# Patient Record
Sex: Male | Born: 1950
Health system: Southern US, Community
[De-identification: ages and names within clinical notes are randomized; demographics above are authoritative.]

## PROBLEM LIST (undated history)

## (undated) DIAGNOSIS — M1990 Unspecified osteoarthritis, unspecified site: Secondary | ICD-10-CM

## (undated) DIAGNOSIS — N200 Calculus of kidney: Secondary | ICD-10-CM

## (undated) DIAGNOSIS — Z9889 Other specified postprocedural states: Secondary | ICD-10-CM

## (undated) DIAGNOSIS — C61 Malignant neoplasm of prostate: Secondary | ICD-10-CM

## (undated) DIAGNOSIS — N181 Chronic kidney disease, stage 1: Secondary | ICD-10-CM

## (undated) DIAGNOSIS — E139 Other specified diabetes mellitus without complications: Secondary | ICD-10-CM

## (undated) DIAGNOSIS — C801 Malignant (primary) neoplasm, unspecified: Secondary | ICD-10-CM

## (undated) DIAGNOSIS — R112 Nausea with vomiting, unspecified: Secondary | ICD-10-CM

## (undated) DIAGNOSIS — Z978 Presence of other specified devices: Secondary | ICD-10-CM

## (undated) DIAGNOSIS — Z9641 Presence of insulin pump (external) (internal): Secondary | ICD-10-CM

## (undated) DIAGNOSIS — Z8551 Personal history of malignant neoplasm of bladder: Secondary | ICD-10-CM

## (undated) DIAGNOSIS — E785 Hyperlipidemia, unspecified: Secondary | ICD-10-CM

## (undated) DIAGNOSIS — M4802 Spinal stenosis, cervical region: Secondary | ICD-10-CM

## (undated) DIAGNOSIS — I10 Essential (primary) hypertension: Secondary | ICD-10-CM

## (undated) DIAGNOSIS — Z87442 Personal history of urinary calculi: Secondary | ICD-10-CM

## (undated) DIAGNOSIS — N401 Enlarged prostate with lower urinary tract symptoms: Secondary | ICD-10-CM

## (undated) DIAGNOSIS — N182 Chronic kidney disease, stage 2 (mild): Secondary | ICD-10-CM

## (undated) HISTORY — DX: Malignant (primary) neoplasm, unspecified: C80.1

## (undated) HISTORY — DX: Hyperlipidemia, unspecified: E78.5

## (undated) HISTORY — PX: EXTRACORPOREAL SHOCK WAVE LITHOTRIPSY: SHX1557

## (undated) HISTORY — PX: DENTAL SURGERY: SHX609

---

## 1973-07-18 HISTORY — PX: TONSILLECTOMY: SUR1361

## 1996-07-18 HISTORY — PX: TRANSURETHRAL RESECTION OF BLADDER TUMOR: SHX2575

## 1997-11-26 ENCOUNTER — Ambulatory Visit (HOSPITAL_COMMUNITY): Admission: RE | Admit: 1997-11-26 | Discharge: 1997-11-26 | Payer: Self-pay | Admitting: Urology

## 2002-09-20 ENCOUNTER — Ambulatory Visit (HOSPITAL_COMMUNITY): Admission: RE | Admit: 2002-09-20 | Discharge: 2002-09-20 | Payer: Self-pay | Admitting: Gastroenterology

## 2003-11-26 ENCOUNTER — Encounter: Admission: RE | Admit: 2003-11-26 | Discharge: 2004-02-24 | Payer: Self-pay | Admitting: Family Medicine

## 2004-08-27 ENCOUNTER — Ambulatory Visit: Payer: Self-pay | Admitting: Internal Medicine

## 2004-09-16 ENCOUNTER — Ambulatory Visit (HOSPITAL_COMMUNITY): Admission: RE | Admit: 2004-09-16 | Discharge: 2004-09-16 | Payer: Self-pay | Admitting: Urology

## 2005-02-28 ENCOUNTER — Ambulatory Visit (HOSPITAL_COMMUNITY): Admission: RE | Admit: 2005-02-28 | Discharge: 2005-02-28 | Payer: Self-pay | Admitting: Urology

## 2005-07-18 LAB — HM COLONOSCOPY

## 2007-03-01 ENCOUNTER — Ambulatory Visit (HOSPITAL_COMMUNITY): Admission: RE | Admit: 2007-03-01 | Discharge: 2007-03-01 | Payer: Self-pay | Admitting: Urology

## 2007-05-24 ENCOUNTER — Ambulatory Visit (HOSPITAL_COMMUNITY): Admission: RE | Admit: 2007-05-24 | Discharge: 2007-05-24 | Payer: Self-pay | Admitting: Urology

## 2007-11-08 ENCOUNTER — Ambulatory Visit (HOSPITAL_COMMUNITY): Admission: RE | Admit: 2007-11-08 | Discharge: 2007-11-08 | Payer: Self-pay | Admitting: Urology

## 2008-11-08 ENCOUNTER — Emergency Department (HOSPITAL_COMMUNITY): Admission: EM | Admit: 2008-11-08 | Discharge: 2008-11-08 | Payer: Self-pay | Admitting: Family Medicine

## 2009-03-12 LAB — PSA: PSA: 0.92

## 2009-07-04 ENCOUNTER — Emergency Department (HOSPITAL_COMMUNITY): Admission: EM | Admit: 2009-07-04 | Discharge: 2009-07-04 | Payer: Self-pay | Admitting: Emergency Medicine

## 2010-04-07 LAB — PSA: PSA: 1.23

## 2010-08-13 ENCOUNTER — Emergency Department (HOSPITAL_COMMUNITY)
Admission: EM | Admit: 2010-08-13 | Discharge: 2010-08-13 | Payer: Self-pay | Source: Home / Self Care | Admitting: Emergency Medicine

## 2010-08-13 LAB — BASIC METABOLIC PANEL
BUN: 9 mg/dL (ref 6–23)
CO2: 25 mEq/L (ref 19–32)
Calcium: 9.1 mg/dL (ref 8.4–10.5)
Chloride: 105 mEq/L (ref 96–112)
Creatinine, Ser: 0.78 mg/dL (ref 0.4–1.5)
GFR calc Af Amer: >60 mL/min (ref >60)
GFR calc non Af Amer: >60 mL/min (ref >60)
Glucose, Bld: 107 mg/dL — ABNORMAL HIGH (ref 70–99)
Potassium: 4.2 mEq/L (ref 3.5–5.1)
Sodium: 139 mEq/L (ref 135–145)

## 2010-08-13 LAB — DIFFERENTIAL
Basophils Absolute: 0.1 10*3/uL (ref 0.0–0.1)
Basophils Relative: 1 % (ref 0–1)
Eosinophils Absolute: 0.5 10*3/uL (ref 0.0–0.7)
Eosinophils Relative: 4 % (ref 0–5)
Lymphocytes Relative: 36 % (ref 12–46)
Lymphs Abs: 4.4 10*3/uL — ABNORMAL HIGH (ref 0.7–4.0)
Monocytes Absolute: 0.9 10*3/uL (ref 0.1–1.0)
Monocytes Relative: 8 % (ref 3–12)
Neutro Abs: 6.2 10*3/uL (ref 1.7–7.7)
Neutrophils Relative %: 51 % (ref 43–77)

## 2010-08-13 LAB — CBC
HCT: 42.8 % (ref 39.0–52.0)
Hemoglobin: 14.7 g/dL (ref 13.0–17.0)
MCH: 29.3 pg (ref 26.0–34.0)
MCHC: 34.3 g/dL (ref 30.0–36.0)
MCV: 85.3 fL (ref 78.0–100.0)
Platelets: 162 10*3/uL (ref 150–400)
RBC: 5.02 MIL/uL (ref 4.22–5.81)
RDW: 14.1 % (ref 11.5–15.5)
WBC: 12.1 10*3/uL — ABNORMAL HIGH (ref 4.0–10.5)

## 2010-08-16 LAB — WOUND CULTURE

## 2010-10-18 LAB — URINALYSIS, ROUTINE W REFLEX MICROSCOPIC
Bilirubin Urine: NEGATIVE
Glucose, UA: >1000 mg/dL — AB
Hgb urine dipstick: NEGATIVE
Ketones, ur: NEGATIVE mg/dL
Leukocytes, UA: NEGATIVE
Nitrite: NEGATIVE
Protein, ur: NEGATIVE mg/dL
Specific Gravity, Urine: 1.028 (ref 1.005–1.030)
Urobilinogen, UA: 1 mg/dL (ref 0.0–1.0)
pH: 8 (ref 5.0–8.0)

## 2010-10-18 LAB — CBC
HCT: 42.3 % (ref 39.0–52.0)
Hemoglobin: 14.3 g/dL (ref 13.0–17.0)
MCHC: 33.7 g/dL (ref 30.0–36.0)
MCV: 90.2 fL (ref 78.0–100.0)
Platelets: 151 10*3/uL (ref 150–400)
RBC: 4.69 MIL/uL (ref 4.22–5.81)
RDW: 14.8 % (ref 11.5–15.5)
WBC: 14.6 10*3/uL — ABNORMAL HIGH (ref 4.0–10.5)

## 2010-10-18 LAB — POCT I-STAT, CHEM 8
BUN: 17 mg/dL (ref 6–23)
Calcium, Ion: 1.21 mmol/L (ref 1.12–1.32)
Chloride: 105 mEq/L (ref 96–112)
Creatinine, Ser: 1.2 mg/dL (ref 0.4–1.5)
Glucose, Bld: 185 mg/dL — ABNORMAL HIGH (ref 70–99)
HCT: 45 % (ref 39.0–52.0)
Hemoglobin: 15.3 g/dL (ref 13.0–17.0)
Potassium: 3.9 mEq/L (ref 3.5–5.1)
Sodium: 141 mEq/L (ref 135–145)
TCO2: 29 mmol/L (ref 0–100)

## 2010-10-18 LAB — DIFFERENTIAL
Basophils Absolute: 0 10*3/uL (ref 0.0–0.1)
Basophils Relative: 0 % (ref 0–1)
Eosinophils Absolute: 0.1 10*3/uL (ref 0.0–0.7)
Eosinophils Relative: 1 % (ref 0–5)
Lymphocytes Relative: 5 % — ABNORMAL LOW (ref 12–46)
Lymphs Abs: 0.7 10*3/uL (ref 0.7–4.0)
Monocytes Absolute: 0.7 10*3/uL (ref 0.1–1.0)
Monocytes Relative: 5 % (ref 3–12)
Neutro Abs: 13.1 10*3/uL — ABNORMAL HIGH (ref 1.7–7.7)
Neutrophils Relative %: 90 % — ABNORMAL HIGH (ref 43–77)

## 2010-10-18 LAB — URINE MICROSCOPIC-ADD ON

## 2010-11-18 ENCOUNTER — Ambulatory Visit (HOSPITAL_COMMUNITY)
Admission: RE | Admit: 2010-11-18 | Discharge: 2010-11-18 | Disposition: A | Discharge status: Home / Self Care | Payer: BC Managed Care – PPO | Source: Ambulatory Visit | Attending: Urology | Admitting: Urology

## 2010-11-18 ENCOUNTER — Encounter (HOSPITAL_COMMUNITY): Payer: Self-pay

## 2010-11-18 DIAGNOSIS — N200 Calculus of kidney: Secondary | ICD-10-CM | POA: Insufficient documentation

## 2010-11-18 DIAGNOSIS — I10 Essential (primary) hypertension: Secondary | ICD-10-CM | POA: Insufficient documentation

## 2010-11-18 DIAGNOSIS — F17200 Nicotine dependence, unspecified, uncomplicated: Secondary | ICD-10-CM | POA: Insufficient documentation

## 2010-11-18 DIAGNOSIS — Z79899 Other long term (current) drug therapy: Secondary | ICD-10-CM | POA: Insufficient documentation

## 2010-11-18 DIAGNOSIS — E119 Type 2 diabetes mellitus without complications: Secondary | ICD-10-CM | POA: Insufficient documentation

## 2010-11-18 LAB — GLUCOSE, CAPILLARY: Glucose-Capillary: 211 mg/dL — ABNORMAL HIGH (ref 70–99)

## 2010-11-18 LAB — BASIC METABOLIC PANEL
BUN: 10 mg/dL (ref 6–23)
CO2: 26 mEq/L (ref 19–32)
Calcium: 8.9 mg/dL (ref 8.4–10.5)
Chloride: 100 mEq/L (ref 96–112)
Creatinine, Ser: 0.83 mg/dL (ref 0.4–1.5)
GFR calc Af Amer: >60 mL/min (ref >60)
GFR calc non Af Amer: >60 mL/min (ref >60)
Glucose, Bld: 213 mg/dL — ABNORMAL HIGH (ref 70–99)
Potassium: 3.7 mEq/L (ref 3.5–5.1)
Sodium: 135 mEq/L (ref 135–145)

## 2010-11-18 LAB — CBC
HCT: 41.2 % (ref 39.0–52.0)
Hemoglobin: 14.1 g/dL (ref 13.0–17.0)
MCH: 29.2 pg (ref 26.0–34.0)
MCHC: 34.2 g/dL (ref 30.0–36.0)
MCV: 85.3 fL (ref 78.0–100.0)
Platelets: 155 10*3/uL (ref 150–400)
RBC: 4.83 MIL/uL (ref 4.22–5.81)
RDW: 14.1 % (ref 11.5–15.5)
WBC: 10.4 10*3/uL (ref 4.0–10.5)

## 2010-12-23 ENCOUNTER — Ambulatory Visit (HOSPITAL_COMMUNITY)
Admission: RE | Admit: 2010-12-23 | Discharge: 2010-12-23 | Disposition: A | Discharge status: Home / Self Care | Payer: BC Managed Care – PPO | Source: Ambulatory Visit | Attending: Urology | Admitting: Urology

## 2010-12-23 DIAGNOSIS — E119 Type 2 diabetes mellitus without complications: Secondary | ICD-10-CM | POA: Insufficient documentation

## 2010-12-23 DIAGNOSIS — Z79899 Other long term (current) drug therapy: Secondary | ICD-10-CM | POA: Insufficient documentation

## 2010-12-23 DIAGNOSIS — N200 Calculus of kidney: Secondary | ICD-10-CM | POA: Insufficient documentation

## 2010-12-23 LAB — BASIC METABOLIC PANEL
BUN: 10 mg/dL (ref 6–23)
CO2: 26 mEq/L (ref 19–32)
Calcium: 8.9 mg/dL (ref 8.4–10.5)
Chloride: 103 mEq/L (ref 96–112)
Creatinine, Ser: 0.82 mg/dL (ref 0.4–1.5)
GFR calc Af Amer: >60 mL/min (ref >60)
GFR calc non Af Amer: >60 mL/min (ref >60)
Glucose, Bld: 324 mg/dL — ABNORMAL HIGH (ref 70–99)
Potassium: 4.3 mEq/L (ref 3.5–5.1)
Sodium: 135 mEq/L (ref 135–145)

## 2010-12-23 LAB — CBC
HCT: 41.3 % (ref 39.0–52.0)
Hemoglobin: 13.4 g/dL (ref 13.0–17.0)
MCH: 28.2 pg (ref 26.0–34.0)
MCHC: 32.4 g/dL (ref 30.0–36.0)
MCV: 86.8 fL (ref 78.0–100.0)
Platelets: 148 10*3/uL — ABNORMAL LOW (ref 150–400)
RBC: 4.76 MIL/uL (ref 4.22–5.81)
RDW: 14.3 % (ref 11.5–15.5)
WBC: 9.7 10*3/uL (ref 4.0–10.5)

## 2010-12-23 LAB — GLUCOSE, CAPILLARY: Glucose-Capillary: 297 mg/dL — ABNORMAL HIGH (ref 70–99)

## 2011-04-12 LAB — URINALYSIS, ROUTINE W REFLEX MICROSCOPIC
Bilirubin Urine: NEGATIVE
Glucose, UA: >1000 — AB
Hgb urine dipstick: NEGATIVE
Ketones, ur: NEGATIVE
Leukocytes, UA: NEGATIVE
Nitrite: NEGATIVE
Protein, ur: NEGATIVE
Specific Gravity, Urine: 1.026
Urobilinogen, UA: 0.2
pH: 6

## 2011-04-12 LAB — URINE MICROSCOPIC-ADD ON

## 2011-04-12 LAB — CBC
HCT: 40.3
Hemoglobin: 13.5
MCHC: 33.5
MCV: 90
Platelets: 147 — ABNORMAL LOW
RBC: 4.48
RDW: 15.2
WBC: 8

## 2011-04-12 LAB — BASIC METABOLIC PANEL
BUN: 10
CO2: 28
Calcium: 8.4
Chloride: 103
Creatinine, Ser: 0.99
GFR calc Af Amer: >60
GFR calc non Af Amer: >60
Glucose, Bld: 340 — ABNORMAL HIGH
Potassium: 4.7
Sodium: 134 — ABNORMAL LOW

## 2011-04-26 LAB — URINALYSIS, ROUTINE W REFLEX MICROSCOPIC
Bilirubin Urine: NEGATIVE
Glucose, UA: NEGATIVE
Ketones, ur: NEGATIVE
Leukocytes, UA: NEGATIVE
Nitrite: NEGATIVE
Protein, ur: NEGATIVE
Specific Gravity, Urine: 1.021
Urobilinogen, UA: 0.2
pH: 5.5

## 2011-04-26 LAB — CBC
HCT: 40.5
Hemoglobin: 13.7
MCHC: 33.9
MCV: 89.3
Platelets: 156
RBC: 4.53
RDW: 15 — ABNORMAL HIGH
WBC: 9

## 2011-04-26 LAB — URINE MICROSCOPIC-ADD ON

## 2011-04-26 LAB — BASIC METABOLIC PANEL
BUN: 11
CO2: 25
Calcium: 8.6
Chloride: 103
Creatinine, Ser: 0.89
GFR calc Af Amer: >60
GFR calc non Af Amer: >60
Glucose, Bld: 150 — ABNORMAL HIGH
Potassium: 4
Sodium: 136

## 2011-05-02 LAB — BASIC METABOLIC PANEL
BUN: 9
CO2: 27
Calcium: 9
Chloride: 110
Creatinine, Ser: 0.89
GFR calc Af Amer: >60
GFR calc non Af Amer: >60
Glucose, Bld: 113 — ABNORMAL HIGH
Potassium: 3.7
Sodium: 142

## 2011-05-02 LAB — URINALYSIS, MICROSCOPIC ONLY
Glucose, UA: 100 — AB
Nitrite: NEGATIVE
Protein, ur: NEGATIVE
Specific Gravity, Urine: 1.025
Urobilinogen, UA: 1
pH: 5.5

## 2011-05-02 LAB — CBC
HCT: 41.3
Hemoglobin: 14.1
MCHC: 34.1
MCV: 88.5
Platelets: 169
RBC: 4.67
RDW: 14.9 — ABNORMAL HIGH
WBC: 9.6

## 2011-08-03 LAB — HM DIABETES EYE EXAM: HM Diabetic Eye Exam: NORMAL

## 2011-09-16 ENCOUNTER — Other Ambulatory Visit (HOSPITAL_COMMUNITY): Payer: Self-pay | Admitting: Gynecology

## 2011-10-07 LAB — PSA: PSA: 1.54

## 2012-05-02 LAB — HM DIABETES FOOT EXAM

## 2012-06-26 ENCOUNTER — Encounter: Payer: Self-pay | Admitting: Internal Medicine

## 2012-06-26 ENCOUNTER — Ambulatory Visit (INDEPENDENT_AMBULATORY_CARE_PROVIDER_SITE_OTHER): Payer: BC Managed Care – PPO | Admitting: Internal Medicine

## 2012-06-26 ENCOUNTER — Other Ambulatory Visit (INDEPENDENT_AMBULATORY_CARE_PROVIDER_SITE_OTHER): Payer: BC Managed Care – PPO

## 2012-06-26 VITALS — BP 130/72 | HR 75 | Temp 98.7°F | Resp 16 | Ht 73.0 in | Wt 247.5 lb

## 2012-06-26 DIAGNOSIS — E119 Type 2 diabetes mellitus without complications: Secondary | ICD-10-CM

## 2012-06-26 DIAGNOSIS — Z0000 Encounter for general adult medical examination without abnormal findings: Secondary | ICD-10-CM

## 2012-06-26 DIAGNOSIS — E139 Other specified diabetes mellitus without complications: Secondary | ICD-10-CM

## 2012-06-26 DIAGNOSIS — Z23 Encounter for immunization: Secondary | ICD-10-CM

## 2012-06-26 LAB — PSA: PSA: 1.24 ng/mL (ref 0.10–4.00)

## 2012-06-26 LAB — COMPREHENSIVE METABOLIC PANEL
ALT: 23 U/L (ref 0–53)
AST: 19 U/L (ref 0–37)
Albumin: 3.7 g/dL (ref 3.5–5.2)
Alkaline Phosphatase: 108 U/L (ref 39–117)
BUN: 12 mg/dL (ref 6–23)
CO2: 26 mEq/L (ref 19–32)
Calcium: 8.8 mg/dL (ref 8.4–10.5)
Chloride: 105 mEq/L (ref 96–112)
Creatinine, Ser: 0.9 mg/dL (ref 0.4–1.5)
GFR: 116.74 mL/min (ref >60.00)
Glucose, Bld: 130 mg/dL — ABNORMAL HIGH (ref 70–99)
Potassium: 4.3 mEq/L (ref 3.5–5.1)
Sodium: 136 mEq/L (ref 135–145)
Total Bilirubin: 0.6 mg/dL (ref 0.3–1.2)
Total Protein: 6.8 g/dL (ref 6.0–8.3)

## 2012-06-26 LAB — URINALYSIS, ROUTINE W REFLEX MICROSCOPIC
Bilirubin Urine: NEGATIVE
Ketones, ur: NEGATIVE
Leukocytes, UA: NEGATIVE
Nitrite: NEGATIVE
Specific Gravity, Urine: 1.02 (ref 1.000–1.030)
Total Protein, Urine: NEGATIVE
Urine Glucose: NEGATIVE
Urobilinogen, UA: 0.2 (ref 0.0–1.0)
pH: 6 (ref 5.0–8.0)

## 2012-06-26 LAB — HEPATITIS B SURFACE ANTIBODY,QUALITATIVE: Hep B S Ab: NONREACTIVE

## 2012-06-26 LAB — CBC WITH DIFFERENTIAL/PLATELET
Basophils Absolute: 0.1 10*3/uL (ref 0.0–0.1)
Basophils Relative: 0.7 % (ref 0.0–3.0)
Eosinophils Absolute: 0.6 10*3/uL (ref 0.0–0.7)
Eosinophils Relative: 5.9 % — ABNORMAL HIGH (ref 0.0–5.0)
HCT: 43 % (ref 39.0–52.0)
Hemoglobin: 14.1 g/dL (ref 13.0–17.0)
Lymphocytes Relative: 26 % (ref 12.0–46.0)
Lymphs Abs: 2.6 10*3/uL (ref 0.7–4.0)
MCHC: 32.7 g/dL (ref 30.0–36.0)
MCV: 89.7 fl (ref 78.0–100.0)
Monocytes Absolute: 0.6 10*3/uL (ref 0.1–1.0)
Monocytes Relative: 6.5 % (ref 3.0–12.0)
Neutro Abs: 6.1 10*3/uL (ref 1.4–7.7)
Neutrophils Relative %: 60.9 % (ref 43.0–77.0)
Platelets: 179 10*3/uL (ref 150.0–400.0)
RBC: 4.8 Mil/uL (ref 4.22–5.81)
RDW: 14.8 % — ABNORMAL HIGH (ref 11.5–14.6)
WBC: 10 10*3/uL (ref 4.5–10.5)

## 2012-06-26 LAB — LIPID PANEL
Cholesterol: 169 mg/dL (ref 0–200)
HDL: 40.2 mg/dL (ref >39.00)
LDL Cholesterol: 112 mg/dL — ABNORMAL HIGH (ref 0–99)
Total CHOL/HDL Ratio: 4
Triglycerides: 84 mg/dL (ref 0.0–149.0)
VLDL: 16.8 mg/dL (ref 0.0–40.0)

## 2012-06-26 LAB — FECAL OCCULT BLOOD, GUAIAC: Fecal Occult Blood: NEGATIVE

## 2012-06-26 LAB — HEMOGLOBIN A1C: Hgb A1c MFr Bld: 8.8 % — ABNORMAL HIGH (ref 4.6–6.5)

## 2012-06-26 LAB — HM DIABETES FOOT EXAM: HM Diabetic Foot Exam: NORMAL

## 2012-06-26 LAB — TSH: TSH: 0.64 u[IU]/mL (ref 0.35–5.50)

## 2012-06-26 LAB — C-PEPTIDE: C-Peptide: <0.1 ng/mL — ABNORMAL LOW (ref 0.80–3.90)

## 2012-06-26 NOTE — Assessment & Plan Note (Signed)
I have referred him for an eye exam I will check his a1c, c-peptide level, and renal function today He will not take a statin due to muscle aches from prior statin therapy

## 2012-06-26 NOTE — Patient Instructions (Signed)
Diabetes, Type 1 Diabetes is a long-term (chronic) disease. It occurs when the cells in the pancreas that make insulin (a hormone) are destroyed and can no longer make insulin. Type 1 diabetes was also previously called juvenile-onset diabetes. It most often occurs before the age of 33, but it can also occur in older people. CAUSES  Among other factors, the following may cause type 1 diabetes:  Genetics. This means it may be passed to you by your parents.  The beta cells that make insulin are destroyed. The cause of this is unknown. SYMPTOMS   Urinating more than usual (or bed-wetting in children).  Drinking more than usual.  Irritability.  Feeling very hungry.  Weight loss (may be rapid).  Nausea and vomiting.  Abdominal pain.  Feeling more tired than usual (fatigue).  Rapid breathing.  Difficulty staying awake.  Night sweats. DIAGNOSIS  Your blood is tested to determine whether you have type 1 diabetes. TREATMENT   You will need to check your blood glucose (sugar) levels several times a day.  You will need to balance insulin, a healthy meal plan, and exercise to maintain normal blood glucose.  Education and ongoing support is recommended.  You should have regular checkups and immunizations. HOME CARE INSTRUCTIONS   Never run out of insulin. It is needed every day.  Do not skip insulin doses.  Do not skip meals. Eat healthy.  Follow your treatment and monitoring plan.  Wear a pendant or bracelet stating you have diabetes and take insulin.  If you start a new exercise or sport, watch for low blood glucose (hypoglycemia) symptoms. Insulin dosing may need to be adjusted.  Follow up with your caregiver regularly.  Tell your workplace or school about your diabetes treatment plan. SEEK MEDICAL CARE IF:   You have problems keeping your blood glucose in target range.  You have blood glucose readings that are often too high or too low.  You have problems with  your medicines.  You have symptoms of an illness that do not improve after 24 hours.  You have a sore or wound that is not healing.  You notice a change in vision or a new problem with your vision.  You have a fever. MAKE SURE YOU:  Understand these instructions.  Will watch your condition.  Will get help right away if you are not doing well or get worse. Document Released: 07/01/2000 Document Revised: 09/26/2011 Document Reviewed: 12/20/2010 [facility] Patient Information 2013 [place], [place]. Health Maintenance, Males A healthy lifestyle and preventative care can promote health and wellness.  Maintain regular health, dental, and eye exams.  Eat a healthy diet. Foods like vegetables, fruits, whole grains, low-fat dairy products, and lean protein foods contain the nutrients you need without too many calories. Decrease your intake of foods high in solid fats, added sugars, and salt. Get information about a proper diet from your caregiver, if necessary.  Regular physical exercise is one of the most important things you can do for your health. Most adults should get at least 150 minutes of moderate-intensity exercise (any activity that increases your heart rate and causes you to sweat) each week. In addition, most adults need muscle-strengthening exercises on 2 or more days a week.   Maintain a healthy weight. The body mass index (BMI) is a screening tool to identify possible weight problems. It provides an estimate of body fat based on height and weight. Your caregiver can help determine your BMI, and can help you achieve or maintain  a healthy weight. For adults 20 years and older:  A BMI below 18.5 is considered underweight.  A BMI of 18.5 to 24.9 is normal.  A BMI of 25 to 29.9 is considered overweight.  A BMI of 30 and above is considered obese.  Maintain normal blood lipids and cholesterol by exercising and minimizing your intake of saturated fat. Eat a balanced diet with plenty  of fruits and vegetables. Blood tests for lipids and cholesterol should begin at age 21 and be repeated every 5 years. If your lipid or cholesterol levels are high, you are over 50, or you are a high risk for heart disease, you may need your cholesterol levels checked more frequently.Ongoing high lipid and cholesterol levels should be treated with medicines, if diet and exercise are not effective.  If you smoke, find out from your caregiver how to quit. If you do not use tobacco, do not start.  If you choose to drink alcohol, do not exceed 2 drinks per day. One drink is considered to be 12 ounces (355 mL) of beer, 5 ounces (148 mL) of wine, or 1.5 ounces (44 mL) of liquor.  Avoid use of street drugs. Do not share needles with anyone. Ask for help if you need support or instructions about stopping the use of drugs.  High blood pressure causes heart disease and increases the risk of stroke. Blood pressure should be checked at least every 1 to 2 years. Ongoing high blood pressure should be treated with medicines if weight loss and exercise are not effective.  If you are 38 to 59 years old, ask your caregiver if you should take aspirin to prevent heart disease.  Diabetes screening involves taking a blood sample to check your fasting blood sugar level. This should be done once every 3 years, after age 38, if you are within normal weight and without risk factors for diabetes. Testing should be considered at a younger age or be carried out more frequently if you are overweight and have at least 1 risk factor for diabetes.  Colorectal cancer can be detected and often prevented. Most routine colorectal cancer screening begins at the age of 16 and continues through age 56. However, your caregiver may recommend screening at an earlier age if you have risk factors for colon cancer. On a yearly basis, your caregiver may provide home test kits to check for hidden blood in the stool. Use of a small camera at the end  of a tube, to directly examine the colon (sigmoidoscopy or colonoscopy), can detect the earliest forms of colorectal cancer. Talk to your caregiver about this at age 62, when routine screening begins. Direct examination of the colon should be repeated every 5 to 10 years through age 40, unless early forms of pre-cancerous polyps or small growths are found.  Hepatitis C blood testing is recommended for all people born from [birth year] through 1963 and any individual with known risks for hepatitis C.  Healthy men should no longer receive prostate-specific antigen (PSA) blood tests as part of routine cancer screening. Consult with your caregiver about prostate cancer screening.  Testicular cancer screening is not recommended for adolescents or adult males who have no symptoms. Screening includes self-exam, caregiver exam, and other screening tests. Consult with your caregiver about any symptoms you have or any concerns you have about testicular cancer.  Practice safe sex. Use condoms and avoid high-risk sexual practices to reduce the spread of sexually transmitted infections (STIs).  Use sunscreen with a sun  protection factor (SPF) of 30 or greater. Apply sunscreen liberally and repeatedly throughout the day. You should seek shade when your shadow is shorter than you. Protect yourself by wearing long sleeves, pants, a wide-brimmed hat, and sunglasses year round, whenever you are outdoors.  Notify your caregiver of new moles or changes in moles, especially if there is a change in shape or color. Also notify your caregiver if a mole is larger than the size of a pencil eraser.  A one-time screening for abdominal aortic aneurysm (AAA) and surgical repair of large AAAs by sound wave imaging (ultrasonography) is recommended for ages 71 to 78 years who are current or former smokers.  Stay current with your immunizations. Document Released: 12/31/2007 Document Revised: 09/26/2011 Document Reviewed:  11/29/2010 [facility] Patient Information 2013 [place], [place].

## 2012-06-26 NOTE — Progress Notes (Signed)
Subjective:    [name], male    DOB: 12-15-1950, 61 y.o.   MRN: [id number]  Diabetes He presents for his follow-up diabetic visit. He has type 1 diabetes mellitus. The initial diagnosis of diabetes was made 15 years ago. His disease course has been stable. There are no hypoglycemic associated symptoms. Pertinent negatives for hypoglycemia include no pallor. Pertinent negatives for diabetes include no blurred vision, no chest pain, no fatigue, no foot paresthesias, no foot ulcerations, no polydipsia, no polyphagia, no polyuria, no visual change, no weakness and no weight loss. There are no hypoglycemic complications. Symptoms are stable. Diabetic complications include impotence. Current diabetic treatment includes intensive insulin program and insulin injections. His weight is stable. He is following a generally healthy diet. Meal planning includes avoidance of concentrated sweets. He has not had a previous visit with a dietician. He participates in exercise intermittently. There is no change in his home blood glucose trend. An ACE inhibitor/angiotensin II receptor blocker is being taken. He does not see a podiatrist.Eye exam is current.      Review of Systems  Constitutional: Negative for fever, chills, weight loss, diaphoresis, activity change, appetite change, fatigue and unexpected weight change.  HENT: Negative.   Eyes: Negative.  Negative for blurred vision.  Respiratory: Negative for cough, chest tightness, shortness of breath, wheezing and stridor.   Cardiovascular: Negative for chest pain, palpitations and leg swelling.  Gastrointestinal: Negative for nausea, vomiting, abdominal pain, diarrhea, constipation and blood in stool.  Genitourinary: Positive for impotence. Negative for polyuria.  Musculoskeletal: Negative for myalgias, back pain, joint swelling, arthralgias and gait problem.  Skin: Negative for color change, pallor, rash and wound.  Neurological: Negative.   Negative for weakness.  Hematological: Negative for polydipsia, polyphagia and adenopathy. Does not bruise/bleed easily.  Psychiatric/Behavioral: Negative.        Objective:   Physical Exam  Vitals reviewed. Constitutional: He is oriented to person, place, and time. He appears well-developed and well-nourished. No distress.  HENT:  Head: Normocephalic and atraumatic.  Mouth/Throat: Oropharynx is clear and moist. No oropharyngeal exudate.  Eyes: Conjunctivae normal are normal. Right eye exhibits no discharge. Left eye exhibits no discharge. No scleral icterus.  Neck: Normal range of motion. Neck supple. No JVD present. No tracheal deviation present. No thyromegaly present.  Cardiovascular: Normal rate, regular rhythm and normal heart sounds.  Exam reveals no gallop and no friction rub.   No murmur heard. Pulmonary/Chest: Effort normal and breath sounds normal. No stridor. No respiratory distress. He has no wheezes. He has no rales. He exhibits no tenderness.  Abdominal: Soft. Bowel sounds are normal. He exhibits no distension and no mass. There is no tenderness. There is no rebound and no guarding. Hernia confirmed negative in the right inguinal area and confirmed negative in the left inguinal area.  Genitourinary: Prostate normal, testes normal and penis normal. Rectal exam shows external hemorrhoid and internal hemorrhoid. Rectal exam shows no fissure, no mass, no tenderness and anal tone normal. Guaiac negative stool. Prostate is not enlarged and not tender. Right testis shows no mass, no swelling and no tenderness. Right testis is descended. Left testis shows no mass, no swelling and no tenderness. Left testis is descended. Circumcised. No penile erythema or penile tenderness. No discharge found.  Musculoskeletal: Normal range of motion. He exhibits no edema and no tenderness.  Lymphadenopathy:    He has no cervical adenopathy.       Right: No inguinal adenopathy present.  Left: No  inguinal adenopathy present.  Neurological: He is alert and oriented to person, place, and time. He has normal reflexes. He displays normal reflexes. No cranial nerve deficit. He exhibits normal muscle tone. Coordination normal.  Skin: Skin is warm and dry. No rash noted. He is not diaphoretic. No erythema. No pallor.  Psychiatric: He has a normal mood and affect. His behavior is normal. Judgment and thought content normal.     Lab Results  Component Value Date   WBC 9.7 12/23/2010   HGB 13.4 12/23/2010   HCT 41.3 12/23/2010   PLT 148* 12/23/2010   GLUCOSE 324* 12/23/2010   NA 135 12/23/2010   K 4.3 12/23/2010   CL 103 12/23/2010   CREATININE 0.82 12/23/2010   BUN 10 12/23/2010   CO2 26 12/23/2010       Assessment & Plan:

## 2012-06-26 NOTE — Assessment & Plan Note (Signed)
Exam done He wants to be screened for Hep A and Hep B and possibly vaccinated if necessary Vaccines were reviewed and given as indicated Labs ordered Pt ed material was given

## 2012-06-27 LAB — HEPATITIS A ANTIBODY, TOTAL: Hep A Total Ab: NEGATIVE

## 2012-06-28 ENCOUNTER — Encounter: Payer: Self-pay | Admitting: Internal Medicine

## 2012-08-25 ENCOUNTER — Emergency Department (HOSPITAL_COMMUNITY)
Admission: EM | Admit: 2012-08-25 | Discharge: 2012-08-25 | Disposition: A | Discharge status: Home / Self Care | Payer: BC Managed Care – PPO | Attending: Emergency Medicine | Admitting: Emergency Medicine

## 2012-08-25 ENCOUNTER — Encounter (HOSPITAL_COMMUNITY): Payer: Self-pay | Admitting: *Deleted

## 2012-08-25 ENCOUNTER — Emergency Department (HOSPITAL_COMMUNITY): Payer: BC Managed Care – PPO

## 2012-08-25 DIAGNOSIS — Z8639 Personal history of other endocrine, nutritional and metabolic disease: Secondary | ICD-10-CM | POA: Insufficient documentation

## 2012-08-25 DIAGNOSIS — Z794 Long term (current) use of insulin: Secondary | ICD-10-CM | POA: Insufficient documentation

## 2012-08-25 DIAGNOSIS — K388 Other specified diseases of appendix: Secondary | ICD-10-CM

## 2012-08-25 DIAGNOSIS — Z7982 Long term (current) use of aspirin: Secondary | ICD-10-CM | POA: Insufficient documentation

## 2012-08-25 DIAGNOSIS — Z862 Personal history of diseases of the blood and blood-forming organs and certain disorders involving the immune mechanism: Secondary | ICD-10-CM | POA: Insufficient documentation

## 2012-08-25 DIAGNOSIS — N200 Calculus of kidney: Secondary | ICD-10-CM

## 2012-08-25 DIAGNOSIS — K389 Disease of appendix, unspecified: Secondary | ICD-10-CM | POA: Insufficient documentation

## 2012-08-25 DIAGNOSIS — Z8551 Personal history of malignant neoplasm of bladder: Secondary | ICD-10-CM | POA: Insufficient documentation

## 2012-08-25 DIAGNOSIS — Z79899 Other long term (current) drug therapy: Secondary | ICD-10-CM | POA: Insufficient documentation

## 2012-08-25 DIAGNOSIS — F17200 Nicotine dependence, unspecified, uncomplicated: Secondary | ICD-10-CM | POA: Insufficient documentation

## 2012-08-25 DIAGNOSIS — E119 Type 2 diabetes mellitus without complications: Secondary | ICD-10-CM | POA: Insufficient documentation

## 2012-08-25 LAB — URINALYSIS, ROUTINE W REFLEX MICROSCOPIC
Bilirubin Urine: NEGATIVE
Glucose, UA: 100 mg/dL — AB
Hgb urine dipstick: NEGATIVE
Ketones, ur: >80 mg/dL — AB
Leukocytes, UA: NEGATIVE
Nitrite: NEGATIVE
Protein, ur: 100 mg/dL — AB
Specific Gravity, Urine: 1.024 (ref 1.005–1.030)
Urobilinogen, UA: 0.2 mg/dL (ref 0.0–1.0)
pH: 6.5 (ref 5.0–8.0)

## 2012-08-25 LAB — COMPREHENSIVE METABOLIC PANEL
ALT: 15 U/L (ref 0–53)
AST: 13 U/L (ref 0–37)
Albumin: 3.6 g/dL (ref 3.5–5.2)
Alkaline Phosphatase: 114 U/L (ref 39–117)
BUN: 11 mg/dL (ref 6–23)
CO2: 24 mEq/L (ref 19–32)
Calcium: 8.6 mg/dL (ref 8.4–10.5)
Chloride: 100 mEq/L (ref 96–112)
Creatinine, Ser: 0.8 mg/dL (ref 0.50–1.35)
GFR calc Af Amer: >90 mL/min (ref >90)
GFR calc non Af Amer: >90 mL/min (ref >90)
Glucose, Bld: 117 mg/dL — ABNORMAL HIGH (ref 70–99)
Potassium: 4.2 mEq/L (ref 3.5–5.1)
Sodium: 135 mEq/L (ref 135–145)
Total Bilirubin: 0.4 mg/dL (ref 0.3–1.2)
Total Protein: 7.2 g/dL (ref 6.0–8.3)

## 2012-08-25 LAB — CBC WITH DIFFERENTIAL/PLATELET
Basophils Absolute: 0 10*3/uL (ref 0.0–0.1)
Basophils Relative: 0 % (ref 0–1)
Eosinophils Absolute: 0 10*3/uL (ref 0.0–0.7)
Eosinophils Relative: 0 % (ref 0–5)
HCT: 43.9 % (ref 39.0–52.0)
Hemoglobin: 15.2 g/dL (ref 13.0–17.0)
Lymphocytes Relative: 14 % (ref 12–46)
Lymphs Abs: 1.8 10*3/uL (ref 0.7–4.0)
MCH: 29.8 pg (ref 26.0–34.0)
MCHC: 34.6 g/dL (ref 30.0–36.0)
MCV: 86.1 fL (ref 78.0–100.0)
Monocytes Absolute: 0.5 10*3/uL (ref 0.1–1.0)
Monocytes Relative: 4 % (ref 3–12)
Neutro Abs: 11.1 10*3/uL — ABNORMAL HIGH (ref 1.7–7.7)
Neutrophils Relative %: 82 % — ABNORMAL HIGH (ref 43–77)
Platelets: 172 10*3/uL (ref 150–400)
RBC: 5.1 MIL/uL (ref 4.22–5.81)
RDW: 14 % (ref 11.5–15.5)
WBC: 13.4 10*3/uL — ABNORMAL HIGH (ref 4.0–10.5)

## 2012-08-25 LAB — URINE MICROSCOPIC-ADD ON

## 2012-08-25 LAB — GLUCOSE, CAPILLARY: Glucose-Capillary: 92 mg/dL (ref 70–99)

## 2012-08-25 MED ORDER — SODIUM CHLORIDE 0.9 % IV BOLUS (SEPSIS)
1000.0000 mL | Freq: Once | INTRAVENOUS | Status: AC
  Administered 2012-08-25: 1000 mL via INTRAVENOUS

## 2012-08-25 MED ORDER — OXYCODONE-ACETAMINOPHEN 5-325 MG PO TABS
2.0000 | ORAL_TABLET | Freq: Once | ORAL | Status: AC
  Administered 2012-08-25: 2 via ORAL

## 2012-08-25 MED ORDER — MORPHINE SULFATE 4 MG/ML IJ SOLN
4.0000 mg | Freq: Once | INTRAMUSCULAR | Status: AC
  Administered 2012-08-25: 4 mg via INTRAVENOUS
  Filled 2012-08-25: qty 1

## 2012-08-25 MED ORDER — OXYCODONE-ACETAMINOPHEN 5-325 MG PO TABS: 2.0000 | ORAL_TABLET | ORAL | Status: DC | PRN

## 2012-08-25 MED ORDER — OXYCODONE-ACETAMINOPHEN 5-325 MG PO TABS
ORAL_TABLET | ORAL | Status: AC
  Filled 2012-08-25: qty 2

## 2012-08-25 NOTE — ED Provider Notes (Signed)
History     CSN: [id number]  Arrival date & time 08/25/12  1626   First MD Initiated Contact with Patient 08/25/12 1712      Chief Complaint  Patient presents with  . Flank Pain    (Consider location/radiation/quality/duration/timing/severity/associated sxs/prior treatment) The history is provided by the patient.  Anthony Daniel is a 62 y.o. male hx of DM, HL, kidney stones here with ab pain. LLQ pain that is sharp with acute onset at 5 am that woke him up from sleep. No flank pain. No dysuria or hematuria. Pain is intermittent since then. Took hydrocodone without improvement. + nausea. No constipation or diarrhea. He said that it is slightly different than his kidney stones, which are usually flank pain.    Past Medical History  Diagnosis Date  . Diabetes mellitus without complication   . Hyperlipidemia   . Cancer 1996    bladder cancer  . Kidney stones     Past Surgical History  Procedure Laterality Date  . Tonsillectomy  1975  . Bladder surgery      Family History  Problem Relation Age of Onset  . Cancer Other     Colon and Prostate Cancer  . Hypertension Other   . Diabetes Other   . Alcohol abuse Neg Hx   . Drug abuse Neg Hx   . Early death Neg Hx   . Heart disease Neg Hx   . Hyperlipidemia Neg Hx   . Kidney disease Neg Hx   . Stroke Neg Hx     History  Substance Use Topics  . Smoking status: Current Every Day Smoker -- 1.00 packs/day for 30 years    Types: Cigarettes  . Smokeless tobacco: Never Used  . Alcohol Use: 3.0 oz/week    5 Shots of liquor per week      Review of Systems  Gastrointestinal: Positive for abdominal pain.    Allergies  Crestor; Lipitor; and Contrast media  Home Medications   Current Outpatient Rx  Name  Route  Sig  Dispense  Refill  . aspirin 81 MG tablet   Oral   Take 81 mg by mouth daily.         . cholecalciferol (VITAMIN D) 1000 UNITS tablet   Oral   Take 1,000 Units by mouth daily.         . Insulin  Glargine (LANTUS SOLOSTAR [place])   Subcutaneous   Inject 34 Units into the skin 2 (two) times daily.          . Potassium Citrate 15 MEQ (1620 MG) TBCR   Oral   Take 1 tablet by mouth 2 (two) times daily.         . ramipril (ALTACE) 10 MG tablet   Oral   Take 10 mg by mouth daily before breakfast.          . tadalafil (CIALIS) 20 MG tablet   Oral   Take 20 mg by mouth daily as needed for erectile dysfunction.         . Tamsulosin HCl (FLOMAX) 0.4 MG CAPS   Oral   Take 0.4 mg by mouth daily.          . vitamin B-12 (CYANOCOBALAMIN) 500 MCG tablet   Oral   Take 500 mcg by mouth daily.           BP 164/87  Pulse 69  Temp(Src) 98.9 F (37.2 C) (Oral)  Resp 16  SpO2 98%  Physical Exam  Nursing note and vitals reviewed. Constitutional: He is oriented to person, place, and time. He appears well-nourished.  Slightly uncomfortable   HENT:  Head: Normocephalic.  Mouth/Throat: Oropharynx is clear and moist.  Eyes: Conjunctivae are normal. Pupils are equal, round, and reactive to light.  Neck: Normal range of motion. Neck supple.  Cardiovascular: Normal rate, regular rhythm and normal heart sounds.   Pulmonary/Chest: Effort normal and breath sounds normal. No respiratory distress. He has no wheezes. He has no rales. He exhibits no tenderness.  Abdominal: Soft. Bowel sounds are normal.  Mild LLQ tenderness, no rebound. No CVAT    Musculoskeletal: Normal range of motion.  Neurological: He is alert and oriented to person, place, and time.  Skin: Skin is warm and dry.  Psychiatric: He has a normal mood and affect. His behavior is normal. Judgment and thought content normal.    ED Course  Procedures (including critical care time)  Labs Reviewed  URINALYSIS, ROUTINE W REFLEX MICROSCOPIC - Abnormal; Notable for the following:    Glucose, UA 100 (*)    Ketones, ur >80 (*)    Protein, ur 100 (*)    All other components within normal limits  CBC WITH DIFFERENTIAL -  Abnormal; Notable for the following:    WBC 13.4 (*)    Neutrophils Relative 82 (*)    Neutro Abs 11.1 (*)    All other components within normal limits  COMPREHENSIVE METABOLIC PANEL - Abnormal; Notable for the following:    Glucose, Bld 117 (*)    All other components within normal limits  URINE MICROSCOPIC-ADD ON  GLUCOSE, CAPILLARY   Ct Abdomen Pelvis Wo Contrast  08/25/2012  *RADIOLOGY REPORT*  Clinical Data: Left lower quadrant pain  CT ABDOMEN AND PELVIS WITHOUT CONTRAST  Technique:  Multidetector CT imaging of the abdomen and pelvis was performed following the standard protocol without intravenous contrast.  Comparison: 07/04/2009  Findings: Lung bases are essentially clear.  Unenhanced liver, spleen, pancreas, and adrenal glands are within normal limits.  Gallbladder is unremarkable.  No intrahepatic or extrahepatic ductal dilatation.  Numerous bilateral nonobstructing renal calculi measuring up to 6 mm in the bilateral lower poles.  No hydronephrosis.  No evidence of bowel obstruction.  Appendix is dilated, measuring up to 1.9 cm (series 2/image 56), although previously 1.4 cm in 2010.  No definite periappendiceal stranding.  Left colon is decompressed.  No colonic wall thickening or inflammatory changes.  Atherosclerotic calcifications of the abdominal aorta and branch vessels.  No abdominopelvic ascites.  No suspicious abdominopelvic lymphadenopathy.  Prostate is unremarkable.  No ureteral or bladder calculi.  Visualized osseous structures are within normal limits.  IMPRESSION: No evidence of diverticulitis.  Numerous bilateral nonobstructing renal calculi.  No ureteral or bladder calculi.  No hydronephrosis.  Abnormal appendix, measuring 1.9 cm in diameter, without definite associated inflammatory changes.  This appearance suggests an appendiceal mucocele, less likely a mucinous neoplasm.  These results were called by telephone on 08/25/2012 at 2140 hours to Dr. Chaney Malling, who verbally  acknowledged these results.   Original Report Authenticated By: Charline Bills, M.D.      No diagnosis found.    MDM  Anthony Daniel is a 62 y.o. male here with LLQ pain. Possibly stone vs diverticulitis. Will check UA, if +hematuria, will do stone hunt. Otherwise, will do CT to r/o diverticulitis. Will hydrate, give pain meds and reassess.   10:34 PM No hematuria. CT ab/pel showed small kidney stones. Appendix dilated at 2 cm,  likely mucocele. Patient has nontender abdomen after pain meds. Tolerated PO in the ED. I called surgeon on call, Dr. Daphine Deutscher, who felt that he will need a malignancy workup with colonoscopy then will need to proceed with appendectomy nonemergently. I think he may have passed some stones. Will d/c home with prn percocet, urology f/u. He is already on flomax.         Richardean Canal, MD 08/25/12 2236

## 2012-08-25 NOTE — ED Notes (Signed)
Pt woke with pain in lower abd, has history of kidney stones, feels it may be a stone, can not tell which side is causing the pain.

## 2012-08-25 NOTE — ED Notes (Signed)
Pt in no acute distress and drinking oral contrast.

## 2012-08-25 NOTE — ED Notes (Signed)
Per Dr. Silverio Lay, pt given some crackers and peanut butter.

## 2012-08-25 NOTE — ED Notes (Signed)
md at bedside  Pt alert and oriented x4. Respirations even and unlabored, bilateral symmetrical rise and fall of chest. Skin warm and dry. In no acute distress. Denies needs.   

## 2012-08-31 ENCOUNTER — Encounter (INDEPENDENT_AMBULATORY_CARE_PROVIDER_SITE_OTHER): Payer: Self-pay | Admitting: Surgery

## 2012-08-31 ENCOUNTER — Ambulatory Visit (INDEPENDENT_AMBULATORY_CARE_PROVIDER_SITE_OTHER): Payer: BC Managed Care – PPO | Admitting: Surgery

## 2012-08-31 VITALS — BP 140/84 | Temp 97.2°F | Ht 73.0 in | Wt 244.0 lb

## 2012-08-31 DIAGNOSIS — K388 Other specified diseases of appendix: Secondary | ICD-10-CM

## 2012-08-31 DIAGNOSIS — K389 Disease of appendix, unspecified: Secondary | ICD-10-CM

## 2012-08-31 NOTE — Progress Notes (Signed)
[name], male   DOB: [birth year], 62 y.o.   MRN: [id number]  Chief Complaint  Patient presents with  . Other    dilated appendix, possible mucocel    HPI Anthony Daniel is a 62 y.o. male.   HPI This is a very pleasant gentleman with a history of kidney stones who was found on a recent CAT scan to have a possible mucocele of the appendix. The right-sided abdominal pain he was having during the CAT scan has resolved. He is currently asymptomatic. His last colonoscopy was partially 9 years ago and was negative. He currently has no complaints. Past Medical History  Diagnosis Date  . Diabetes mellitus without complication   . Hyperlipidemia   . Cancer 1996    bladder cancer  . Kidney stones     Past Surgical History  Procedure Laterality Date  . Tonsillectomy  1975  . Bladder surgery      Family History  Problem Relation Age of Onset  . Cancer Other     Colon and Prostate Cancer  . Hypertension Other   . Diabetes Other   . Alcohol abuse Neg Hx   . Drug abuse Neg Hx   . Early death Neg Hx   . Heart disease Neg Hx   . Hyperlipidemia Neg Hx   . Kidney disease Neg Hx   . Stroke Neg Hx     Social History History  Substance Use Topics  . Smoking status: Not on file  . Smokeless tobacco: Never Used  . Alcohol Use: 3.0 oz/week    5 Shots of liquor per week    Allergies  Allergen Reactions  . Crestor (Rosuvastatin)     Muscle aches  . Lipitor (Atorvastatin)     Muscle aches  . Contrast Media (Iodinated Diagnostic Agents)     PT TRIED THIS TWICE.EVEN WITH PRE-MEDS AND STILL HAD NAUSEA AND ITCHING--WAS TOLD HE CAN NOT TAKE iv ANYMORE    Current Outpatient Prescriptions  Medication Sig Dispense Refill  . aspirin 81 MG tablet Take 81 mg by mouth daily.      . cholecalciferol (VITAMIN D) 1000 UNITS tablet Take 1,000 Units by mouth daily.      . Insulin Glargine (LANTUS SOLOSTAR [place]) Inject 34 Units into the skin 2 (two) times daily.       Marland Kitchen  oxyCODONE-acetaminophen (PERCOCET) 5-325 MG per tablet Take 2 tablets by mouth every 4 (four) hours as needed for pain.  20 tablet  0  . Potassium Citrate 15 MEQ (1620 MG) TBCR Take 1 tablet by mouth 2 (two) times daily.      . ramipril (ALTACE) 10 MG tablet Take 10 mg by mouth daily before breakfast.       . tadalafil (CIALIS) 20 MG tablet Take 20 mg by mouth daily as needed for erectile dysfunction.      . Tamsulosin HCl (FLOMAX) 0.4 MG CAPS Take 0.4 mg by mouth daily.       . vitamin B-12 (CYANOCOBALAMIN) 500 MCG tablet Take 500 mcg by mouth daily.       No current facility-administered medications for this visit.    Review of Systems Review of Systems  Constitutional: Negative for fever, chills and unexpected weight change.  HENT: Negative for hearing loss, congestion, sore throat, trouble swallowing and voice change.   Eyes: Negative for visual disturbance.  Respiratory: Negative for cough and wheezing.   Cardiovascular: Negative for chest pain, palpitations and leg swelling.  Gastrointestinal:  Negative for nausea, vomiting, abdominal pain, diarrhea, constipation, blood in stool, abdominal distention, anal bleeding and rectal pain.  Genitourinary: Negative for hematuria and difficulty urinating.  Musculoskeletal: Negative for arthralgias.  Skin: Negative for rash and wound.  Neurological: Negative for seizures, syncope, weakness and headaches.  Hematological: Negative for adenopathy. Does not bruise/bleed easily.  Psychiatric/Behavioral: Negative for confusion.    Blood pressure 140/84, temperature 97.2 F (36.2 C), height 6\' 1"  (1.854 m), weight 244 lb (110.678 kg).  Physical Exam Physical Exam  Constitutional: He is oriented to person, place, and time. He appears well-developed and well-nourished. No distress.  HENT:  Head: Normocephalic and atraumatic.  Right Ear: External ear normal.  Left Ear: External ear normal.  Nose: Nose normal.  Mouth/Throat: Oropharynx is clear  and moist. No oropharyngeal exudate.  Eyes: Conjunctivae are normal. Pupils are equal, round, and reactive to light. Right eye exhibits no discharge. Left eye exhibits no discharge. No scleral icterus.  Neck: Normal range of motion. Neck supple. No tracheal deviation present. No thyromegaly present.  Cardiovascular: Normal rate, regular rhythm, normal heart sounds and intact distal pulses.   No murmur heard. Pulmonary/Chest: Effort normal and breath sounds normal. No respiratory distress. He has no wheezes.  Abdominal: Soft. Bowel sounds are normal. He exhibits no distension. There is no tenderness. There is no rebound.  Musculoskeletal: Normal range of motion. He exhibits no edema.  Lymphadenopathy:    He has no cervical adenopathy.  Neurological: He is alert and oriented to person, place, and time.  Skin: Skin is warm and dry. No rash noted. He is not diaphoretic. No erythema.  Psychiatric: His behavior is normal. Judgment normal.    Data Reviewed I reviewed his CAT scan of the abdomen and pelvis  Assessment    Mucocele of the appendix     Plan    Laparoscopic appendectomy is recommended. I explained him the reasonings behind proceeding with an appendectomy given the CT findings. I explained the need to rule out malignancy of the appendix. I discussed the surgical procedure in detail. I discussed the risk of the procedure which includes but is not limited to bleeding, infection, injury to surrounding structures, need to convert to an open procedure, findings of malignancy with me for further surgery, et Karie Soda. He understands and wished to proceed. Surgery will thus be scheduled. Likelihood of success is good        Sophiana Milanese A 08/31/2012, 3:35 PM

## 2012-09-11 ENCOUNTER — Encounter (HOSPITAL_COMMUNITY): Payer: Self-pay | Admitting: Pharmacy Technician

## 2012-09-18 NOTE — Pre-Procedure Instructions (Signed)
DIETRICK BARRIS  09/18/2012    Your procedure is scheduled on:  Wednesday, March 12th.  Report to [facility] at 7:45AM.  Call this number if you have problems the morning of surgery: [phone]   Remember:   Do not eat food or drink liquids after midnight.   Take these medicines the morning of surgery with A SIP OF WATER: Tamsulosin (Flomax) .  May take Oxycodone- Acetaminophen (Percocet) if needed.  Stop taking Aspirin, Coumadin, Plavix, Effient and Herbal medications.  Do not take any NSAIDs ie: Ibuprofen,  Advil,Naproxen or any medication containing Aspirin.   Do not wear jewelry, make-up or nail polish.  Do not wear lotions, powders, or perfumes. You may wear deodorant.  Do not shave 48 hours prior to surgery. Men may shave face and neck.  Do not bring valuables to the hospital.  Contacts, dentures or bridgework may not be worn into surgery.  Leave suitcase in the car. After surgery it may be brought to your room.  For patients admitted to the hospital, checkout time is 11:00 AM the day of  discharge.   Patients discharged the day of surgery will not be allowed to drive  home.  Name and phone number of your driver: -   Special Instructions: Shower using CHG 2 nights before surgery and the night before surgery.  If you shower the day of surgery use CHG.  Use special wash - you have one bottle of CHG for all showers.  You should use approximately 1/3 of the bottle for each shower.   Please read over the following fact sheets that you were given: Pain Booklet, Coughing and Deep Breathing and Surgical Site Infection Prevention

## 2012-09-19 ENCOUNTER — Encounter (HOSPITAL_COMMUNITY): Payer: Self-pay

## 2012-09-19 ENCOUNTER — Encounter (HOSPITAL_COMMUNITY)
Admission: RE | Admit: 2012-09-19 | Discharge: 2012-09-19 | Disposition: A | Discharge status: Home / Self Care | Payer: BC Managed Care – PPO | Source: Ambulatory Visit | Attending: Surgery | Admitting: Surgery

## 2012-09-19 ENCOUNTER — Encounter (HOSPITAL_COMMUNITY)
Admission: RE | Admit: 2012-09-19 | Discharge: 2012-09-19 | Disposition: A | Discharge status: Home / Self Care | Payer: BC Managed Care – PPO | Source: Ambulatory Visit | Attending: Anesthesiology | Admitting: Anesthesiology

## 2012-09-19 DIAGNOSIS — Z01810 Encounter for preprocedural cardiovascular examination: Secondary | ICD-10-CM | POA: Insufficient documentation

## 2012-09-19 DIAGNOSIS — Z01812 Encounter for preprocedural laboratory examination: Secondary | ICD-10-CM | POA: Insufficient documentation

## 2012-09-19 HISTORY — DX: Essential (primary) hypertension: I10

## 2012-09-19 LAB — BASIC METABOLIC PANEL
BUN: 13 mg/dL (ref 6–23)
CO2: 27 mEq/L (ref 19–32)
Calcium: 9.5 mg/dL (ref 8.4–10.5)
Chloride: 103 mEq/L (ref 96–112)
Creatinine, Ser: 0.81 mg/dL (ref 0.50–1.35)
GFR calc Af Amer: >90 mL/min (ref >90)
GFR calc non Af Amer: >90 mL/min (ref >90)
Glucose, Bld: 124 mg/dL — ABNORMAL HIGH (ref 70–99)
Potassium: 4.9 mEq/L (ref 3.5–5.1)
Sodium: 139 mEq/L (ref 135–145)

## 2012-09-19 LAB — CBC
HCT: 42.9 % (ref 39.0–52.0)
Hemoglobin: 15.3 g/dL (ref 13.0–17.0)
MCH: 30.7 pg (ref 26.0–34.0)
MCHC: 35.7 g/dL (ref 30.0–36.0)
MCV: 86 fL (ref 78.0–100.0)
Platelets: 176 10*3/uL (ref 150–400)
RBC: 4.99 MIL/uL (ref 4.22–5.81)
RDW: 14.3 % (ref 11.5–15.5)
WBC: 11 10*3/uL — ABNORMAL HIGH (ref 4.0–10.5)

## 2012-09-19 LAB — SURGICAL PCR SCREEN
MRSA, PCR: NEGATIVE
Staphylococcus aureus: POSITIVE — AB

## 2012-09-20 ENCOUNTER — Telehealth (INDEPENDENT_AMBULATORY_CARE_PROVIDER_SITE_OTHER): Payer: Self-pay | Admitting: General Surgery

## 2012-09-20 ENCOUNTER — Encounter (HOSPITAL_COMMUNITY): Payer: Self-pay | Admitting: Vascular Surgery

## 2012-09-20 NOTE — Progress Notes (Signed)
Anesthesia chart review: Patient is a 62 year old male scheduled for laparoscopic appendectomy by Dr. Magnus Ivan on 09/26/2012.  History includes smoking, obesity, bladder cancer '96, nephrolithiasis with lithotripsy '12, DM type 1 diagnosed 15 years ago, ED, tonsillectomy '75.  He is on ACEI therapy.  PCP is Dr. Duanne Guess indicates he has an appointment with Dr. Yetta Barre on 09/24/12.      Preoperative labs noted.  Glucose was 124.    CXR on 09/19/12 showed no active disease.  EKG on 09/19/12 showed NSR, cannot rule out anterior infarct (age undetermined).  New lateral T wave abnormality in V5-V6 since 11/18/10.    I reviewed above with Anesthesiologist Dr. Krista Blue.  Due to EKG changes and CAD risk factors, patient will need medical clearance from Dr. Yetta Barre.  Will defer additional pre-operative testing orders, if any, to Dr. Yetta Barre.  I have notifed Dr. Magnus Ivan and also routed EKG to Dr. Yetta Barre with request for clearance.  Triage nurse Britta Mccreedy at [facility] also plans to contact Mr. Mitton to stress the importance of keeping his 09/24/12 appointment with Dr. Yetta Barre for follow-up and preoperative clearance.  Velna [facility]/Anesthesiology Phone [phone] 09/20/2012 1210

## 2012-09-20 NOTE — Telephone Encounter (Signed)
After call from [facility], anesthesia PA, pt instructed to be sure to attend the appt with Dr. Yetta Barre on March 10, as he has been asked to provide medical clearance for surgery with Dr. Magnus Ivan.  Apparently anesthesia has detected "subtle changes" on his ECG and want him cleared for surgery.  Pt assured he would attend the appt.

## 2012-09-24 ENCOUNTER — Encounter (INDEPENDENT_AMBULATORY_CARE_PROVIDER_SITE_OTHER): Payer: Self-pay | Admitting: General Surgery

## 2012-09-24 ENCOUNTER — Ambulatory Visit (INDEPENDENT_AMBULATORY_CARE_PROVIDER_SITE_OTHER): Payer: BC Managed Care – PPO | Admitting: Internal Medicine

## 2012-09-24 ENCOUNTER — Encounter: Payer: Self-pay | Admitting: Internal Medicine

## 2012-09-24 ENCOUNTER — Telehealth (INDEPENDENT_AMBULATORY_CARE_PROVIDER_SITE_OTHER): Payer: Self-pay | Admitting: General Surgery

## 2012-09-24 VITALS — BP 134/80 | HR 83 | Temp 98.3°F | Resp 16 | Wt 248.0 lb

## 2012-09-24 DIAGNOSIS — R9431 Abnormal electrocardiogram [ECG] [EKG]: Secondary | ICD-10-CM | POA: Insufficient documentation

## 2012-09-24 DIAGNOSIS — Z23 Encounter for immunization: Secondary | ICD-10-CM

## 2012-09-24 DIAGNOSIS — E108 Type 1 diabetes mellitus with unspecified complications: Secondary | ICD-10-CM

## 2012-09-24 NOTE — Progress Notes (Signed)
  Subjective:    [name], male    DOB: 14-Oct-1950, 62 y.o.   MRN: [id number]  HPI  He comes in today after having a pre-op EKG done last week that showed inverted T- waves in V5 and V6. His only prior EKG was in 2012 and it was normal. He has not had any chest pain, SOB, DOE, edema, diaphoresis, or fatigue.   Review of Systems  Constitutional: Negative for fever, chills, diaphoresis, activity change, appetite change, fatigue and unexpected weight change.  HENT: Negative.   Eyes: Negative.   Respiratory: Negative for apnea, cough, choking, chest tightness, shortness of breath, wheezing and stridor.   Cardiovascular: Negative for chest pain, palpitations and leg swelling.  Gastrointestinal: Negative.   Endocrine: Negative.   Genitourinary: Negative.   Musculoskeletal: Negative.   Skin: Negative.   Allergic/Immunologic: Negative.   Neurological: Negative.  Negative for dizziness, weakness and light-headedness.  Hematological: Negative.   Psychiatric/Behavioral: Negative.        Objective:   Physical Exam  Vitals reviewed. Constitutional: He is oriented to person, place, and time. He appears well-developed and well-nourished. No distress.  HENT:  Head: Normocephalic and atraumatic.  Mouth/Throat: Oropharynx is clear and moist. No oropharyngeal exudate.  Eyes: Conjunctivae are normal. Right eye exhibits no discharge. Left eye exhibits no discharge. No scleral icterus.  Neck: Normal range of motion. Neck supple. No JVD present. No tracheal deviation present. No thyromegaly present.  Cardiovascular: Normal rate, regular rhythm, normal heart sounds and intact distal pulses.  Exam reveals no gallop and no friction rub.   No murmur heard. Pulmonary/Chest: Effort normal and breath sounds normal. No stridor. No respiratory distress. He has no wheezes. He has no rales. He exhibits no tenderness.  Abdominal: Soft. Bowel sounds are normal. He exhibits no distension and no  mass. There is no tenderness. There is no rebound and no guarding.  Musculoskeletal: Normal range of motion. He exhibits no edema and no tenderness.  Lymphadenopathy:    He has no cervical adenopathy.  Neurological: He is oriented to person, place, and time.  Skin: Skin is warm and dry. No rash noted. He is not diaphoretic. No erythema. No pallor.  Psychiatric: He has a normal mood and affect. His behavior is normal. Judgment and thought content normal.      Lab Results  Component Value Date   WBC 11.0* 09/19/2012   HGB 15.3 09/19/2012   HCT 42.9 09/19/2012   PLT 176 09/19/2012   GLUCOSE 124* 09/19/2012   CHOL 169 06/26/2012   TRIG 84.0 06/26/2012   HDL 40.20 06/26/2012   LDLCALC 112* 06/26/2012   ALT 15 08/25/2012   AST 13 08/25/2012   NA 139 09/19/2012   K 4.9 09/19/2012   CL 103 09/19/2012   CREATININE 0.81 09/19/2012   BUN 13 09/19/2012   CO2 27 09/19/2012   TSH 0.64 06/26/2012   PSA 1.24 06/26/2012   HGBA1C 8.8* 06/26/2012      Assessment & Plan:

## 2012-09-24 NOTE — Assessment & Plan Note (Signed)
His EKG today is back to normal today so the T wave inversion may have been lead placement. He has several risk factors for CAD so I have asked him to hold on the surgery for now. I have asked him to undergo an ETT to screen for CAD.

## 2012-09-24 NOTE — Telephone Encounter (Signed)
Called patient back to see who he see has a cardiology. He stated that Dr Yetta Barre was going to schedule the apt. I told patient that we will cancel his surgery on 09-26-12, that I needed to have clearance for surgery, I left a message at Dr Yetta Barre office to call me back to let me know if and when he is seeing a cardiology or do I need to schedule him a apt. I forward a message to Eunice Blase E in scheduling to cancel the surgery on the 3-12. I told patient that we will be talking about once I hear or I need to schedule his clearance

## 2012-09-24 NOTE — Patient Instructions (Signed)
Diabetes, Type 1 Diabetes is a long-term (chronic) disease. It occurs when the cells in the pancreas that make insulin (a hormone) are destroyed and can no longer make insulin. Type 1 diabetes was also previously called juvenile-onset diabetes. It most often occurs before the age of 30, but it can also occur in older people. CAUSES  Among other factors, the following may cause type 1 diabetes:  Genetics. This means it may be passed to you by your parents.  The beta cells that make insulin are destroyed. The cause of this is unknown. SYMPTOMS   Urinating more than usual (or bed-wetting in children).  Drinking more than usual.  Irritability.  Feeling very hungry.  Weight loss (may be rapid).  Nausea and vomiting.  Abdominal pain.  Feeling more tired than usual (fatigue).  Rapid breathing.  Difficulty staying awake.  Night sweats. DIAGNOSIS  Your blood is tested to determine whether you have type 1 diabetes. TREATMENT   You will need to check your blood glucose (sugar) levels several times a day.  You will need to balance insulin, a healthy meal plan, and exercise to maintain normal blood glucose.  Education and ongoing support is recommended.  You should have regular checkups and immunizations. HOME CARE INSTRUCTIONS   Never run out of insulin. It is needed every day.  Do not skip insulin doses.  Do not skip meals. Eat healthy.  Follow your treatment and monitoring plan.  Wear a pendant or bracelet stating you have diabetes and take insulin.  If you start a new exercise or sport, watch for low blood glucose (hypoglycemia) symptoms. Insulin dosing may need to be adjusted.  Follow up with your caregiver regularly.  Tell your workplace or school about your diabetes treatment plan. SEEK MEDICAL CARE IF:   You have problems keeping your blood glucose in target range.  You have blood glucose readings that are often too high or too low.  You have problems with  your medicines.  You have symptoms of an illness that do not improve after 24 hours.  You have a sore or wound that is not healing.  You notice a change in vision or a new problem with your vision.  You have a fever. MAKE SURE YOU:  Understand these instructions.  Will watch your condition.  Will get help right away if you are not doing well or get worse. Document Released: 07/01/2000 Document Revised: 09/26/2011 Document Reviewed: 12/20/2010 [employer] Patient Information 2013 [employer].  

## 2012-09-26 ENCOUNTER — Ambulatory Visit (HOSPITAL_COMMUNITY): Admission: RE | Admit: 2012-09-26 | Payer: BC Managed Care – PPO | Source: Ambulatory Visit | Admitting: Surgery

## 2012-09-26 ENCOUNTER — Encounter (HOSPITAL_COMMUNITY): Admission: RE | Payer: Self-pay | Source: Ambulatory Visit

## 2012-09-26 SURGERY — APPENDECTOMY, LAPAROSCOPIC
Anesthesia: General

## 2012-10-03 ENCOUNTER — Ambulatory Visit (INDEPENDENT_AMBULATORY_CARE_PROVIDER_SITE_OTHER): Payer: BC Managed Care – PPO | Admitting: Physician Assistant

## 2012-10-03 DIAGNOSIS — R9431 Abnormal electrocardiogram [ECG] [EKG]: Secondary | ICD-10-CM

## 2012-10-03 NOTE — Progress Notes (Signed)
Anthony Daniel is a 62 y.o. male referred by PCP for ETT due to an abnormal ECG.  He was noted to have an abnormal ECG during pre-op testing for an upcoming appendectomy.  He has a hx of DM2, HTN, HL.  He is an active smoker.  No FHx of CAD.  He denies recent CP, dyspnea, syncope.   Exam unremarkable.   Exercise Treadmill Test  Pre-Exercise Testing Evaluation Rhythm: normal sinus  Rate: 88                 Test  Exercise Tolerance Test Ordering MD: Sanda Linger, MD  Interpreting MD: Tereso Newcomer, PA-C  Unique Test No: 1  Treadmill:  1  Indication for ETT: Abnormal EKG, Surgical Clearance  Contraindication to ETT: No   Stress Modality: exercise - treadmill  Cardiac Imaging Performed: non   Protocol: standard Bruce - maximal  Max BP:  206/56  Max MPHR (bpm):  160 85% MPR (bpm):  136  MPHR obtained (bpm):  164 % MPHR obtained:  102%  Reached 85% MPHR (min:sec):  2:37 Total Exercise Time (min-sec):  5:00  Workload in METS:  6.8 Borg Scale: 15  Reason ETT Terminated:  desired heart rate attained    ST Segment Analysis At Rest: non-specific ST segment slurring with inf TWI With Exercise: no evidence of significant ST depression  Other Information Arrhythmia:  Occasional PVCs during exercise Angina during ETT:  absent (0) Quality of ETT:  diagnostic  ETT Interpretation:  normal - no evidence of ischemia by ST analysis  Comments: Fair exercise tolerance. No chest pain. Normal BP response to exercise. No ST-T changes to suggest ischemia.   Recommendations: Follow up with Sanda Linger, MD as directed. SignedTereso Newcomer, PA-C  10:05 AM 10/03/2012

## 2012-10-15 ENCOUNTER — Ambulatory Visit (INDEPENDENT_AMBULATORY_CARE_PROVIDER_SITE_OTHER): Payer: BC Managed Care – PPO | Admitting: Surgery

## 2012-10-15 VITALS — BP 136/80 | HR 72 | Temp 98.0°F | Resp 18 | Ht 73.0 in | Wt 251.0 lb

## 2012-10-15 DIAGNOSIS — K388 Other specified diseases of appendix: Secondary | ICD-10-CM

## 2012-10-15 DIAGNOSIS — K389 Disease of appendix, unspecified: Secondary | ICD-10-CM

## 2012-10-15 NOTE — Progress Notes (Signed)
Subjective:     [name], male   DOB: 1950/12/09, 62 y.o.   MRN: [id number]  HPI His original surgery for laparoscopic appendectomy was canceled secondary to EKG changes. He has since had a workup which was negative. He'll be seeing Dr. Yetta Barre in early April. He has no bowel complaints  Review of Systems     Objective:   Physical Exam On exam, his abdomen is soft and nontender    Assessment:     Mucocele of the appendix     Plan:     [place] appendectomy will again be scheduled

## 2012-10-23 LAB — HEMOGLOBIN A1C: Hgb A1c MFr Bld: 8.1 % — AB (ref 4.0–6.0)

## 2012-10-25 ENCOUNTER — Encounter (HOSPITAL_COMMUNITY): Payer: Self-pay | Admitting: Pharmacy Technician

## 2012-10-31 ENCOUNTER — Encounter (HOSPITAL_COMMUNITY)
Admission: RE | Admit: 2012-10-31 | Discharge: 2012-10-31 | Disposition: A | Discharge status: Home / Self Care | Payer: BC Managed Care – PPO | Source: Ambulatory Visit | Attending: Surgery | Admitting: Surgery

## 2012-10-31 ENCOUNTER — Encounter (HOSPITAL_COMMUNITY): Payer: Self-pay

## 2012-10-31 LAB — BASIC METABOLIC PANEL
BUN: 12 mg/dL (ref 6–23)
CO2: 27 mEq/L (ref 19–32)
Calcium: 8.9 mg/dL (ref 8.4–10.5)
Chloride: 105 mEq/L (ref 96–112)
Creatinine, Ser: 0.86 mg/dL (ref 0.50–1.35)
GFR calc Af Amer: >90 mL/min (ref >90)
GFR calc non Af Amer: >90 mL/min (ref >90)
Glucose, Bld: 169 mg/dL — ABNORMAL HIGH (ref 70–99)
Potassium: 5 mEq/L (ref 3.5–5.1)
Sodium: 139 mEq/L (ref 135–145)

## 2012-10-31 LAB — CBC
HCT: 41.7 % (ref 39.0–52.0)
Hemoglobin: 14.8 g/dL (ref 13.0–17.0)
MCH: 30.1 pg (ref 26.0–34.0)
MCHC: 35.5 g/dL (ref 30.0–36.0)
MCV: 84.9 fL (ref 78.0–100.0)
Platelets: 166 10*3/uL (ref 150–400)
RBC: 4.91 MIL/uL (ref 4.22–5.81)
RDW: 14.5 % (ref 11.5–15.5)
WBC: 10.2 10*3/uL (ref 4.0–10.5)

## 2012-10-31 LAB — SURGICAL PCR SCREEN
MRSA, PCR: NEGATIVE
Staphylococcus aureus: NEGATIVE

## 2012-10-31 NOTE — Progress Notes (Signed)
10/31/12 0929  OBSTRUCTIVE SLEEP APNEA  Score 4 or greater  Results sent to PCP

## 2012-10-31 NOTE — Pre-Procedure Instructions (Addendum)
Anthony Daniel  10/31/2012   Your procedure is scheduled on: 11/07/12  Report to [facility] at 630 AM.  Call this number if you have problems the morning of surgery: [phone]   Remember:   Do not eat food or drink liquids after midnight.   Take these medicines the morning of surgery with A SIP OF WATER:pain med, flomax  STOP aspirin, any nsaids, herbal meds, blood thinners now   Do not wear jewelry, make-up or nail polish.  Do not wear lotions, powders, or perfumes. You may wear deodorant.  Do not shave 48 hours prior to surgery. Men may shave face and neck.  Do not bring valuables to the hospital.  Contacts, dentures or bridgework may not be worn into surgery.  Leave suitcase in the car. After surgery it may be brought to your room.  For patients admitted to the hospital, checkout time is 11:00 AM the day of  discharge.   Patients discharged the day of surgery will not be allowed to drive  home.  Name and phone number of your driver:  Special Instructions: Shower using CHG 2 nights before surgery and the night before surgery.  If you shower the day of surgery use CHG.  Use special wash - you have one bottle of CHG for all showers.  You should use approximately 1/3 of the bottle for each shower.   Please read over the following fact sheets that you were given: Pain Booklet, Coughing and Deep Breathing, MRSA Information and Surgical Site Infection Prevention

## 2012-11-05 ENCOUNTER — Encounter: Payer: Self-pay | Admitting: Internal Medicine

## 2012-11-05 ENCOUNTER — Ambulatory Visit (INDEPENDENT_AMBULATORY_CARE_PROVIDER_SITE_OTHER): Payer: BC Managed Care – PPO | Admitting: Internal Medicine

## 2012-11-05 VITALS — BP 128/78 | HR 76 | Temp 98.0°F | Resp 16 | Wt 248.8 lb

## 2012-11-05 DIAGNOSIS — E785 Hyperlipidemia, unspecified: Secondary | ICD-10-CM

## 2012-11-05 DIAGNOSIS — E108 Type 1 diabetes mellitus with unspecified complications: Secondary | ICD-10-CM

## 2012-11-05 DIAGNOSIS — Z23 Encounter for immunization: Secondary | ICD-10-CM

## 2012-11-05 MED ORDER — PITAVASTATIN CALCIUM 2 MG PO TABS: 1.0000 | ORAL_TABLET | Freq: Every day | ORAL | Status: DC

## 2012-11-05 NOTE — Patient Instructions (Addendum)

## 2012-11-05 NOTE — Assessment & Plan Note (Signed)
He saw Dr. Sharl Ma 2 weeks ago

## 2012-11-05 NOTE — Assessment & Plan Note (Signed)
I have asked him to try livalo 

## 2012-11-05 NOTE — Progress Notes (Signed)
  Subjective:    [name], male    DOB: March 23, 1951, 62 y.o.   MRN: [id number]  HPI  He returns for f/up and he offers no complaints.  Review of Systems  Constitutional: Negative.   HENT: Negative.   Eyes: Negative.   Respiratory: Negative.  Negative for cough, chest tightness, shortness of breath, wheezing and stridor.   Cardiovascular: Negative.  Negative for chest pain, palpitations and leg swelling.  Gastrointestinal: Negative.  Negative for nausea, vomiting, abdominal pain, diarrhea, constipation and blood in stool.  Endocrine: Negative.   Genitourinary: Negative.   Musculoskeletal: Negative.  Negative for myalgias, back pain, joint swelling, arthralgias and gait problem.  Skin: Negative.  Negative for color change, pallor, rash and wound.  Allergic/Immunologic: Negative.   Neurological: Negative.  Negative for dizziness.  Hematological: Negative.  Negative for adenopathy. Does not bruise/bleed easily.  Psychiatric/Behavioral: Negative.        Objective:   Physical Exam  Vitals reviewed. Constitutional: He is oriented to person, place, and time. He appears well-developed and well-nourished. No distress.  HENT:  Head: Normocephalic and atraumatic.  Mouth/Throat: Oropharynx is clear and moist. No oropharyngeal exudate.  Eyes: Conjunctivae are normal. Right eye exhibits no discharge. Left eye exhibits no discharge. No scleral icterus.  Neck: Normal range of motion. Neck supple. No JVD present. No tracheal deviation present. No thyromegaly present.  Cardiovascular: Normal rate, regular rhythm, normal heart sounds and intact distal pulses.  Exam reveals no gallop and no friction rub.   No murmur heard. Pulmonary/Chest: Effort normal and breath sounds normal. No stridor. No respiratory distress. He has no wheezes. He has no rales. He exhibits no tenderness.  Abdominal: Soft. Bowel sounds are normal. He exhibits no distension and no mass. There is no tenderness.  There is no rebound and no guarding.  Musculoskeletal: Normal range of motion. He exhibits no edema and no tenderness.  Lymphadenopathy:    He has no cervical adenopathy.  Neurological: He is oriented to person, place, and time.  Skin: Skin is warm and dry. No rash noted. He is not diaphoretic. No erythema. No pallor.  Psychiatric: He has a normal mood and affect. His behavior is normal. Judgment and thought content normal.      Lab Results  Component Value Date   WBC 10.2 10/31/2012   HGB 14.8 10/31/2012   HCT 41.7 10/31/2012   PLT 166 10/31/2012   GLUCOSE 169* 10/31/2012   CHOL 169 06/26/2012   TRIG 84.0 06/26/2012   HDL 40.20 06/26/2012   LDLCALC 112* 06/26/2012   ALT 15 08/25/2012   AST 13 08/25/2012   NA 139 10/31/2012   K 5.0 10/31/2012   CL 105 10/31/2012   CREATININE 0.86 10/31/2012   BUN 12 10/31/2012   CO2 27 10/31/2012   TSH 0.64 06/26/2012   PSA 1.24 06/26/2012   HGBA1C 8.1* 10/23/2012      Assessment & Plan:

## 2012-11-06 MED ORDER — DEXTROSE 5 % IV SOLN
2.0000 g | INTRAVENOUS | Status: AC
  Administered 2012-11-07: 2 g via INTRAVENOUS
  Filled 2012-11-06: qty 2

## 2012-11-07 ENCOUNTER — Encounter (HOSPITAL_COMMUNITY): Payer: Self-pay | Admitting: Anesthesiology

## 2012-11-07 ENCOUNTER — Ambulatory Visit (HOSPITAL_COMMUNITY): Payer: BC Managed Care – PPO | Admitting: Anesthesiology

## 2012-11-07 ENCOUNTER — Encounter (HOSPITAL_COMMUNITY): Payer: Self-pay | Admitting: *Deleted

## 2012-11-07 ENCOUNTER — Encounter (HOSPITAL_COMMUNITY)
Admission: RE | Disposition: A | Discharge status: Home / Self Care | Payer: Self-pay | Source: Ambulatory Visit | Attending: Surgery

## 2012-11-07 ENCOUNTER — Observation Stay (HOSPITAL_COMMUNITY)
Admission: RE | Admit: 2012-11-07 | Discharge: 2012-11-07 | Disposition: A | Discharge status: Home / Self Care | Payer: BC Managed Care – PPO | Source: Ambulatory Visit | Attending: Surgery | Admitting: Surgery

## 2012-11-07 DIAGNOSIS — E119 Type 2 diabetes mellitus without complications: Secondary | ICD-10-CM | POA: Insufficient documentation

## 2012-11-07 DIAGNOSIS — K389 Disease of appendix, unspecified: Secondary | ICD-10-CM

## 2012-11-07 DIAGNOSIS — I10 Essential (primary) hypertension: Secondary | ICD-10-CM | POA: Insufficient documentation

## 2012-11-07 DIAGNOSIS — D126 Benign neoplasm of colon, unspecified: Secondary | ICD-10-CM | POA: Insufficient documentation

## 2012-11-07 DIAGNOSIS — Z01812 Encounter for preprocedural laboratory examination: Secondary | ICD-10-CM | POA: Insufficient documentation

## 2012-11-07 HISTORY — PX: LAPAROSCOPIC APPENDECTOMY: SHX408

## 2012-11-07 LAB — GLUCOSE, CAPILLARY
Glucose-Capillary: 122 mg/dL — ABNORMAL HIGH (ref 70–99)
Glucose-Capillary: 127 mg/dL — ABNORMAL HIGH (ref 70–99)
Glucose-Capillary: 136 mg/dL — ABNORMAL HIGH (ref 70–99)

## 2012-11-07 SURGERY — APPENDECTOMY, LAPAROSCOPIC
Anesthesia: General | Wound class: Contaminated

## 2012-11-07 MED ORDER — 0.9 % SODIUM CHLORIDE (POUR BTL) OPTIME
TOPICAL | Status: DC | PRN
  Administered 2012-11-07: 1000 mL

## 2012-11-07 MED ORDER — ONDANSETRON HCL 4 MG/2ML IJ SOLN: 4.0000 mg | Freq: Four times a day (QID) | INTRAMUSCULAR | Status: DC | PRN

## 2012-11-07 MED ORDER — MEPERIDINE HCL 25 MG/ML IJ SOLN: 6.2500 mg | INTRAMUSCULAR | Status: DC | PRN

## 2012-11-07 MED ORDER — LACTATED RINGERS IV SOLN
INTRAVENOUS | Status: DC | PRN
  Administered 2012-11-07: 08:00:00 via INTRAVENOUS

## 2012-11-07 MED ORDER — ASPIRIN EC 81 MG PO TBEC
81.0000 mg | DELAYED_RELEASE_TABLET | Freq: Every day | ORAL | Status: DC
  Administered 2012-11-07: 81 mg via ORAL
  Filled 2012-11-07: qty 1

## 2012-11-07 MED ORDER — ONDANSETRON HCL 4 MG PO TABS: 4.0000 mg | ORAL_TABLET | Freq: Four times a day (QID) | ORAL | Status: DC | PRN

## 2012-11-07 MED ORDER — HYDROMORPHONE HCL PF 1 MG/ML IJ SOLN
0.2500 mg | INTRAMUSCULAR | Status: DC | PRN
  Administered 2012-11-07 (×2): 0.5 mg via INTRAVENOUS

## 2012-11-07 MED ORDER — OXYCODONE HCL 5 MG/5ML PO SOLN: 5.0000 mg | Freq: Once | ORAL | Status: DC | PRN

## 2012-11-07 MED ORDER — OXYCODONE HCL 5 MG PO TABS: 5.0000 mg | ORAL_TABLET | Freq: Once | ORAL | Status: DC | PRN

## 2012-11-07 MED ORDER — MIDAZOLAM HCL 5 MG/5ML IJ SOLN
INTRAMUSCULAR | Status: DC | PRN
  Administered 2012-11-07: 2 mg via INTRAVENOUS

## 2012-11-07 MED ORDER — ENOXAPARIN SODIUM 40 MG/0.4ML ~~LOC~~ SOLN: 40.0000 mg | SUBCUTANEOUS | Status: DC

## 2012-11-07 MED ORDER — NEOSTIGMINE METHYLSULFATE 1 MG/ML IJ SOLN
INTRAMUSCULAR | Status: DC | PRN
  Administered 2012-11-07: 3 mg via INTRAVENOUS

## 2012-11-07 MED ORDER — OXYCODONE-ACETAMINOPHEN 5-325 MG PO TABS: 1.0000 | ORAL_TABLET | ORAL | Status: DC | PRN

## 2012-11-07 MED ORDER — INSULIN ASPART 100 UNIT/ML ~~LOC~~ SOLN
0.0000 [IU] | SUBCUTANEOUS | Status: DC
  Administered 2012-11-07: 2 [IU] via SUBCUTANEOUS

## 2012-11-07 MED ORDER — PROPOFOL 10 MG/ML IV BOLUS
INTRAVENOUS | Status: DC | PRN
  Administered 2012-11-07: 200 mg via INTRAVENOUS

## 2012-11-07 MED ORDER — LACTATED RINGERS IV SOLN: INTRAVENOUS | Status: DC

## 2012-11-07 MED ORDER — LIDOCAINE HCL (CARDIAC) 20 MG/ML IV SOLN
INTRAVENOUS | Status: DC | PRN
  Administered 2012-11-07: 80 mg via INTRAVENOUS

## 2012-11-07 MED ORDER — PROMETHAZINE HCL 25 MG/ML IJ SOLN: 6.2500 mg | INTRAMUSCULAR | Status: DC | PRN

## 2012-11-07 MED ORDER — IBUPROFEN 600 MG PO TABS: 600.0000 mg | ORAL_TABLET | Freq: Four times a day (QID) | ORAL | Status: DC | PRN

## 2012-11-07 MED ORDER — BUPIVACAINE-EPINEPHRINE 0.5% -1:200000 IJ SOLN
INTRAMUSCULAR | Status: DC | PRN
  Administered 2012-11-07: 20 mL

## 2012-11-07 MED ORDER — ROCURONIUM BROMIDE 100 MG/10ML IV SOLN
INTRAVENOUS | Status: DC | PRN
  Administered 2012-11-07: 40 mg via INTRAVENOUS

## 2012-11-07 MED ORDER — ONDANSETRON HCL 4 MG/2ML IJ SOLN
INTRAMUSCULAR | Status: DC | PRN
  Administered 2012-11-07: 4 mg via INTRAVENOUS

## 2012-11-07 MED ORDER — EPHEDRINE SULFATE 50 MG/ML IJ SOLN
INTRAMUSCULAR | Status: DC | PRN
  Administered 2012-11-07: 5 mg via INTRAVENOUS
  Administered 2012-11-07: 10 mg via INTRAVENOUS

## 2012-11-07 MED ORDER — HYDROMORPHONE HCL PF 1 MG/ML IJ SOLN
INTRAMUSCULAR | Status: AC
  Filled 2012-11-07: qty 1

## 2012-11-07 MED ORDER — SODIUM CHLORIDE 0.9 % IR SOLN
Status: DC | PRN
  Administered 2012-11-07: 1000 mL

## 2012-11-07 MED ORDER — GLYCOPYRROLATE 0.2 MG/ML IJ SOLN
INTRAMUSCULAR | Status: DC | PRN
  Administered 2012-11-07: 0.4 mg via INTRAVENOUS

## 2012-11-07 MED ORDER — MORPHINE SULFATE 4 MG/ML IJ SOLN: 4.0000 mg | INTRAMUSCULAR | Status: DC | PRN

## 2012-11-07 MED ORDER — TAMSULOSIN HCL 0.4 MG PO CAPS
0.4000 mg | ORAL_CAPSULE | Freq: Every day | ORAL | Status: DC
  Administered 2012-11-07: 0.4 mg via ORAL
  Filled 2012-11-07: qty 1

## 2012-11-07 MED ORDER — RAMIPRIL 5 MG PO CAPS
10.0000 mg | ORAL_CAPSULE | Freq: Every day | ORAL | Status: DC
  Filled 2012-11-07: qty 2

## 2012-11-07 MED ORDER — FENTANYL CITRATE 0.05 MG/ML IJ SOLN
INTRAMUSCULAR | Status: DC | PRN
  Administered 2012-11-07: 50 ug via INTRAVENOUS
  Administered 2012-11-07: 150 ug via INTRAVENOUS

## 2012-11-07 SURGICAL SUPPLY — 43 items
APPLIER CLIP LOGIC TI 5 (MISCELLANEOUS) IMPLANT
APPLIER CLIP ROT 10 11.4 M/L (STAPLE)
BANDAGE ADHESIVE 1X3 (GAUZE/BANDAGES/DRESSINGS) ×6 IMPLANT
BENZOIN TINCTURE PRP APPL 2/3 (GAUZE/BANDAGES/DRESSINGS) ×2 IMPLANT
CANISTER SUCTION 2500CC (MISCELLANEOUS) ×2 IMPLANT
CHLORAPREP W/TINT 26ML (MISCELLANEOUS) ×2 IMPLANT
CLIP APPLIE ROT 10 11.4 M/L (STAPLE) IMPLANT
CLOTH BEACON ORANGE TIMEOUT ST (SAFETY) ×2 IMPLANT
COVER SURGICAL LIGHT HANDLE (MISCELLANEOUS) ×2 IMPLANT
CUTTER LINEAR ENDO 35 ETS (STAPLE) ×2 IMPLANT
CUTTER LINEAR ENDO 35 ETS TH (STAPLE) IMPLANT
DECANTER SPIKE VIAL GLASS SM (MISCELLANEOUS) ×2 IMPLANT
ELECT REM PT RETURN 9FT ADLT (ELECTROSURGICAL) ×2
ELECTRODE REM PT RTRN 9FT ADLT (ELECTROSURGICAL) ×1 IMPLANT
ENDOLOOP SUT PDS II  0 18 (SUTURE)
ENDOLOOP SUT PDS II 0 18 (SUTURE) IMPLANT
GLOVE BIO SURGEON STRL SZ7.5 (GLOVE) ×2 IMPLANT
GLOVE BIOGEL PI IND STRL 7.0 (GLOVE) ×2 IMPLANT
GLOVE BIOGEL PI INDICATOR 7.0 (GLOVE) ×2
GLOVE SURG SIGNA 7.5 PF LTX (GLOVE) ×2 IMPLANT
GLOVE SURG SS PI 7.0 STRL IVOR (GLOVE) ×2 IMPLANT
GOWN BRE IMP SLV AUR LG STRL (GOWN DISPOSABLE) ×4 IMPLANT
GOWN PREVENTION PLUS XLARGE (GOWN DISPOSABLE) ×2 IMPLANT
GOWN STRL NON-REIN LRG LVL3 (GOWN DISPOSABLE) ×2 IMPLANT
KIT BASIN OR (CUSTOM PROCEDURE TRAY) ×2 IMPLANT
KIT ROOM TURNOVER OR (KITS) ×2 IMPLANT
NS IRRIG 1000ML POUR BTL (IV SOLUTION) ×2 IMPLANT
PAD ARMBOARD 7.5X6 YLW CONV (MISCELLANEOUS) ×4 IMPLANT
POUCH SPECIMEN RETRIEVAL 10MM (ENDOMECHANICALS) ×2 IMPLANT
RELOAD /EVU35 (ENDOMECHANICALS) IMPLANT
RELOAD CUTTER ETS 35MM STAND (ENDOMECHANICALS) IMPLANT
SCALPEL HARMONIC ACE (MISCELLANEOUS) ×2 IMPLANT
SET IRRIG TUBING LAPAROSCOPIC (IRRIGATION / IRRIGATOR) ×2 IMPLANT
SLEEVE ENDOPATH XCEL 5M (ENDOMECHANICALS) ×2 IMPLANT
SPECIMEN JAR SMALL (MISCELLANEOUS) ×2 IMPLANT
STRIP CLOSURE SKIN 1/2X4 (GAUZE/BANDAGES/DRESSINGS) ×2 IMPLANT
SUT MON AB 4-0 PC3 18 (SUTURE) ×2 IMPLANT
TOWEL OR 17X24 6PK STRL BLUE (TOWEL DISPOSABLE) ×2 IMPLANT
TOWEL OR 17X26 10 PK STRL BLUE (TOWEL DISPOSABLE) ×2 IMPLANT
TRAY LAPAROSCOPIC (CUSTOM PROCEDURE TRAY) ×2 IMPLANT
TROCAR XCEL BLUNT TIP 100MML (ENDOMECHANICALS) ×2 IMPLANT
TROCAR XCEL NON-BLD 5MMX100MML (ENDOMECHANICALS) ×2 IMPLANT
WATER STERILE IRR 1000ML POUR (IV SOLUTION) IMPLANT

## 2012-11-07 NOTE — Progress Notes (Signed)
[name], male   DOB: 1951-05-30, 62 y.o.   MRN: [id number]  Doing very well post op Abdomen soft  Plan:  Discharge home

## 2012-11-07 NOTE — Transfer of Care (Signed)
Immediate Anesthesia Transfer of Care Note  Patient: Anthony Daniel  Procedure(s) Performed: Procedure(s): APPENDECTOMY LAPAROSCOPIC (N/A)  Patient Location: PACU  Anesthesia Type:General  Level of Consciousness: awake, alert  and oriented  Airway & Oxygen Therapy: Patient Spontanous Breathing and Patient connected to nasal cannula oxygen  Post-op Assessment: Report given to PACU RN and Post -op Vital signs reviewed and stable  Post vital signs: Reviewed and stable  Complications: No apparent anesthesia complications

## 2012-11-07 NOTE — Op Note (Signed)
APPENDECTOMY LAPAROSCOPIC  Procedure Note  WALID HAIG 11/07/2012   Pre-op Diagnosis: appendix mucocele     Post-op Diagnosis: same  Procedure(s): APPENDECTOMY LAPAROSCOPIC  Surgeon(s): Shelly Rubenstein, MD  Anesthesia: General  Staff:  Circulator: Sudie Bailey, RN Scrub Person: Leighton Parody, CST; Janeece Agee Pingue, CST  Estimated Blood Loss: Minimal               Specimens: sent to path          [facility] A   Date: 11/07/2012  Time: 9:16 AM

## 2012-11-07 NOTE — Interval H&P Note (Signed)
History and Physical Interval Note: no change  11/07/2012 7:56 AM  Anthony Daniel  has presented today for surgery, with the diagnosis of appendix mucocele  The various methods of treatment have been discussed with the patient and family. After consideration of risks, benefits and other options for treatment, the patient has consented to  Procedure(s): APPENDECTOMY LAPAROSCOPIC (N/A) as a surgical intervention .  The patient's history has been reviewed, patient examined, no change in status, stable for surgery.  I have reviewed the patient's chart and labs.  Questions were answered to the patient's satisfaction.     Shadi Sessler A

## 2012-11-07 NOTE — Progress Notes (Signed)
Discharge patient, home discharge instruction given, no question verbalized.

## 2012-11-07 NOTE — Anesthesia Postprocedure Evaluation (Signed)
  Anesthesia Post-op Note  Patient: Anthony Daniel  Procedure(s) Performed: Procedure(s): APPENDECTOMY LAPAROSCOPIC (N/A)  Patient Location: PACU  Anesthesia Type:General  Level of Consciousness: awake  Airway and Oxygen Therapy: Patient Spontanous Breathing  Post-op Pain: mild  Post-op Assessment: Post-op Vital signs reviewed  Post-op Vital Signs: stable  Complications: No apparent anesthesia complications

## 2012-11-07 NOTE — Preoperative (Signed)
Beta Blockers   Reason not to administer Beta Blockers:Not Applicable 

## 2012-11-07 NOTE — H&P (Signed)
[name], male DOB: 08-19-1950, 62 y.o. MRN: [id number]  Chief Complaint   Patient presents with   .  Other     dilated appendix, possible mucocel   HPI  Anthony Daniel is a 62 y.o. male.  HPI  This is a very pleasant gentleman with a history of kidney stones who was found on a recent CAT scan to have a possible mucocele of the appendix. The right-sided abdominal pain he was having during the CAT scan has resolved. He is currently asymptomatic. His last colonoscopy was partially 9 years ago and was negative. He currently has no complaints.  Past Medical History   Diagnosis  Date   .  Diabetes mellitus without complication    .  Hyperlipidemia    .  Cancer  1996     bladder cancer   .  Kidney stones     Past Surgical History   Procedure  Laterality  Date   .  Tonsillectomy   1975   .  Bladder surgery      Family History   Problem  Relation  Age of Onset   .  Cancer  Other      Colon and Prostate Cancer   .  Hypertension  Other    .  Diabetes  Other    .  Alcohol abuse  Neg Hx    .  Drug abuse  Neg Hx    .  Early death  Neg Hx    .  Heart disease  Neg Hx    .  Hyperlipidemia  Neg Hx    .  Kidney disease  Neg Hx    .  Stroke  Neg Hx    Social History  History   Substance Use Topics   .  Smoking status:  Not on file   .  Smokeless tobacco:  Never Used   .  Alcohol Use:  3.0 oz/week     5 Shots of liquor per week    Allergies   Allergen  Reactions   .  Crestor (Rosuvastatin)      Muscle aches   .  Lipitor (Atorvastatin)      Muscle aches   .  Contrast Media (Iodinated Diagnostic Agents)      PT TRIED THIS TWICE.EVEN WITH PRE-MEDS AND STILL HAD NAUSEA AND ITCHING--WAS TOLD HE CAN NOT TAKE iv ANYMORE    Current Outpatient Prescriptions   Medication  Sig  Dispense  Refill   .  aspirin 81 MG tablet  Take 81 mg by mouth daily.     .  cholecalciferol (VITAMIN D) 1000 UNITS tablet  Take 1,000 Units by mouth daily.     .  Insulin Glargine (LANTUS SOLOSTAR  [place])  Inject 34 Units into the skin 2 (two) times daily.     Marland Kitchen  oxyCODONE-acetaminophen (PERCOCET) 5-325 MG per tablet  Take 2 tablets by mouth every 4 (four) hours as needed for pain.  20 tablet  0   .  Potassium Citrate 15 MEQ (1620 MG) TBCR  Take 1 tablet by mouth 2 (two) times daily.     .  ramipril (ALTACE) 10 MG tablet  Take 10 mg by mouth daily before breakfast.     .  tadalafil (CIALIS) 20 MG tablet  Take 20 mg by mouth daily as needed for erectile dysfunction.     .  Tamsulosin HCl (FLOMAX) 0.4 MG CAPS  Take 0.4 mg by mouth daily.     Marland Kitchen  vitamin B-12 (CYANOCOBALAMIN) 500 MCG tablet  Take 500 mcg by mouth daily.      No current facility-administered medications for this visit.   Review of Systems  Review of Systems  Constitutional: Negative for fever, chills and unexpected weight change.  HENT: Negative for hearing loss, congestion, sore throat, trouble swallowing and voice change.  Eyes: Negative for visual disturbance.  Respiratory: Negative for cough and wheezing.  Cardiovascular: Negative for chest pain, palpitations and leg swelling.  Gastrointestinal: Negative for nausea, vomiting, abdominal pain, diarrhea, constipation, blood in stool, abdominal distention, anal bleeding and rectal pain.  Genitourinary: Negative for hematuria and difficulty urinating.  Musculoskeletal: Negative for arthralgias.  Skin: Negative for rash and wound.  Neurological: Negative for seizures, syncope, weakness and headaches.  Hematological: Negative for adenopathy. Does not bruise/bleed easily.  Psychiatric/Behavioral: Negative for confusion.  Blood pressure 140/84, temperature 97.2 F (36.2 C), height 6\' 1"  (1.854 m), weight 244 lb (110.678 kg).  Physical Exam  Physical Exam  Constitutional: He is oriented to person, place, and time. He appears well-developed and well-nourished. No distress.  HENT:  Head: Normocephalic and atraumatic.  Right Ear: External ear normal.  Left Ear: External ear  normal.  Nose: Nose normal.  Mouth/Throat: Oropharynx is clear and moist. No oropharyngeal exudate.  Eyes: Conjunctivae are normal. Pupils are equal, round, and reactive to light. Right eye exhibits no discharge. Left eye exhibits no discharge. No scleral icterus.  Neck: Normal range of motion. Neck supple. No tracheal deviation present. No thyromegaly present.  Cardiovascular: Normal rate, regular rhythm, normal heart sounds and intact distal pulses.  No murmur heard.  Pulmonary/Chest: Effort normal and breath sounds normal. No respiratory distress. He has no wheezes.  Abdominal: Soft. Bowel sounds are normal. He exhibits no distension. There is no tenderness. There is no rebound.  Musculoskeletal: Normal range of motion. He exhibits no edema.  Lymphadenopathy:  He has no cervical adenopathy.  Neurological: He is alert and oriented to person, place, and time.  Skin: Skin is warm and dry. No rash noted. He is not diaphoretic. No erythema.  Psychiatric: His behavior is normal. Judgment normal.   Data Reviewed  I reviewed his CAT scan of the abdomen and pelvis   Assessment  Mucocele of the appendix   Plan  Laparoscopic appendectomy is recommended. I explained him the reasonings behind proceeding with an appendectomy given the CT findings. I explained the need to rule out malignancy of the appendix. I discussed the surgical procedure in detail. I discussed the risk of the procedure which includes but is not limited to bleeding, infection, injury to surrounding structures, need to convert to an open procedure, findings of malignancy with me for further surgery, et Karie Soda. He understands and wished to proceed. Surgery will thus be scheduled. Likelihood of success is good

## 2012-11-07 NOTE — Discharge Summary (Signed)
Physician Discharge Summary  [name] MRN: [id number] DOB/AGE: 1950-08-21 62 y.o.  Admit date: 11/07/2012 Discharge date: 11/07/2012  Admission Diagnoses:  Discharge Diagnoses:  Active Problems:   * No active hospital problems. * mucocele of the appendix  Discharged Condition: good  Hospital Course: did well.  Sent home day of surgery  Consults: None  Significant Diagnostic Studies:   Treatments: surgery: laparoscopic appendectomy  Discharge Exam: Blood pressure 129/64, pulse 70, temperature 97.7 F (36.5 C), temperature source Oral, resp. rate 18, height 6\' 1"  (1.854 m), weight 249 lb 12.5 oz (113.3 kg), SpO2 99.00%. General appearance: alert and no distress Resp: clear to auscultation bilaterally Cardio: regular rate and rhythm, S1, S2 normal, no murmur, click, rub or gallop Incision/Wound:abdomen soft, dressings dry  Disposition: 01-Home or Self Care   Future Appointments Provider Department Dept Phone   11/20/2012 10:10 AM Shelly Rubenstein, MD [facility], [place] [phone]   03/04/2013 8:15 AM Etta Grandchild, MD [facility] [phone]       Medication List    TAKE these medications       aspirin EC 81 MG tablet  Take 81 mg by mouth daily.     cholecalciferol 1000 UNITS tablet  Commonly known as:  VITAMIN D  Take 1,000 Units by mouth daily.     insulin lispro 100 UNIT/ML injection  Commonly known as:  HUMALOG  Inject 2-20 Units into the skin 3 (three) times daily before meals. Per sliding scale     LANTUS SOLOSTAR [place]  Inject 40 Units into the skin 2 (two) times daily.     oxyCODONE-acetaminophen 5-325 MG per tablet  Commonly known as:  PERCOCET/ROXICET  Take 1-2 tablets by mouth every 4 (four) hours as needed.     Pitavastatin Calcium 2 MG Tabs  Commonly known as:  LIVALO  Take 1 tablet (2 mg total) by mouth daily.     Potassium Citrate 15 MEQ (1620 MG) Tbcr  Take 1 tablet by mouth 2 (two)  times daily.     ramipril 10 MG tablet  Commonly known as:  ALTACE  Take 10 mg by mouth daily before breakfast.     tadalafil 20 MG tablet  Commonly known as:  CIALIS  Take 20 mg by mouth daily as needed for erectile dysfunction.     tamsulosin 0.4 MG Caps  Commonly known as:  FLOMAX  Take 0.4 mg by mouth daily.     vitamin B-12 500 MCG tablet  Commonly known as:  CYANOCOBALAMIN  Take 500 mcg by mouth daily.           Follow-up Information   Follow up with [facility] A, MD In 2 weeks.   Contact information:   [address] [place] [address] [phone]       Signed: Shelly Rubenstein 11/07/2012, 2:28 PM

## 2012-11-07 NOTE — Anesthesia Preprocedure Evaluation (Addendum)
Anesthesia Evaluation  Patient identified by MRN, date of birth, ID band Patient awake    Reviewed: Allergy & Precautions, H&P , NPO status , Patient's Chart, lab work & pertinent test results, reviewed documented beta blocker date and time   History of Anesthesia Complications Negative for: history of anesthetic complications  Airway Mallampati: II TM Distance: >3 FB Neck ROM: Full    Dental  (+) Teeth Intact and Dental Advisory Given   Pulmonary Current Smoker,  breath sounds clear to auscultation        Cardiovascular hypertension, Pt. on medications Rhythm:Regular Rate:Normal     Neuro/Psych negative neurological ROS  negative psych ROS   GI/Hepatic negative GI ROS, Neg liver ROS,   Endo/Other  diabetes, Type 2, Insulin Dependent  Renal/GU negative Renal ROS     Musculoskeletal negative musculoskeletal ROS (+)   Abdominal   Peds  Hematology negative hematology ROS (+)   Anesthesia Other Findings   Reproductive/Obstetrics negative OB ROS                         Anesthesia Physical Anesthesia Plan  ASA: III  Anesthesia Plan: General   Post-op Pain Management:    Induction: Intravenous  Airway Management Planned: Oral ETT  Additional Equipment:   Intra-op Plan:   Post-operative Plan: Extubation in OR  Informed Consent:   Plan Discussed with: CRNA and Surgeon  Anesthesia Plan Comments:         Anesthesia Quick Evaluation

## 2012-11-08 ENCOUNTER — Encounter (HOSPITAL_COMMUNITY): Payer: Self-pay | Admitting: Surgery

## 2012-11-08 NOTE — Op Note (Signed)
NAMEPRANEETH, BUSSEY                ACCOUNT NO.:  [id number]  MEDICAL RECORD NO.:  [id number]  LOCATION:  6N15C                        FACILITY:  [facility]  PHYSICIAN:  Abigail Miyamoto, M.D. DATE OF BIRTH:  1951-02-09  DATE OF PROCEDURE:  11/07/2012 DATE OF DISCHARGE:  11/07/2012                              OPERATIVE REPORT   PREOPERATIVE DIAGNOSIS:  Mucocele of the appendix.  POSTOPERATIVE DIAGNOSIS:  Mucocele of the appendix.  PROCEDURE:  Laparoscopic appendectomy.  SURGEON:  Abigail Miyamoto, M.D.  ANESTHESIA:  General and 0.5% Marcaine.  ESTIMATED BLOOD LOSS:  Minimal.  INDICATIONS:  This is a 62 year old gentleman who was found to have an enlarged appendix consistent with a mucocele on CAT scan of the abdomen and pelvis for left lower quadrant abdominal pain.  This had actually increased in size over a several year period of time.  The decision was made to remove the appendix to rule out malignancy.  FINDINGS:  The patient was indeed found to have a very dilated appendix. There was no evidence of metastatic disease or findings consistent with a malignancy.  PROCEDURE IN DETAIL:  The patient was brought to the operating room, identified as Anthony Daniel.  He was placed supine on operative table and general anesthesia was induced.  His abdomen was then prepped and draped in usual sterile fashion.  I made a small incision just below the umbilicus with a scalpel.  I took this down to the fascia, which was opened with a scalpel.  A hemostat was used to pass to the peritoneal cavity under direct vision.  A 0-Vicryl pursestring suture was then placed around the fascial opening.  The Hasson port was placed through the opening and insufflation of the abdomen was begun.  A 5 mm port was then placed in the patient's right upper quadrant and another in the left lower quadrant, all under direct vision.  The cecum was identified. The appendix was found to be retrocecal.  I did take  down attachments from the cecum to the abdominal sidewall with Harmonic Scalpel.  I was unable to visualize the appendix, which was retrocecal, it was quite enlarged.  I was able to elevate it up, it was not fixated to any of the surrounding tissue.  I took down the mesoappendix with the Harmonic Scalpel and then identified the base of the appendix.  I then transected the base of the appendix with the endoscopic GIA stapler.  Once the appendix was removed, it was placed in an endosac and removed through the incision at the umbilicus.  I thoroughly examined the abdominal cavity and saw no evidence of malignancy.  I examined the appendiceal stump and mesoappendix and hemostasis appeared to be achieved.  I irrigated the abdomen with saline.  0-Vicryl at the umbilicus was tied in place closing the fascial defect.  All ports were then removed under direct vision and the abdomen was deflated.  All incisions were anesthetized with Marcaine and closed with 4-0 Monocryl subcuticular sutures.  Steri-Strips and Band-Aids were then applied. The patient tolerated the procedure well.  All the counts were correct at the end of the procedure.  The patient was then extubated in the operating  room and taken in a stable condition to the recovery room.     Abigail Miyamoto, M.D.     DB/MEDQ  D:  11/07/2012  T:  11/08/2012  Job:  [id number]

## 2012-11-20 ENCOUNTER — Ambulatory Visit (INDEPENDENT_AMBULATORY_CARE_PROVIDER_SITE_OTHER): Payer: BC Managed Care – PPO | Admitting: Surgery

## 2012-11-20 ENCOUNTER — Encounter (INDEPENDENT_AMBULATORY_CARE_PROVIDER_SITE_OTHER): Payer: Self-pay | Admitting: Surgery

## 2012-11-20 VITALS — BP 152/84 | HR 76 | Temp 97.1°F | Resp 16 | Ht 73.0 in | Wt 244.0 lb

## 2012-11-20 DIAGNOSIS — Z09 Encounter for follow-up examination after completed treatment for conditions other than malignant neoplasm: Secondary | ICD-10-CM

## 2012-11-20 NOTE — Progress Notes (Signed)
Subjective:     [name]., male   DOB: 06/27/1951, 62 y.o.   MRN: [id number]  HPI He is here for his first postop visit status post appendectomy for mucocele of the appendix. He has no complaints and is doing well  Review of Systems     Objective:   Physical Exam On exam, his incisions are well healed. 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 I will see him back as needed. No further care as necessary at this point

## 2013-03-04 ENCOUNTER — Encounter: Payer: Self-pay | Admitting: Internal Medicine

## 2013-03-04 ENCOUNTER — Other Ambulatory Visit (INDEPENDENT_AMBULATORY_CARE_PROVIDER_SITE_OTHER): Payer: BC Managed Care – PPO

## 2013-03-04 ENCOUNTER — Ambulatory Visit (INDEPENDENT_AMBULATORY_CARE_PROVIDER_SITE_OTHER): Payer: BC Managed Care – PPO | Admitting: Internal Medicine

## 2013-03-04 VITALS — BP 124/64 | HR 70 | Temp 97.6°F | Resp 16 | Wt 242.0 lb

## 2013-03-04 DIAGNOSIS — E785 Hyperlipidemia, unspecified: Secondary | ICD-10-CM

## 2013-03-04 DIAGNOSIS — E108 Type 1 diabetes mellitus with unspecified complications: Secondary | ICD-10-CM

## 2013-03-04 DIAGNOSIS — Z23 Encounter for immunization: Secondary | ICD-10-CM

## 2013-03-04 LAB — LIPID PANEL
Cholesterol: 120 mg/dL (ref 0–200)
HDL: 33.5 mg/dL — ABNORMAL LOW (ref >39.00)
LDL Cholesterol: 75 mg/dL (ref 0–99)
Total CHOL/HDL Ratio: 4
Triglycerides: 59 mg/dL (ref 0.0–149.0)
VLDL: 11.8 mg/dL (ref 0.0–40.0)

## 2013-03-04 LAB — COMPREHENSIVE METABOLIC PANEL
ALT: 17 U/L (ref 0–53)
AST: 18 U/L (ref 0–37)
Albumin: 3.7 g/dL (ref 3.5–5.2)
Alkaline Phosphatase: 95 U/L (ref 39–117)
BUN: 13 mg/dL (ref 6–23)
CO2: 28 mEq/L (ref 19–32)
Calcium: 8.7 mg/dL (ref 8.4–10.5)
Chloride: 106 mEq/L (ref 96–112)
Creatinine, Ser: 1 mg/dL (ref 0.4–1.5)
GFR: 97.86 mL/min (ref >60.00)
Glucose, Bld: 129 mg/dL — ABNORMAL HIGH (ref 70–99)
Potassium: 4.4 mEq/L (ref 3.5–5.1)
Sodium: 138 mEq/L (ref 135–145)
Total Bilirubin: 0.6 mg/dL (ref 0.3–1.2)
Total Protein: 7 g/dL (ref 6.0–8.3)

## 2013-03-04 LAB — HEMOGLOBIN A1C: Hgb A1c MFr Bld: 8.5 % — ABNORMAL HIGH (ref 4.6–6.5)

## 2013-03-04 MED ORDER — DAPAGLIFLOZIN PROPANEDIOL 10 MG PO TABS: 1.0000 | ORAL_TABLET | Freq: Every day | ORAL | Status: DC

## 2013-03-04 NOTE — Progress Notes (Signed)
Subjective:    [name]., male    DOB: 02-12-1951, 62 y.o.   MRN: [id number]  Diabetes He presents for his follow-up diabetic visit. He has type 1 diabetes mellitus. His disease course has been fluctuating. There are no hypoglycemic associated symptoms. Pertinent negatives for hypoglycemia include no pallor. Associated symptoms include polydipsia, polyphagia and polyuria. Pertinent negatives for diabetes include no blurred vision, no chest pain, no fatigue, no foot paresthesias, no foot ulcerations, no visual change, no weakness and no weight loss. There are no hypoglycemic complications. There are no diabetic complications. Current diabetic treatment includes insulin injections and intensive insulin program. He is compliant with treatment some of the time. His weight is stable. He is following a generally healthy diet. Meal planning includes avoidance of concentrated sweets. He has not had a previous visit with a dietician. He never participates in exercise. His home blood glucose trend is increasing steadily. An ACE inhibitor/angiotensin II receptor blocker is being taken. He does not see a podiatrist.Eye exam is current.      Review of Systems  Constitutional: Negative.  Negative for fever, chills, weight loss, diaphoresis, activity change, appetite change, fatigue and unexpected weight change.  HENT: Negative.   Eyes: Negative.  Negative for blurred vision.  Respiratory: Negative.  Negative for cough, choking, chest tightness, shortness of breath, wheezing and stridor.   Cardiovascular: Negative.  Negative for chest pain, palpitations and leg swelling.  Gastrointestinal: Negative.  Negative for nausea, vomiting, abdominal pain, diarrhea and constipation.  Endocrine: Positive for polydipsia, polyphagia and polyuria.  Musculoskeletal: Negative.  Negative for myalgias and arthralgias.  Skin: Negative.  Negative for color change, pallor, rash and wound.  Allergic/Immunologic:  Negative.   Neurological: Negative.  Negative for weakness.  Hematological: Negative.  Negative for adenopathy. Does not bruise/bleed easily.  Psychiatric/Behavioral: Negative.        Objective:   Physical Exam  Vitals reviewed. Constitutional: He is oriented to person, place, and time. He appears well-developed and well-nourished. No distress.  HENT:  Head: Normocephalic and atraumatic.  Mouth/Throat: Oropharynx is clear and moist. No oropharyngeal exudate.  Eyes: Conjunctivae are normal. Right eye exhibits no discharge. Left eye exhibits no discharge. No scleral icterus.  Neck: Normal range of motion. Neck supple. No JVD present. No tracheal deviation present. No thyromegaly present.  Cardiovascular: Normal rate, regular rhythm, normal heart sounds and intact distal pulses.  Exam reveals no gallop and no friction rub.   No murmur heard. Pulmonary/Chest: Effort normal and breath sounds normal. No stridor. No respiratory distress. He has no wheezes. He has no rales. He exhibits no tenderness.  Abdominal: Soft. Bowel sounds are normal. He exhibits no distension and no mass. There is no tenderness. There is no rebound and no guarding.  Musculoskeletal: Normal range of motion. He exhibits no edema and no tenderness.  Lymphadenopathy:    He has no cervical adenopathy.  Neurological: He is oriented to person, place, and time.  Skin: Skin is warm and dry. No rash noted. He is not diaphoretic. No erythema. No pallor.  Psychiatric: He has a normal mood and affect. His behavior is normal. Thought content normal.     Lab Results  Component Value Date   WBC 10.2 10/31/2012   HGB 14.8 10/31/2012   HCT 41.7 10/31/2012   PLT 166 10/31/2012   GLUCOSE 169* 10/31/2012   CHOL 169 06/26/2012   TRIG 84.0 06/26/2012   HDL 40.20 06/26/2012   LDLCALC 112* 06/26/2012  ALT 15 08/25/2012   AST 13 08/25/2012   NA 139 10/31/2012   K 5.0 10/31/2012   CL 105 10/31/2012   CREATININE 0.86 10/31/2012   BUN 12  10/31/2012   CO2 27 10/31/2012   TSH 0.64 06/26/2012   PSA 1.24 06/26/2012   HGBA1C 8.1* 10/23/2012       Assessment & Plan:

## 2013-03-04 NOTE — Assessment & Plan Note (Signed)
He is doing well on livalo 

## 2013-03-04 NOTE — Assessment & Plan Note (Signed)
His A1C is too high so I have asked him to add farxiga to his insulin regimen

## 2013-03-04 NOTE — Patient Instructions (Signed)
Type 1 Diabetes Mellitus, Adult Type 1 diabetes mellitus, often simply referred to as diabetes, is a long-term (chronic) disease. It occurs when the islet cells in the pancreas that make insulin (a hormone) are destroyed and can no longer make insulin. Insulin is needed to move sugars from food into the tissue cells. The tissue cells use the sugars for energy. In people with type 1 diabetes, the sugars build up in the blood instead of going into the tissue cells. As a result, high blood sugar (hyperglycemia) develops. Without insulin, the body breaks down fat cells for the needed energy. This breakdown of fat cells produces acid chemicals (ketones), which increases the acid levels in the body. The effect of either high ketone or sugar (glucose) levels can be life-threatening.  Type 1 diabetes was also previously called juvenile diabetes. It most often occurs before the age of 30, but it can occur at any age. RISK FACTORS A person is predisposed to developing type 1 diabetes if someone in his or her family has the disease and is exposed to certain additional environmental triggers.  SYMPTOMS  Symptoms of type 1 diabetes may develop gradually over days to weeks or suddenly. The symptoms occur due to hyperglycemia. The symptoms can include:   Increased thirst (polydipsia).  Increased urination (polyuria).  Increased urination during the night (nocturia).  Weight loss. This weight loss may be rapid.  Frequent, recurring infections.  Tiredness (fatigue).  Weakness.  Vision changes, such as blurred vision.  Fruity smell to your breath.  Abdominal pain.  Nausea or vomiting. DIAGNOSIS  Type 1 diabetes is diagnosed when symptoms of diabetes are present and when blood glucose levels are increased. Your blood glucose level may be checked by one or more of the following blood tests:  A fasting blood glucose test. You will not be allowed to eat for at least 8 hours before a blood sample is  taken.  A random blood glucose test. Your blood glucose is checked at any time of the day regardless of when you ate.  A hemoglobin A1c blood glucose test. A hemoglobin A1c test provides information about blood glucose control over the previous 3 months. TREATMENT  Although type 1 diabetes cannot be prevented, it can be managed with insulin, diet, and exercise.  You will need to take insulin daily to keep blood glucose in the desired range.  You will need to match insulin dosing with exercise and healthy food choices. The treatment goal is to maintain the before-meal blood sugar (preprandial glucose) level at 70 130 mg/dL.  HOME CARE INSTRUCTIONS   Have your hemoglobin A1c level checked twice a year.  Perform daily blood glucose monitoring as directed by your caregiver.  Monitor urine ketones when you are ill and as directed by your caregiver.  Take your insulin as directed by your caregiver to maintain your blood glucose level in the desired range.  Never run out of insulin. It is needed every day.  Adjust insulin based on your intake of carbohydrates. Carbohydrates can raise blood glucose levels but need to be included in your diet. Carbohydrates provide vitamins, minerals, and fiber, which are an essential part of a healthy diet. Carbohydrates are found in fruits, vegetables, whole grains, dairy products, legumes, and foods containing added sugars.    Eat healthy foods. Alternate 3 meals with 3 snacks.  Maintain a healthy weight.  Carry a medical alert card or wear your medical alert jewelry.  Carry a 15 gram carbohydrate snack with you   at all times to treat low blood glucose (hypoglycemia). Some examples of 15 gram carbohydrate snacks include:  Glucose tablets, 3 or 4.   Glucose gel, 15 gram tube.  Raisins, 2 tablespoons (24 grams).  Jelly beans, 6.  Animal crackers, 8.  Fruit juice, regular soda, or low-fat milk, 4 ounces (120 mL).  Gummy treats,  9.    Recognize hypoglycemia. Hypoglycemia occurs with blood glucose levels of 70 mg/dL and below. The risk for hypoglycemia increases when fasting or skipping meals, during or after intense exercise, and during sleep. Hypoglycemia symptoms can include:  Tremors or shakes.  Decreased ability to concentrate.  Sweating.  Increased heart rate.  Headache.  Dry mouth.  Hunger.  Irritability.  Anxiety.  Restless sleep.  Altered speech or coordination.  Confusion.  Treat hypoglycemia promptly. If you are alert and able to safely swallow, follow the 15:15 rule:  Take 15 20 grams of rapid-acting glucose or carbohydrate. Rapid-acting options include glucose gel, glucose tablets, or 4 ounces (120 mL) of fruit juice, regular soda, or low-fat milk.  Check your blood glucose level 15 minutes after taking the glucose.   Take 15 20 grams more of glucose if the repeat blood glucose level is still 70 mg/dL or below.  Eat a meal or snack within 1 hour once blood glucose levels return to normal.  Be alert to polyuria and polydipsia, which are early signs of hyperglycemia. An early awareness of hyperglycemia allows for prompt treatment. Treat hyperglycemia as directed by your caregiver.  Engage in at least 150 minutes of moderate-intensity physical activity a week, spread over at least 3 days of the week or as directed by your caregiver.  Adjust your insulin dosing and food intake as needed if you start a new exercise or sport.  Follow your sick day plan at any time you are unable to eat or drink as usual.   Avoid tobacco use.  Limit alcohol intake to no more than 1 drink per day for nonpregnant women and 2 drinks per day for men. You should drink alcohol only when you are also eating food. Talk with your caregiver about whether alcohol is safe for you. Tell your caregiver if you drink alcohol several times a week.  Follow up with your caregiver regularly.  Schedule an eye exam  within 5 years of diagnosis and then annually.  Perform daily skin and foot care. Examine your skin and feet daily for cuts, bruises, redness, nail problems, bleeding, blisters, or sores. A foot exam by a caregiver should be done annually.  Brush your teeth and gums at least twice a day and floss at least once a day. Follow up with your dentist regularly.  Share your diabetes management plan with your workplace or school.  Stay up-to-date with immunizations.  Learn to manage stress.  Obtain ongoing diabetes education and support as needed.  Participate or seek rehabilitation as needed to maintain or improve independence and quality of life. Request a physical or occupational therapy referral if you are having foot or hand numbness or difficulties with grooming, dressing, eating, or physical activity. SEEK MEDICAL CARE IF:   You are unable to eat food or drink fluids for more than 6 hours.  You have nausea and vomiting for more than 6 hours.  Your blood glucose level is over 240 mg/dL.  There is a change in mental status.  You develop an additional serious illness.  You have diarrhea for more than 6 hours.  You have been   sick or have had a fever for a couple of days and are not getting better.  You have pain during any physical activity. SEEK IMMEDIATE MEDICAL CARE IF:  You have difficulty breathing.  You have moderate to large ketone levels. MAKE SURE YOU:  Understand these instructions.  Will watch your condition.  Will get help right away if you are not doing well or get worse. Document Released: 07/01/2000 Document Revised: 03/28/2012 Document Reviewed: 01/31/2012 [employer] Patient Information 2014 [employer].  

## 2013-05-23 ENCOUNTER — Other Ambulatory Visit: Payer: Self-pay

## 2013-07-01 ENCOUNTER — Other Ambulatory Visit (INDEPENDENT_AMBULATORY_CARE_PROVIDER_SITE_OTHER): Payer: BC Managed Care – PPO

## 2013-07-01 ENCOUNTER — Ambulatory Visit (INDEPENDENT_AMBULATORY_CARE_PROVIDER_SITE_OTHER): Payer: BC Managed Care – PPO | Admitting: Internal Medicine

## 2013-07-01 ENCOUNTER — Encounter: Payer: Self-pay | Admitting: Internal Medicine

## 2013-07-01 VITALS — BP 130/72 | HR 76 | Temp 97.7°F | Resp 16 | Ht 73.0 in | Wt 251.0 lb

## 2013-07-01 DIAGNOSIS — E108 Type 1 diabetes mellitus with unspecified complications: Secondary | ICD-10-CM

## 2013-07-01 DIAGNOSIS — E785 Hyperlipidemia, unspecified: Secondary | ICD-10-CM

## 2013-07-01 DIAGNOSIS — E11319 Type 2 diabetes mellitus with unspecified diabetic retinopathy without macular edema: Secondary | ICD-10-CM

## 2013-07-01 LAB — BASIC METABOLIC PANEL
BUN: 12 mg/dL (ref 6–23)
CO2: 27 mEq/L (ref 19–32)
Calcium: 8.8 mg/dL (ref 8.4–10.5)
Chloride: 106 mEq/L (ref 96–112)
Creatinine, Ser: 1 mg/dL (ref 0.4–1.5)
GFR: 97.75 mL/min (ref >60.00)
Glucose, Bld: 90 mg/dL (ref 70–99)
Potassium: 4.2 mEq/L (ref 3.5–5.1)
Sodium: 139 mEq/L (ref 135–145)

## 2013-07-01 LAB — HEMOGLOBIN A1C: Hgb A1c MFr Bld: 8.3 % — ABNORMAL HIGH (ref 4.6–6.5)

## 2013-07-01 NOTE — Assessment & Plan Note (Signed)
He has achieved his A1C goal

## 2013-07-01 NOTE — Progress Notes (Signed)
Subjective:    [name]., male    DOB: [birth year], 62 y.o.   MRN: [id number]  Diabetes He presents for his follow-up diabetic visit. He has type 2 diabetes mellitus. His disease course has been stable. There are no hypoglycemic associated symptoms. Pertinent negatives for hypoglycemia include no dizziness, headaches or tremors. Pertinent negatives for diabetes include no blurred vision, no chest pain, no fatigue, no foot paresthesias, no foot ulcerations, no polydipsia, no polyphagia, no polyuria, no visual change, no weakness and no weight loss. There are no hypoglycemic complications. Symptoms are improving. There are no diabetic complications. Current diabetic treatment includes insulin injections and oral agent (monotherapy). He is compliant with treatment some of the time. He is following a generally healthy diet. Meal planning includes avoidance of concentrated sweets. He has not had a previous visit with a dietician. He participates in exercise intermittently. There is no change in his home blood glucose trend. An ACE inhibitor/angiotensin II receptor blocker is being taken. He does not see a podiatrist.Eye exam is not current.      Review of Systems  Constitutional: Negative.  Negative for fever, chills, weight loss, diaphoresis, appetite change and fatigue.  HENT: Negative.   Eyes: Negative.  Negative for blurred vision.  Respiratory: Negative.  Negative for apnea, cough, choking, chest tightness, shortness of breath, wheezing and stridor.   Cardiovascular: Negative.  Negative for chest pain, palpitations and leg swelling.  Gastrointestinal: Negative.  Negative for nausea, vomiting, abdominal pain, diarrhea, constipation and blood in stool.  Endocrine: Negative.  Negative for polydipsia, polyphagia and polyuria.  Genitourinary: Negative.   Musculoskeletal: Negative.   Skin: Negative.   Allergic/Immunologic: Negative.   Neurological: Negative for dizziness,  tremors, weakness, light-headedness, numbness and headaches.  Hematological: Negative.  Negative for adenopathy. Does not bruise/bleed easily.  Psychiatric/Behavioral: Negative.        Objective:   Physical Exam  Vitals reviewed. Constitutional: He is oriented to person, place, and time. He appears well-developed and well-nourished. No distress.  HENT:  Head: Normocephalic and atraumatic.  Mouth/Throat: Oropharynx is clear and moist. No oropharyngeal exudate.  Eyes: Conjunctivae are normal. Right eye exhibits no discharge. Left eye exhibits no discharge. No scleral icterus.  Neck: Normal range of motion. Neck supple. No JVD present. No tracheal deviation present. No thyromegaly present.  Cardiovascular: Normal rate, regular rhythm, normal heart sounds and intact distal pulses.  Exam reveals no gallop and no friction rub.   No murmur heard. Pulmonary/Chest: Effort normal and breath sounds normal. No stridor. No respiratory distress. He has no wheezes. He has no rales. He exhibits no tenderness.  Abdominal: Soft. Bowel sounds are normal. He exhibits no distension and no mass. There is no tenderness. There is no rebound and no guarding.  Musculoskeletal: Normal range of motion. He exhibits no edema and no tenderness.  Lymphadenopathy:    He has no cervical adenopathy.  Neurological: He is oriented to person, place, and time.  Skin: Skin is warm and dry. No rash noted. He is not diaphoretic. No erythema. No pallor.  Psychiatric: He has a normal mood and affect. His behavior is normal. Judgment and thought content normal.     Lab Results  Component Value Date   WBC 10.2 10/31/2012   HGB 14.8 10/31/2012   HCT 41.7 10/31/2012   PLT 166 10/31/2012   GLUCOSE 129* 03/04/2013   CHOL 120 03/04/2013   TRIG 59.0 03/04/2013   HDL 33.50* 03/04/2013   LDLCALC 75  03/04/2013   ALT 17 03/04/2013   AST 18 03/04/2013   NA 138 03/04/2013   K 4.4 03/04/2013   CL 106 03/04/2013   CREATININE 1.0 03/04/2013   BUN  13 03/04/2013   CO2 28 03/04/2013   TSH 0.64 06/26/2012   PSA 1.24 06/26/2012   HGBA1C 8.5* 03/04/2013       Assessment & Plan:

## 2013-07-01 NOTE — Patient Instructions (Signed)
Type 2 Diabetes Mellitus, Adult Type 2 diabetes mellitus, often simply referred to as type 2 diabetes, is a long-lasting (chronic) disease. In type 2 diabetes, the pancreas does not make enough insulin (a hormone), the cells are less responsive to the insulin that is made (insulin resistance), or both. Normally, insulin moves sugars from food into the tissue cells. The tissue cells use the sugars for energy. The lack of insulin or the lack of normal response to insulin causes excess sugars to build up in the blood instead of going into the tissue cells. As a result, high blood sugar (hyperglycemia) develops. The effect of high sugar (glucose) levels can cause many complications. Type 2 diabetes was also previously called adult-onset diabetes but it can occur at any age.  RISK FACTORS  A person is predisposed to developing type 2 diabetes if someone in the family has the disease and also has one or more of the following primary risk factors:  Overweight.  An inactive lifestyle.  A history of consistently eating high-calorie foods. Maintaining a normal weight and regular physical activity can reduce the chance of developing type 2 diabetes. SYMPTOMS  A person with type 2 diabetes may not show symptoms initially. The symptoms of type 2 diabetes appear slowly. The symptoms include:  Increased thirst (polydipsia).  Increased urination (polyuria).  Increased urination during the night (nocturia).  Weight loss. This weight loss may be rapid.  Frequent, recurring infections.  Tiredness (fatigue).  Weakness.  Vision changes, such as blurred vision.  Fruity smell to your breath.  Abdominal pain.  Nausea or vomiting.  Cuts or bruises which are slow to heal.  Tingling or numbness in the hands or feet. DIAGNOSIS Type 2 diabetes is frequently not diagnosed until complications of diabetes are present. Type 2 diabetes is diagnosed when symptoms or complications are present and when blood  glucose levels are increased. Your blood glucose level may be checked by one or more of the following blood tests:  A fasting blood glucose test. You will not be allowed to eat for at least 8 hours before a blood sample is taken.  A random blood glucose test. Your blood glucose is checked at any time of the day regardless of when you ate.  A hemoglobin A1c blood glucose test. A hemoglobin A1c test provides information about blood glucose control over the previous 3 months.  An oral glucose tolerance test (OGTT). Your blood glucose is measured after you have not eaten (fasted) for 2 hours and then after you drink a glucose-containing beverage. TREATMENT   You may need to take insulin or diabetes medicine daily to keep blood glucose levels in the desired range.  You will need to match insulin dosing with exercise and healthy food choices. The treatment goal is to maintain the before meal blood sugar (preprandial glucose) level at 70 130 mg/dL. HOME CARE INSTRUCTIONS   Have your hemoglobin A1c level checked twice a year.  Perform daily blood glucose monitoring as directed by your caregiver.  Monitor urine ketones when you are ill and as directed by your caregiver.  Take your diabetes medicine or insulin as directed by your caregiver to maintain your blood glucose levels in the desired range.  Never run out of diabetes medicine or insulin. It is needed every day.  Adjust insulin based on your intake of carbohydrates. Carbohydrates can raise blood glucose levels but need to be included in your diet. Carbohydrates provide vitamins, minerals, and fiber which are an essential part of   a healthy diet. Carbohydrates are found in fruits, vegetables, whole grains, dairy products, legumes, and foods containing added sugars.    Eat healthy foods. Alternate 3 meals with 3 snacks.  Lose weight if overweight.  Carry a medical alert card or wear your medical alert jewelry.  Carry a 15 gram  carbohydrate snack with you at all times to treat low blood glucose (hypoglycemia). Some examples of 15 gram carbohydrate snacks include:  Glucose tablets, 3 or 4   Glucose gel, 15 gram tube  Raisins, 2 tablespoons (24 grams)  Jelly beans, 6  Animal crackers, 8  Regular pop, 4 ounces (120 mL)  Gummy treats, 9  Recognize hypoglycemia. Hypoglycemia occurs with blood glucose levels of 70 mg/dL and below. The risk for hypoglycemia increases when fasting or skipping meals, during or after intense exercise, and during sleep. Hypoglycemia symptoms can include:  Tremors or shakes.  Decreased ability to concentrate.  Sweating.  Increased heart rate.  Headache.  Dry mouth.  Hunger.  Irritability.  Anxiety.  Restless sleep.  Altered speech or coordination.  Confusion.  Treat hypoglycemia promptly. If you are alert and able to safely swallow, follow the 15:15 rule:  Take 15 20 grams of rapid-acting glucose or carbohydrate. Rapid-acting options include glucose gel, glucose tablets, or 4 ounces (120 mL) of fruit juice, regular soda, or low fat milk.  Check your blood glucose level 15 minutes after taking the glucose.  Take 15 20 grams more of glucose if the repeat blood glucose level is still 70 mg/dL or below.  Eat a meal or snack within 1 hour once blood glucose levels return to normal.    Be alert to polyuria and polydipsia which are early signs of hyperglycemia. An early awareness of hyperglycemia allows for prompt treatment. Treat hyperglycemia as directed by your caregiver.  Engage in at least 150 minutes of moderate-intensity physical activity a week, spread over at least 3 days of the week or as directed by your caregiver. In addition, you should engage in resistance exercise at least 2 times a week or as directed by your caregiver.  Adjust your medicine and food intake as needed if you start a new exercise or sport.  Follow your sick day plan at any time you  are unable to eat or drink as usual.  Avoid tobacco use.  Limit alcohol intake to no more than 1 drink per day for nonpregnant women and 2 drinks per day for men. You should drink alcohol only when you are also eating food. Talk with your caregiver whether alcohol is safe for you. Tell your caregiver if you drink alcohol several times a week.  Follow up with your caregiver regularly.  Schedule an eye exam soon after the diagnosis of type 2 diabetes and then annually.  Perform daily skin and foot care. Examine your skin and feet daily for cuts, bruises, redness, nail problems, bleeding, blisters, or sores. A foot exam by a caregiver should be done annually.  Brush your teeth and gums at least twice a day and floss at least once a day. Follow up with your dentist regularly.  Share your diabetes management plan with your workplace or school.  Stay up-to-date with immunizations.  Learn to manage stress.  Obtain ongoing diabetes education and support as needed.  Participate in, or seek rehabilitation as needed to maintain or improve independence and quality of life. Request a physical or occupational therapy referral if you are having foot or hand numbness or difficulties with grooming,   dressing, eating, or physical activity. SEEK MEDICAL CARE IF:   You are unable to eat food or drink fluids for more than 6 hours.  You have nausea and vomiting for more than 6 hours.  Your blood glucose level is over 240 mg/dL.  There is a change in mental status.  You develop an additional serious illness.  You have diarrhea for more than 6 hours.  You have been sick or have had a fever for a couple of days and are not getting better.  You have pain during any physical activity.  SEEK IMMEDIATE MEDICAL CARE IF:  You have difficulty breathing.  You have moderate to large ketone levels. MAKE SURE YOU:  Understand these instructions.  Will watch your condition.  Will get help right away if  you are not doing well or get worse. Document Released: 07/04/2005 Document Revised: 03/28/2012 Document Reviewed: 01/31/2012 [employer] Patient Information 2014 [employer].  

## 2013-07-01 NOTE — Progress Notes (Signed)
Pre visit review using our clinic review tool, if applicable. No additional management support is needed unless otherwise documented below in the visit note. 

## 2013-07-01 NOTE — Assessment & Plan Note (Signed)
His A1C has improved but it is still a little high He agrees to be more compliant with farxiga and to improve his lifestyle modifications

## 2013-07-26 ENCOUNTER — Encounter (INDEPENDENT_AMBULATORY_CARE_PROVIDER_SITE_OTHER): Payer: BC Managed Care – PPO | Admitting: Ophthalmology

## 2013-07-26 DIAGNOSIS — E1165 Type 2 diabetes mellitus with hyperglycemia: Secondary | ICD-10-CM

## 2013-07-26 DIAGNOSIS — H3581 Retinal edema: Secondary | ICD-10-CM

## 2013-07-26 DIAGNOSIS — E1139 Type 2 diabetes mellitus with other diabetic ophthalmic complication: Secondary | ICD-10-CM

## 2013-07-26 DIAGNOSIS — H43819 Vitreous degeneration, unspecified eye: Secondary | ICD-10-CM

## 2013-07-26 DIAGNOSIS — E11319 Type 2 diabetes mellitus with unspecified diabetic retinopathy without macular edema: Secondary | ICD-10-CM

## 2013-07-26 DIAGNOSIS — H35039 Hypertensive retinopathy, unspecified eye: Secondary | ICD-10-CM

## 2013-07-26 DIAGNOSIS — I10 Essential (primary) hypertension: Secondary | ICD-10-CM

## 2013-07-26 LAB — HM DIABETES EYE EXAM

## 2013-07-30 ENCOUNTER — Encounter (INDEPENDENT_AMBULATORY_CARE_PROVIDER_SITE_OTHER): Payer: BC Managed Care – PPO | Admitting: Ophthalmology

## 2013-08-05 DIAGNOSIS — E113299 Type 2 diabetes mellitus with mild nonproliferative diabetic retinopathy without macular edema, unspecified eye: Secondary | ICD-10-CM | POA: Insufficient documentation

## 2013-09-23 ENCOUNTER — Encounter: Payer: Self-pay | Admitting: *Deleted

## 2013-09-23 ENCOUNTER — Encounter: Payer: BC Managed Care – PPO | Attending: Internal Medicine | Admitting: *Deleted

## 2013-09-23 DIAGNOSIS — E108 Type 1 diabetes mellitus with unspecified complications: Secondary | ICD-10-CM

## 2013-09-23 DIAGNOSIS — Z713 Dietary counseling and surveillance: Secondary | ICD-10-CM | POA: Insufficient documentation

## 2013-09-23 DIAGNOSIS — E109 Type 1 diabetes mellitus without complications: Secondary | ICD-10-CM | POA: Insufficient documentation

## 2013-09-23 NOTE — Patient Instructions (Signed)
Plan: Consider evaluating your carb choices on your plate based on their food group as we discussed Per your diet history and your typical insulin doses, I feel a safe carb ratio would be 3 units per carb choice (= 1 unit per every 5 grams) Consider going to [employer] and [employer] sites to see what they have to offer. ([email or web address]) ([email or web address])

## 2013-09-23 NOTE — Progress Notes (Signed)
Introduction to Insulin Pump Therapy:  Appt start time: 1630 end time:  1800.  Assessment:  This patient has DM 1 and his primary concerns today: He needs to understand Carb Counting better and is interested in learning more about insulin pumps.  This patient is interested in learning more about insulin pump therapy because he wants to improve his BG control and to me more educated about the factors that affect his BG.  MEDICATIONS: Basal Insulin: 80 units of Lantus at 40 in AM and 40 in PM Bolus Insulin: 6-8 units of Humalog after his meals based on his BG reading after he eats. Total of insulin doses per day 100 units plus or minus depending on BG numbers Other diabetes medications: Farxiga - off label  This patient is currently adjusting bolus insulin based BG at a correction ratio of 12 (which includes his BG reading after eating meal) This patient is not currently adjusting bolus insulin based on carb intake   Patient states knowledge of Carb Counting is very poor prior to this appointment  DIETARY INTAKE: 24-hr recall:  B ( AM): maybe a slice of toast or two, on weekends; eggs, bacon, toast with butter, coffee with 1 tsp sugar and creamer  Snk ( AM): occasionally PNB cracker package  L ( PM): bowl of soup from [employer] and 1 bag chips, occasionally more PNB crackers OR 6" Sub, no chips, water to drink  Snk ( PM): no D ( PM): eats at home, wife cooks variety of meals; spaghetti with 2 slices toast and water, meat, starch, vegetables, occasionally a salad too.  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      Intervention:    Taught difference between delivery of insulin via syringe/pen compared to insulin pump.  Demonstrated improved insulin delivery via pump due to improved accuracy of dose and flexibility of adjusting bolus insulin based on carb intake and BG correction.  Demonstrated pump, button pushing for bolus delivery of insulin through the pump  Explained importance of testing BG at least 4 times per day for appropriate correction of high BG and prevention of DKA as applicable.  Emphasized importance of follow up after Pump Start for appropriate pump setting adjustments and on-going training on more advanced features.  Explained to him that by testing after he eats his meal, his BG is rising based on his food intake and he is basing his insulin dose on a "moving target" that is not related to the amount of carb he may or may not have consumed. Encouraged him to give insulin before he eats his meal, based on carb total now that he understands carb counting better. Discussed Carb Counting in terms of food groups as method of portion control and insulin dosing. Per his diet history and his typical insulin doses, it appears he would benefit from taking 3  units of Humalog per Carb Choice ( = 1 unit / 5 grams).   Plan: Consider evaluating your carb choices on your plate based on their food group as we discussed Per your diet history and your typical insulin doses, I feel a safe carb ratio would be 3 units per carb choice (= 1 unit per every 5 grams) Consider going to [employer] and [employer] sites to see what they have to offer. ([email or web address]) ([email or web address])   Handouts given during visit  include:  Intro to Pumping Handout  Carb Counting Handout  Follow up: Patient states he will call the office tomorrow to set up follow up appointment with me.  Provided him the DM 1 Support Group Flyer and encouraged him to come and meet others who have a pump too.

## 2013-10-22 LAB — HEMOGLOBIN A1C: Hgb A1c MFr Bld: 7.3 % — AB (ref 4.0–6.0)

## 2013-10-31 LAB — PSA: PSA: 2.18

## 2013-11-05 ENCOUNTER — Telehealth: Payer: Self-pay | Admitting: Internal Medicine

## 2013-11-05 ENCOUNTER — Ambulatory Visit (INDEPENDENT_AMBULATORY_CARE_PROVIDER_SITE_OTHER): Payer: BC Managed Care – PPO | Admitting: Internal Medicine

## 2013-11-05 ENCOUNTER — Encounter: Payer: Self-pay | Admitting: Internal Medicine

## 2013-11-05 VITALS — BP 130/76 | HR 76 | Temp 97.1°F | Resp 16 | Ht 73.0 in | Wt 246.0 lb

## 2013-11-05 DIAGNOSIS — E108 Type 1 diabetes mellitus with unspecified complications: Secondary | ICD-10-CM

## 2013-11-05 DIAGNOSIS — Z789 Other specified health status: Secondary | ICD-10-CM

## 2013-11-05 DIAGNOSIS — Z888 Allergy status to other drugs, medicaments and biological substances status: Secondary | ICD-10-CM

## 2013-11-05 DIAGNOSIS — E785 Hyperlipidemia, unspecified: Secondary | ICD-10-CM

## 2013-11-05 MED ORDER — EZETIMIBE 10 MG PO TABS: 10.0000 mg | ORAL_TABLET | Freq: Every day | ORAL | Status: DC

## 2013-11-05 NOTE — Progress Notes (Signed)
Subjective:    Patient ID: Anthony Skains., male    DOB: June 09, 1951, 63 y.o.   MRN: [id number]  Hyperlipidemia This is a chronic problem. The current episode started more than 1 year ago. The problem is controlled. Recent lipid tests were reviewed and are variable. Exacerbating diseases include diabetes and obesity. He has no history of chronic renal disease, hypothyroidism, liver disease or nephrotic syndrome. Factors aggravating his hyperlipidemia include smoking and fatty foods. Associated symptoms include myalgias. Pertinent negatives include no chest pain, focal sensory loss, focal weakness, leg pain or shortness of breath. Current antihyperlipidemic treatment includes statins. The current treatment provides moderate improvement of lipids. Compliance problems include adherence to diet, adherence to exercise and medication side effects.       Review of Systems  Constitutional: Negative.  Negative for fever, chills, diaphoresis, appetite change and fatigue.  HENT: Negative.   Eyes: Negative.   Respiratory: Negative.  Negative for cough, choking, chest tightness, shortness of breath, wheezing and stridor.   Cardiovascular: Negative.  Negative for chest pain, palpitations and leg swelling.  Gastrointestinal: Negative.  Negative for nausea, vomiting, abdominal pain, diarrhea, constipation and blood in stool.  Endocrine: Negative.  Negative for polydipsia, polyphagia and polyuria.  Genitourinary: Negative.   Musculoskeletal: Positive for myalgias. Negative for arthralgias, back pain, gait problem, joint swelling, neck pain and neck stiffness.  Skin: Negative.   Allergic/Immunologic: Negative.   Neurological: Negative.  Negative for focal weakness.  Hematological: Negative.  Negative for adenopathy. Does not bruise/bleed easily.  Psychiatric/Behavioral: Negative.        Objective:   Physical Exam  Vitals reviewed. Constitutional: He is oriented to person, place, and time. He appears  well-developed and well-nourished. No distress.  HENT:  Head: Normocephalic and atraumatic.  Mouth/Throat: Oropharynx is clear and moist. No oropharyngeal exudate.  Eyes: Conjunctivae are normal. Right eye exhibits no discharge. Left eye exhibits no discharge. No scleral icterus.  Neck: Normal range of motion. Neck supple. No JVD present. No tracheal deviation present. No thyromegaly present.  Cardiovascular: Normal rate, regular rhythm, normal heart sounds and intact distal pulses.  Exam reveals no gallop and no friction rub.   No murmur heard. Pulmonary/Chest: Effort normal and breath sounds normal. No stridor. No respiratory distress. He has no wheezes. He has no rales. He exhibits no tenderness.  Abdominal: Soft. Bowel sounds are normal. He exhibits no distension and no mass. There is no tenderness. There is no rebound and no guarding.  Musculoskeletal: Normal range of motion. He exhibits no edema and no tenderness.  Lymphadenopathy:    He has no cervical adenopathy.  Neurological: He is oriented to person, place, and time.  Skin: Skin is warm and dry. No rash noted. He is not diaphoretic. No erythema. No pallor.  Psychiatric: He has a normal mood and affect. His behavior is normal. Judgment and thought content normal.    Lab Results  Component Value Date   WBC 10.2 10/31/2012   HGB 14.8 10/31/2012   HCT 41.7 10/31/2012   PLT 166 10/31/2012   GLUCOSE 90 07/01/2013   CHOL 120 03/04/2013   TRIG 59.0 03/04/2013   HDL 33.50* 03/04/2013   LDLCALC 75 03/04/2013   ALT 17 03/04/2013   AST 18 03/04/2013   NA 139 07/01/2013   K 4.2 07/01/2013   CL 106 07/01/2013   CREATININE 1.0 07/01/2013   BUN 12 07/01/2013   CO2 27 07/01/2013   TSH 0.64 06/26/2012   PSA 1.24 06/26/2012  HGBA1C 8.3* 07/01/2013        Assessment & Plan:

## 2013-11-05 NOTE — Patient Instructions (Signed)

## 2013-11-05 NOTE — Assessment & Plan Note (Signed)
His recent A1C with ENDO shows improved blood sugar control

## 2013-11-05 NOTE — Telephone Encounter (Signed)
Relevant patient education assigned to patient using Emmi. ° °

## 2013-11-05 NOTE — Assessment & Plan Note (Signed)
He does not tolerate statins Will start zetia

## 2013-11-05 NOTE — Progress Notes (Signed)
Pre visit review using our clinic review tool, if applicable. No additional management support is needed unless otherwise documented below in the visit note. 

## 2013-11-15 ENCOUNTER — Telehealth: Payer: Self-pay

## 2013-11-15 NOTE — Telephone Encounter (Signed)
Relevant patient education assigned to patient using Emmi. ° °

## 2014-01-07 LAB — LIPID PANEL
Cholesterol: 151 mg/dL (ref 0–200)
HDL: 34 mg/dL — AB (ref 35–70)
LDL Cholesterol: 103 mg/dL
LDl/HDL Ratio: 4.5
Triglycerides: 71 mg/dL (ref 40–160)

## 2014-01-07 LAB — BASIC METABOLIC PANEL
BUN: 13 mg/dL (ref 4–21)
Creatinine: 1 mg/dL (ref 0.6–1.3)
Creatinine: 1 mg/dL (ref <=1.3)
Glucose: 118 mg/dL
Glucose: 99 mg/dL
Potassium: 4.6 mmol/L (ref 3.4–5.3)
Sodium: 140 mmol/L (ref 137–147)

## 2014-01-07 LAB — HEPATIC FUNCTION PANEL
ALT: 15 U/L (ref 10–40)
AST: 13 U/L — AB (ref 14–40)
Alkaline Phosphatase: 84 U/L (ref 25–125)
Bilirubin, Total: 0.4 mg/dL

## 2014-01-07 LAB — HEMOGLOBIN A1C
Hgb A1c MFr Bld: 7.3 % — AB (ref 4.0–6.0)
Hgb A1c MFr Bld: 7.4 % — AB (ref 4.0–6.0)

## 2014-01-07 LAB — TSH: TSH: 0.51 u[IU]/mL (ref <=5.90)

## 2014-01-27 ENCOUNTER — Encounter: Payer: Self-pay | Admitting: Internal Medicine

## 2014-02-06 LAB — HM DIABETES EYE EXAM

## 2014-03-04 LAB — PSA: PSA: 2.21

## 2014-03-07 ENCOUNTER — Encounter: Payer: Self-pay | Admitting: Internal Medicine

## 2014-03-07 ENCOUNTER — Ambulatory Visit (INDEPENDENT_AMBULATORY_CARE_PROVIDER_SITE_OTHER): Payer: BC Managed Care – PPO | Admitting: Internal Medicine

## 2014-03-07 VITALS — BP 120/68 | HR 76 | Temp 98.0°F | Resp 16 | Ht 73.0 in | Wt 235.0 lb

## 2014-03-07 DIAGNOSIS — Z23 Encounter for immunization: Secondary | ICD-10-CM

## 2014-03-07 DIAGNOSIS — R972 Elevated prostate specific antigen [PSA]: Secondary | ICD-10-CM

## 2014-03-07 DIAGNOSIS — E108 Type 1 diabetes mellitus with unspecified complications: Secondary | ICD-10-CM

## 2014-03-07 DIAGNOSIS — E785 Hyperlipidemia, unspecified: Secondary | ICD-10-CM

## 2014-03-07 NOTE — Assessment & Plan Note (Signed)
He will not take a statin 

## 2014-03-07 NOTE — Patient Instructions (Signed)
Type 1 Diabetes Mellitus Type 1 diabetes mellitus, often simply referred to as diabetes, is a long-term (chronic) disease. It occurs when the islet cells in the pancreas that make insulin (a hormone) are destroyed and can no longer make insulin. Insulin is needed to move sugars from food into the tissue cells. The tissue cells use the sugars for energy. In people with type 1 diabetes, the sugars build up in the blood instead of going into the tissue cells. As a result, high blood sugar (hyperglycemia) develops. Without insulin, the body breaks down fat cells for the needed energy. This breakdown of fat cells produces acid chemicals (ketones), which increases the acid levels in the body. The effect of either high ketone or high sugar (glucose) levels can be life-threatening.  Type 1 diabetes was also previously called juvenile diabetes. It most often occurs before the age of 30, but it can occur at any age. RISK FACTORS A person is predisposed to developing type 1 diabetes if someone in his or her family has the disease and is exposed to certain additional environmental triggers.  SYMPTOMS  Symptoms of type 1 diabetes may develop gradually over days to weeks or suddenly. The symptoms occur due to hyperglycemia. The symptoms can include:   Increased thirst (polydipsia).  Increased urination (polyuria).  Increased urination during the night (nocturia).  Weight loss. This weight loss may be rapid.  Frequent, recurring infections.  Tiredness (fatigue).  Weakness.  Vision changes, such as blurred vision.  Fruity smell to your breath.  Abdominal pain.  Nausea or vomiting. DIAGNOSIS  Type 1 diabetes is diagnosed when symptoms of diabetes are present and when blood glucose levels are increased. Your blood glucose level may be checked by one or more of the following blood tests:  A fasting blood glucose test. You will not be allowed to eat for at least 8 hours before a blood sample is  taken.  A random blood glucose test. Your blood glucose is checked at any time of the day regardless of when you ate.  A hemoglobin A1c blood glucose test. A hemoglobin A1c test provides information about blood glucose control over the previous 3 months. TREATMENT  Although type 1 diabetes cannot be prevented, it can be managed with insulin, diet, and exercise.  You will need to take insulin daily to keep blood glucose in the desired range.  You will need to match insulin dosing with exercise and healthy food choices. The treatment goal is to maintain the before-meal blood sugar (preprandial glucose) level at 70-130 mg/dL.  HOME CARE INSTRUCTIONS   Have your hemoglobin A1c level checked twice a year.  Perform daily blood glucose monitoring as directed by your health care provider.  Monitor urine ketones when you are ill and as directed by your health care provider.  Take your insulin as directed by your health care provider to maintain your blood glucose level in the desired range.  Never run out of insulin. It is needed every day.  Adjust insulin based on your intake of carbohydrates. Carbohydrates can raise blood glucose levels but need to be included in your diet. Carbohydrates provide vitamins, minerals, and fiber, which are an essential part of a healthy diet. Carbohydrates are found in fruits, vegetables, whole grains, dairy products, legumes, and foods containing added sugars.  Eat healthy foods. Alternate 3 meals with 3 snacks.  Maintain a healthy weight.  Carry a medical alert card or wear your medical alert jewelry.  Carry a 15-gram carbohydrate snack   with you at all times to treat low blood glucose (hypoglycemia). Some examples of 15-gram carbohydrate snacks include:  Glucose tablets, 3 or 4.  Glucose gel, 15-gram tube.  Raisins, 2 tablespoons (24 grams).  Jelly beans, 6.  Animal crackers, 8.  Fruit juice, regular soda, or low-fat milk, 4 ounces (120  mL).  Gummy treats, 9.  Recognize hypoglycemia. Hypoglycemia occurs with blood glucose levels of 70 mg/dL and below. The risk for hypoglycemia increases when fasting or skipping meals, during or after intense exercise, and during sleep. Hypoglycemia symptoms can include:  Tremors or shakes.  Decreased ability to concentrate.  Sweating.  Increased heart rate.  Headache.  Dry mouth.  Hunger.  Irritability.  Anxiety.  Restless sleep.  Altered speech or coordination.  Confusion.  Treat hypoglycemia promptly. If you are alert and able to safely swallow, follow the 15:15 rule:  Take 15-20 grams of rapid-acting glucose or carbohydrate. Rapid-acting options include glucose gel, glucose tablets, or 4 ounces (120 mL) of fruit juice, regular soda, or low-fat milk.  Check your blood glucose level 15 minutes after taking the glucose.  Take 15-20 grams more of glucose if the repeat blood glucose level is still 70 mg/dL or below.  Eat a meal or snack within 1 hour once blood glucose levels return to normal.  Be alert to polyuria and polydipsia, which are early signs of hyperglycemia. An early awareness of hyperglycemia allows for prompt treatment. Treat hyperglycemia as directed by your health care provider.  Exercise regularly as directed by your health care provider. This includes:  Performing resistance training twice a week such as push-ups, sit-ups, lifting weights, or using resistance bands.  Performing 150 minutes of cardio exercises each week such as walking, running, or playing sports.  Staying active and spending no more than 90 minutes at one time being inactive.  Adjust your insulin dosing and food intake as needed if you start a new exercise or sport.  Follow your sick-day plan at any time you are unable to eat or drink as usual.   Do not use any tobacco products including cigarettes, chewing tobacco, or electronic cigarettes. If you need help quitting, ask your  health care provider.  Limit alcohol intake to no more than 1 drink per day for nonpregnant women and 2 drinks per day for men. You should drink alcohol only when you are also eating food. Talk with your health care provider about whether alcohol is safe for you. Tell your health care provider if you drink alcohol several times a week.  Keep all follow-up visits as directed by your health care provider.  Schedule an eye exam within 5 years of diagnosis and then annually.  Perform daily skin and foot care. Examine your skin and feet daily for cuts, bruises, redness, nail problems, bleeding, blisters, or sores. A foot exam by a health care provider should be done annually.  Brush your teeth and gums at least twice a day and floss at least once a day. Follow up with your dentist regularly.  Share your diabetes management plan with your workplace or school.  Stay up-to-date with immunizations. It is recommended that people with diabetes who are over 42 years old get the pneumonia vaccine. In some cases, two separate shots may be given. Ask your health care provider if your pneumonia vaccination is up-to-date.  Learn to manage stress.  Obtain ongoing diabetes education and support as needed.  Participate in or seek rehabilitation as needed to maintain or improve independence  and quality of life. Request a physical or occupational therapy referral if you are having foot or hand numbness, or difficulties with grooming, dressing, eating, or physical activity. SEEK MEDICAL CARE IF:   You are unable to eat food or drink fluids for more than 6 hours.  You have nausea and vomiting for more than 6 hours.  Your blood glucose level is over 240 mg/dL.  There is a change in mental status.  You develop an additional serious illness.  You have diarrhea for more than 6 hours.  You have been sick or have had a fever for a couple of days and are not getting better.  You have pain during any physical  activity. SEEK IMMEDIATE MEDICAL CARE IF:  You have difficulty breathing.  You have moderate to large ketone levels. MAKE SURE YOU:  Understand these instructions.  Will watch your condition.  Will get help right away if you are not doing well or get worse. Document Released: 07/01/2000 Document Revised: 11/18/2013 Document Reviewed: 01/31/2012 Exit[employer] Patient Information 2015 [employer]. This information is not intended to replace advice given to you by your health care provider. Make sure you discuss any questions you have with your health care provider.  

## 2014-03-07 NOTE — Assessment & Plan Note (Signed)
His blood sugars have been well controlled

## 2014-03-07 NOTE — Progress Notes (Signed)
Pre visit review using our clinic review tool, if applicable. No additional management support is needed unless otherwise documented below in the visit note. 

## 2014-03-07 NOTE — Progress Notes (Signed)
Subjective:    Patient ID: Anthony Skains., male    DOB: [birth year], 63 y.o.   MRN: [id number]  Diabetes He presents for his follow-up diabetic visit. He has type 1 diabetes mellitus. His disease course has been fluctuating. There are no hypoglycemic associated symptoms. Associated symptoms include foot paresthesias. Pertinent negatives for diabetes include no blurred vision, no chest pain, no fatigue, no foot ulcerations, no polydipsia, no polyphagia, no polyuria, no visual change, no weakness and no weight loss. There are no hypoglycemic complications. Diabetic complications include peripheral neuropathy. Current diabetic treatment includes intensive insulin program, insulin injections and oral agent (monotherapy). He is compliant with treatment all of the time. His weight is stable. He is following a generally healthy diet. Meal planning includes avoidance of concentrated sweets. He participates in exercise intermittently. There is no change in his home blood glucose trend. An ACE inhibitor/angiotensin II receptor blocker is being taken. He does not see a podiatrist.Eye exam is current.      Review of Systems  Constitutional: Negative.  Negative for fever, chills, weight loss, diaphoresis, appetite change and fatigue.  HENT: Negative.   Eyes: Negative.  Negative for blurred vision.  Respiratory: Negative for cough, choking, chest tightness, shortness of breath, wheezing and stridor.   Cardiovascular: Negative.  Negative for chest pain, palpitations and leg swelling.  Gastrointestinal: Negative.  Negative for nausea, vomiting, abdominal pain, diarrhea, constipation and blood in stool.  Endocrine: Negative.  Negative for polydipsia, polyphagia and polyuria.  Genitourinary: Negative.   Musculoskeletal: Negative.  Negative for arthralgias, back pain, gait problem, joint swelling, myalgias, neck pain and neck stiffness.  Skin: Negative.  Negative for rash.  Allergic/Immunologic: Negative.     Neurological: Negative.  Negative for weakness.  Hematological: Negative.  Negative for adenopathy. Does not bruise/bleed easily.  Psychiatric/Behavioral: Negative.        Objective:   Physical Exam  Vitals reviewed. Constitutional: He is oriented to person, place, and time. He appears well-developed and well-nourished. No distress.  HENT:  Head: Normocephalic and atraumatic.  Mouth/Throat: Oropharynx is clear and moist. No oropharyngeal exudate.  Eyes: Conjunctivae are normal. Right eye exhibits no discharge. Left eye exhibits no discharge. No scleral icterus.  Neck: Normal range of motion. Neck supple. No JVD present. No tracheal deviation present. No thyromegaly present.  Cardiovascular: Normal rate, regular rhythm, normal heart sounds and intact distal pulses.  Exam reveals no gallop and no friction rub.   No murmur heard. Pulmonary/Chest: Effort normal and breath sounds normal. No stridor. No respiratory distress. He has no wheezes. He has no rales. He exhibits no tenderness.  Abdominal: Soft. Bowel sounds are normal. He exhibits no distension and no mass. There is no tenderness. There is no rebound and no guarding.  Musculoskeletal: Normal range of motion. He exhibits no edema and no tenderness.  Lymphadenopathy:    He has no cervical adenopathy.  Neurological: He is oriented to person, place, and time.  Skin: Skin is warm and dry. No rash noted. He is not diaphoretic. No erythema. No pallor.     Lab Results  Component Value Date   WBC 10.2 10/31/2012   HGB 14.8 10/31/2012   HCT 41.7 10/31/2012   PLT 166 10/31/2012   GLUCOSE 90 07/01/2013   CHOL 151 01/07/2014   TRIG 71 01/07/2014   HDL 34* 01/07/2014   LDLCALC 103 01/07/2014   ALT 15 01/07/2014   AST 13* 01/07/2014   NA 140 01/07/2014   K 4.6 01/07/2014  CL 106 07/01/2013   CREATININE 1.0 01/07/2014   CREATININE 1.0 01/07/2014   BUN 13 01/07/2014   CO2 27 07/01/2013   TSH 0.51 01/07/2014   PSA 1.24 06/26/2012   HGBA1C 7.3*  01/07/2014   HGBA1C 7.4* 01/07/2014       Assessment & Plan:

## 2014-03-11 ENCOUNTER — Ambulatory Visit (INDEPENDENT_AMBULATORY_CARE_PROVIDER_SITE_OTHER): Payer: BC Managed Care – PPO | Admitting: Internal Medicine

## 2014-03-11 DIAGNOSIS — R972 Elevated prostate specific antigen [PSA]: Secondary | ICD-10-CM

## 2014-03-13 NOTE — Progress Notes (Signed)
   Subjective:    Patient ID: Anthony Skains., male    DOB: 01-16-1951, 63 y.o.   MRN: [id number]  HPI  Not seen  Review of Systems     Objective:   Physical Exam        Assessment & Plan:

## 2014-04-01 ENCOUNTER — Other Ambulatory Visit: Payer: Self-pay | Admitting: Internal Medicine

## 2014-04-10 ENCOUNTER — Other Ambulatory Visit: Payer: Self-pay

## 2014-04-10 MED ORDER — DAPAGLIFLOZIN PROPANEDIOL 10 MG PO TABS: ORAL_TABLET | ORAL | Status: DC

## 2014-04-21 ENCOUNTER — Other Ambulatory Visit: Payer: Self-pay

## 2014-04-21 MED ORDER — DAPAGLIFLOZIN PROPANEDIOL 10 MG PO TABS: ORAL_TABLET | ORAL | Status: DC

## 2014-05-12 ENCOUNTER — Encounter: Payer: BC Managed Care – PPO | Attending: Internal Medicine | Admitting: *Deleted

## 2014-05-12 DIAGNOSIS — Z713 Dietary counseling and surveillance: Secondary | ICD-10-CM | POA: Diagnosis not present

## 2014-05-12 DIAGNOSIS — E108 Type 1 diabetes mellitus with unspecified complications: Secondary | ICD-10-CM

## 2014-05-12 DIAGNOSIS — Z794 Long term (current) use of insulin: Secondary | ICD-10-CM | POA: Insufficient documentation

## 2014-05-12 NOTE — Progress Notes (Signed)
Introduction to Insulin Pump Therapy:  Appt start time: 0930 end time:  1030.  Assessment:  This patient has DM 1 and his primary concerns today: Follow up visit to help him understand Carb Counting better and to help decide which insulin pump to order.  This patient is interested in learning more about insulin pump therapy because he wants to improve his BG control and to me more educated about the factors that affect his BG.  MEDICATIONS: Basal Insulin: 70 units of Lantus at 35 in AM and 35 in PM Bolus Insulin: 5 or more units of Humalog now before his meals based on his BG reading  Total of insulin doses per day 85 units plus or minus depending on BG numbers Other diabetes medications: Farxiga - off label  This patient is currently adjusting bolus insulin based BG at a correction ratio of 12  This patient is not currently adjusting bolus insulin based on carb intake   Patient states knowledge of Carb Counting is very poor prior to this appointment  DIETARY INTAKE: per last visit 24-hr recall:  B ( AM): maybe a slice of toast or two, on weekends; eggs, bacon, toast with butter, coffee with 1 tsp sugar and creamer  Snk ( AM): occasionally PNB cracker package  L ( PM): bowl of soup from [employer] and 1 bag chips, occasionally more PNB crackers OR 6" Sub, no chips, water to drink  Snk ( PM): no D ( PM): eats at home, wife cooks variety of meals; spaghetti with 2 slices toast and water, meat, starch, vegetables, occasionally a salad too.  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      Intervention:    Reviewed difference between delivery of insulin via syringe/pen compared to insulin pump.  Demonstrated improved insulin delivery via pump due to improved accuracy of dose and flexibility of adjusting bolus insulin based on carb intake and BG correction.  Demonstrated pump, button pushing for bolus delivery of insulin through the pump  Reviewed importance of testing BG at least 4 times per day for appropriate correction of high BG and prevention of DKA as applicable.  Emphasized importance of follow up after Pump Start for appropriate pump setting adjustments and on-going training on more advanced features.  Discussed value and rationale for CGM, value of trends and alerts to help him be more pro-active. Reviewed Carb Counting in terms of food groups as method of portion control and insulin dosing. Per his diet history and his typical insulin doses, it appears he would benefit from taking 3 units of Humalog per Carb Choice ( = 1 unit / 5 grams).   Plan: Continue practicing with your carb choices on your plate based on their food group as we discussed You have chosen the T-slim G4 insulin pump and CGM. With your permission I have contacted the  [employer] Clinical Manager via email to provide her with your contact information to start this process.   Handouts given during visit include:  Intro to Pumping Handout  Carb Counting Handout  Follow up: Patient instructed to make appointment in this office as soon as he knows his pump is shipping so training will be timely for him.

## 2014-05-14 LAB — HEMOGLOBIN A1C: Hgb A1c MFr Bld: 7.1 % — AB (ref 4.0–6.0)

## 2014-06-23 ENCOUNTER — Ambulatory Visit: Payer: BC Managed Care – PPO | Admitting: Internal Medicine

## 2014-06-26 ENCOUNTER — Ambulatory Visit: Payer: BC Managed Care – PPO | Admitting: *Deleted

## 2014-07-03 ENCOUNTER — Encounter: Payer: BC Managed Care – PPO | Attending: Internal Medicine | Admitting: *Deleted

## 2014-07-03 DIAGNOSIS — E108 Type 1 diabetes mellitus with unspecified complications: Secondary | ICD-10-CM

## 2014-07-03 DIAGNOSIS — Z713 Dietary counseling and surveillance: Secondary | ICD-10-CM | POA: Insufficient documentation

## 2014-07-03 DIAGNOSIS — Z794 Long term (current) use of insulin: Secondary | ICD-10-CM | POA: Insufficient documentation

## 2014-07-03 NOTE — Progress Notes (Signed)
Insulin Pump Start Progress Note:07/03/14  Patient appointment start time: 1700  End time 1900  Patient here for insulin pump start on T-Flex pump and Inset infusion set Orders with pump settings received from MD  Reviewed Pump Set Up including  Menu Settings  Bolus with Carb Ratio of 1 unit / 6 grams Carb, Correction Factor of 1 unit / 22 mg/dl  Suspend  Basal with initial Basal Rate of 1.65 units/hour  Reservoir Set Up   Pump Training Checklist completed  Patient is to call or text me within 24 hours with update.  Patient successfully completed pump start and instructed to call me as needed for questions or concerns  Follow up plan: follow up visit within 2 weeks.

## 2014-07-09 ENCOUNTER — Telehealth: Payer: Self-pay | Admitting: *Deleted

## 2014-07-09 NOTE — Telephone Encounter (Signed)
07/09/14 Pump Follow Up Progress Note  Orders received from MD giving me permission to make insulin pump adjustments for the following patient.  Reviewed blood glucose logs on 07/09/14 via: [employer]Connect and found the following:            Hypoglycemia Hyperglycemia Comments  Overnight Period:   YES Basal  Pre-Meal:    Breakfast  YES Basal   Lunch  YES    Supper  YES   Post-Meal: Breakfast  YES Basal   Lunch  YES    Supper  YES   Bedtime:   YES Basal   Comments: Patient states he is happy with pump but is not happy with high BG's so far. I noticed that he is not bolusing for meals as directed, he states he is going to work on that. Since all BG's are above target and Correction dose is not bringing him back to target range, I assisted him in increasing his Basal Rate and ISF over the phone which he did successfully. See below  Pump Settings: Date: Current Date: 07/09/14  Changes in bold print   Basal Rate: Carb Ratio Sensitivity  Basal Rate: Carb Ratio Sensitivity   MN: 1.65 6 22 MN: 1.80  (+) 6 20  (+)                                                         Plan: patient to continue to bolus for corrections and consider bolusing for meals more often. Back up plan of 45 grams suggested if he is unsure of carb content of meal.   Follow up:  Patient to upload to [employer]-Connect within 7 days for further review Appointment made for follow up visit next Wednesday, 07/16/14 to review reports and provide further support

## 2014-07-16 ENCOUNTER — Encounter: Payer: BC Managed Care – PPO | Admitting: *Deleted

## 2014-07-16 DIAGNOSIS — E108 Type 1 diabetes mellitus with unspecified complications: Secondary | ICD-10-CM

## 2014-07-17 NOTE — Progress Notes (Signed)
  Pump Follow Up Progress Note  Orders received from MD giving me permission to make insulin pump adjustments for the following patient.  Reviewed blood glucose logs on 07/16/14 via:  [employer] Connect Reports and found the following:          Hypoglycemia Hyperglycemia Comments  Overnight Period:   YES BASAL  Pre-Meal:    Breakfast      Lunch      Supper     Post-Meal: Breakfast  YES ICR / ISF   Lunch  YES ICR / ISF   Supper  YES ICR / ISF   Bedtime:       Comments: Average BG 100 mg/dl above target and post meal BG's more than 40 mg/dl above pre-meal BG. Plan to increase Basal rat by 10% and both ICR and ISF at this visit per below. Patient is in agreement.  He is doing well with operation of pump and is interested in use of Temp Basal to decrease basal delivery for activity level or if going low before ready to eat a meal.   Pump Settings: Date: Current Date: 07/16/14 changes in bold print   Basal Rate: Carb Ratio Sensitivity  Basal Rate: Carb Ratio Sensitivity   MN: 1.8 6 20  MN: 2.0  (+) 5  (+) 18  (+)                                                         Plan: we made the above adjustments while in my office. Patient to continue to use bolus feature for all meals and any correction doses needed. Patient to stop giving additional insulin with insulin pen if he feels he needs more than the pump offers and to let me know if BG are too high so we can adjust the settings accordingly.  He is planning to start on CGM now, so appointment made for 2 weeks.  Follow up:  Patient to upload to [employer]-Connect within 1-2 weeks for further review Appointment scheduled for start on Dexcom CGM in 2 weeks

## 2014-07-22 ENCOUNTER — Ambulatory Visit (INDEPENDENT_AMBULATORY_CARE_PROVIDER_SITE_OTHER): Payer: BC Managed Care – PPO | Admitting: Internal Medicine

## 2014-07-22 ENCOUNTER — Encounter: Payer: Self-pay | Admitting: Internal Medicine

## 2014-07-22 VITALS — BP 128/80 | HR 69 | Temp 98.3°F | Resp 16 | Ht 73.0 in | Wt 241.0 lb

## 2014-07-22 DIAGNOSIS — E139 Other specified diabetes mellitus without complications: Secondary | ICD-10-CM

## 2014-07-22 DIAGNOSIS — E785 Hyperlipidemia, unspecified: Secondary | ICD-10-CM

## 2014-07-22 NOTE — Assessment & Plan Note (Signed)
His blood sugars are well controlled He sees Dr. Buddy Duty tomorrow

## 2014-07-22 NOTE — Assessment & Plan Note (Signed)
He is doing well on livalo 

## 2014-07-22 NOTE — Patient Instructions (Signed)

## 2014-07-22 NOTE — Progress Notes (Signed)
Subjective:    Patient ID: Anthony Skains., male    DOB: 14-Dec-1950, 64 y.o.   MRN: [id number]  Diabetes He presents for his follow-up diabetic visit. He has type 2 diabetes mellitus. His disease course has been improving. There are no hypoglycemic associated symptoms. Pertinent negatives for diabetes include no blurred vision, no chest pain, no fatigue, no foot paresthesias, no foot ulcerations, no polydipsia, no polyphagia, no polyuria, no visual change, no weakness and no weight loss. There are no hypoglycemic complications. Diabetic complications include retinopathy. Current diabetic treatment includes oral agent (dual therapy) and insulin pump. He is compliant with treatment all of the time. His weight is stable. He is following a generally healthy diet. Meal planning includes avoidance of concentrated sweets. He participates in exercise intermittently. There is no change in his home blood glucose trend. An ACE inhibitor/angiotensin II receptor blocker is being taken. He does not see a podiatrist.Eye exam is current.      Review of Systems  Constitutional: Negative.  Negative for chills, weight loss, diaphoresis and fatigue.  HENT: Negative.   Eyes: Negative.  Negative for blurred vision.  Respiratory: Negative.  Negative for cough, choking, chest tightness, shortness of breath and stridor.   Cardiovascular: Negative.  Negative for chest pain, palpitations and leg swelling.  Gastrointestinal: Negative.  Negative for nausea, vomiting, abdominal pain, diarrhea, constipation and blood in stool.  Endocrine: Negative.  Negative for polydipsia, polyphagia and polyuria.  Genitourinary: Negative.   Musculoskeletal: Negative.  Negative for myalgias, back pain, joint swelling and arthralgias.  Skin: Negative.  Negative for rash.  Allergic/Immunologic: Negative.   Neurological: Negative.  Negative for weakness.  Hematological: Negative.  Negative for adenopathy. Does not bruise/bleed easily.    Psychiatric/Behavioral: Negative.        Objective:   Physical Exam  Constitutional: He is oriented to person, place, and time. He appears well-developed and well-nourished. No distress.  HENT:  Head: Normocephalic and atraumatic.  Mouth/Throat: Oropharynx is clear and moist. No oropharyngeal exudate.  Eyes: Conjunctivae are normal. Right eye exhibits no discharge. Left eye exhibits no discharge. No scleral icterus.  Neck: Normal range of motion. Neck supple. No JVD present. No tracheal deviation present. No thyromegaly present.  Cardiovascular: Normal rate, regular rhythm, normal heart sounds and intact distal pulses.  Exam reveals no gallop and no friction rub.   No murmur heard. Pulmonary/Chest: Effort normal and breath sounds normal. No stridor. No respiratory distress. He has no wheezes. He has no rales. He exhibits no tenderness.  Abdominal: Soft. Bowel sounds are normal. He exhibits no distension and no mass. There is no tenderness. There is no rebound and no guarding.  Musculoskeletal: Normal range of motion. He exhibits no edema or tenderness.  Lymphadenopathy:    He has no cervical adenopathy.  Neurological: He is oriented to person, place, and time.  Skin: Skin is warm and dry. No rash noted. He is not diaphoretic. No erythema. No pallor.  Vitals reviewed.    Lab Results  Component Value Date   WBC 10.2 10/31/2012   HGB 14.8 10/31/2012   HCT 41.7 10/31/2012   PLT 166 10/31/2012   GLUCOSE 90 07/01/2013   CHOL 151 01/07/2014   TRIG 71 01/07/2014   HDL 34* 01/07/2014   LDLCALC 103 01/07/2014   ALT 15 01/07/2014   AST 13* 01/07/2014   NA 140 01/07/2014   K 4.6 01/07/2014   CL 106 07/01/2013   CREATININE 1.0 01/07/2014   CREATININE 1.0  01/07/2014   BUN 13 01/07/2014   CO2 27 07/01/2013   TSH 0.51 01/07/2014   PSA 1.24 06/26/2012   HGBA1C 7.3* 01/07/2014   HGBA1C 7.4* 01/07/2014       Assessment & Plan:

## 2014-07-23 LAB — HEMOGLOBIN A1C: Hgb A1c MFr Bld: 8.1 % — AB (ref 4.0–6.0)

## 2014-07-25 ENCOUNTER — Ambulatory Visit: Payer: BC Managed Care – PPO | Admitting: *Deleted

## 2014-07-30 ENCOUNTER — Encounter: Payer: BC Managed Care – PPO | Attending: Internal Medicine | Admitting: *Deleted

## 2014-07-30 DIAGNOSIS — Z713 Dietary counseling and surveillance: Secondary | ICD-10-CM | POA: Insufficient documentation

## 2014-07-30 DIAGNOSIS — E108 Type 1 diabetes mellitus with unspecified complications: Secondary | ICD-10-CM | POA: Insufficient documentation

## 2014-07-30 DIAGNOSIS — E139 Other specified diabetes mellitus without complications: Secondary | ICD-10-CM

## 2014-07-30 DIAGNOSIS — Z794 Long term (current) use of insulin: Secondary | ICD-10-CM | POA: Diagnosis not present

## 2014-08-05 NOTE — Progress Notes (Signed)
CGM Training: 07/30/14  Appt start time: 1400 end time:1530.  Assessment:  Primary concerns today: Patient here for initiation of Dexcom Continuous Glucose Monitoring. He is doing well on his T-flex insulin pump that he has been on for about a month. He is looking forward to getting the data and alerts from the Dexcom CGM.  Medications: see list     Intervention:   Understanding Glucose Sensing Programming Sensor Information  High Glucose: 300 mg/dl  Low Glucose 80 mg/dl  Other settings to be added at follow up visit Starting [employer]  Entering BG, Calibration Technique and Graphs with Education officer, community and Alarms  We had an Alert that the Transmitter was not communicating with the pump. We called the Help Line and they apologized and said it had failed. They stated they would send another one Priority Mail to arrive by 10 AM the next day. They also stated that he already received a 2nd transmitter and he was welcome to start that one in the meantime if he wanted to. Patient chose to start the second transmitter when he got home. He notified me later that night that he was successful and was getting the data.   Follow Up Patient to see MD for insulin dose adjustments and to schedule visit with me for CGM follow up within 2 weeks.

## 2014-09-06 ENCOUNTER — Other Ambulatory Visit: Payer: Self-pay | Admitting: Internal Medicine

## 2014-11-05 LAB — PSA: PSA: 2.58

## 2014-11-10 ENCOUNTER — Ambulatory Visit (INDEPENDENT_AMBULATORY_CARE_PROVIDER_SITE_OTHER): Payer: BC Managed Care – PPO | Admitting: Internal Medicine

## 2014-11-10 ENCOUNTER — Encounter: Payer: Self-pay | Admitting: Internal Medicine

## 2014-11-10 ENCOUNTER — Ambulatory Visit (INDEPENDENT_AMBULATORY_CARE_PROVIDER_SITE_OTHER)
Admission: RE | Admit: 2014-11-10 | Discharge: 2014-11-10 | Disposition: A | Discharge status: Home / Self Care | Payer: BC Managed Care – PPO | Source: Ambulatory Visit | Attending: Internal Medicine | Admitting: Internal Medicine

## 2014-11-10 VITALS — BP 134/78 | HR 70 | Temp 98.1°F | Resp 14 | Wt 252.0 lb

## 2014-11-10 DIAGNOSIS — M542 Cervicalgia: Secondary | ICD-10-CM

## 2014-11-10 DIAGNOSIS — M5030 Other cervical disc degeneration, unspecified cervical region: Secondary | ICD-10-CM | POA: Insufficient documentation

## 2014-11-10 MED ORDER — MELOXICAM 10 MG PO CAPS: 1.0000 | ORAL_CAPSULE | Freq: Every day | ORAL | Status: DC

## 2014-11-10 NOTE — Patient Instructions (Signed)

## 2014-11-10 NOTE — Progress Notes (Signed)
Pre visit review using our clinic review tool, if applicable. No additional management support is needed unless otherwise documented below in the visit note. 

## 2014-11-10 NOTE — Assessment & Plan Note (Signed)
Exam is WNL Plain films show DDD Will try nsaids for symptom relief, if needed will refer to PT as well for symptom relief

## 2014-11-10 NOTE — Progress Notes (Signed)
Subjective:    [name]., male    DOB: 1951/05/06, 64 y.o.   MRN: [id number]  Neck Pain  This is a recurrent problem. The current episode started more than 1 month ago (intermittently for 3-4 months). The problem occurs intermittently. The problem has been unchanged. The pain is associated with nothing. The pain is present in the midline. The quality of the pain is described as aching. The pain is at a severity of 2/10. The pain is mild. Nothing aggravates the symptoms. The pain is same all the time. Pertinent negatives include no chest pain, fever, headaches, leg pain, numbness, pain with swallowing, paresis, photophobia, syncope, tingling, trouble swallowing, visual change, weakness or weight loss. He has tried acetaminophen for the symptoms. The treatment provided mild relief.      Review of Systems  Constitutional: Negative.  Negative for fever, chills, weight loss, diaphoresis, appetite change and fatigue.  HENT: Negative.  Negative for trouble swallowing.   Eyes: Negative.  Negative for photophobia.  Respiratory: Negative.   Cardiovascular: Negative.  Negative for chest pain, palpitations, leg swelling and syncope.  Gastrointestinal: Negative.  Negative for abdominal pain.  Endocrine: Negative.   Genitourinary: Negative.   Musculoskeletal: Positive for neck pain. Negative for back pain and arthralgias.  Skin: Negative.   Allergic/Immunologic: Negative.   Neurological: Negative.  Negative for dizziness, tingling, weakness, numbness and headaches.  Hematological: Negative.  Negative for adenopathy. Does not bruise/bleed easily.  Psychiatric/Behavioral: Negative.        Objective:   Physical Exam  Constitutional: He is oriented to person, place, and time. He appears well-developed and well-nourished. No distress.  HENT:  Head: Normocephalic and atraumatic.  Mouth/Throat: Oropharynx is clear and moist. No oropharyngeal exudate.  Eyes: Conjunctivae are  normal. Right eye exhibits no discharge. Left eye exhibits no discharge. No scleral icterus.  Neck: Normal range of motion. Neck supple. No JVD present. No tracheal deviation present. No thyromegaly present.  Cardiovascular: Normal rate, regular rhythm, normal heart sounds and intact distal pulses.  Exam reveals no gallop and no friction rub.   No murmur heard. Pulmonary/Chest: Effort normal and breath sounds normal. No stridor. No respiratory distress. He has no wheezes. He has no rales. He exhibits no tenderness.  Abdominal: Soft. Bowel sounds are normal. He exhibits no distension and no mass. There is no tenderness. There is no rebound and no guarding.  Musculoskeletal: Normal range of motion. He exhibits no edema.       Cervical back: He exhibits normal range of motion, no tenderness, no bony tenderness, no swelling, no edema, no deformity, no pain and no spasm.  Lymphadenopathy:    He has no cervical adenopathy.  Neurological: He is alert and oriented to person, place, and time. He has normal strength. He displays no atrophy and no tremor. No cranial nerve deficit or sensory deficit. He exhibits normal muscle tone. He displays a negative Romberg sign. He displays no seizure activity. Coordination and gait normal.  Reflex Scores:      Tricep reflexes are 1+ on the right side and 1+ on the left side.      Bicep reflexes are 1+ on the right side and 1+ on the left side.      Brachioradialis reflexes are 1+ on the right side and 1+ on the left side.      Patellar reflexes are 1+ on the right side and 1+ on the left side. Skin: Skin is warm and dry. No  rash noted. He is not diaphoretic. No erythema. No pallor.  Vitals reviewed.    Dg Cervical Spine Complete  11/10/2014   CLINICAL DATA:  Bilateral neck pain for 7 days.  No known injury.  EXAM: CERVICAL SPINE  4+ VIEWS  COMPARISON:  None.  FINDINGS: Diffuse multilevel degenerative change noted. Mild cervical spine straightening, most likely  degenerative. Neural foramina patent. No acute bony abnormality. Pulmonary apices are clear.  IMPRESSION: Diffuse degenerative changes noted of the cervical spine.   Electronically Signed   By: Marcello Moores  Register   On: 11/10/2014 09:00       Assessment & Plan:

## 2014-11-20 ENCOUNTER — Ambulatory Visit: Payer: BC Managed Care – PPO | Admitting: Internal Medicine

## 2014-12-10 ENCOUNTER — Other Ambulatory Visit (INDEPENDENT_AMBULATORY_CARE_PROVIDER_SITE_OTHER): Payer: BC Managed Care – PPO

## 2014-12-10 ENCOUNTER — Encounter: Payer: Self-pay | Admitting: Internal Medicine

## 2014-12-10 ENCOUNTER — Ambulatory Visit (INDEPENDENT_AMBULATORY_CARE_PROVIDER_SITE_OTHER): Payer: BC Managed Care – PPO | Admitting: Internal Medicine

## 2014-12-10 VITALS — BP 114/70 | HR 67 | Temp 97.8°F | Resp 16 | Ht 73.0 in | Wt 253.0 lb

## 2014-12-10 DIAGNOSIS — B353 Tinea pedis: Secondary | ICD-10-CM | POA: Insufficient documentation

## 2014-12-10 DIAGNOSIS — L301 Dyshidrosis [pompholyx]: Secondary | ICD-10-CM | POA: Diagnosis not present

## 2014-12-10 DIAGNOSIS — E139 Other specified diabetes mellitus without complications: Secondary | ICD-10-CM

## 2014-12-10 DIAGNOSIS — E785 Hyperlipidemia, unspecified: Secondary | ICD-10-CM | POA: Diagnosis not present

## 2014-12-10 LAB — LIPID PANEL
Cholesterol: 98 mg/dL (ref 0–200)
HDL: 35.4 mg/dL — ABNORMAL LOW (ref >39.00)
LDL Cholesterol: 48 mg/dL (ref 0–99)
NonHDL: 62.6
Total CHOL/HDL Ratio: 3
Triglycerides: 71 mg/dL (ref 0.0–149.0)
VLDL: 14.2 mg/dL (ref 0.0–40.0)

## 2014-12-10 LAB — COMPREHENSIVE METABOLIC PANEL
ALT: 20 U/L (ref 0–53)
AST: 17 U/L (ref 0–37)
Albumin: 4.1 g/dL (ref 3.5–5.2)
Alkaline Phosphatase: 89 U/L (ref 39–117)
BUN: 12 mg/dL (ref 6–23)
CO2: 27 mEq/L (ref 19–32)
Calcium: 8.9 mg/dL (ref 8.4–10.5)
Chloride: 105 mEq/L (ref 96–112)
Creatinine, Ser: 1.01 mg/dL (ref 0.40–1.50)
GFR: 96.18 mL/min (ref >60.00)
Glucose, Bld: 115 mg/dL — ABNORMAL HIGH (ref 70–99)
Potassium: 4.5 mEq/L (ref 3.5–5.1)
Sodium: 137 mEq/L (ref 135–145)
Total Bilirubin: 0.5 mg/dL (ref 0.2–1.2)
Total Protein: 7 g/dL (ref 6.0–8.3)

## 2014-12-10 LAB — TSH: TSH: 0.87 u[IU]/mL (ref 0.35–4.50)

## 2014-12-10 MED ORDER — CICLOPIROX 0.77 % EX GEL: 1.0000 | Freq: Two times a day (BID) | CUTANEOUS | Status: DC

## 2014-12-10 MED ORDER — TRIAMCINOLONE ACETONIDE 0.5 % EX CREA: 1.0000 "application " | TOPICAL_CREAM | Freq: Three times a day (TID) | CUTANEOUS | Status: DC

## 2014-12-10 NOTE — Patient Instructions (Signed)

## 2014-12-10 NOTE — Progress Notes (Signed)
Subjective:  [name]., male    DOB: 1951-02-17  Age: 64 y.o. MRN: [id number]  CC: Rash; Hyperlipidemia; and Hypertension   HPI Anthony Sem. presents for rash evaluation - he has has two different rashes on his feet and around his ankles for the last month -one is between the toes in the web spaces and it itches, the other is behind and around the ankles onto the heels, this one is red and scaly. He has not treated these rashes in any way.  Outpatient Prescriptions Prior to Visit  Medication Sig Dispense Refill  . aspirin EC 81 MG tablet Take 81 mg by mouth daily.    . cholecalciferol (VITAMIN D) 1000 UNITS tablet Take 1,000 Units by mouth daily.    . Dapagliflozin Propanediol (FARXIGA) 10 MG TABS TAKE 1 TABLET BY MOUTH DAILY. 90 tablet 3  . Insulin Glargine (LANTUS SOLOSTAR [place]) Inject 40 Units into the skin 2 (two) times daily.     . insulin lispro (HUMALOG) 100 UNIT/ML injection Inject 2-20 Units into the skin 3 (three) times daily before meals. Per sliding scale    . Meloxicam (VIVLODEX) 10 MG CAPS Take 1 capsule by mouth daily. 30 capsule 11  . Pitavastatin Calcium (LIVALO) 2 MG TABS Take by mouth daily.    . Potassium Citrate 15 MEQ (1620 MG) TBCR Take 1 tablet by mouth 2 (two) times daily.    . ramipril (ALTACE) 10 MG tablet Take 10 mg by mouth daily before breakfast.     . tadalafil (CIALIS) 20 MG tablet Take 20 mg by mouth daily as needed for erectile dysfunction.    . Tamsulosin HCl (FLOMAX) 0.4 MG CAPS Take 0.4 mg by mouth daily.     . vitamin B-12 (CYANOCOBALAMIN) 500 MCG tablet Take 500 mcg by mouth daily.    Marland Kitchen ZETIA 10 MG tablet TAKE 1 TABLET (10 MG TOTAL) BY MOUTH DAILY. 90 tablet 3   No facility-administered medications prior to visit.    ROS Review of Systems  Constitutional: Negative.  Negative for fever, chills, diaphoresis, appetite change and fatigue.  HENT: Negative.   Eyes: Negative.   Respiratory: Negative.  Negative for cough,  choking, chest tightness, shortness of breath and stridor.   Cardiovascular: Negative.  Negative for chest pain, palpitations and leg swelling.  Gastrointestinal: Negative.  Negative for nausea, abdominal pain, diarrhea and blood in stool.  Endocrine: Negative.   Genitourinary: Negative.  Negative for difficulty urinating.  Musculoskeletal: Negative.  Negative for myalgias, back pain, joint swelling and arthralgias.  Skin: Positive for rash.  Allergic/Immunologic: Negative.   Neurological: Negative.  Negative for dizziness, syncope, speech difficulty, light-headedness, numbness and headaches.  Hematological: Negative.  Negative for adenopathy. Does not bruise/bleed easily.  Psychiatric/Behavioral: Negative.     Objective:  BP 114/70 mmHg  Pulse 67  Temp(Src) 97.8 F (36.6 C) (Oral)  Resp 16  Ht 6\' 1"  (1.854 m)  Wt 253 lb (114.76 kg)  BMI 33.39 kg/m2  SpO2 95%  BP Readings from Last 3 Encounters:  12/10/14 114/70  11/10/14 134/78  07/22/14 128/80    Wt Readings from Last 3 Encounters:  12/10/14 253 lb (114.76 kg)  11/10/14 252 lb (114.306 kg)  07/22/14 241 lb (109.317 kg)    Physical Exam  Constitutional: He is oriented to person, place, and time. He appears well-developed and well-nourished. No distress.  HENT:  Head: Normocephalic and atraumatic.  Mouth/Throat: Oropharynx is clear and moist. No oropharyngeal  exudate.  Eyes: Conjunctivae are normal. Right eye exhibits no discharge. Left eye exhibits no discharge. No scleral icterus.  Neck: Normal range of motion. Neck supple. No JVD present. No tracheal deviation present. No thyromegaly present.  Cardiovascular: Normal rate, regular rhythm, normal heart sounds and intact distal pulses.  Exam reveals no gallop and no friction rub.   No murmur heard. Pulmonary/Chest: Effort normal and breath sounds normal. No stridor. No respiratory distress. He has no wheezes. He has no rales. He exhibits no tenderness.  Abdominal: Soft.  Bowel sounds are normal. He exhibits no distension and no mass. There is no tenderness. There is no rebound and no guarding.  Musculoskeletal: Normal range of motion. He exhibits no edema or tenderness.  Lymphadenopathy:    He has no cervical adenopathy.  Neurological: He is oriented to person, place, and time.  Skin: Skin is warm and dry. Rash noted. No purpura noted. Rash is macular. Rash is not papular, not maculopapular, not nodular, not pustular, not vesicular and not urticarial. He is not diaphoretic. No erythema. No pallor.  In the web spaces of the toes there is moist maceration with scaly/pale skin. Around the ankles and heels bilaterally there are macules that are consistently erythematous with faint scale around the edges, these are non-blanching and there is no tenderness, streaking, pustules, or vesicles.  Psychiatric: He has a normal mood and affect. His behavior is normal. Judgment and thought content normal.  Vitals reviewed.   Lab Results  Component Value Date   WBC 10.2 10/31/2012   HGB 14.8 10/31/2012   HCT 41.7 10/31/2012   PLT 166 10/31/2012   GLUCOSE 115* 12/10/2014   CHOL 98 12/10/2014   TRIG 71.0 12/10/2014   HDL 35.40* 12/10/2014   LDLCALC 48 12/10/2014   ALT 20 12/10/2014   AST 17 12/10/2014   NA 137 12/10/2014   K 4.5 12/10/2014   CL 105 12/10/2014   CREATININE 1.01 12/10/2014   BUN 12 12/10/2014   CO2 27 12/10/2014   TSH 0.87 12/10/2014   PSA 1.24 06/26/2012   HGBA1C 8.1* 07/23/2014    Dg Cervical Spine Complete  11/10/2014   CLINICAL DATA:  Bilateral neck pain for 7 days.  No known injury.  EXAM: CERVICAL SPINE  4+ VIEWS  COMPARISON:  None.  FINDINGS: Diffuse multilevel degenerative change noted. Mild cervical spine straightening, most likely degenerative. Neural foramina patent. No acute bony abnormality. Pulmonary apices are clear.  IMPRESSION: Diffuse degenerative changes noted of the cervical spine.   Electronically Signed   By: Marcello Moores  Register    On: 11/10/2014 09:00    Assessment & Plan:   Anthony Daniel was seen today for rash, hyperlipidemia and hypertension.  Diagnoses and all orders for this visit:  LADA (latent autoimmune diabetes in adults), managed as type 1 Orders: -     Comprehensive metabolic panel; Future  Hyperlipidemia with target LDL less than 100 - he has achieved his LDL goal and is doing well on the statin Orders: -     Lipid panel; Future -     Comprehensive metabolic panel; Future -     TSH; Future  Tinea pedis, recurrent Orders: -     Ciclopirox 0.77 % gel; Apply 1 Act topically 2 (two) times daily.  Dyshidrotic foot dermatitis Orders: -     triamcinolone cream (KENALOG) 0.5 %; Apply 1 application topically 3 (three) times daily.   I am having Anthony Daniel start on triamcinolone cream and Ciclopirox. I am also having  him maintain his ramipril, tamsulosin, Insulin Glargine (LANTUS SOLOSTAR [place]), Potassium Citrate, tadalafil, cholecalciferol, vitamin B-12, aspirin EC, insulin lispro, dapagliflozin propanediol, Pitavastatin Calcium, ZETIA, Meloxicam, and B-D INS SYR ULTRAFINE 1CC/30G.  Meds ordered this encounter  Medications  . B-D INS SYR ULTRAFINE 1CC/30G 30G X 1/2" 1 ML MISC    Sig: See admin instructions.    Refill:  2  . triamcinolone cream (KENALOG) 0.5 %    Sig: Apply 1 application topically 3 (three) times daily.    Dispense:  30 g    Refill:  3  . Ciclopirox 0.77 % gel    Sig: Apply 1 Act topically 2 (two) times daily.    Dispense:  45 g    Refill:  3     Follow-up: Return in about 4 months (around 04/12/2015).  Scarlette Calico, MD

## 2015-01-09 ENCOUNTER — Telehealth: Payer: Self-pay

## 2015-01-09 NOTE — Telephone Encounter (Signed)
Pt will come in for appt for HgA1C

## 2015-03-04 LAB — HM DIABETES EYE EXAM

## 2015-04-09 ENCOUNTER — Other Ambulatory Visit: Payer: Self-pay

## 2015-04-09 DIAGNOSIS — L301 Dyshidrosis [pompholyx]: Secondary | ICD-10-CM

## 2015-04-09 DIAGNOSIS — B353 Tinea pedis: Secondary | ICD-10-CM

## 2015-04-09 MED ORDER — CICLOPIROX 0.77 % EX GEL
1.0000 | Freq: Two times a day (BID) | CUTANEOUS | Status: DC
Start: 2015-04-09 — End: 2015-10-01

## 2015-04-09 MED ORDER — TRIAMCINOLONE ACETONIDE 0.5 % EX CREA: 1.0000 "application " | TOPICAL_CREAM | Freq: Three times a day (TID) | CUTANEOUS | Status: DC

## 2015-04-22 ENCOUNTER — Emergency Department (HOSPITAL_COMMUNITY)
Admission: EM | Admit: 2015-04-22 | Discharge: 2015-04-22 | Disposition: A | Discharge status: Home / Self Care | Payer: BC Managed Care – PPO | Attending: Emergency Medicine | Admitting: Emergency Medicine

## 2015-04-22 ENCOUNTER — Encounter (HOSPITAL_COMMUNITY): Payer: Self-pay

## 2015-04-22 ENCOUNTER — Emergency Department (HOSPITAL_COMMUNITY): Payer: BC Managed Care – PPO

## 2015-04-22 DIAGNOSIS — Z79899 Other long term (current) drug therapy: Secondary | ICD-10-CM | POA: Diagnosis not present

## 2015-04-22 DIAGNOSIS — Z720 Tobacco use: Secondary | ICD-10-CM | POA: Insufficient documentation

## 2015-04-22 DIAGNOSIS — Z9049 Acquired absence of other specified parts of digestive tract: Secondary | ICD-10-CM | POA: Insufficient documentation

## 2015-04-22 DIAGNOSIS — Z791 Long term (current) use of non-steroidal anti-inflammatories (NSAID): Secondary | ICD-10-CM | POA: Insufficient documentation

## 2015-04-22 DIAGNOSIS — Z7952 Long term (current) use of systemic steroids: Secondary | ICD-10-CM | POA: Insufficient documentation

## 2015-04-22 DIAGNOSIS — I10 Essential (primary) hypertension: Secondary | ICD-10-CM | POA: Insufficient documentation

## 2015-04-22 DIAGNOSIS — Z79891 Long term (current) use of opiate analgesic: Secondary | ICD-10-CM | POA: Insufficient documentation

## 2015-04-22 DIAGNOSIS — R109 Unspecified abdominal pain: Secondary | ICD-10-CM | POA: Diagnosis present

## 2015-04-22 DIAGNOSIS — E119 Type 2 diabetes mellitus without complications: Secondary | ICD-10-CM | POA: Diagnosis not present

## 2015-04-22 DIAGNOSIS — Z794 Long term (current) use of insulin: Secondary | ICD-10-CM | POA: Insufficient documentation

## 2015-04-22 DIAGNOSIS — Z7982 Long term (current) use of aspirin: Secondary | ICD-10-CM | POA: Insufficient documentation

## 2015-04-22 DIAGNOSIS — Z8551 Personal history of malignant neoplasm of bladder: Secondary | ICD-10-CM | POA: Diagnosis not present

## 2015-04-22 DIAGNOSIS — E785 Hyperlipidemia, unspecified: Secondary | ICD-10-CM | POA: Insufficient documentation

## 2015-04-22 DIAGNOSIS — N200 Calculus of kidney: Secondary | ICD-10-CM | POA: Diagnosis not present

## 2015-04-22 DIAGNOSIS — E860 Dehydration: Secondary | ICD-10-CM | POA: Insufficient documentation

## 2015-04-22 LAB — URINE MICROSCOPIC-ADD ON

## 2015-04-22 LAB — URINALYSIS, ROUTINE W REFLEX MICROSCOPIC
Bilirubin Urine: NEGATIVE
Glucose, UA: >1000 mg/dL — AB
Hgb urine dipstick: NEGATIVE
Ketones, ur: 15 mg/dL — AB
Leukocytes, UA: NEGATIVE
Nitrite: NEGATIVE
Protein, ur: NEGATIVE mg/dL
Specific Gravity, Urine: 1.028 (ref 1.005–1.030)
Urobilinogen, UA: 1 mg/dL (ref 0.0–1.0)
pH: 6.5 (ref 5.0–8.0)

## 2015-04-22 MED ORDER — SODIUM CHLORIDE 0.9 % IV BOLUS (SEPSIS)
1000.0000 mL | Freq: Once | INTRAVENOUS | Status: AC
  Administered 2015-04-22: 1000 mL via INTRAVENOUS

## 2015-04-22 MED ORDER — FENTANYL CITRATE (PF) 100 MCG/2ML IJ SOLN
50.0000 ug | Freq: Once | INTRAMUSCULAR | Status: AC
  Administered 2015-04-22: 50 ug via INTRAVENOUS
  Filled 2015-04-22: qty 2

## 2015-04-22 MED ORDER — HYDROCODONE-ACETAMINOPHEN 5-325 MG PO TABS
1.0000 | ORAL_TABLET | Freq: Once | ORAL | Status: AC
  Administered 2015-04-22: 1 via ORAL
  Filled 2015-04-22: qty 1

## 2015-04-22 MED ORDER — ONDANSETRON 4 MG PO TBDP: 4.0000 mg | ORAL_TABLET | Freq: Three times a day (TID) | ORAL | Status: DC | PRN

## 2015-04-22 MED ORDER — HYDROCODONE-ACETAMINOPHEN 5-325 MG PO TABS: 1.0000 | ORAL_TABLET | Freq: Four times a day (QID) | ORAL | Status: DC | PRN

## 2015-04-22 MED ORDER — TAMSULOSIN HCL 0.4 MG PO CAPS: 0.4000 mg | ORAL_CAPSULE | Freq: Every day | ORAL | Status: DC

## 2015-04-22 MED ORDER — ONDANSETRON HCL 4 MG/2ML IJ SOLN
4.0000 mg | Freq: Once | INTRAMUSCULAR | Status: AC
  Administered 2015-04-22: 4 mg via INTRAVENOUS
  Filled 2015-04-22: qty 2

## 2015-04-22 MED ORDER — KETOROLAC TROMETHAMINE 30 MG/ML IJ SOLN
30.0000 mg | Freq: Once | INTRAMUSCULAR | Status: AC
  Administered 2015-04-22: 30 mg via INTRAVENOUS
  Filled 2015-04-22: qty 1

## 2015-04-22 NOTE — ED Notes (Signed)
[place] at bedside. Pt states his pain is significantly improving.

## 2015-04-22 NOTE — ED Provider Notes (Signed)
CSN: [id number]     Arrival date & time 04/22/15  0712 History   First MD Initiated Contact with Patient 04/22/15 0720     Chief Complaint  Patient presents with  . Flank Pain  . Emesis     (Consider location/radiation/quality/duration/timing/severity/associated sxs/prior Treatment) HPI Patient presents with ongoing right lateral abdominal pain. Pain began relatively suddenly about 6 hours ago. Since onset the pain is radiated slightly from his right flank towards his right inguinal crease. There is a deep throbbing soreness, that is severe, minimally improved with home narcotic use. There is associated fever, chills, nausea, vomiting. No urinary changes. Patient was well prior to the onset of symptoms.  Past Medical History  Diagnosis Date  . Diabetes mellitus without complication ([place])   . Hyperlipidemia   . Hypertension   . Cancer [facility]) 1996    bladder cancer  . Stones in the urinary tract    Past Surgical History  Procedure Laterality Date  . Tonsillectomy  1975  . Bladder surgery    . Appendectomy  11/07/2012    Dr Ninfa Linden  . Laparoscopic appendectomy N/A 11/07/2012    Procedure: APPENDECTOMY LAPAROSCOPIC;  Surgeon: Harl Bowie, MD;  Location: [facility] OR;  Service: General;  Laterality: N/A;   Family History  Problem Relation Age of Onset  . Cancer Other     Colon and Prostate Cancer  . Hypertension Other   . Diabetes Other   . Alcohol abuse Neg Hx   . Drug abuse Neg Hx   . Early death Neg Hx   . Heart disease Neg Hx   . Hyperlipidemia Neg Hx   . Kidney disease Neg Hx   . Stroke Neg Hx    Social History  Substance Use Topics  . Smoking status: Current Every Day Smoker -- 1.00 packs/day for 30 years    Types: Cigarettes  . Smokeless tobacco: Never Used  . Alcohol Use: 3.0 oz/week    5 Shots of liquor per week     Comment: occasional    Review of Systems  Constitutional:       Per HPI, otherwise negative  HENT:       Per HPI, otherwise negative   Respiratory:       Per HPI, otherwise negative  Cardiovascular:       Per HPI, otherwise negative  Gastrointestinal: Positive for vomiting.  Endocrine:       Negative aside from HPI  Genitourinary:       Neg aside from HPI   Musculoskeletal:       Per HPI, otherwise negative  Skin: Negative.   Neurological: Negative for syncope.      Allergies  Crestor; Lipitor; and Contrast media  Home Medications   Prior to Admission medications   Medication Sig Start Date End Date Taking? Authorizing Provider  aspirin EC 81 MG tablet Take 81 mg by mouth daily.   Yes Historical Provider, MD  B-D INS SYR ULTRAFINE 1CC/30G 30G X 1/2" 1 ML MISC See admin instructions. 11/13/14  Yes Historical Provider, MD  cholecalciferol (VITAMIN D) 1000 UNITS tablet Take 1,000 Units by mouth daily.   Yes Historical Provider, MD  Ciclopirox 0.77 % gel Apply 1 Act topically 2 (two) times daily. Patient taking differently: Apply 1 Act topically 2 (two) times daily. Uses on ankle and feet 04/09/15  Yes Janith Lima, MD  Dapagliflozin Propanediol (FARXIGA) 10 MG TABS TAKE 1 TABLET BY MOUTH DAILY. 04/21/14  Yes Janith Lima, MD  Insulin Glargine (LANTUS SOLOSTAR [place]) Inject 40 Units into the skin 2 (two) times daily. Only uses when pump goes out   Yes Historical Provider, MD  Insulin Human (INSULIN PUMP) SOLN Inject into the skin continuous. *Novolog*   Yes Historical Provider, MD  Meloxicam (VIVLODEX) 10 MG CAPS Take 1 capsule by mouth daily. 11/10/14  Yes Janith Lima, MD  oxymetazoline (AFRIN) 0.05 % nasal spray Place 1 spray into both nostrils 2 (two) times daily as needed for congestion.   Yes Historical Provider, MD  Pitavastatin Calcium (LIVALO) 2 MG TABS Take 2 mg by mouth daily.    Yes Historical Provider, MD  Potassium Citrate 15 MEQ (1620 MG) TBCR Take 1 tablet by mouth 2 (two) times daily.   Yes Historical Provider, MD  ramipril (ALTACE) 10 MG tablet Take 10 mg by mouth daily before breakfast.    Yes  Historical Provider, MD  tadalafil (CIALIS) 20 MG tablet Take 20 mg by mouth daily as needed for erectile dysfunction.   Yes Historical Provider, MD  triamcinolone cream (KENALOG) 0.5 % Apply 1 application topically 3 (three) times daily. 04/09/15  Yes Janith Lima, MD  vitamin B-12 (CYANOCOBALAMIN) 500 MCG tablet Take 500 mcg by mouth daily.   Yes Historical Provider, MD  ZETIA 10 MG tablet TAKE 1 TABLET (10 MG TOTAL) BY MOUTH DAILY. 09/07/14  Yes Janith Lima, MD  HYDROcodone-acetaminophen (NORCO/VICODIN) 5-325 MG tablet Take 1 tablet by mouth every 6 (six) hours as needed for severe pain. 04/22/15   Carmin Muskrat, MD  ondansetron (ZOFRAN ODT) 4 MG disintegrating tablet Take 1 tablet (4 mg total) by mouth every 8 (eight) hours as needed for nausea or vomiting. 04/22/15   Carmin Muskrat, MD  tamsulosin (FLOMAX) 0.4 MG CAPS capsule Take 1 capsule (0.4 mg total) by mouth daily. 04/22/15   Carmin Muskrat, MD   BP 131/56 mmHg  Pulse 70  Temp(Src) 98.1 F (36.7 C) (Oral)  Resp 20  SpO2 99% Physical Exam  Constitutional: He is oriented to person, place, and time. He appears well-developed. No distress.  HENT:  Head: Normocephalic and atraumatic.  Eyes: Conjunctivae and EOM are normal.  Cardiovascular: Normal rate and regular rhythm.   Pulmonary/Chest: Effort normal. No stridor. No respiratory distress.  Abdominal: He exhibits no distension. There is no tenderness.  Musculoskeletal: He exhibits no edema.  Neurological: He is alert and oriented to person, place, and time.  Skin: Skin is warm and dry.  Psychiatric: He has a normal mood and affect.  Nursing note and vitals reviewed.   ED Course  Procedures (including critical care time) Labs Review Labs Reviewed  URINALYSIS, ROUTINE W REFLEX MICROSCOPIC (NOT AT [facility]) - Abnormal; Notable for the following:    Glucose, UA >1000 (*)    Ketones, ur 15 (*)    All other components within normal limits  URINE MICROSCOPIC-ADD ON    Imaging  Review US Renal  04/22/2015   CLINICAL DATA:  64 year old with acute onset of right flank pain and right lower quadrant abdominal pain earlier today, associated with nausea. Current history of urinary tract calculi.  EXAM: RENAL / URINARY TRACT ULTRASOUND COMPLETE  COMPARISON:  Abdominal x-rays 11/12/2014 and earlier. CT abdomen and pelvis 2/8/1,014.  FINDINGS: Right Kidney:  Length: Approximately 12.5 cm. Numerous shadowing calculi, the largest in an upper pole calix measuring approximately 8 mm. Likely calyceal diverticulum involving the upper pole. Well-preserved cortex. Normal parenchymal echotexture. No focal parenchymal abnormality. No hydronephrosis.  Left Kidney:  Length: Approximately 12.5 cm. Numerous shadowing calculi, the largest in a lower pole calix measuring approximately 9 mm. Approximate 1.3 cm cyst arising from the upper pole. No significant focal parenchymal abnormality. Well-preserved cortex. Normal parenchymal echotexture. No hydronephrosis.  Bladder:  Normal in appearance for degree of distention.  Other:  Borderline to mild prostate gland enlargement, the gland measuring approximately 4.5 x 4.8 x 3.6 cm, containing calcifications.  IMPRESSION: 1. Numerous bilateral renal calculi. 2. No evidence of hydronephrosis involving either kidney to suggest urinary tract obstruction. 3. Likely calyceal diverticulum involving the upper pole the right kidney. Benign cyst involving the upper pole of the left kidney. 4. Borderline to mild prostate gland enlargement, measured above.   Electronically Signed   By: Evangeline Dakin M.D.   On: 04/22/2015 09:32   I have personally reviewed and evaluated these images and lab results as part of my medical decision-making.  Pain substantially better. I discussed the findings with patient and his wife. He will follow-up with urology.  MDM   Final diagnoses:  Flank pain  Nephrolithiasis  Dehydration   Patient history nephrolithiasis presents with new  right inguinal pain, right flank pain. Here the patient has no evidence for obstruction, infection, but does have evidence for kidney stones. Pain resolved here, and the patient was discharged in stable condition to follow-up with urology.  Carmin Muskrat, MD 04/22/15 [phone]

## 2015-04-22 NOTE — ED Notes (Signed)
Pt c/o R flank pain radiating into RLQ and n/v starting around 0100.  Pain score 9/10.  Pt reports taking oxycodone around 0200.  Hx of kidney stones and is followed by [facility].

## 2015-04-22 NOTE — Discharge Instructions (Signed)
As discussed, it is important that you follow up as soon as possible with your physician for continued management of your condition. ° °If you develop any new, or concerning changes in your condition, please return to the emergency department immediately. ° °

## 2015-04-23 ENCOUNTER — Observation Stay (HOSPITAL_COMMUNITY)
Admission: EM | Admit: 2015-04-23 | Discharge: 2015-04-24 | Disposition: A | Discharge status: Home / Self Care | Payer: BC Managed Care – PPO | Attending: Urology | Admitting: Urology

## 2015-04-23 ENCOUNTER — Observation Stay (HOSPITAL_COMMUNITY): Payer: BC Managed Care – PPO | Admitting: Anesthesiology

## 2015-04-23 ENCOUNTER — Emergency Department (HOSPITAL_COMMUNITY): Payer: BC Managed Care – PPO

## 2015-04-23 ENCOUNTER — Ambulatory Visit (HOSPITAL_COMMUNITY): Admission: RE | Admit: 2015-04-23 | Payer: BC Managed Care – PPO | Source: Ambulatory Visit | Admitting: Urology

## 2015-04-23 ENCOUNTER — Encounter (HOSPITAL_COMMUNITY)
Admission: EM | Disposition: A | Discharge status: Home / Self Care | Payer: Self-pay | Source: Home / Self Care | Attending: Emergency Medicine

## 2015-04-23 ENCOUNTER — Encounter (HOSPITAL_COMMUNITY): Payer: Self-pay | Admitting: Emergency Medicine

## 2015-04-23 ENCOUNTER — Other Ambulatory Visit: Payer: Self-pay | Admitting: Urology

## 2015-04-23 DIAGNOSIS — Z79899 Other long term (current) drug therapy: Secondary | ICD-10-CM | POA: Diagnosis not present

## 2015-04-23 DIAGNOSIS — Z79891 Long term (current) use of opiate analgesic: Secondary | ICD-10-CM | POA: Insufficient documentation

## 2015-04-23 DIAGNOSIS — Z7984 Long term (current) use of oral hypoglycemic drugs: Secondary | ICD-10-CM | POA: Insufficient documentation

## 2015-04-23 DIAGNOSIS — Z87442 Personal history of urinary calculi: Secondary | ICD-10-CM | POA: Diagnosis not present

## 2015-04-23 DIAGNOSIS — E785 Hyperlipidemia, unspecified: Secondary | ICD-10-CM | POA: Diagnosis not present

## 2015-04-23 DIAGNOSIS — Z794 Long term (current) use of insulin: Secondary | ICD-10-CM | POA: Insufficient documentation

## 2015-04-23 DIAGNOSIS — Z8551 Personal history of malignant neoplasm of bladder: Secondary | ICD-10-CM | POA: Diagnosis not present

## 2015-04-23 DIAGNOSIS — Z7982 Long term (current) use of aspirin: Secondary | ICD-10-CM | POA: Diagnosis not present

## 2015-04-23 DIAGNOSIS — F17200 Nicotine dependence, unspecified, uncomplicated: Secondary | ICD-10-CM | POA: Diagnosis not present

## 2015-04-23 DIAGNOSIS — N201 Calculus of ureter: Principal | ICD-10-CM | POA: Diagnosis present

## 2015-04-23 DIAGNOSIS — F17210 Nicotine dependence, cigarettes, uncomplicated: Secondary | ICD-10-CM | POA: Diagnosis not present

## 2015-04-23 DIAGNOSIS — E119 Type 2 diabetes mellitus without complications: Secondary | ICD-10-CM | POA: Insufficient documentation

## 2015-04-23 DIAGNOSIS — N200 Calculus of kidney: Secondary | ICD-10-CM | POA: Diagnosis present

## 2015-04-23 DIAGNOSIS — I10 Essential (primary) hypertension: Secondary | ICD-10-CM | POA: Diagnosis not present

## 2015-04-23 HISTORY — PX: CYSTOSCOPY WITH URETEROSCOPY AND STENT PLACEMENT: SHX6377

## 2015-04-23 LAB — GLUCOSE, CAPILLARY
Glucose-Capillary: 85 mg/dL (ref 65–99)
Glucose-Capillary: 109 mg/dL — ABNORMAL HIGH (ref 65–99)

## 2015-04-23 LAB — CBC WITH DIFFERENTIAL/PLATELET
Basophils Absolute: 0 10*3/uL (ref 0.0–0.1)
Basophils Relative: 0 %
Eosinophils Absolute: 0.1 10*3/uL (ref 0.0–0.7)
Eosinophils Relative: 1 %
HCT: 47.5 % (ref 39.0–52.0)
Hemoglobin: 15.8 g/dL (ref 13.0–17.0)
Lymphocytes Relative: 17 %
Lymphs Abs: 3.1 10*3/uL (ref 0.7–4.0)
MCH: 29.9 pg (ref 26.0–34.0)
MCHC: 33.3 g/dL (ref 30.0–36.0)
MCV: 89.8 fL (ref 78.0–100.0)
Monocytes Absolute: 1.2 10*3/uL — ABNORMAL HIGH (ref 0.1–1.0)
Monocytes Relative: 6 %
Neutro Abs: 14.3 10*3/uL — ABNORMAL HIGH (ref 1.7–7.7)
Neutrophils Relative %: 76 %
Platelets: 163 10*3/uL (ref 150–400)
RBC: 5.29 MIL/uL (ref 4.22–5.81)
RDW: 15.5 % (ref 11.5–15.5)
WBC: 18.7 10*3/uL — ABNORMAL HIGH (ref 4.0–10.5)

## 2015-04-23 LAB — COMPREHENSIVE METABOLIC PANEL
ALT: 22 U/L (ref 17–63)
AST: 23 U/L (ref 15–41)
Albumin: 4 g/dL (ref 3.5–5.0)
Alkaline Phosphatase: 90 U/L (ref 38–126)
Anion gap: 7 (ref 5–15)
BUN: 24 mg/dL — ABNORMAL HIGH (ref 6–20)
CO2: 25 mmol/L (ref 22–32)
Calcium: 8.5 mg/dL — ABNORMAL LOW (ref 8.9–10.3)
Chloride: 103 mmol/L (ref 101–111)
Creatinine, Ser: 1.98 mg/dL — ABNORMAL HIGH (ref 0.61–1.24)
GFR calc Af Amer: 40 mL/min — ABNORMAL LOW (ref >60)
GFR calc non Af Amer: 34 mL/min — ABNORMAL LOW (ref >60)
Glucose, Bld: 167 mg/dL — ABNORMAL HIGH (ref 65–99)
Potassium: 4.5 mmol/L (ref 3.5–5.1)
Sodium: 135 mmol/L (ref 135–145)
Total Bilirubin: 0.7 mg/dL (ref 0.3–1.2)
Total Protein: 7.1 g/dL (ref 6.5–8.1)

## 2015-04-23 LAB — URINALYSIS, ROUTINE W REFLEX MICROSCOPIC
Bilirubin Urine: NEGATIVE
Glucose, UA: >1000 mg/dL — AB
Hgb urine dipstick: NEGATIVE
Ketones, ur: 15 mg/dL — AB
Leukocytes, UA: NEGATIVE
Nitrite: NEGATIVE
Protein, ur: NEGATIVE mg/dL
Specific Gravity, Urine: 1.035 — ABNORMAL HIGH (ref 1.005–1.030)
Urobilinogen, UA: 0.2 mg/dL (ref 0.0–1.0)
pH: 5 (ref 5.0–8.0)

## 2015-04-23 LAB — SURGICAL PCR SCREEN
MRSA, PCR: NEGATIVE
Staphylococcus aureus: NEGATIVE

## 2015-04-23 LAB — URINE MICROSCOPIC-ADD ON

## 2015-04-23 SURGERY — CYSTOURETEROSCOPY, WITH STENT INSERTION
Anesthesia: General | Laterality: Right

## 2015-04-23 MED ORDER — HYDROMORPHONE HCL 1 MG/ML IJ SOLN
0.5000 mg | INTRAMUSCULAR | Status: DC | PRN
  Administered 2015-04-23 – 2015-04-24 (×3): 1 mg via INTRAVENOUS
  Filled 2015-04-23 (×4): qty 1

## 2015-04-23 MED ORDER — MIDAZOLAM HCL 5 MG/5ML IJ SOLN
INTRAMUSCULAR | Status: DC | PRN
  Administered 2015-04-23: 2 mg via INTRAVENOUS

## 2015-04-23 MED ORDER — SODIUM CHLORIDE 0.9 % IR SOLN
Status: DC | PRN
  Administered 2015-04-23: 1

## 2015-04-23 MED ORDER — FENTANYL CITRATE (PF) 100 MCG/2ML IJ SOLN: 25.0000 ug | INTRAMUSCULAR | Status: DC | PRN

## 2015-04-23 MED ORDER — LIDOCAINE HCL (CARDIAC) 20 MG/ML IV SOLN
INTRAVENOUS | Status: DC | PRN
  Administered 2015-04-23: 50 mg via INTRAVENOUS

## 2015-04-23 MED ORDER — PROPOFOL 10 MG/ML IV BOLUS
INTRAVENOUS | Status: DC | PRN
  Administered 2015-04-23: 170 mg via INTRAVENOUS

## 2015-04-23 MED ORDER — PRAVASTATIN SODIUM 40 MG PO TABS
40.0000 mg | ORAL_TABLET | Freq: Every day | ORAL | Status: DC
  Filled 2015-04-23: qty 1

## 2015-04-23 MED ORDER — MEPERIDINE HCL 50 MG/ML IJ SOLN: 6.2500 mg | INTRAMUSCULAR | Status: DC | PRN

## 2015-04-23 MED ORDER — DEXTROSE 5 % IV SOLN
2.0000 g | INTRAVENOUS | Status: AC
Start: 2015-04-23 — End: 2015-04-23
  Administered 2015-04-23: 2 g via INTRAVENOUS

## 2015-04-23 MED ORDER — TAMSULOSIN HCL 0.4 MG PO CAPS
0.4000 mg | ORAL_CAPSULE | Freq: Every day | ORAL | Status: DC
  Administered 2015-04-23 – 2015-04-24 (×2): 0.4 mg via ORAL
  Filled 2015-04-23 (×2): qty 1

## 2015-04-23 MED ORDER — NICOTINE 21 MG/24HR TD PT24
21.0000 mg | MEDICATED_PATCH | Freq: Once | TRANSDERMAL | Status: AC
  Administered 2015-04-23: 21 mg via TRANSDERMAL
  Filled 2015-04-23: qty 1

## 2015-04-23 MED ORDER — SODIUM CHLORIDE 0.9 % IV SOLN
INTRAVENOUS | Status: DC
  Administered 2015-04-23 – 2015-04-24 (×3): via INTRAVENOUS

## 2015-04-23 MED ORDER — PROMETHAZINE HCL 25 MG/ML IJ SOLN: 6.2500 mg | INTRAMUSCULAR | Status: DC | PRN

## 2015-04-23 MED ORDER — ONDANSETRON HCL 4 MG/2ML IJ SOLN
4.0000 mg | INTRAMUSCULAR | Status: DC | PRN
  Administered 2015-04-23 – 2015-04-24 (×4): 4 mg via INTRAVENOUS
  Filled 2015-04-23 (×3): qty 2

## 2015-04-23 MED ORDER — KETOROLAC TROMETHAMINE 30 MG/ML IJ SOLN
30.0000 mg | Freq: Once | INTRAMUSCULAR | Status: AC
  Administered 2015-04-23: 30 mg via INTRAVENOUS
  Filled 2015-04-23: qty 1

## 2015-04-23 MED ORDER — EZETIMIBE 10 MG PO TABS
10.0000 mg | ORAL_TABLET | Freq: Every day | ORAL | Status: DC
  Administered 2015-04-23 – 2015-04-24 (×2): 10 mg via ORAL
  Filled 2015-04-23 (×2): qty 1

## 2015-04-23 MED ORDER — HYDROMORPHONE HCL 1 MG/ML IJ SOLN
0.5000 mg | Freq: Once | INTRAMUSCULAR | Status: AC
  Administered 2015-04-23: 0.5 mg via INTRAVENOUS
  Filled 2015-04-23: qty 1

## 2015-04-23 MED ORDER — LACTATED RINGERS IV SOLN
INTRAVENOUS | Status: DC | PRN
  Administered 2015-04-23: 16:00:00 via INTRAVENOUS

## 2015-04-23 MED ORDER — ONDANSETRON HCL 4 MG/2ML IJ SOLN
4.0000 mg | Freq: Once | INTRAMUSCULAR | Status: AC
  Administered 2015-04-23: 4 mg via INTRAVENOUS
  Filled 2015-04-23: qty 2

## 2015-04-23 MED ORDER — IOHEXOL 300 MG/ML  SOLN
INTRAMUSCULAR | Status: DC | PRN
  Administered 2015-04-23: 6 mL via URETHRAL

## 2015-04-23 MED ORDER — OXYCODONE-ACETAMINOPHEN 5-325 MG PO TABS
1.0000 | ORAL_TABLET | ORAL | Status: DC | PRN
  Filled 2015-04-23 (×2): qty 2

## 2015-04-23 MED ORDER — FENTANYL CITRATE (PF) 100 MCG/2ML IJ SOLN
INTRAMUSCULAR | Status: DC | PRN
  Administered 2015-04-23: 50 ug via INTRAVENOUS

## 2015-04-23 MED ORDER — HYDROMORPHONE HCL 1 MG/ML IJ SOLN
1.0000 mg | Freq: Once | INTRAMUSCULAR | Status: AC
  Administered 2015-04-23: 1 mg via INTRAMUSCULAR

## 2015-04-23 MED ORDER — INSULIN ASPART 100 UNIT/ML ~~LOC~~ SOLN
0.0000 [IU] | Freq: Three times a day (TID) | SUBCUTANEOUS | Status: DC
Start: 2015-04-24 — End: 2015-04-24

## 2015-04-23 MED ORDER — RAMIPRIL 10 MG PO CAPS
10.0000 mg | ORAL_CAPSULE | Freq: Every day | ORAL | Status: DC
  Administered 2015-04-24: 10 mg via ORAL
  Filled 2015-04-23: qty 1

## 2015-04-23 MED ORDER — ASPIRIN EC 81 MG PO TBEC
81.0000 mg | DELAYED_RELEASE_TABLET | Freq: Every day | ORAL | Status: DC
  Administered 2015-04-23 – 2015-04-24 (×2): 81 mg via ORAL
  Filled 2015-04-23 (×2): qty 1

## 2015-04-23 MED ORDER — LACTATED RINGERS IV SOLN: INTRAVENOUS | Status: DC

## 2015-04-23 MED ORDER — DIPHENHYDRAMINE HCL 12.5 MG/5ML PO ELIX: 12.5000 mg | ORAL_SOLUTION | Freq: Four times a day (QID) | ORAL | Status: DC | PRN

## 2015-04-23 MED ORDER — SODIUM CHLORIDE 0.9 % IV BOLUS (SEPSIS)
1000.0000 mL | Freq: Once | INTRAVENOUS | Status: AC
  Administered 2015-04-23: 1000 mL via INTRAVENOUS

## 2015-04-23 MED ORDER — DEXTROSE 5 % IV SOLN: 1.0000 g | INTRAVENOUS | Status: DC

## 2015-04-23 MED ORDER — INSULIN PUMP
Freq: Three times a day (TID) | SUBCUTANEOUS | Status: DC
  Administered 2015-04-23: 22:00:00 via SUBCUTANEOUS
  Administered 2015-04-23: 1 via SUBCUTANEOUS
  Administered 2015-04-24: 02:00:00 via SUBCUTANEOUS
  Filled 2015-04-23: qty 1

## 2015-04-23 MED ORDER — ZOLPIDEM TARTRATE 5 MG PO TABS: 5.0000 mg | ORAL_TABLET | Freq: Every evening | ORAL | Status: DC | PRN

## 2015-04-23 MED ORDER — ACETAMINOPHEN 325 MG PO TABS
650.0000 mg | ORAL_TABLET | ORAL | Status: DC | PRN
  Administered 2015-04-23: 650 mg via ORAL
  Filled 2015-04-23: qty 2

## 2015-04-23 MED ORDER — DIPHENHYDRAMINE HCL 50 MG/ML IJ SOLN: 12.5000 mg | Freq: Four times a day (QID) | INTRAMUSCULAR | Status: DC | PRN

## 2015-04-23 SURGICAL SUPPLY — 14 items
BAG URO CATCHER STRL LF (DRAPE) ×2 IMPLANT
BASKET LASER NITINOL 1.9FR (BASKET) IMPLANT
BASKET STNLS GEMINI 4WIRE 3FR (BASKET) IMPLANT
CATH INTERMIT  6FR 70CM (CATHETERS) IMPLANT
EXTRACTOR STONE NITINOL NGAGE (UROLOGICAL SUPPLIES) IMPLANT
GLOVE BIO SURGEON STRL SZ8 (GLOVE) ×2 IMPLANT
GOWN STRL REUS W/TWL LRG LVL3 (GOWN DISPOSABLE) ×2 IMPLANT
GUIDEWIRE ANG ZIPWIRE 038X150 (WIRE) ×2 IMPLANT
GUIDEWIRE STR DUAL SENSOR (WIRE) ×2 IMPLANT
MANIFOLD NEPTUNE II (INSTRUMENTS) ×2 IMPLANT
PACK CYSTO (CUSTOM PROCEDURE TRAY) ×2 IMPLANT
STENT CONTOUR 6FRX26X.038 (STENTS) ×2 IMPLANT
TUBE FEEDING 8FR 16IN STR KANG (MISCELLANEOUS) IMPLANT
TUBING CONNECTING 10 (TUBING) ×2 IMPLANT

## 2015-04-23 NOTE — Brief Op Note (Signed)
04/23/2015  5:46 PM  PATIENT:  Anthony Daniel.  64 y.o. male  PRE-OPERATIVE DIAGNOSIS:  URETERAL STONE  POST-OPERATIVE DIAGNOSIS:  URETERAL STONE  PROCEDURE:  Procedure(s): CYSTOSCOPY WITH URETEROSCOPY AND STENT PLACEMENT (Right)  SURGEON:  Surgeon(s) and Role:    * Cleon Gustin, MD - Primary  PHYSICIAN ASSISTANT:   ASSISTANTS: none   ANESTHESIA:   general  EBL:  Total I/O In: 600 [I.V.:600] Out: -   BLOOD ADMINISTERED:none  DRAINS: Urinary Catheter (Foley)   LOCAL MEDICATIONS USED:  NONE  SPECIMEN:  Source of Specimen:  right renal pelvis  DISPOSITION OF SPECIMEN:  N/A  COUNTS:  YES  TOURNIQUET:  * No tourniquets in log *  DICTATION: .Note written in [place]: Admit for overnight observation  PATIENT DISPOSITION:  PACU - hemodynamically stable.   Delay start of Pharmacological VTE agent (>24hrs) due to surgical blood loss or risk of bleeding: not applicable

## 2015-04-23 NOTE — ED Provider Notes (Signed)
CSN: [id number]     Arrival date & time 04/23/15  6948 History   First MD Initiated Contact with Patient 04/23/15 [phone]     Chief Complaint  Patient presents with  . Flank Pain  . Nephrolithiasis    HPI  Patient presents for 4 hours after recent evaluation for similar pain. Yesterday I evaluated the patient for right abdominal pain. He notes that since discharge he has recently begun to have only transient relief with home narcotic use, both oxycodone and Vicodin. The pain does resolve, but only for about 30 minutes, then returns, in the same distribution, right lateral, right inguinal, sore, severe, crampy. No urinary changes, no hematuria. No vomiting, though he is nauseous. No fever, chills.   Past Medical History  Diagnosis Date  . Diabetes mellitus without complication ([place])   . Hyperlipidemia   . Hypertension   . Cancer [facility] 1996    bladder cancer  . Stones in the urinary tract    Past Surgical History  Procedure Laterality Date  . Tonsillectomy  1975  . Bladder surgery    . Appendectomy  11/07/2012    Dr Ninfa Linden  . Laparoscopic appendectomy N/A 11/07/2012    Procedure: APPENDECTOMY LAPAROSCOPIC;  Surgeon: Harl Bowie, MD;  Location: [facility] OR;  Service: General;  Laterality: N/A;   Family History  Problem Relation Age of Onset  . Cancer Other     Colon and Prostate Cancer  . Hypertension Other   . Diabetes Other   . Alcohol abuse Neg Hx   . Drug abuse Neg Hx   . Early death Neg Hx   . Heart disease Neg Hx   . Hyperlipidemia Neg Hx   . Kidney disease Neg Hx   . Stroke Neg Hx    Social History  Substance Use Topics  . Smoking status: Current Every Day Smoker -- 1.00 packs/day for 30 years    Types: Cigarettes  . Smokeless tobacco: Never Used  . Alcohol Use: 3.0 oz/week    5 Shots of liquor per week     Comment: occasional    Review of Systems  Constitutional:       Per HPI, otherwise negative  HENT:       Per HPI, otherwise negative   Respiratory:       Per HPI, otherwise negative  Cardiovascular:       Per HPI, otherwise negative  Gastrointestinal: Positive for vomiting.  Endocrine:       Negative aside from HPI  Genitourinary:       Neg aside from HPI   Musculoskeletal:       Per HPI, otherwise negative  Skin: Negative.   Neurological: Negative for syncope.      Allergies  Crestor; Lipitor; and Contrast media  Home Medications   Prior to Admission medications   Medication Sig Start Date End Date Taking? Authorizing Provider  aspirin EC 81 MG tablet Take 81 mg by mouth daily.    Historical Provider, MD  B-D INS SYR ULTRAFINE 1CC/30G 30G X 1/2" 1 ML MISC See admin instructions. 11/13/14   Historical Provider, MD  cholecalciferol (VITAMIN D) 1000 UNITS tablet Take 1,000 Units by mouth daily.    Historical Provider, MD  Ciclopirox 0.77 % gel Apply 1 Act topically 2 (two) times daily. Patient taking differently: Apply 1 Act topically 2 (two) times daily. Uses on ankle and feet 04/09/15   Janith Lima, MD  Dapagliflozin Propanediol (FARXIGA) 10 MG TABS TAKE 1 TABLET  BY MOUTH DAILY. 04/21/14   Janith Lima, MD  HYDROcodone-acetaminophen (NORCO/VICODIN) 5-325 MG tablet Take 1 tablet by mouth every 6 (six) hours as needed for severe pain. 04/22/15   Carmin Muskrat, MD  Insulin Glargine (LANTUS SOLOSTAR [place]) Inject 40 Units into the skin 2 (two) times daily. Only uses when pump goes out    Historical Provider, MD  Insulin Human (INSULIN PUMP) SOLN Inject into the skin continuous. *Novolog*    Historical Provider, MD  Meloxicam (VIVLODEX) 10 MG CAPS Take 1 capsule by mouth daily. 11/10/14   Janith Lima, MD  ondansetron (ZOFRAN ODT) 4 MG disintegrating tablet Take 1 tablet (4 mg total) by mouth every 8 (eight) hours as needed for nausea or vomiting. 04/22/15   Carmin Muskrat, MD  oxymetazoline (AFRIN) 0.05 % nasal spray Place 1 spray into both nostrils 2 (two) times daily as needed for congestion.    Historical  Provider, MD  Pitavastatin Calcium (LIVALO) 2 MG TABS Take 2 mg by mouth daily.     Historical Provider, MD  Potassium Citrate 15 MEQ (1620 MG) TBCR Take 1 tablet by mouth 2 (two) times daily.    Historical Provider, MD  ramipril (ALTACE) 10 MG tablet Take 10 mg by mouth daily before breakfast.     Historical Provider, MD  tadalafil (CIALIS) 20 MG tablet Take 20 mg by mouth daily as needed for erectile dysfunction.    Historical Provider, MD  tamsulosin (FLOMAX) 0.4 MG CAPS capsule Take 1 capsule (0.4 mg total) by mouth daily. 04/22/15   Carmin Muskrat, MD  triamcinolone cream (KENALOG) 0.5 % Apply 1 application topically 3 (three) times daily. 04/09/15   Janith Lima, MD  vitamin B-12 (CYANOCOBALAMIN) 500 MCG tablet Take 500 mcg by mouth daily.    Historical Provider, MD  ZETIA 10 MG tablet TAKE 1 TABLET (10 MG TOTAL) BY MOUTH DAILY. 09/07/14   Janith Lima, MD   BP 165/62 mmHg  Pulse 70  Temp(Src) 98.9 F (37.2 C) (Oral)  Resp 18  SpO2 100% Physical Exam  Constitutional: He is oriented to person, place, and time. He appears well-developed. No distress.  HENT:  Head: Normocephalic and atraumatic.  Eyes: Conjunctivae and EOM are normal.  Cardiovascular: Normal rate and regular rhythm.   Pulmonary/Chest: Effort normal. No stridor. No respiratory distress.  Abdominal: He exhibits no distension. There is no tenderness.  Musculoskeletal: He exhibits no edema.  Neurological: He is alert and oriented to person, place, and time.  Skin: Skin is warm and dry.  Psychiatric: He has a normal mood and affect.  Nursing note and vitals reviewed.   ED Course  Procedures (including critical care time) Labs Review Labs Reviewed  CBC WITH DIFFERENTIAL/PLATELET  COMPREHENSIVE METABOLIC PANEL      With concern for possible obstruction versus infection, CT ordered   Imaging Review US Renal - yesterday  IMPRESSION: 1. Numerous bilateral renal calculi. 2. No evidence of hydronephrosis  involving either kidney to suggest urinary tract obstruction. 3. Likely calyceal diverticulum involving the upper pole the right kidney. Benign cyst involving the upper pole of the left kidney. 4. Borderline to mild prostate gland enlargement, measured above.   Electronically Signed   By: Evangeline Dakin M.D.   On: 04/22/2015 09:32    Update: Patient now comfortable, smiling.    Ct Renal Stone Study  04/23/2015   CLINICAL DATA:  64 year old male with severe right flank pain, not improved with medical therapy. Urologic stones. Subsequent encounter.  EXAM: CT ABDOMEN AND PELVIS WITHOUT CONTRAST  TECHNIQUE: Multidetector CT imaging of the abdomen and pelvis was performed following the standard protocol without IV contrast.  COMPARISON:  Renal ultrasound 04/22/2015. CT Abdomen and Pelvis 08/25/2012.  FINDINGS: Mild dependent pulmonary atelectasis. No pericardial or pleural effusion.  No acute osseous abnormality identified.  No pelvic free fluid. Negative distal colon aside from retained stool. Redundant sigmoid colon extends into the epigastrium with intermittent retained gas and stool. Negative left colon. Retained stool throughout the transverse colon. Redundant hepatic flexure. Negative right colon with evidence of previous appendectomy. Negative terminal ileum. No dilated small bowel.  Negative noncontrast stomach, duodenum, liver, gallbladder, spleen, pancreas, and adrenal glands.  Numerous bilateral nephrolithiasis. Confluent calculi in the left lower pole measuring up to 13 mm. No left perinephric or periureteral stranding. However, on the right there is moderate perinephric stranding and stranding a trace fluid along the proximal right ureter just past the ureteral pelvic junction. There is a bulky oval 9-10 mm stone lodged in the proximal right ureter (coronal image 60). Only mild right hydronephrosis. Periureteral stranding/ fluid decreases distally. The right ureter is decompressed in the pelvis.  Unremarkable bladder.  Superimposed chronic Aortoiliac calcified atherosclerosis noted. No other abdominal free fluid.  IMPRESSION: 1. Acute obstructive uropathy on the right with suspected forniceal rupture. Bulky 10 mm calculus lodged in the ureter just beyond the right UPJ with mild right hydronephrosis. 2. Numerous bilateral nephrolithiasis. 3. No other acute finding in the abdomen or pelvis.   Electronically Signed   By: Genevie Ann M.D.   On: 04/23/2015 08:58      I have personally reviewed and evaluated these images and lab results as part of my medical decision-making.   MDM  Patient with history of kidney stones presents one day after recent evaluation. Notably, I evaluated the patient with yesterday and today. Yesterday, the patient's evaluation was largely reassuring, with ultrasound demonstrating a any evidence for obstruction. The patient's pain was well controlled, and with no ongoing symptoms, he was discharged with ongoing medication to follow-up with urology as an outpatient. Today the patient returns with recurrence of pain, with only transient relief in spite of using appropriate medication. Notable, the patient's imaging today demonstrates acute obstruction, with possible forniceal rupture. Patient has leukocytosis, but no fever. Symptoms well controlled again, with fluids, analgesia. I discussed patient's case with our urology colleagues for admission, further evaluation, management.   Carmin Muskrat, MD 04/23/15 2135

## 2015-04-23 NOTE — ED Notes (Signed)
Pt states he was seen here yesterday for kidney stone and sent home with medications. Pt c/o severe right flank pain that isnt being controled by the Vicodin prescribed.  Pt denies n/v or dysuria this am.

## 2015-04-23 NOTE — Op Note (Signed)
.  Preoperative diagnosis: right UPJ stone, sepsis  Postoperative diagnosis: Same  Procedure: 1 cystoscopy 2. right retrograde pyelography 3.  Intraoperative fluoroscopy, under one hour, with interpretation 4. right 6 x 26 JJ stent placement  Attending: Nicolette Bang  Anesthesia: General  Estimated blood loss: None  Drains: Right 6 x 26 JJ ureteral stent without tether, 16 French foley catheter  Specimens: right renal pelvis urine for culture  Antibiotics: Rocephin  Findings:right UPJ stone. Moderate hydronephrosis. No masses/lesions in the bladder. Ureteral orifices in normal anatomic location.  Indications: Patient is a 64 year old male/male with a history of right renal stone and concern for sepsis.  After discussing treatment options, they decided proceed with right stent placement.  Procedure her in detail: The patient was brought to the operating room and a brief timeout was done to ensure correct patient, correct procedure, correct site.  General anesthesia was administered patient was placed in dorsal lithotomy position.  Their genitalia was then prepped and draped in usual sterile fashion.  A rigid 34 French cystoscope was passed in the urethra and the bladder.  Bladder was inspected free masses or lesions.  the ureteral orifices were in the normal orthotopic locations.  a 6 french ureteral catheter was then instilled into the right ureteral orifice. We advanced the catheter up to the renal pelvis and obtained a urine specimen for culture. a gentle retrograde was obtained and findings noted above.  we then placed a zip wire through the ureteral catheter and advanced up to the renal pelvis.    We then placed a 6 x 26 double-j ureteral stent over the original zip wire.  We then removed the wire and good coil was noted in the the renal pelvis under fluoroscopy and the bladder under direct vision.  A foley catheter was then placed. the bladder was then drained and this concluded the  procedure which was well tolerated by patient.  Complications: None  Condition: Stable, extubated, transferred to PACU  Plan: Patient is to be admitted for IV antibiotics. He will have his stone extraction in 2 weeks.

## 2015-04-23 NOTE — Consult Note (Signed)
Urology Consult  Referring physician: Carmin Muskrat Reason for referral: ureteral stone  Chief Complaint: right flank pain  History of Present Illness: Anthony Daniel is a 64yo with a 24 hour hx severe sharp, intermittent right flank pain. Ct scan shows a 1cm right proximal ureteral stone with moderate hydro and forniceal rupture. WBC count is 18.9. He has had multiple stone events in the past. Of note he also has multiple left lower pole calculi. He has associated nausea and vomiting. He denies any fevers.   Past Medical History  Diagnosis Date  . Diabetes mellitus without complication ([place])   . Hyperlipidemia   . Hypertension   . Cancer [facility]) 1996    bladder cancer  . Stones in the urinary tract    Past Surgical History  Procedure Laterality Date  . Tonsillectomy  1975  . Bladder surgery    . Appendectomy  11/07/2012    Dr Ninfa Linden  . Laparoscopic appendectomy N/A 11/07/2012    Procedure: APPENDECTOMY LAPAROSCOPIC;  Surgeon: Harl Bowie, MD;  Location: [facility];  Service: General;  Laterality: N/A;    Medications: I have reviewed the patient's current medications. Allergies:  Allergies  Allergen Reactions  . Crestor [Rosuvastatin] Other (See Comments)    Muscle aches  . Lipitor [Atorvastatin]     Muscle aches  . Contrast Media [Iodinated Diagnostic Agents] Nausea And Vomiting    PT TRIED THIS TWICE.EVEN WITH PRE-MEDS AND STILL HAD NAUSEA AND ITCHING--WAS TOLD HE CAN NOT TAKE iv ANYMORE  . Latex Other (See Comments)    Irritation  . Adhesive [Tape] Rash  . Peanut-Containing Drug Products Anxiety and Other (See Comments)    Jittery    Family History  Problem Relation Age of Onset  . Cancer Other     Colon and Prostate Cancer  . Hypertension Other   . Diabetes Other   . Alcohol abuse Neg Hx   . Drug abuse Neg Hx   . Early death Neg Hx   . Heart disease Neg Hx   . Hyperlipidemia Neg Hx   . Kidney disease Neg Hx   . Stroke Neg Hx    Social History:  reports that  he has been smoking Cigarettes.  He has a 30 pack-year smoking history. He has never used smokeless tobacco. He reports that he drinks about 3.0 oz of alcohol per week. He reports that he does not use illicit drugs.  Review of Systems  Gastrointestinal: Positive for nausea and vomiting.  Genitourinary: Positive for flank pain.  All other systems reviewed and are negative.   Physical Exam:  Vital signs in last 24 hours: Temp:  [98.3 F (36.8 C)-98.9 F (37.2 C)] 98.3 F (36.8 C) (10/06 1145) Pulse Rate:  [60-70] 60 (10/06 1145) Resp:  [17-18] 18 (10/06 1145) BP: (144-165)/(62-71) 144/65 mmHg (10/06 1145) SpO2:  [98 %-100 %] 98 % (10/06 1145) Weight:  [113.9 kg (251 lb 1.7 oz)] 113.9 kg (251 lb 1.7 oz) (10/06 1145) Physical Exam  Constitutional: He is oriented to person, place, and time. He appears well-developed and well-nourished.  HENT:  Head: Normocephalic and atraumatic.  Eyes: EOM are normal. Pupils are equal, round, and reactive to light.  Neck: Normal range of motion. No thyromegaly present.  Cardiovascular: Normal rate and regular rhythm.   Respiratory: Effort normal. No respiratory distress.  GI: Soft. He exhibits no distension.  Genitourinary: Penis normal. No penile tenderness.  Musculoskeletal: Normal range of motion. He exhibits no edema.  Neurological: He is alert  and oriented to person, place, and time.  Skin: Skin is warm and dry.  Psychiatric: He has a normal mood and affect. His behavior is normal. Judgment and thought content normal.    Laboratory Data:  Results for orders placed or performed during the hospital encounter of 04/23/15 (from the past 72 hour(s))  CBC with Differential     Status: Abnormal   Collection Time: 04/23/15  7:54 AM  Result Value Ref Range   WBC 18.7 (H) 4.0 - 10.5 K/uL   RBC 5.29 4.22 - 5.81 MIL/uL   Hemoglobin 15.8 13.0 - 17.0 g/dL   HCT 47.5 39.0 - 52.0 %   MCV 89.8 78.0 - 100.0 fL   MCH 29.9 26.0 - 34.0 pg   MCHC 33.3 30.0 -  36.0 g/dL   RDW 15.5 11.5 - 15.5 %   Platelets 163 150 - 400 K/uL   Neutrophils Relative % 76 %   Neutro Abs 14.3 (H) 1.7 - 7.7 K/uL   Lymphocytes Relative 17 %   Lymphs Abs 3.1 0.7 - 4.0 K/uL   Monocytes Relative 6 %   Monocytes Absolute 1.2 (H) 0.1 - 1.0 K/uL   Eosinophils Relative 1 %   Eosinophils Absolute 0.1 0.0 - 0.7 K/uL   Basophils Relative 0 %   Basophils Absolute 0.0 0.0 - 0.1 K/uL  Comprehensive metabolic panel     Status: Abnormal   Collection Time: 04/23/15  7:54 AM  Result Value Ref Range   Sodium 135 135 - 145 mmol/L   Potassium 4.5 3.5 - 5.1 mmol/L   Chloride 103 101 - 111 mmol/L   CO2 25 22 - 32 mmol/L   Glucose, Bld 167 (H) 65 - 99 mg/dL   BUN 24 (H) 6 - 20 mg/dL   Creatinine, Ser 1.98 (H) 0.61 - 1.24 mg/dL   Calcium 8.5 (L) 8.9 - 10.3 mg/dL   Total Protein 7.1 6.5 - 8.1 g/dL   Albumin 4.0 3.5 - 5.0 g/dL   AST 23 15 - 41 U/L   ALT 22 17 - 63 U/L   Alkaline Phosphatase 90 38 - 126 U/L   Total Bilirubin 0.7 0.3 - 1.2 mg/dL   GFR calc non Af Amer 34 (L) >60 mL/min   GFR calc Af Amer 40 (L) >60 mL/min    Comment: (NOTE) The eGFR has been calculated using the CKD EPI equation. This calculation has not been validated in all clinical situations. eGFR's persistently <60 mL/min signify possible Chronic Kidney Disease.    Anion gap 7 5 - 15  Urinalysis, Routine w reflex microscopic     Status: Abnormal   Collection Time: 04/23/15  9:15 AM  Result Value Ref Range   Color, Urine YELLOW YELLOW   APPearance CLEAR CLEAR   Specific Gravity, Urine 1.035 (H) 1.005 - 1.030   pH 5.0 5.0 - 8.0   Glucose, UA >1000 (A) NEGATIVE mg/dL   Hgb urine dipstick NEGATIVE NEGATIVE   Bilirubin Urine NEGATIVE NEGATIVE   Ketones, ur 15 (A) NEGATIVE mg/dL   Protein, ur NEGATIVE NEGATIVE mg/dL   Urobilinogen, UA 0.2 0.0 - 1.0 mg/dL   Nitrite NEGATIVE NEGATIVE   Leukocytes, UA NEGATIVE NEGATIVE  Urine microscopic-add on     Status: None   Collection Time: 04/23/15  9:15 AM   Result Value Ref Range   WBC, UA 0-2 <3 WBC/hpf   RBC / HPF 0-2 <3 RBC/hpf  Surgical pcr screen     Status: None   Collection Time:  04/23/15 12:54 PM  Result Value Ref Range   MRSA, PCR NEGATIVE NEGATIVE   Staphylococcus aureus NEGATIVE NEGATIVE    Comment:        The Xpert SA Assay (FDA approved for NASAL specimens in patients over 34 years of age), is one component of a comprehensive surveillance program.  Test performance has been validated by [facility] for patients greater than or equal to 46 year old. It is not intended to diagnose infection nor to guide or monitor treatment.    Recent Results (from the past 240 hour(s))  Surgical pcr screen     Status: None   Collection Time: 04/23/15 12:54 PM  Result Value Ref Range Status   MRSA, PCR NEGATIVE NEGATIVE Final   Staphylococcus aureus NEGATIVE NEGATIVE Final    Comment:        The Xpert SA Assay (FDA approved for NASAL specimens in patients over 55 years of age), is one component of a comprehensive surveillance program.  Test performance has been validated by [facility] for patients greater than or equal to 82 year old. It is not intended to diagnose infection nor to guide or monitor treatment.    Creatinine:  Recent Labs  04/23/15 0754  CREATININE 1.98*   Baseline Creatinine: unknown  Impression/Assessment:  64yo with right ureteral calculi, leukocytosis  Plan:  1. The risks/benefits/alternatives to right ureteral stent placement was explained to the patient and he understands and wishes to proceed with surgery. 2. Pt will be admitted overnight for observation and IV antibiotics  Alexander Mcauley L 04/23/2015, 4:42 PM

## 2015-04-23 NOTE — Anesthesia Preprocedure Evaluation (Signed)
Anesthesia Evaluation  Patient identified by MRN, date of birth, ID band Patient awake    Reviewed: Allergy & Precautions, H&P , NPO status , Patient's Chart, lab work & pertinent test results, reviewed documented beta blocker date and time   History of Anesthesia Complications Negative for: history of anesthetic complications  Airway Mallampati: II  TM Distance: >3 FB Neck ROM: Full    Dental  (+) Teeth Intact, Dental Advisory Given   Pulmonary Current Smoker,    breath sounds clear to auscultation       Cardiovascular hypertension, Pt. on medications  Rhythm:Regular Rate:Normal     Neuro/Psych negative neurological ROS  negative psych ROS   GI/Hepatic negative GI ROS, Neg liver ROS,   Endo/Other  diabetes, Well Controlled, Type 1, Insulin Dependent  Renal/GU negative Renal ROS     Musculoskeletal negative musculoskeletal ROS (+)   Abdominal   Peds  Hematology negative hematology ROS (+)   Anesthesia Other Findings   Reproductive/Obstetrics negative OB ROS                             Anesthesia Physical  Anesthesia Plan  ASA: III  Anesthesia Plan: General   Post-op Pain Management:    Induction: Intravenous  Airway Management Planned: Oral ETT and LMA  Additional Equipment:   Intra-op Plan:   Post-operative Plan: Extubation in OR  Informed Consent:   Plan Discussed with: CRNA and Surgeon  Anesthesia Plan Comments:         Anesthesia Quick Evaluation

## 2015-04-23 NOTE — Transfer of Care (Signed)
Immediate Anesthesia Transfer of Care Note  Patient: Anthony Daniel.  Procedure(s) Performed: Procedure(s): CYSTOSCOPY WITH URETEROSCOPY RETROGRADE PYELOGRAM AND STENT PLACEMENT (Right)  Patient Location: PACU  Anesthesia Type:General  Level of Consciousness:  sedated, patient cooperative and responds to stimulation  Airway & Oxygen Therapy:Patient Spontanous Breathing and Patient connected to face mask oxgen  Post-op Assessment:  Report given to PACU RN and Post -op Vital signs reviewed and stable  Post vital signs:  Reviewed and stable  Last Vitals:  Filed Vitals:   04/23/15 1145  BP: 144/65  Pulse: 60  Temp: 36.8 C  Resp: 18    Complications: No apparent anesthesia complications

## 2015-04-23 NOTE — Anesthesia Postprocedure Evaluation (Signed)
  Anesthesia Post-op Note  Patient: Anthony Daniel.  Procedure(s) Performed: Procedure(s) (LRB): CYSTOSCOPY WITH URETEROSCOPY RETROGRADE PYELOGRAM AND STENT PLACEMENT (Right)  Patient Location: PACU  Anesthesia Type: General  Level of Consciousness: awake and alert   Airway and Oxygen Therapy: Patient Spontanous Breathing  Post-op Pain: mild  Post-op Assessment: Post-op Vital signs reviewed, Patient's Cardiovascular Status Stable, Respiratory Function Stable, Patent Airway and No signs of Nausea or vomiting  Last Vitals:  Filed Vitals:   04/23/15 1800  BP: 138/70  Pulse: 69  Temp: 37 C  Resp: 12    Post-op Vital Signs: stable   Complications: No apparent anesthesia complications

## 2015-04-23 NOTE — ED Notes (Signed)
Spoke with Sonia Baller on 4th floor. Patient can be transported @ 1125.

## 2015-04-24 ENCOUNTER — Encounter (HOSPITAL_COMMUNITY): Payer: Self-pay | Admitting: Urology

## 2015-04-24 LAB — GLUCOSE, CAPILLARY: Glucose-Capillary: 148 mg/dL — ABNORMAL HIGH (ref 65–99)

## 2015-04-24 LAB — BASIC METABOLIC PANEL
Anion gap: 6 (ref 5–15)
BUN: 17 mg/dL (ref 6–20)
CO2: 22 mmol/L (ref 22–32)
Calcium: 8.3 mg/dL — ABNORMAL LOW (ref 8.9–10.3)
Chloride: 109 mmol/L (ref 101–111)
Creatinine, Ser: 1.16 mg/dL (ref 0.61–1.24)
GFR calc Af Amer: >60 mL/min (ref >60)
GFR calc non Af Amer: >60 mL/min (ref >60)
Glucose, Bld: 130 mg/dL — ABNORMAL HIGH (ref 65–99)
Potassium: 4.9 mmol/L (ref 3.5–5.1)
Sodium: 137 mmol/L (ref 135–145)

## 2015-04-24 LAB — URINE CULTURE: Culture: NO GROWTH

## 2015-04-24 LAB — CBC
HCT: 44.6 % (ref 39.0–52.0)
Hemoglobin: 14.3 g/dL (ref 13.0–17.0)
MCH: 29.1 pg (ref 26.0–34.0)
MCHC: 32.1 g/dL (ref 30.0–36.0)
MCV: 90.8 fL (ref 78.0–100.0)
Platelets: 164 10*3/uL (ref 150–400)
RBC: 4.91 MIL/uL (ref 4.22–5.81)
RDW: 15.6 % — ABNORMAL HIGH (ref 11.5–15.5)
WBC: 13 10*3/uL — ABNORMAL HIGH (ref 4.0–10.5)

## 2015-04-24 MED ORDER — OXYBUTYNIN CHLORIDE ER 15 MG PO TB24: 15.0000 mg | ORAL_TABLET | Freq: Every day | ORAL | Status: DC

## 2015-04-24 MED ORDER — HYDROCODONE-ACETAMINOPHEN 5-325 MG PO TABS
1.0000 | ORAL_TABLET | ORAL | Status: DC | PRN
  Administered 2015-04-24: 1 via ORAL
  Filled 2015-04-24: qty 1

## 2015-04-24 MED ORDER — SULFAMETHOXAZOLE-TRIMETHOPRIM 800-160 MG PO TABS
1.0000 | ORAL_TABLET | Freq: Two times a day (BID) | ORAL | Status: DC
Start: 2015-04-24 — End: 2015-05-12

## 2015-04-24 MED ORDER — HYDROCODONE-ACETAMINOPHEN 10-325 MG PO TABS: 1.0000 | ORAL_TABLET | Freq: Four times a day (QID) | ORAL | Status: DC | PRN

## 2015-04-24 NOTE — Discharge Instructions (Signed)
Kidney Stones °Kidney stones (urolithiasis) are deposits that form inside your kidneys. The intense pain is caused by the stone moving through the urinary tract. When the stone moves, the ureter goes into spasm around the stone. The stone is usually passed in the urine.  °CAUSES  °· A disorder that makes certain neck glands produce too much parathyroid hormone (primary hyperparathyroidism). °· A buildup of uric acid crystals, similar to gout in your joints. °· Narrowing (stricture) of the ureter. °· A kidney obstruction present at birth (congenital obstruction). °· Previous surgery on the kidney or ureters. °· Numerous kidney infections. °SYMPTOMS  °· Feeling sick to your stomach (nauseous). °· Throwing up (vomiting). °· Blood in the urine (hematuria). °· Pain that usually spreads (radiates) to the groin. °· Frequency or urgency of urination. °DIAGNOSIS  °· Taking a history and physical exam. °· Blood or urine tests. °· CT scan. °· Occasionally, an examination of the inside of the urinary bladder (cystoscopy) is performed. °TREATMENT  °· Observation. °· Increasing your fluid intake. °· Extracorporeal shock wave lithotripsy--This is a noninvasive procedure that uses shock waves to break up kidney stones. °· Surgery may be needed if you have severe pain or persistent obstruction. There are various surgical procedures. Most of the procedures are performed with the use of small instruments. Only small incisions are needed to accommodate these instruments, so recovery time is minimized. °The size, location, and chemical composition are all important variables that will determine the proper choice of action for you. Talk to your health care provider to better understand your situation so that you will minimize the risk of injury to yourself and your kidney.  °HOME CARE INSTRUCTIONS  °· Drink enough water and fluids to keep your urine clear or pale yellow. This will help you to pass the stone or stone fragments. °· Strain  all urine through the provided strainer. Keep all particulate matter and stones for your health care provider to see. The stone causing the pain may be as small as a grain of salt. It is very important to use the strainer each and every time you pass your urine. The collection of your stone will allow your health care provider to analyze it and verify that a stone has actually passed. The stone analysis will often identify what you can do to reduce the incidence of recurrences. °· Only take over-the-counter or prescription medicines for pain, discomfort, or fever as directed by your health care provider. °· Keep all follow-up visits as told by your health care provider. This is important. °· Get follow-up X-rays if required. The absence of pain does not always mean that the stone has passed. It may have only stopped moving. If the urine remains completely obstructed, it can cause loss of kidney function or even complete destruction of the kidney. It is your responsibility to make sure X-rays and follow-ups are completed. Ultrasounds of the kidney can show blockages and the status of the kidney. Ultrasounds are not associated with any radiation and can be performed easily in a matter of minutes. °· Make changes to your daily diet as told by your health care provider. You may be told to: °¨ Limit the amount of salt that you eat. °¨ Eat 5 or more servings of fruits and vegetables each day. °¨ Limit the amount of meat, poultry, fish, and eggs that you eat. °· Collect a 24-hour urine sample as told by your health care provider. You may need to collect another urine sample every 6-12   months. °SEEK MEDICAL CARE IF: °· You experience pain that is progressive and unresponsive to any pain medicine you have been prescribed. °SEEK IMMEDIATE MEDICAL CARE IF:  °· Pain cannot be controlled with the prescribed medicine. °· You have a fever or shaking chills. °· The severity or intensity of pain increases over 18 hours and is not  relieved by pain medicine. °· You develop a new onset of abdominal pain. °· You feel faint or pass out. °· You are unable to urinate. °  °This information is not intended to replace advice given to you by your health care provider. Make sure you discuss any questions you have with your health care provider. °  °Document Released: 07/04/2005 Document Revised: 03/25/2015 Document Reviewed: 12/05/2012 °Elsevier Interactive Patient Education ©2016 [employer] ° °

## 2015-04-24 NOTE — Progress Notes (Signed)
Catheter removed. Patient voided. Completed D/C teaching with patient. Pt will be D/C home with family.

## 2015-04-24 NOTE — Progress Notes (Signed)
Pt already discharged. Wasted 2 percocet tablets in sink with Catie Conley Canal, Therapist, sports.

## 2015-04-29 NOTE — Discharge Summary (Addendum)
Physician Discharge Summary  [name]. MRN: [id number] DOB/AGE: July 14, 1951 64 y.o.  Admit date: 04/23/2015 Discharge date: 04/24/2015  Admission Diagnoses: right ureteral calculus  Discharge Diagnoses:  Active Problems:   Ureteral stone   Discharged Condition: good  Hospital Course: The patient tolerated the procedure well and was transferred to the floor on IV pain meds, IV fluid. On POD#1 foley was removed, pt was started on regular and they ambulated in the halls. The IVFs were discontinued, and the patient passed flatus. Prior to discharge the pt was tolerating a regular diet, pain was controlled on PO pain meds, they were ambulating without difficulty, and they had normal bowel function.   Consults: None  Significant Diagnostic Studies: none  Treatments: IV hydration and antibiotics: ceftriaxone  Discharge Exam: Blood pressure 139/66, pulse 63, temperature 98.3 F (36.8 C), temperature source Oral, resp. rate 18, height 6\' 1"  (1.854 m), weight 113.9 kg (251 lb 1.7 oz), SpO2 95 %. General appearance: alert, cooperative and appears stated age Head: Normocephalic, without obvious abnormality, atraumatic Eyes: conjunctivae/corneas clear. PERRL, EOM's intact. Fundi benign. Nose: Nares normal. Septum midline. Mucosa normal. No drainage or sinus tenderness. Resp: clear to auscultation bilaterally Cardio: regular rate and rhythm, S1, S2 normal, no murmur, click, rub or gallop GI: soft, non-tender; bowel sounds normal; no masses,  no organomegaly Male genitalia: normal Extremities: extremities normal, atraumatic, no cyanosis or edema Pulses: 2+ and symmetric  Disposition: 01-Home or Self Care     Medication List    TAKE these medications        aspirin EC 81 MG tablet  Take 81 mg by mouth daily.     B-D INS SYR ULTRAFINE 1CC/30G 30G X 1/2" 1 ML Misc  Generic drug:  Insulin Syringe-Needle U-100  See admin instructions.     cholecalciferol 1000 UNITS  tablet  Commonly known as:  VITAMIN D  Take 1,000 Units by mouth daily.     Ciclopirox 0.77 % gel  Apply 1 Act topically 2 (two) times daily.     dapagliflozin propanediol 10 MG Tabs tablet  Commonly known as:  FARXIGA  TAKE 1 TABLET BY MOUTH DAILY.     HYDROcodone-acetaminophen 5-325 MG tablet  Commonly known as:  NORCO/VICODIN  Take 1 tablet by mouth every 6 (six) hours as needed for severe pain.     HYDROcodone-acetaminophen 10-325 MG tablet  Commonly known as:  NORCO  Take 1 tablet by mouth every 6 (six) hours as needed.     insulin pump Soln  Inject 1 each into the skin continuous. novolog     LANTUS SOLOSTAR [place]  Inject 35-45 Units into the skin 2 (two) times daily as needed. Only uses when pump goes out     LANTUS 100 UNIT/ML injection  Generic drug:  insulin glargine  Inject 35-45 Units as directed 2 (two) times daily as needed (when pump doesnt work).     LIVALO 2 MG Tabs  Generic drug:  Pitavastatin Calcium  Take 2 mg by mouth daily.     Meloxicam 10 MG Caps  Commonly known as:  VIVLODEX  Take 1 capsule by mouth daily.     ondansetron 4 MG disintegrating tablet  Commonly known as:  ZOFRAN ODT  Take 1 tablet (4 mg total) by mouth every 8 (eight) hours as needed for nausea or vomiting.     oxybutynin 15 MG 24 hr tablet  Commonly known as:  DITROPAN XL  Take 1 tablet (15 mg total)  by mouth at bedtime.     oxymetazoline 0.05 % nasal spray  Commonly known as:  AFRIN  Place 1 spray into both nostrils 2 (two) times daily as needed for congestion.     Potassium Citrate 15 MEQ (1620 MG) Tbcr  Take 1 tablet by mouth 2 (two) times daily.     ramipril 10 MG tablet  Commonly known as:  ALTACE  Take 10 mg by mouth daily before breakfast.     sulfamethoxazole-trimethoprim 800-160 MG tablet  Commonly known as:  BACTRIM DS,SEPTRA DS  Take 1 tablet by mouth 2 (two) times daily.     tadalafil 20 MG tablet  Commonly known as:  CIALIS  Take 20 mg by mouth daily as  needed for erectile dysfunction.     tamsulosin 0.4 MG Caps capsule  Commonly known as:  FLOMAX  Take 1 capsule (0.4 mg total) by mouth daily.     triamcinolone cream 0.5 %  Commonly known as:  KENALOG  Apply 1 application topically 3 (three) times daily.     vitamin B-12 500 MCG tablet  Commonly known as:  CYANOCOBALAMIN  Take 500 mcg by mouth daily.     ZETIA 10 MG tablet  Generic drug:  ezetimibe  TAKE 1 TABLET (10 MG TOTAL) BY MOUTH DAILY.           Follow-up Information    Follow up with Sloane Junkin L, MD. Call in 2 weeks.   Specialty:  Urology   Why:  cystoscopy with stone extraction   Contact information:   [place] [address] [phone]       Signed: Cleon Gustin 04/29/2015, 11:42 AM

## 2015-05-01 ENCOUNTER — Other Ambulatory Visit: Payer: Self-pay | Admitting: Urology

## 2015-05-04 ENCOUNTER — Other Ambulatory Visit: Payer: Self-pay | Admitting: Urology

## 2015-05-05 ENCOUNTER — Encounter (HOSPITAL_BASED_OUTPATIENT_CLINIC_OR_DEPARTMENT_OTHER): Payer: Self-pay | Admitting: *Deleted

## 2015-05-07 ENCOUNTER — Encounter (HOSPITAL_BASED_OUTPATIENT_CLINIC_OR_DEPARTMENT_OTHER): Payer: Self-pay | Admitting: *Deleted

## 2015-05-07 ENCOUNTER — Other Ambulatory Visit: Payer: Self-pay | Admitting: Internal Medicine

## 2015-05-07 DIAGNOSIS — G4730 Sleep apnea, unspecified: Secondary | ICD-10-CM

## 2015-05-07 NOTE — Progress Notes (Signed)
   05/07/15 1612  OBSTRUCTIVE SLEEP APNEA  Have you ever been diagnosed with sleep apnea through a sleep study? No  Do you snore loudly (loud enough to be heard through closed doors)?  1  Do you often feel tired, fatigued, or sleepy during the daytime (such as falling asleep during driving or talking to someone)? 0  Has anyone observed you stop breathing during your sleep? 0  Do you have, or are you being treated for high blood pressure? 1  BMI more than 35 kg/m2? 0  Age > 50 (1-yes) 1  Neck circumference greater than:Male 16 inches or larger, Male 17inches or larger? 1  Male Gender (Yes=1) 1  Obstructive Sleep Apnea Score 5  Score 5 or greater  Results sent to PCP

## 2015-05-07 NOTE — Progress Notes (Signed)
NPO AFTER MN WITH EXCEPTION CLEAR LIQUIDS UNTIL 0730 (NO CREAM/ MILK PRODUCTS).  ARRIVE AT 1200. NEEDS ISTAT AND EKG.  WILL TAKE FLOMAX AM DOS W/ SIPS OF WATER AND IF NEEDED TAKE HYDROCODONE/ ZOFRAN.  PT HAS INSULIN PUMP.

## 2015-05-12 ENCOUNTER — Ambulatory Visit (HOSPITAL_BASED_OUTPATIENT_CLINIC_OR_DEPARTMENT_OTHER): Payer: BC Managed Care – PPO | Admitting: Anesthesiology

## 2015-05-12 ENCOUNTER — Encounter (HOSPITAL_BASED_OUTPATIENT_CLINIC_OR_DEPARTMENT_OTHER): Payer: Self-pay | Admitting: Anesthesiology

## 2015-05-12 ENCOUNTER — Ambulatory Visit (HOSPITAL_BASED_OUTPATIENT_CLINIC_OR_DEPARTMENT_OTHER)
Admission: RE | Admit: 2015-05-12 | Discharge: 2015-05-12 | Disposition: A | Discharge status: Home / Self Care | Payer: BC Managed Care – PPO | Source: Ambulatory Visit | Attending: Urology | Admitting: Urology

## 2015-05-12 ENCOUNTER — Encounter (HOSPITAL_BASED_OUTPATIENT_CLINIC_OR_DEPARTMENT_OTHER)
Admission: RE | Disposition: A | Discharge status: Home / Self Care | Payer: Self-pay | Source: Ambulatory Visit | Attending: Urology

## 2015-05-12 ENCOUNTER — Other Ambulatory Visit: Payer: Self-pay

## 2015-05-12 DIAGNOSIS — Z87442 Personal history of urinary calculi: Secondary | ICD-10-CM | POA: Diagnosis not present

## 2015-05-12 DIAGNOSIS — E785 Hyperlipidemia, unspecified: Secondary | ICD-10-CM | POA: Diagnosis not present

## 2015-05-12 DIAGNOSIS — N201 Calculus of ureter: Secondary | ICD-10-CM | POA: Diagnosis present

## 2015-05-12 DIAGNOSIS — N200 Calculus of kidney: Secondary | ICD-10-CM | POA: Diagnosis not present

## 2015-05-12 DIAGNOSIS — E119 Type 2 diabetes mellitus without complications: Secondary | ICD-10-CM | POA: Insufficient documentation

## 2015-05-12 DIAGNOSIS — D72829 Elevated white blood cell count, unspecified: Secondary | ICD-10-CM | POA: Diagnosis not present

## 2015-05-12 DIAGNOSIS — Z794 Long term (current) use of insulin: Secondary | ICD-10-CM | POA: Insufficient documentation

## 2015-05-12 DIAGNOSIS — F17210 Nicotine dependence, cigarettes, uncomplicated: Secondary | ICD-10-CM | POA: Insufficient documentation

## 2015-05-12 DIAGNOSIS — I10 Essential (primary) hypertension: Secondary | ICD-10-CM | POA: Diagnosis not present

## 2015-05-12 DIAGNOSIS — Z79899 Other long term (current) drug therapy: Secondary | ICD-10-CM | POA: Diagnosis not present

## 2015-05-12 HISTORY — PX: CYSTOSCOPY W/ URETERAL STENT PLACEMENT: SHX1429

## 2015-05-12 HISTORY — PX: HOLMIUM LASER APPLICATION: SHX5852

## 2015-05-12 HISTORY — DX: Calculus of kidney: N20.0

## 2015-05-12 HISTORY — PX: CYSTOSCOPY/RETROGRADE/URETEROSCOPY/STONE EXTRACTION WITH BASKET: SHX5317

## 2015-05-12 HISTORY — DX: Personal history of urinary calculi: Z87.442

## 2015-05-12 HISTORY — DX: Personal history of malignant neoplasm of bladder: Z85.51

## 2015-05-12 HISTORY — DX: Other specified diabetes mellitus without complications: E13.9

## 2015-05-12 LAB — POCT I-STAT, CHEM 8
BUN: 12 mg/dL (ref 6–20)
Calcium, Ion: 1.21 mmol/L (ref 1.13–1.30)
Chloride: 104 mmol/L (ref 101–111)
Creatinine, Ser: 1 mg/dL (ref 0.61–1.24)
Glucose, Bld: 113 mg/dL — ABNORMAL HIGH (ref 65–99)
HCT: 50 % (ref 39.0–52.0)
Hemoglobin: 17 g/dL (ref 13.0–17.0)
Potassium: 4.3 mmol/L (ref 3.5–5.1)
Sodium: 140 mmol/L (ref 135–145)
TCO2: 25 mmol/L (ref 0–100)

## 2015-05-12 LAB — GLUCOSE, CAPILLARY: Glucose-Capillary: 156 mg/dL — ABNORMAL HIGH (ref 65–99)

## 2015-05-12 SURGERY — CYSTOSCOPY, WITH CALCULUS REMOVAL USING BASKET
Anesthesia: General | Laterality: Right

## 2015-05-12 MED ORDER — LACTATED RINGERS IV SOLN
INTRAVENOUS | Status: DC
  Administered 2015-05-12: 13:00:00 via INTRAVENOUS
  Filled 2015-05-12: qty 1000

## 2015-05-12 MED ORDER — FENTANYL CITRATE (PF) 100 MCG/2ML IJ SOLN
25.0000 ug | INTRAMUSCULAR | Status: DC | PRN
  Filled 2015-05-12: qty 1

## 2015-05-12 MED ORDER — DEXTROSE 5 % IV SOLN
1.0000 g | INTRAVENOUS | Status: DC
  Filled 2015-05-12: qty 10

## 2015-05-12 MED ORDER — ACETAMINOPHEN 10 MG/ML IV SOLN
INTRAVENOUS | Status: DC | PRN
  Administered 2015-05-12: 1000 mg via INTRAVENOUS

## 2015-05-12 MED ORDER — ONDANSETRON HCL 4 MG/2ML IJ SOLN
INTRAMUSCULAR | Status: DC | PRN
  Administered 2015-05-12: 4 mg via INTRAVENOUS

## 2015-05-12 MED ORDER — OXYCODONE HCL 5 MG PO TABS
5.0000 mg | ORAL_TABLET | Freq: Once | ORAL | Status: AC
  Administered 2015-05-12: 5 mg via ORAL
  Filled 2015-05-12: qty 1

## 2015-05-12 MED ORDER — MIDAZOLAM HCL 5 MG/5ML IJ SOLN
INTRAMUSCULAR | Status: DC | PRN
  Administered 2015-05-12 (×2): 1 mg via INTRAVENOUS

## 2015-05-12 MED ORDER — SULFAMETHOXAZOLE-TRIMETHOPRIM 800-160 MG PO TABS: 1.0000 | ORAL_TABLET | Freq: Two times a day (BID) | ORAL | Status: DC

## 2015-05-12 MED ORDER — HYDROCODONE-ACETAMINOPHEN 10-325 MG PO TABS: 1.0000 | ORAL_TABLET | Freq: Four times a day (QID) | ORAL | Status: DC | PRN

## 2015-05-12 MED ORDER — FENTANYL CITRATE (PF) 100 MCG/2ML IJ SOLN
INTRAMUSCULAR | Status: DC | PRN
  Administered 2015-05-12 (×11): 12.5 ug via INTRAVENOUS
  Administered 2015-05-12 (×2): 25 ug via INTRAVENOUS
  Administered 2015-05-12: 12.5 ug via INTRAVENOUS

## 2015-05-12 MED ORDER — LIDOCAINE HCL (CARDIAC) 20 MG/ML IV SOLN
INTRAVENOUS | Status: DC | PRN
  Administered 2015-05-12: 60 mg via INTRAVENOUS

## 2015-05-12 MED ORDER — MEPERIDINE HCL 25 MG/ML IJ SOLN
6.2500 mg | INTRAMUSCULAR | Status: DC | PRN
  Filled 2015-05-12: qty 1

## 2015-05-12 MED ORDER — PROMETHAZINE HCL 25 MG/ML IJ SOLN
6.2500 mg | INTRAMUSCULAR | Status: DC | PRN
  Filled 2015-05-12: qty 1

## 2015-05-12 MED ORDER — KETOROLAC TROMETHAMINE 30 MG/ML IJ SOLN
INTRAMUSCULAR | Status: DC | PRN
  Administered 2015-05-12: 30 mg via INTRAVENOUS

## 2015-05-12 MED ORDER — CEFTRIAXONE SODIUM 2 G IJ SOLR
INTRAMUSCULAR | Status: AC
  Filled 2015-05-12: qty 2

## 2015-05-12 MED ORDER — FENTANYL CITRATE (PF) 100 MCG/2ML IJ SOLN
INTRAMUSCULAR | Status: AC
  Filled 2015-05-12: qty 4

## 2015-05-12 MED ORDER — IOHEXOL 350 MG/ML SOLN
INTRAVENOUS | Status: DC | PRN
  Administered 2015-05-12: 3 mL via URETHRAL

## 2015-05-12 MED ORDER — LACTATED RINGERS IV SOLN
INTRAVENOUS | Status: DC
  Filled 2015-05-12: qty 1000

## 2015-05-12 MED ORDER — OXYCODONE HCL 5 MG PO TABS
ORAL_TABLET | ORAL | Status: AC
Start: 2015-05-12 — End: 2015-05-12
  Filled 2015-05-12: qty 1

## 2015-05-12 MED ORDER — DEXTROSE 5 % IV SOLN
INTRAVENOUS | Status: AC
  Filled 2015-05-12: qty 50

## 2015-05-12 MED ORDER — DEXTROSE 5 % IV SOLN
2.0000 g | INTRAVENOUS | Status: AC
  Administered 2015-05-12: 2 g via INTRAVENOUS
  Filled 2015-05-12: qty 2

## 2015-05-12 MED ORDER — MIDAZOLAM HCL 2 MG/2ML IJ SOLN
INTRAMUSCULAR | Status: AC
  Filled 2015-05-12: qty 2

## 2015-05-12 MED ORDER — PROPOFOL 10 MG/ML IV BOLUS
INTRAVENOUS | Status: DC | PRN
  Administered 2015-05-12: 200 mg via INTRAVENOUS

## 2015-05-12 SURGICAL SUPPLY — 24 items
BAG URO CATCHER STRL LF (DRAPE) ×2 IMPLANT
BASKET LASER NITINOL 1.9FR (BASKET) IMPLANT
BASKET STONE 1.7 NGAGE (UROLOGICAL SUPPLIES) ×2 IMPLANT
BASKET ZERO TIP NITINOL 2.4FR (BASKET) IMPLANT
CATH INTERMIT  6FR 70CM (CATHETERS) ×2 IMPLANT
CLOTH BEACON ORANGE TIMEOUT ST (SAFETY) ×2 IMPLANT
FIBER LASER FLEXIVA 365 (UROLOGICAL SUPPLIES) IMPLANT
FIBER LASER TRAC TIP (UROLOGICAL SUPPLIES) ×2 IMPLANT
GLOVE BIO SURGEON STRL SZ8 (GLOVE) ×2 IMPLANT
GOWN STRL REUS W/ TWL LRG LVL3 (GOWN DISPOSABLE) ×1 IMPLANT
GOWN STRL REUS W/ TWL XL LVL3 (GOWN DISPOSABLE) ×1 IMPLANT
GOWN STRL REUS W/TWL LRG LVL3 (GOWN DISPOSABLE) ×1
GOWN STRL REUS W/TWL XL LVL3 (GOWN DISPOSABLE) ×1
GUIDEWIRE ANG ZIPWIRE 038X150 (WIRE) ×2 IMPLANT
GUIDEWIRE STR DUAL SENSOR (WIRE) ×2 IMPLANT
IV NS IRRIG 3000ML ARTHROMATIC (IV SOLUTION) ×4 IMPLANT
KIT ROOM TURNOVER WOR (KITS) ×2 IMPLANT
MANIFOLD NEPTUNE II (INSTRUMENTS) ×2 IMPLANT
PACK CYSTO (CUSTOM PROCEDURE TRAY) ×2 IMPLANT
SHEATH ACCESS URETERAL 38CM (SHEATH) ×2 IMPLANT
STENT URET 6FRX26 CONTOUR (STENTS) ×2 IMPLANT
SYRINGE 10CC LL (SYRINGE) ×2 IMPLANT
TUBE CONNECTING 12X1/4 (SUCTIONS) ×2 IMPLANT
TUBE FEEDING 8FR 16IN STR KANG (MISCELLANEOUS) IMPLANT

## 2015-05-12 NOTE — Transfer of Care (Signed)
Immediate Anesthesia Transfer of Care Note  Patient: Anthony Daniel.  Procedure(s) Performed: Procedure(s) (LRB): CYSTOSCOPY/RETROGRADE/URETEROSCOPY/STONE EXTRACTION WITH BASKET (Right) CYSTOSCOPY WITH STENT REPLACEMENT (Right) HOLMIUM LASER APPLICATION (Right)  Patient Location: PACU  Anesthesia Type: General  Level of Consciousness: awake, sedated, patient cooperative and responds to stimulation  Airway & Oxygen Therapy: Patient Spontanous Breathing and Patient connected to face mask oxygen  Post-op Assessment: Report given to PACU RN, Post -op Vital signs reviewed and stable and Patient moving all extremities  Post vital signs: Reviewed and stable  Complications: No apparent anesthesia complications

## 2015-05-12 NOTE — Anesthesia Postprocedure Evaluation (Signed)
  Anesthesia Post-op Note  Patient: Anthony Daniel.  Procedure(s) Performed: Procedure(s) (LRB): CYSTOSCOPY/RETROGRADE/URETEROSCOPY/STONE EXTRACTION WITH BASKET (Right) CYSTOSCOPY WITH STENT REPLACEMENT (Right) HOLMIUM LASER APPLICATION (Right)  Patient Location: PACU  Anesthesia Type: General  Level of Consciousness: awake and alert   Airway and Oxygen Therapy: Patient Spontanous Breathing  Post-op Pain: mild  Post-op Assessment: Post-op Vital signs reviewed, Patient's Cardiovascular Status Stable, Respiratory Function Stable, Patent Airway and No signs of Nausea or vomiting  Last Vitals:  Filed Vitals:   05/12/15 1630  BP: 135/85  Pulse: 60  Temp:   Resp: 16    Post-op Vital Signs: stable   Complications: No apparent anesthesia complications

## 2015-05-12 NOTE — Interval H&P Note (Signed)
History and Physical Interval Note:  05/12/2015 1:35 PM  Anthony Daniel.  has presented today for surgery, with the diagnosis of RIGHT URETERAL STONE  The various methods of treatment have been discussed with the patient and family. After consideration of risks, benefits and other options for treatment, the patient has consented to  Procedure(s): CYSTOSCOPY/RETROGRADE/URETEROSCOPY/STONE EXTRACTION WITH BASKET (Right) CYSTOSCOPY WITH STENT REPLACEMENT (Right) HOLMIUM LASER APPLICATION (Right) as a surgical intervention .  The patient's history has been reviewed, patient examined, no change in status, stable for surgery.  I have reviewed the patient's chart and labs.  Questions were answered to the patient's satisfaction.     Zadkiel Dragan L

## 2015-05-12 NOTE — Anesthesia Preprocedure Evaluation (Signed)
Anesthesia Evaluation  Patient identified by MRN, date of birth, ID band Patient awake    Reviewed: Allergy & Precautions, H&P , NPO status , Patient's Chart, lab work & pertinent test results, reviewed documented beta blocker date and time   History of Anesthesia Complications Negative for: history of anesthetic complications  Airway Mallampati: II  TM Distance: >3 FB Neck ROM: Full    Dental  (+) Teeth Intact, Dental Advisory Given   Pulmonary Current Smoker,    breath sounds clear to auscultation       Cardiovascular hypertension, Pt. on medications  Rhythm:Regular Rate:Normal     Neuro/Psych negative neurological ROS  negative psych ROS   GI/Hepatic negative GI ROS, Neg liver ROS,   Endo/Other  diabetes (insulin pump at 50% regular rate), Well Controlled, Type 1, Insulin Dependent  Renal/GU negative Renal ROS     Musculoskeletal negative musculoskeletal ROS (+)   Abdominal   Peds  Hematology negative hematology ROS (+)   Anesthesia Other Findings   Reproductive/Obstetrics negative OB ROS                             Anesthesia Physical  Anesthesia Plan  ASA: III  Anesthesia Plan: General   Post-op Pain Management:    Induction: Intravenous  Airway Management Planned: LMA  Additional Equipment:   Intra-op Plan:   Post-operative Plan: Extubation in OR  Informed Consent:   Plan Discussed with: CRNA and Surgeon  Anesthesia Plan Comments:         Anesthesia Quick Evaluation

## 2015-05-12 NOTE — Brief Op Note (Signed)
05/12/2015  3:33 PM  PATIENT:  Anthony Daniel.  64 y.o. male  PRE-OPERATIVE DIAGNOSIS:  RIGHT URETERAL STONE  POST-OPERATIVE DIAGNOSIS:  RIGHT URETERAL STONE  PROCEDURE:  Procedure(s): CYSTOSCOPY/RETROGRADE/URETEROSCOPY/STONE EXTRACTION WITH BASKET (Right) CYSTOSCOPY WITH STENT REPLACEMENT (Right) HOLMIUM LASER APPLICATION (Right)  SURGEON:  Surgeon(s) and Role:    * Cleon Gustin, MD - Primary  PHYSICIAN ASSISTANT:   ASSISTANTS: none   ANESTHESIA:   general  EBL:  Total I/O In: 100 [I.V.:100] Out: -   BLOOD ADMINISTERED:none  DRAINS: right 6x26 JJ stent with tether  LOCAL MEDICATIONS USED:  NONE  SPECIMEN:  Source of Specimen:  stone  DISPOSITION OF SPECIMEN:  N/A  COUNTS:  YES  TOURNIQUET:  * No tourniquets in log *  DICTATION: .Note written in EPIC  PLAN OF CARE: Discharge to home after PACU  PATIENT DISPOSITION:  PACU - hemodynamically stable.   Delay start of Pharmacological VTE agent (>24hrs) due to surgical blood loss or risk of bleeding: not applicable

## 2015-05-12 NOTE — Anesthesia Procedure Notes (Signed)
Procedure Name: LMA Insertion Date/Time: 05/12/2015 2:35 PM Performed by: Justice Rocher Pre-anesthesia Checklist: Patient identified, Emergency Drugs available, Suction available and Patient being monitored Patient Re-evaluated:Patient Re-evaluated prior to inductionOxygen Delivery Method: Circle System Utilized Preoxygenation: Pre-oxygenation with 100% oxygen Intubation Type: IV induction Ventilation: Mask ventilation without difficulty LMA: LMA inserted LMA Size: 5.0 Number of attempts: 1 Airway Equipment and Method: Bite block Placement Confirmation: positive ETCO2 Tube secured with: Tape Dental Injury: Teeth and Oropharynx as per pre-operative assessment

## 2015-05-12 NOTE — H&P (View-Only) (Signed)
Urology Consult  Referring physician: Carmin Muskrat Reason for referral: ureteral stone  Chief Complaint: right flank pain  History of Present Illness: Anthony Daniel is a 64yo with a 24 hour hx severe sharp, intermittent right flank pain. Ct scan shows a 1cm right proximal ureteral stone with moderate hydro and forniceal rupture. WBC count is 18.9. He has had multiple stone events in the past. Of note he also has multiple left lower pole calculi. He has associated nausea and vomiting. He denies any fevers.   Past Medical History  Diagnosis Date  . Diabetes mellitus without complication ([place])   . Hyperlipidemia   . Hypertension   . Cancer [facility] 1996    bladder cancer  . Stones in the urinary tract    Past Surgical History  Procedure Laterality Date  . Tonsillectomy  1975  . Bladder surgery    . Appendectomy  11/07/2012    Dr Ninfa Linden  . Laparoscopic appendectomy N/A 11/07/2012    Procedure: APPENDECTOMY LAPAROSCOPIC;  Surgeon: Harl Bowie, MD;  Location: [place];  Service: General;  Laterality: N/A;    Medications: I have reviewed the patient's current medications. Allergies:  Allergies  Allergen Reactions  . Crestor [Rosuvastatin] Other (See Comments)    Muscle aches  . Lipitor [Atorvastatin]     Muscle aches  . Contrast Media [Iodinated Diagnostic Agents] Nausea And Vomiting    PT TRIED THIS TWICE.EVEN WITH PRE-MEDS AND STILL HAD NAUSEA AND ITCHING--WAS TOLD HE CAN NOT TAKE iv ANYMORE  . Latex Other (See Comments)    Irritation  . Adhesive [Tape] Rash  . Peanut-Containing Drug Products Anxiety and Other (See Comments)    Jittery    Family History  Problem Relation Age of Onset  . Cancer Other     Colon and Prostate Cancer  . Hypertension Other   . Diabetes Other   . Alcohol abuse Neg Hx   . Drug abuse Neg Hx   . Early death Neg Hx   . Heart disease Neg Hx   . Hyperlipidemia Neg Hx   . Kidney disease Neg Hx   . Stroke Neg Hx    Social History:  reports that  he has been smoking Cigarettes.  He has a 30 pack-year smoking history. He has never used smokeless tobacco. He reports that he drinks about 3.0 oz of alcohol per week. He reports that he does not use illicit drugs.  Review of Systems  Gastrointestinal: Positive for nausea and vomiting.  Genitourinary: Positive for flank pain.  All other systems reviewed and are negative.   Physical Exam:  Vital signs in last 24 hours: Temp:  [98.3 F (36.8 C)-98.9 F (37.2 C)] 98.3 F (36.8 C) (10/06 1145) Pulse Rate:  [60-70] 60 (10/06 1145) Resp:  [17-18] 18 (10/06 1145) BP: (144-165)/(62-71) 144/65 mmHg (10/06 1145) SpO2:  [98 %-100 %] 98 % (10/06 1145) Weight:  [113.9 kg (251 lb 1.7 oz)] 113.9 kg (251 lb 1.7 oz) (10/06 1145) Physical Exam  Constitutional: He is oriented to person, place, and time. He appears well-developed and well-nourished.  HENT:  Head: Normocephalic and atraumatic.  Eyes: EOM are normal. Pupils are equal, round, and reactive to light.  Neck: Normal range of motion. No thyromegaly present.  Cardiovascular: Normal rate and regular rhythm.   Respiratory: Effort normal. No respiratory distress.  GI: Soft. He exhibits no distension.  Genitourinary: Penis normal. No penile tenderness.  Musculoskeletal: Normal range of motion. He exhibits no edema.  Neurological: He is alert  and oriented to person, place, and time.  Skin: Skin is warm and dry.  Psychiatric: He has a normal mood and affect. His behavior is normal. Judgment and thought content normal.    Laboratory Data:  Results for orders placed or performed during the hospital encounter of 04/23/15 (from the past 72 hour(s))  CBC with Differential     Status: Abnormal   Collection Time: 04/23/15  7:54 AM  Result Value Ref Range   WBC 18.7 (H) 4.0 - 10.5 K/uL   RBC 5.29 4.22 - 5.81 MIL/uL   Hemoglobin 15.8 13.0 - 17.0 g/dL   HCT 47.5 39.0 - 52.0 %   MCV 89.8 78.0 - 100.0 fL   MCH 29.9 26.0 - 34.0 pg   MCHC 33.3 30.0 -  36.0 g/dL   RDW 15.5 11.5 - 15.5 %   Platelets 163 150 - 400 K/uL   Neutrophils Relative % 76 %   Neutro Abs 14.3 (H) 1.7 - 7.7 K/uL   Lymphocytes Relative 17 %   Lymphs Abs 3.1 0.7 - 4.0 K/uL   Monocytes Relative 6 %   Monocytes Absolute 1.2 (H) 0.1 - 1.0 K/uL   Eosinophils Relative 1 %   Eosinophils Absolute 0.1 0.0 - 0.7 K/uL   Basophils Relative 0 %   Basophils Absolute 0.0 0.0 - 0.1 K/uL  Comprehensive metabolic panel     Status: Abnormal   Collection Time: 04/23/15  7:54 AM  Result Value Ref Range   Sodium 135 135 - 145 mmol/L   Potassium 4.5 3.5 - 5.1 mmol/L   Chloride 103 101 - 111 mmol/L   CO2 25 22 - 32 mmol/L   Glucose, Bld 167 (H) 65 - 99 mg/dL   BUN 24 (H) 6 - 20 mg/dL   Creatinine, Ser 1.98 (H) 0.61 - 1.24 mg/dL   Calcium 8.5 (L) 8.9 - 10.3 mg/dL   Total Protein 7.1 6.5 - 8.1 g/dL   Albumin 4.0 3.5 - 5.0 g/dL   AST 23 15 - 41 U/L   ALT 22 17 - 63 U/L   Alkaline Phosphatase 90 38 - 126 U/L   Total Bilirubin 0.7 0.3 - 1.2 mg/dL   GFR calc non Af Amer 34 (L) >60 mL/min   GFR calc Af Amer 40 (L) >60 mL/min    Comment: (NOTE) The eGFR has been calculated using the CKD EPI equation. This calculation has not been validated in all clinical situations. eGFR's persistently <60 mL/min signify possible Chronic Kidney Disease.    Anion gap 7 5 - 15  Urinalysis, Routine w reflex microscopic     Status: Abnormal   Collection Time: 04/23/15  9:15 AM  Result Value Ref Range   Color, Urine YELLOW YELLOW   APPearance CLEAR CLEAR   Specific Gravity, Urine 1.035 (H) 1.005 - 1.030   pH 5.0 5.0 - 8.0   Glucose, UA >1000 (A) NEGATIVE mg/dL   Hgb urine dipstick NEGATIVE NEGATIVE   Bilirubin Urine NEGATIVE NEGATIVE   Ketones, ur 15 (A) NEGATIVE mg/dL   Protein, ur NEGATIVE NEGATIVE mg/dL   Urobilinogen, UA 0.2 0.0 - 1.0 mg/dL   Nitrite NEGATIVE NEGATIVE   Leukocytes, UA NEGATIVE NEGATIVE  Urine microscopic-add on     Status: None   Collection Time: 04/23/15  9:15 AM   Result Value Ref Range   WBC, UA 0-2 <3 WBC/hpf   RBC / HPF 0-2 <3 RBC/hpf  Surgical pcr screen     Status: None   Collection Time:  04/23/15 12:54 PM  Result Value Ref Range   MRSA, PCR NEGATIVE NEGATIVE   Staphylococcus aureus NEGATIVE NEGATIVE    Comment:        The Xpert SA Assay (FDA approved for NASAL specimens in patients over 60 years of age), is one component of a comprehensive surveillance program.  Test performance has been validated by [facility] for patients greater than or equal to 34 year old. It is not intended to diagnose infection nor to guide or monitor treatment.    Recent Results (from the past 240 hour(s))  Surgical pcr screen     Status: None   Collection Time: 04/23/15 12:54 PM  Result Value Ref Range Status   MRSA, PCR NEGATIVE NEGATIVE Final   Staphylococcus aureus NEGATIVE NEGATIVE Final    Comment:        The Xpert SA Assay (FDA approved for NASAL specimens in patients over 52 years of age), is one component of a comprehensive surveillance program.  Test performance has been validated by [facility] for patients greater than or equal to 27 year old. It is not intended to diagnose infection nor to guide or monitor treatment.    Creatinine:  Recent Labs  04/23/15 0754  CREATININE 1.98*   Baseline Creatinine: unknown  Impression/Assessment:  64yo with right ureteral calculi, leukocytosis  Plan:  1. The risks/benefits/alternatives to right ureteral stent placement was explained to the patient and he understands and wishes to proceed with surgery. 2. Pt will be admitted overnight for observation and IV antibiotics  Gary Bultman L 04/23/2015, 4:42 PM

## 2015-05-12 NOTE — Discharge Instructions (Signed)
Remove stent in 72 hours by pulling string    [facility] [phone] Post Ureteroscopy With or Without Stent Instructions  Definitions:  Ureter: The duct that transports urine from the kidney to the bladder. Stent:   A plastic hollow tube that is placed into the ureter, from the kidney to the bladder to prevent the ureter from swelling shut.  GENERAL INSTRUCTIONS:  Despite the fact that no skin incisions were used, the area around the ureter and bladder is raw and irritated. The stent is a foreign body which will further irritate the bladder wall. This irritation is manifested by increased frequency of urination, both day and night, and by an increase in the urge to urinate. In some, the urge to urinate is present almost always. Sometimes the urge is strong enough that you may not be able to stop yourself from urinating. The only real cure is to remove the stent and then give time for the bladder wall to heal which can't be done until the danger of the ureter swelling shut has passed, which varies.  You may see some blood in your urine while the stent is in place and a few days afterwards. Do not be alarmed, even if the urine was clear for a while. Get off your feet and drink lots of fluids until clearing occurs. If you start to pass clots or don't improve, call us.  DIET: You may return to your normal diet immediately. Because of the raw surface of your bladder, alcohol, spicy foods, acid type foods and drinks with caffeine may cause irritation or frequency and should be used in moderation. To keep your urine flowing freely and to avoid constipation, drink plenty of fluids during the day ( 8-10 glasses ). Tip: Avoid cranberry juice because it is very acidic.  ACTIVITY: Your physical activity doesn't need to be restricted. However, if you are very active, you may see some blood in your urine. We suggest that you reduce your activity under these circumstances until the  bleeding has stopped.  BOWELS: It is important to keep your bowels regular during the postoperative period. Straining with bowel movements can cause bleeding. A bowel movement every other day is reasonable. Use a mild laxative if needed, such as Milk of Magnesia 2-3 tablespoons, or 2 Dulcolax tablets. Call if you continue to have problems. If you have been taking narcotics for pain, before, during or after your surgery, you may be constipated. Take a laxative if necessary.   MEDICATION: You should resume your pre-surgery medications unless told not to. In addition you will often be given an antibiotic to prevent infection. These should be taken as prescribed until the bottles are finished unless you are having an unusual reaction to one of the drugs.  PROBLEMS YOU SHOULD REPORT TO [place]:  Fevers over 100.5 Fahrenheit.  Heavy bleeding, or clots ( See above notes about blood in urine ).  Inability to urinate.  Drug reactions ( hives, rash, nausea, vomiting, diarrhea ).  Severe burning or pain with urination that is not improving.  FOLLOW-UP: You will need a follow-up appointment to monitor your progress. Call for this appointment at the number listed above. Usually the first appointment will be about three to fourteen days after your surgery.     Post Anesthesia Home Care Instructions  Activity: Get plenty of rest for the remainder of the day. A responsible adult should stay with you for 24 hours following the procedure.  For the next 24 hours, DO  NOT: -Drive a car Film/video editor -Drink alcoholic beverages -Take any medication unless instructed by your physician -Make any legal decisions or sign important papers.  Meals: Start with liquid foods such as gelatin or soup. Progress to regular foods as tolerated. Avoid greasy, spicy, heavy foods. If nausea and/or vomiting occur, drink only clear liquids until the nausea and/or vomiting subsides. Call your physician if vomiting  continues.  Special Instructions/Symptoms: Your throat may feel dry or sore from the anesthesia or the breathing tube placed in your throat during surgery. If this causes discomfort, gargle with warm salt water. The discomfort should disappear within 24 hours.  If you had a scopolamine patch placed behind your ear for the management of post- operative nausea and/or vomiting:  1. The medication in the patch is effective for 72 hours, after which it should be removed.  Wrap patch in a tissue and discard in the trash. Wash hands thoroughly with soap and water. 2. You may remove the patch earlier than 72 hours if you experience unpleasant side effects which may include dry mouth, dizziness or visual disturbances. 3. Avoid touching the patch. Wash your hands with soap and water after contact with the patch.

## 2015-05-13 ENCOUNTER — Encounter (HOSPITAL_BASED_OUTPATIENT_CLINIC_OR_DEPARTMENT_OTHER): Payer: Self-pay | Admitting: Urology

## 2015-05-13 NOTE — Op Note (Signed)
Preoperative diagnosis: Right renal stone  Postoperative diagnosis: Same  Procedure: 1 cystoscopy 2 right retrograde pyelography 3.  Intraoperative fluoroscopy, under one hour, with interpretation 4.  Right ureteroscopic stone manipulation with laser lithotripsy 5.  Right 6 x 26 JJ stent  exchnage  Attending: Rosie Fate  Anesthesia: General  Estimated blood loss: None  Drains: Right 6 x 26 JJ ureteral stent with tether  Specimens: stone for analysis  Antibiotics: rocephin  Findings: Right UPJ stone and multiple lower pole stones. No hydronephrosis. No masses/lesions in the bladder. Ureteral orifices in normal anatomic location.  Indications: Patient is a 64 year old male with a history of right renal stone and who has persistent right flank pain.  He had a stent placed for sepsis 2 weeks ago. After discussing treatment options, he decided proceed with right ureteroscopic stone manipulation.  Procedure her in detail: The patient was brought to the operating room and a brief timeout was done to ensure correct patient, correct procedure, correct site.  General anesthesia was administered patient was placed in dorsal lithotomy position.  Her genitalia was then prepped and draped in usual sterile fashion.  A rigid 35 French cystoscope was passed in the urethra and the bladder.  Bladder was inspected free masses or lesions.  the right ureteral orifices were in the normal orthotopic locations.  Using a grasper the stent was brought to the urethral meatus. A zip wire was then advanced through the stent and up to the renal pelvis. The stent was then removed. a 6 french ureteral catheter was then instilled into the right ureter orifice.  a gentle retrograde was obtained and findings noted above.  we then removed the cystoscope and cannulated the right ureteral orifice with a semirigid ureteroscope.  No stone was found in the ureter. Once we reached the UPJ a sensor wire was advanced in to the  renal pelvis. Over the wire a 12/14/x 38cm access sheath was advanced up to the UPJ.  We then used a flexible ureteroscope to perform nephroscopy. We encountered the stones in the renal pelvis/UPJ, and lower pole.  using using a 200 nm laser fiber and fragmented the stone into smaller pieces.  the pieces were then removed with a Ngage basket.   We then removed the access sheath under direct vision and noted no injury to the ureter. we then placed a 6 x 26 double-j ureteral stent over the original zip wire.We removed the wire and good coil was noted in the the renal pelvis under fluoroscopy and the bladder under direct vision.     the bladder was then drained and this concluded the procedure which was well tolerated by patient.  Complications: None  Condition: Stable, extubated, transferred to PACU  Plan: Patient is to be discharged home as to follow-up in one week. He is to remove his stent in 3 days by pulling the tether

## 2015-05-19 LAB — PSA: PSA: 3.61

## 2015-05-22 ENCOUNTER — Other Ambulatory Visit: Payer: Self-pay | Admitting: Urology

## 2015-05-28 ENCOUNTER — Other Ambulatory Visit: Payer: Self-pay | Admitting: Urology

## 2015-06-01 ENCOUNTER — Other Ambulatory Visit: Payer: Self-pay | Admitting: Urology

## 2015-06-05 ENCOUNTER — Other Ambulatory Visit: Payer: Self-pay

## 2015-06-05 MED ORDER — DAPAGLIFLOZIN PROPANEDIOL 10 MG PO TABS: ORAL_TABLET | ORAL | Status: DC

## 2015-06-22 NOTE — Patient Instructions (Addendum)
YOUR PROCEDURE IS SCHEDULED ON :  06/29/15  REPORT TO [facility] MAIN ENTRANCE FOLLOW SIGNS TO EAST ELEVATOR - GO TO 3rd FLOOR CHECK IN AT 3 EAST NURSES STATION (SHORT STAY) AT:  7:30 AM  CALL THIS NUMBER IF YOU HAVE PROBLEMS THE MORNING OF SURGERY [phone]  REMEMBER:ONLY 1 PER PERSON MAY GO TO SHORT STAY WITH YOU TO GET READY THE MORNING OF YOUR SURGERY  DO NOT EAT FOOD OR DRINK LIQUIDS AFTER MIDNIGHT  TAKE THESE MEDICINES THE MORNING OF SURGERY: LIVALO / ZETIA / TAMSULOSIN  LEAVE INSULIN PUMP ON  / BRING PUMP EQUIPMENT TO HOSPITAL  YOU MAY NOT HAVE ANY METAL ON YOUR BODY INCLUDING HAIR PINS AND PIERCING'S. DO NOT WEAR JEWELRY, MAKEUP, LOTIONS, POWDERS OR PERFUMES. DO NOT WEAR NAIL POLISH. DO NOT SHAVE 48 HRS PRIOR TO SURGERY. MEN MAY SHAVE FACE AND NECK.  DO NOT [place]. [facility] IS NOT RESPONSIBLE FOR VALUABLES.  CONTACTS, DENTURES OR PARTIALS MAY NOT BE WORN TO SURGERY. LEAVE SUITCASE IN CAR. CAN BE BROUGHT TO ROOM AFTER SURGERY.  PATIENTS DISCHARGED THE DAY OF SURGERY WILL NOT BE ALLOWED TO DRIVE HOME.  PLEASE READ OVER THE FOLLOWING INSTRUCTION SHEETS _________________________________________________________________________________                                          [facility] - PREPARING FOR SURGERY  Before surgery, you can play an important role.  Because skin is not sterile, your skin needs to be as free of germs as possible.  You can reduce the number of germs on your skin by washing with CHG (chlorahexidine gluconate) soap before surgery.  CHG is an antiseptic cleaner which kills germs and bonds with the skin to continue killing germs even after washing. Please DO NOT use if you have an allergy to CHG or antibacterial soaps.  If your skin becomes reddened/irritated stop using the CHG and inform your nurse when you arrive at Short Stay. Do not shave (including legs and underarms) for at least 48 hours prior to the first  CHG shower.  You may shave your face. Please follow these instructions carefully:   1.  Shower with CHG Soap the night before surgery and the  morning of Surgery.   2.  If you choose to wash your hair, wash your hair first as usual with your  normal  Shampoo.   3.  After you shampoo, rinse your hair and body thoroughly to remove the  shampoo.                                         4.  Use CHG as you would any other liquid soap.  You can apply chg directly  to the skin and wash . Gently wash with scrungie or clean wascloth    5.  Apply the CHG Soap to your body ONLY FROM THE NECK DOWN.   Do not use on open                           Wound or open sores. Avoid contact with eyes, ears mouth and genitals (private parts).  Genitals (private parts) with your normal soap.              6.  Wash thoroughly, paying special attention to the area where your surgery  will be performed.   7.  Thoroughly rinse your body with warm water from the neck down.   8.  DO NOT shower/wash with your normal soap after using and rinsing off  the CHG Soap .                9.  Pat yourself dry with a clean towel.             10.  Wear clean night clothes to bed after shower             11.  Place clean sheets on your bed the night of your first shower and do not  sleep with pets.  Day of Surgery : Do not apply any lotions/deodorants the morning of surgery.  Please wear clean clothes to the hospital/surgery center.  FAILURE TO FOLLOW THESE INSTRUCTIONS MAY RESULT IN THE CANCELLATION OF YOUR SURGERY    PATIENT SIGNATURE_________________________________  ______________________________________________________________________

## 2015-06-23 ENCOUNTER — Encounter (HOSPITAL_COMMUNITY)
Admission: RE | Admit: 2015-06-23 | Discharge: 2015-06-23 | Disposition: A | Discharge status: Home / Self Care | Payer: BC Managed Care – PPO | Source: Ambulatory Visit | Attending: Urology | Admitting: Urology

## 2015-06-23 ENCOUNTER — Encounter (HOSPITAL_COMMUNITY): Payer: Self-pay

## 2015-06-23 ENCOUNTER — Other Ambulatory Visit (HOSPITAL_COMMUNITY): Payer: BC Managed Care – PPO

## 2015-06-23 DIAGNOSIS — Z01818 Encounter for other preprocedural examination: Secondary | ICD-10-CM | POA: Insufficient documentation

## 2015-06-23 LAB — CBC
HCT: 46.4 % (ref 39.0–52.0)
Hemoglobin: 15.5 g/dL (ref 13.0–17.0)
MCH: 29.9 pg (ref 26.0–34.0)
MCHC: 33.4 g/dL (ref 30.0–36.0)
MCV: 89.6 fL (ref 78.0–100.0)
Platelets: 169 10*3/uL (ref 150–400)
RBC: 5.18 MIL/uL (ref 4.22–5.81)
RDW: 15.2 % (ref 11.5–15.5)
WBC: 10.8 10*3/uL — ABNORMAL HIGH (ref 4.0–10.5)

## 2015-06-23 LAB — BASIC METABOLIC PANEL
Anion gap: 7 (ref 5–15)
BUN: 13 mg/dL (ref 6–20)
CO2: 27 mmol/L (ref 22–32)
Calcium: 9.1 mg/dL (ref 8.9–10.3)
Chloride: 106 mmol/L (ref 101–111)
Creatinine, Ser: 1 mg/dL (ref 0.61–1.24)
GFR calc Af Amer: >60 mL/min (ref >60)
GFR calc non Af Amer: >60 mL/min (ref >60)
Glucose, Bld: 133 mg/dL — ABNORMAL HIGH (ref 65–99)
Potassium: 4.8 mmol/L (ref 3.5–5.1)
Sodium: 140 mmol/L (ref 135–145)

## 2015-06-23 NOTE — Progress Notes (Signed)
I spoke with Gwen Pounds - diabetic coordinator - concerning pt's insulin pump - since surgery time is sched for 3 hrs, she recommends that pt be placed on insulin drip by anesthesia. Message left for Dr. Buddy Duty who manages pts. Diabetes and pump for any orders. Pt was instructed to leave pump attached until arriving at hospital and further information is obtained.

## 2015-06-23 NOTE — Progress Notes (Signed)
   06/23/15 1015  OBSTRUCTIVE SLEEP APNEA  Have you ever been diagnosed with sleep apnea through a sleep study? No  Do you snore loudly (loud enough to be heard through closed doors)?  1  Do you often feel tired, fatigued, or sleepy during the daytime (such as falling asleep during driving or talking to someone)? 0  Has anyone observed you stop breathing during your sleep? 0  Do you have, or are you being treated for high blood pressure? 1  BMI more than 35 kg/m2? 0  Age > 50 (1-yes) 1  Neck circumference greater than:Male 16 inches or larger, Male 17inches or larger? 1  Male Gender (Yes=1) 1  Obstructive Sleep Apnea Score 5  Score 5 or greater  Results sent to PCP

## 2015-06-24 ENCOUNTER — Other Ambulatory Visit: Payer: Self-pay | Admitting: Internal Medicine

## 2015-06-24 DIAGNOSIS — G4730 Sleep apnea, unspecified: Secondary | ICD-10-CM

## 2015-06-24 LAB — HEMOGLOBIN A1C
Hgb A1c MFr Bld: 7.2 % — ABNORMAL HIGH (ref 4.8–5.6)
Mean Plasma Glucose: 160 mg/dL

## 2015-06-26 NOTE — Progress Notes (Signed)
RECEIVED ORDERS FROM DR.KERR. PLACED ON CHART

## 2015-06-26 NOTE — Progress Notes (Signed)
I SPOKE WITH DR. CARIGNAN CONCERNING INSULIN PUMP - ANY ORDERS WILL BE HANDLED THE DAY OF SURGERY.

## 2015-06-29 ENCOUNTER — Ambulatory Visit (HOSPITAL_COMMUNITY): Admission: RE | Admit: 2015-06-29 | Payer: BC Managed Care – PPO | Source: Ambulatory Visit | Admitting: Urology

## 2015-06-29 ENCOUNTER — Ambulatory Visit (INDEPENDENT_AMBULATORY_CARE_PROVIDER_SITE_OTHER): Payer: BC Managed Care – PPO | Admitting: Family

## 2015-06-29 ENCOUNTER — Encounter (HOSPITAL_COMMUNITY): Admission: RE | Payer: Self-pay | Source: Ambulatory Visit

## 2015-06-29 ENCOUNTER — Encounter: Payer: Self-pay | Admitting: Family

## 2015-06-29 VITALS — BP 144/78 | HR 82 | Temp 99.0°F | Resp 18 | Ht 73.0 in | Wt 244.0 lb

## 2015-06-29 DIAGNOSIS — J069 Acute upper respiratory infection, unspecified: Secondary | ICD-10-CM

## 2015-06-29 SURGERY — NEPHROLITHOTOMY PERCUTANEOUS
Anesthesia: General | Laterality: Left

## 2015-06-29 MED ORDER — HYDROCODONE-HOMATROPINE 5-1.5 MG/5ML PO SYRP: 5.0000 mL | ORAL_SOLUTION | Freq: Three times a day (TID) | ORAL | Status: DC | PRN

## 2015-06-29 NOTE — Assessment & Plan Note (Signed)
Symptoms and exam consistent with acute upper respiratory infection. Treat conservatively at this time with over-the-counter medications as needed for symptom relief and supportive care. Start Hycodan as needed for cough and sleep. Follow-up if symptoms worsen or fail to improve for possible antibiotic therapy if needed.

## 2015-06-29 NOTE — Progress Notes (Signed)
Subjective:    [name]., male    DOB: 1951-03-02, 64 y.o.   MRN: [id number]  Chief Complaint  Patient presents with  . Cough    starting this morning, cough, fever, chills, sore throat, was supposed to have surgery today    HPI:  Anthony Daniel. is a 64 y.o. male who  has a past medical history of Hyperlipidemia; Hypertension; History of bladder cancer; Right ureteral stone; Nephrolithiasis; History of kidney stones; LADA (latent autoimmune diabetes in adults), managed as type 1 ([place]); Full dentures; and At risk for sleep apnea. and presents today for an acute office visit.   This is a new problem. Associated symptom of cough, fever, chills, and sore throat has been going on for about 1 day. He was scheduled to have kidney stone surgery, but had to be cancelled secondary the new onset of symptoms. Modifying factors include his wife's prescription cough syrup and 1 of a previous amoxicillin prescription which did help some. He has also used Coricidin which helped with the symptoms. T-max of 100.1 last night. Was recently seen for dental work and prescribed amoxicillin.   Allergies  Allergen Reactions  . Crestor [Rosuvastatin] Other (See Comments)    Muscle aches  . Lipitor [Atorvastatin] Other (See Comments)    Muscle aches  . Contrast Media [Iodinated Diagnostic Agents] Itching and Nausea And Vomiting    PT TRIED THIS TWICE.EVEN WITH PRE-MEDS AND STILL HAD NAUSEA AND ITCHING--WAS TOLD HE CAN NOT TAKE iv ANYMORE  . Latex Other (See Comments)    Irritation  . Adhesive [Tape] Rash  . Peanut-Containing Drug Products Anxiety and Other (See Comments)    Jittery     Current Outpatient Prescriptions on File Prior to Visit  Medication Sig Dispense Refill  . aspirin EC 81 MG tablet Take 81 mg by mouth daily.    . chlorhexidine (PERIDEX) 0.12 % solution 15 mLs by Mouth Rinse route 2 (two) times daily.  0  . cholecalciferol (VITAMIN D) 1000 UNITS tablet Take 1,000 Units  by mouth daily.    . Ciclopirox 0.77 % gel Apply 1 Act topically 2 (two) times daily. (Patient taking differently: Apply 1 Act topically 2 (two) times daily as needed (rash). Uses on ankle and feet) 45 g 3  . Cyanocobalamin 3000 MCG CAPS Take 3,000 mcg by mouth daily.    . dapagliflozin propanediol (FARXIGA) 10 MG TABS tablet TAKE 1 TABLET BY MOUTH DAILY. (Patient taking differently: Take 10 mg by mouth daily. ) 90 tablet 0  . HYDROcodone-acetaminophen (NORCO) 10-325 MG tablet Take 1 tablet by mouth every 6 (six) hours as needed. 30 tablet 0  . insulin aspart (NOVOLOG) 100 UNIT/ML injection Inject into the skin continuous. INSULIN PUMP    . Insulin Glargine (LANTUS SOLOSTAR [place]) Inject 35-45 Units into the skin 2 (two) times daily as needed. Only uses when pump goes out    . Meloxicam (VIVLODEX) 10 MG CAPS Take 1 capsule by mouth daily. (Patient taking differently: Take 10 mg by mouth daily. ) 30 capsule 11  . ondansetron (ZOFRAN ODT) 4 MG disintegrating tablet Take 1 tablet (4 mg total) by mouth every 8 (eight) hours as needed for nausea or vomiting. 20 tablet 0  . oxybutynin (DITROPAN XL) 15 MG 24 hr tablet Take 1 tablet (15 mg total) by mouth at bedtime. 30 tablet 0  . oxymetazoline (AFRIN) 0.05 % nasal spray Place 1 spray into both nostrils 2 (two) times daily as needed  for congestion.    . Pitavastatin Calcium (LIVALO) 2 MG TABS Take 2 mg by mouth daily.     Vladimir Faster Glycol-Propyl Glycol (SYSTANE OP) Place 1 drop into both eyes every 8 (eight) hours as needed (dry eyes).    . Potassium Citrate 15 MEQ (1620 MG) TBCR Take 1 tablet by mouth 2 (two) times daily.    . ramipril (ALTACE) 10 MG tablet Take 10 mg by mouth daily before breakfast.     . sulfamethoxazole-trimethoprim (BACTRIM DS,SEPTRA DS) 800-160 MG tablet Take 1 tablet by mouth 2 (two) times daily. 6 tablet 0  . tadalafil (CIALIS) 20 MG tablet Take 20 mg by mouth daily as needed for erectile dysfunction.    . tamsulosin (FLOMAX) 0.4 MG  CAPS capsule Take 1 capsule (0.4 mg total) by mouth daily. 30 capsule 0  . triamcinolone cream (KENALOG) 0.5 % Apply 1 application topically 3 (three) times daily. (Patient taking differently: Apply 1 application topically 2 (two) times daily as needed (rash). ) 30 g 3  . ZETIA 10 MG tablet TAKE 1 TABLET (10 MG TOTAL) BY MOUTH DAILY. 90 tablet 3   No current facility-administered medications on file prior to visit.    Review of Systems  Constitutional: Positive for fever and chills.  HENT: Positive for congestion, sinus pressure and sore throat.   Respiratory: Positive for cough. Negative for chest tightness and shortness of breath.       Objective:    BP 144/78 mmHg  Pulse 82  Temp(Src) 99 F (37.2 C) (Oral)  Resp 18  Ht 6\' 1"  (1.854 m)  Wt 244 lb (110.678 kg)  BMI 32.20 kg/m2  SpO2 96% Nursing note and vital signs reviewed.  Physical Exam  Constitutional: He is oriented to person, place, and time. He appears well-developed and well-nourished. No distress.  HENT:  Right Ear: Hearing, tympanic membrane, external ear and ear canal normal.  Left Ear: Hearing, tympanic membrane, external ear and ear canal normal.  Nose: Nose normal. Right sinus exhibits no maxillary sinus tenderness and no frontal sinus tenderness. Left sinus exhibits no maxillary sinus tenderness and no frontal sinus tenderness.  Mouth/Throat: Uvula is midline, oropharynx is clear and moist and mucous membranes are normal.  Cardiovascular: Normal rate, regular rhythm, normal heart sounds and intact distal pulses.   Pulmonary/Chest: Effort normal and breath sounds normal.  Neurological: He is alert and oriented to person, place, and time.  Skin: Skin is warm and dry.  Psychiatric: He has a normal mood and affect. His behavior is normal. Judgment and thought content normal.       Assessment & Plan:   Problem List Items Addressed This Visit      Respiratory   Acute upper respiratory infection - Primary     Symptoms and exam consistent with acute upper respiratory infection. Treat conservatively at this time with over-the-counter medications as needed for symptom relief and supportive care. Start Hycodan as needed for cough and sleep. Follow-up if symptoms worsen or fail to improve for possible antibiotic therapy if needed.      Relevant Medications   HYDROcodone-homatropine (HYCODAN) 5-1.5 MG/5ML syrup

## 2015-06-29 NOTE — Patient Instructions (Signed)
Thank you for choosing [facility].  Summary/Instructions:  Your prescription(s) have been submitted to your pharmacy or been printed and provided for you. Please take as directed and contact our office if you believe you are having problem(s) with the medication(s) or have any questions.  If your symptoms worsen or fail to improve, please contact our office for further instruction, or in case of emergency go directly to the emergency room at the closest medical facility.   General Recommendations:    Please drink plenty of fluids.  Get plenty of rest   Sleep in humidified air  Use saline nasal sprays  Netti pot   OTC Medications:  Decongestants - helps relieve congestion   Flonase (generic fluticasone) or Nasacort (generic triamcinolone) - please make sure to use the "cross-over" technique at a 45 degree angle towards the opposite eye as opposed to straight up the nasal passageway.   If you have HIGH BLOOD PRESSURE - Coricidin HBP; AVOID any product that is -D as this contains pseudoephedrine which may increase your blood pressure.  Afrin (oxymetazoline) every 6-8 hours for up to 3 days.   Allergies - helps relieve runny nose, itchy eyes and sneezing   Claritin (generic loratidine), Allegra (fexofenidine), or Zyrtec (generic cyrterizine) for runny nose. These medications should not cause drowsiness.  Note - Benadryl (generic diphenhydramine) may be used however may cause drowsiness  Cough -   Delsym or Robitussin (generic dextromethorphan)  Expectorants - helps loosen mucus to ease removal   Mucinex (generic guaifenesin) as directed on the package.  Headaches / General Aches   Tylenol (generic acetaminophen) - DO NOT EXCEED 3 grams (3,000 mg) in a 24 hour time period  Advil/Motrin (generic ibuprofen)   Sore Throat -   Salt water gargle   Chloraseptic (generic benzocaine) spray or lozenges / Sucrets (generic dyclonine)    Upper Respiratory  Infection, Adult Most upper respiratory infections (URIs) are a viral infection of the air passages leading to the lungs. A URI affects the nose, throat, and upper air passages. The most common type of URI is nasopharyngitis and is typically referred to as "the common cold." URIs run their course and usually go away on their own. Most of the time, a URI does not require medical attention, but sometimes a bacterial infection in the upper airways can follow a viral infection. This is called a secondary infection. Sinus and middle ear infections are common types of secondary upper respiratory infections. Bacterial pneumonia can also complicate a URI. A URI can worsen asthma and chronic obstructive pulmonary disease (COPD). Sometimes, these complications can require emergency medical care and may be life threatening.  CAUSES Almost all URIs are caused by viruses. A virus is a type of germ and can spread from one person to another.  RISKS FACTORS You may be at risk for a URI if:   You smoke.   You have chronic heart or lung disease.  You have a weakened defense (immune) system.   You are very young or very old.   You have nasal allergies or asthma.  You work in crowded or poorly ventilated areas.  You work in health care facilities or schools. SIGNS AND SYMPTOMS  Symptoms typically develop 2-3 days after you come in contact with a cold virus. Most viral URIs last 7-10 days. However, viral URIs from the influenza virus (flu virus) can last 14-18 days and are typically more severe. Symptoms may include:   Runny or stuffy (congested) nose.     Sneezing.   Cough.   Sore throat.   Headache.   Fatigue.   Fever.   Loss of appetite.   Pain in your forehead, behind your eyes, and over your cheekbones (sinus pain).  Muscle aches.  DIAGNOSIS  Your health care provider may diagnose a URI by:  Physical exam.  Tests to check that your symptoms are not due to another condition  such as:  Strep throat.  Sinusitis.  Pneumonia.  Asthma. TREATMENT  A URI goes away on its own with time. It cannot be cured with medicines, but medicines may be prescribed or recommended to relieve symptoms. Medicines may help:  Reduce your fever.  Reduce your cough.  Relieve nasal congestion. HOME CARE INSTRUCTIONS   Take medicines only as directed by your health care provider.   Gargle warm saltwater or take cough drops to comfort your throat as directed by your health care provider.  Use a warm mist humidifier or inhale steam from a shower to increase air moisture. This may make it easier to breathe.  Drink enough fluid to keep your urine clear or pale yellow.   Eat soups and other clear broths and maintain good nutrition.   Rest as needed.   Return to work when your temperature has returned to normal or as your health care provider advises. You may need to stay home longer to avoid infecting others. You can also use a face mask and careful hand washing to prevent spread of the virus.  Increase the usage of your inhaler if you have asthma.   Do not use any tobacco products, including cigarettes, chewing tobacco, or electronic cigarettes. If you need help quitting, ask your health care provider. PREVENTION  The best way to protect yourself from getting a cold is to practice good hygiene.   Avoid oral or hand contact with people with cold symptoms.   Wash your hands often if contact occurs.  There is no clear evidence that vitamin C, vitamin E, echinacea, or exercise reduces the chance of developing a cold. However, it is always recommended to get plenty of rest, exercise, and practice good nutrition.  SEEK MEDICAL CARE IF:   You are getting worse rather than better.   Your symptoms are not controlled by medicine.   You have chills.  You have worsening shortness of breath.  You have brown or red mucus.  You have yellow or brown nasal discharge.  You  have pain in your face, especially when you bend forward.  You have a fever.  You have swollen neck glands.  You have pain while swallowing.  You have white areas in the back of your throat. SEEK IMMEDIATE MEDICAL CARE IF:   You have severe or persistent:  Headache.  Ear pain.  Sinus pain.  Chest pain.  You have chronic lung disease and any of the following:  Wheezing.  Prolonged cough.  Coughing up blood.  A change in your usual mucus.  You have a stiff neck.  You have changes in your:  Vision.  Hearing.  Thinking.  Mood. MAKE SURE YOU:   Understand these instructions.  Will watch your condition.  Will get help right away if you are not doing well or get worse.   This information is not intended to replace advice given to you by your health care provider. Make sure you discuss any questions you have with your health care provider.   Document Released: 12/28/2000 Document Revised: 11/18/2014 Document Reviewed: 10/09/2013 Elsevier Interactive Patient   Education 2016 Elsevier Inc.     

## 2015-06-29 NOTE — Progress Notes (Signed)
Pre visit review using our clinic review tool, if applicable. No additional management support is needed unless otherwise documented below in the visit note. 

## 2015-07-09 ENCOUNTER — Other Ambulatory Visit: Payer: Self-pay | Admitting: Urology

## 2015-07-10 ENCOUNTER — Other Ambulatory Visit: Payer: Self-pay | Admitting: Urology

## 2015-07-10 DIAGNOSIS — N200 Calculus of kidney: Secondary | ICD-10-CM

## 2015-07-17 ENCOUNTER — Ambulatory Visit (INDEPENDENT_AMBULATORY_CARE_PROVIDER_SITE_OTHER): Payer: BC Managed Care – PPO | Admitting: Family Medicine

## 2015-07-17 ENCOUNTER — Encounter: Payer: Self-pay | Admitting: Family Medicine

## 2015-07-17 ENCOUNTER — Other Ambulatory Visit: Payer: Self-pay | Admitting: Internal Medicine

## 2015-07-17 VITALS — BP 146/82 | HR 88 | Temp 98.4°F | Resp 20 | Wt 240.5 lb

## 2015-07-17 DIAGNOSIS — B309 Viral conjunctivitis, unspecified: Secondary | ICD-10-CM

## 2015-07-17 DIAGNOSIS — J069 Acute upper respiratory infection, unspecified: Secondary | ICD-10-CM

## 2015-07-17 MED ORDER — FLUTICASONE PROPIONATE 50 MCG/ACT NA SUSP: 2.0000 | Freq: Every day | NASAL | Status: DC

## 2015-07-17 MED ORDER — NEOMYCIN-POLYMYXIN-HC 3.5-10000-1 OP SUSP: 3.0000 [drp] | Freq: Four times a day (QID) | OPHTHALMIC | Status: AC

## 2015-07-17 MED ORDER — HYDROCODONE-HOMATROPINE 5-1.5 MG/5ML PO SYRP: 5.0000 mL | ORAL_SOLUTION | Freq: Three times a day (TID) | ORAL | Status: DC | PRN

## 2015-07-17 NOTE — Telephone Encounter (Signed)
Pt left msg on triage stating need to get a refill on the cough syrup that was rx by Terri Piedra. Still have cough not spitting up any phlegm but it help suppress the cough. Pls advise...Anthony Daniel

## 2015-07-17 NOTE — Progress Notes (Signed)
[name]., male   DOB: 07-May-1951, 64 y.o.   MRN: [id number]   Subjective:    Patient ID: @PATIENTID @   DOB: Jul 09, 1951, 64 y.o.    MRN: [id number]  HPI  Conjuctivitis: Pt reports a 2 day history of bilateral eye irritation and redness. He woke up this morning nad both eyes were lightly matted shut. He denies watery, itchy or drainage from eyes in the day. He denies visual changes, photophobia or pain. He states his wife is ill with a virus. He does endorse Nasal congestion, runny nose and mild Cough. Pt has called his primary and received a prescription for cough syrup. His appetite is "finally" back to normal after illness 2 weeks ago. He denies nausea, vomit, fever, chills, diarrhea or rash.   He has been taking afrin.  Past Medical History  Diagnosis Date  . Hyperlipidemia   . Hypertension   . History of bladder cancer     1998--  TCC  . Right ureteral stone   . Nephrolithiasis     bilateral --  nonobstructive per CT  . History of kidney stones   . LADA (latent autoimmune diabetes in adults), managed as type 1 ([place])   . Full dentures   . At risk for sleep apnea     STOP-BANG= 5     SENT TO PCP 05-07-2015   Allergies  Allergen Reactions  . Crestor [Rosuvastatin] Other (See Comments)    Muscle aches  . Lipitor [Atorvastatin] Other (See Comments)    Muscle aches  . Contrast Media [Iodinated Diagnostic Agents] Itching and Nausea And Vomiting    PT TRIED THIS TWICE.EVEN WITH PRE-MEDS AND STILL HAD NAUSEA AND ITCHING--WAS TOLD HE CAN NOT TAKE iv ANYMORE  . Latex Other (See Comments)    Irritation  . Adhesive [Tape] Rash  . Peanut-Containing Drug Products Anxiety and Other (See Comments)    Jittery   Past Surgical History  Procedure Laterality Date  . Tonsillectomy  1975  . Laparoscopic appendectomy N/A 11/07/2012    Procedure: APPENDECTOMY LAPAROSCOPIC;  Surgeon: Harl Bowie, MD;  Location: [facility];  Service: General;  Laterality: N/A;  . Cystoscopy with  ureteroscopy and stent placement Right 04/23/2015    Procedure: CYSTOSCOPY WITH URETEROSCOPY RETROGRADE PYELOGRAM AND STENT PLACEMENT;  Surgeon: Cleon Gustin, MD;  Location: [facility];  Service: Urology;  Laterality: Right;  . Transurethral resection of bladder tumor  1998  . Extracorporeal shock wave lithotripsy  x2 in  2006//   x2  in 2012  . Cystoscopy/retrograde/ureteroscopy/stone extraction with basket Right 05/12/2015    Procedure: CYSTOSCOPY/RETROGRADE/URETEROSCOPY/STONE EXTRACTION WITH BASKET;  Surgeon: Cleon Gustin, MD;  Location: [facility];  Service: Urology;  Laterality: Right;  . Cystoscopy w/ ureteral stent placement Right 05/12/2015    Procedure: CYSTOSCOPY WITH STENT REPLACEMENT;  Surgeon: Cleon Gustin, MD;  Location: [facility];  Service: Urology;  Laterality: Right;  . Holmium laser application Right [id number]    Procedure: HOLMIUM LASER APPLICATION;  Surgeon: Cleon Gustin, MD;  Location: [facility];  Service: Urology;  Laterality: Right;   Social History  Substance Use Topics  . Smoking status: Current Every Day Smoker -- 1.00 packs/day for 30 years    Types: Cigarettes  . Smokeless tobacco: Never Used  . Alcohol Use: 3.0 oz/week    5 Shots of liquor per week     Comment: occasional    Review of Systems Negative, with the exception  of above mentioned in HPI     Objective:   Physical Exam BP 146/82 mmHg  Pulse 88  Temp(Src) 98.4 F (36.9 C)  Resp 20  Wt 240 lb 8 oz (109.09 kg)  SpO2 95% Body mass index is 31.74 kg/(m^2). Gen: Afebrile. No acute distress. Nontoxic in appearance. Well developed, well nourished, AAM, very pleasant.  HENT: AT. [place]. Bilateral TM visualized, without erythema or bulging, pink fluid/filled . MMM, no oral lesions. Bilateral nares with mild erythema, no swelling. Throat without erythema, or exudates. Cough and Hoarseness not present. No facial TTP.  Eyes:Pupils Equal  Round Reactive to light, Extraocular movements intact,  Conjunctiva with mild redness bilaterally, No discharge or icterus. Neck/lymp/endocrine: Supple,No  lymphadenopathy CV: RRR  Chest: CTAB, no wheeze or crackles    Assessment & Plan:  Anthony Daniel. is a 64 y.o. present for acute OV with bilateral viral conjunctivitis and viral URI. 1. Viral conjunctivitis - neomycin-polymyxin-hydrocortisone (CORTISPORIN) 3.5-10000-1 ophthalmic suspension; Place 3 drops into both eyes 4 (four) times daily.  Dispense: 7.5 mL; Refill: 0  2. Acute upper respiratory infection - fluticasone (FLONASE) 50 MCG/ACT nasal spray; Place 2 sprays into both nostrils daily.  Dispense: 16 g; Refill:  - symptomatic treatment for virus.  - START Cortisporin for eyes 7 days. - START Flonase, prescribed.  - STOP afrin, dicussed rebound and septal necrosis.  - f/u as needed

## 2015-07-17 NOTE — Patient Instructions (Signed)
Viral Conjunctivitis Viral conjunctivitis is an inflammation of the clear membrane that covers the white part of your eye and the inner surface of your eyelid (conjunctiva). The inflammation is caused by a viral infection. The blood vessels in the conjunctiva become inflamed, causing the eye to become red or pink, and often itchy. Viral conjunctivitis can easily be passed from one person to another (contagious). CAUSES  Viral conjunctivitis is caused by a virus. A virus is a type of contagious germ. It can be spread by touching objects that have been contaminated with the virus, such as doorknobs or towels.  SYMPTOMS  Symptoms of viral conjunctivitis may include:   Eye redness.  Tearing or watery eyes.  Itchy eyes.  Burning feeling in the eyes.  Clear drainage from the eye.  Swollen eyelids.  A gritty feeling in the eye.  Light sensitivity. DIAGNOSIS  Viral conjunctivitis may be diagnosed with a medical history and physical exam. If you have discharge from your eye, the discharge may be tested to rule out other causes of conjunctivitis.  TREATMENT  Viral conjunctivitis does not respond to medicines that kill bacteria (antibiotics). Treatment for viral conjunctivitis is directed at stopping a bacterial infection from developing in addition to the viral infection. Treatment also aims to relieve your symptoms, such as itching. This may be done with antihistamine drops or other eye medicines. HOME CARE INSTRUCTIONS  Take medicines only as directed by your health care provider.  Avoid touching or rubbing your eyes.  Apply a warm, clean washcloth to your eye for 10-20 minutes, 3-4 times per day.  If you wear contact lenses, do not wear them until the inflammation is gone and your health care provider says it is safe to wear them again. Ask your health care provider how to sterilize or replace your contact lenses before using them again. Wear glasses until you can resume wearing  contacts.  Avoid wearing eye makeup until the inflammation is gone. Throw away any old eye cosmetics that may be contaminated.  Change or wash your pillowcase every day.  Do not share towels or washcloths. This may spread the infection.  Wash your hands often with soap and water. Use paper towels to dry your hands.  Gently wipe away any drainage from your eye with a warm, wet washcloth or a cotton ball.  Be very careful to avoid touching the edge of the eyelid with the eye drop bottle or ointment tube when applying medicines to the affected eye. This will stop you from spreading the infection to the other eye or to other people. SEEK MEDICAL CARE IF:   Your symptoms do not improve with treatment.  You have increased pain.  Your vision becomes blurry.  You have a fever.  You have facial pain, redness, or swelling.  You have new symptoms.  Your symptoms get worse.   This information is not intended to replace advice given to you by your health care provider. Make sure you discuss any questions you have with your health care provider.   Document Released: 09/24/2002 Document Revised: 12/26/2005 Document Reviewed: 04/15/2014 Elsevier Interactive Patient Education [employer].   I have called in eye drops and nasal spray to start.

## 2015-07-17 NOTE — Telephone Encounter (Signed)
Medications refilled - will need to pick up Hycodan.

## 2015-07-17 NOTE — Telephone Encounter (Signed)
Notified pt rx ready for pick-up.../lmb 

## 2015-07-28 NOTE — Patient Instructions (Addendum)
YOUR PROCEDURE IS SCHEDULED ON : 08/06/15  REPORT TO [facility] MAIN ENTRANCE FOLLOW SIGNS TO RADIOLOGY AT 7:00 AM   CALL THIS NUMBER IF YOU HAVE PROBLEMS THE MORNING OF SURGERY [phone]  REMEMBER:ONLY 1 PER PERSON MAY GO TO SHORT STAY WITH YOU TO GET READY THE MORNING OF YOUR SURGERY  DO NOT EAT FOOD OR DRINK LIQUIDS AFTER MIDNIGHT  TAKE THESE MEDICINES THE MORNING OF SURGERY:  PILAVASTATIN / FLOMAX / FLUTICASONE NASAL SPRAY   LEAVE INSULIN PUMP ON. AND BRING PUMP SUPPLIES TO HOSPITAL  YOU MAY NOT HAVE ANY METAL ON YOUR BODY INCLUDING HAIR PINS AND PIERCING'S. DO NOT WEAR JEWELRY, MAKEUP, LOTIONS, POWDERS OR PERFUMES. DO NOT WEAR NAIL POLISH. DO NOT SHAVE 48 HRS PRIOR TO SURGERY. MEN MAY SHAVE FACE AND NECK.  DO NOT [place]. [place] IS NOT RESPONSIBLE FOR VALUABLES.  CONTACTS, DENTURES OR PARTIALS MAY NOT BE WORN TO SURGERY. LEAVE SUITCASE IN CAR. CAN BE BROUGHT TO ROOM AFTER SURGERY.  PATIENTS DISCHARGED THE DAY OF SURGERY WILL NOT BE ALLOWED TO DRIVE HOME.  PLEASE READ OVER THE FOLLOWING INSTRUCTION SHEETS _________________________________________________________________________________                                          [place] - PREPARING FOR SURGERY  Before surgery, you can play an important role.  Because skin is not sterile, your skin needs to be as free of germs as possible.  You can reduce the number of germs on your skin by washing with CHG (chlorahexidine gluconate) soap before surgery.  CHG is an antiseptic cleaner which kills germs and bonds with the skin to continue killing germs even after washing. Please DO NOT use if you have an allergy to CHG or antibacterial soaps.  If your skin becomes reddened/irritated stop using the CHG and inform your nurse when you arrive at Short Stay. Do not shave (including legs and underarms) for at least 48 hours prior to the first CHG shower.  You may shave your face. Please  follow these instructions carefully:   1.  Shower with CHG Soap the night before surgery and the  morning of Surgery.   2.  If you choose to wash your hair, wash your hair first as usual with your  normal  Shampoo.   3.  After you shampoo, rinse your hair and body thoroughly to remove the  shampoo.                                         4.  Use CHG as you would any other liquid soap.  You can apply chg directly  to the skin and wash . Gently wash with scrungie or clean wascloth    5.  Apply the CHG Soap to your body ONLY FROM THE NECK DOWN.   Do not use on open                           Wound or open sores. Avoid contact with eyes, ears mouth and genitals (private parts).                        Genitals (private parts) with your normal  soap.              6.  Wash thoroughly, paying special attention to the area where your surgery  will be performed.   7.  Thoroughly rinse your body with warm water from the neck down.   8.  DO NOT shower/wash with your normal soap after using and rinsing off  the CHG Soap .                9.  Pat yourself dry with a clean towel.             10.  Wear clean night clothes to bed after shower             11.  Place clean sheets on your bed the night of your first shower and do not  sleep with pets.  Day of Surgery : Do not apply any lotions/deodorants the morning of surgery.  Please wear clean clothes to the hospital/surgery center.  FAILURE TO FOLLOW THESE INSTRUCTIONS MAY RESULT IN THE CANCELLATION OF YOUR SURGERY    PATIENT SIGNATURE_________________________________  ______________________________________________________________________

## 2015-07-30 ENCOUNTER — Encounter (HOSPITAL_COMMUNITY): Payer: Self-pay

## 2015-07-30 ENCOUNTER — Encounter (HOSPITAL_COMMUNITY)
Admission: RE | Admit: 2015-07-30 | Discharge: 2015-07-30 | Disposition: A | Discharge status: Home / Self Care | Payer: BC Managed Care – PPO | Source: Ambulatory Visit | Attending: Urology | Admitting: Urology

## 2015-07-30 DIAGNOSIS — N200 Calculus of kidney: Secondary | ICD-10-CM | POA: Diagnosis not present

## 2015-07-30 DIAGNOSIS — Z0183 Encounter for blood typing: Secondary | ICD-10-CM | POA: Insufficient documentation

## 2015-07-30 DIAGNOSIS — Z01812 Encounter for preprocedural laboratory examination: Secondary | ICD-10-CM | POA: Diagnosis not present

## 2015-07-30 LAB — CBC
HCT: 46.5 % (ref 39.0–52.0)
Hemoglobin: 15.4 g/dL (ref 13.0–17.0)
MCH: 30.2 pg (ref 26.0–34.0)
MCHC: 33.1 g/dL (ref 30.0–36.0)
MCV: 91.2 fL (ref 78.0–100.0)
Platelets: 168 10*3/uL (ref 150–400)
RBC: 5.1 MIL/uL (ref 4.22–5.81)
RDW: 15.4 % (ref 11.5–15.5)
WBC: 9.3 10*3/uL (ref 4.0–10.5)

## 2015-07-30 LAB — BASIC METABOLIC PANEL
Anion gap: 9 (ref 5–15)
BUN: 17 mg/dL (ref 6–20)
CO2: 26 mmol/L (ref 22–32)
Calcium: 9.1 mg/dL (ref 8.9–10.3)
Chloride: 107 mmol/L (ref 101–111)
Creatinine, Ser: 0.98 mg/dL (ref 0.61–1.24)
GFR calc Af Amer: >60 mL/min (ref >60)
GFR calc non Af Amer: >60 mL/min (ref >60)
Glucose, Bld: 117 mg/dL — ABNORMAL HIGH (ref 65–99)
Potassium: 4.6 mmol/L (ref 3.5–5.1)
Sodium: 142 mmol/L (ref 135–145)

## 2015-07-30 LAB — ABO/RH: ABO/RH(D): A POS

## 2015-07-30 NOTE — Progress Notes (Signed)
   07/30/15 1042  OBSTRUCTIVE SLEEP APNEA  Have you ever been diagnosed with sleep apnea through a sleep study? No  Do you snore loudly (loud enough to be heard through closed doors)?  1  Do you often feel tired, fatigued, or sleepy during the daytime (such as falling asleep during driving or talking to someone)? 0  Has anyone observed you stop breathing during your sleep? 0  Do you have, or are you being treated for high blood pressure? 1  BMI more than 35 kg/m2? 0  Age > 50 (1-yes) 1  Neck circumference greater than:Male 16 inches or larger, Male 17inches or larger? 1  Male Gender (Yes=1) 1  Obstructive Sleep Apnea Score 5  Score 5 or greater  Results sent to PCP

## 2015-07-31 LAB — PSA: PSA: 2.7

## 2015-08-04 ENCOUNTER — Telehealth: Payer: Self-pay | Admitting: Internal Medicine

## 2015-08-04 ENCOUNTER — Telehealth: Payer: Self-pay | Admitting: Radiology

## 2015-08-04 ENCOUNTER — Other Ambulatory Visit: Payer: Self-pay | Admitting: Radiology

## 2015-08-04 NOTE — Telephone Encounter (Signed)
[facility] called request PA for Meloxicam (VIVLODEX) 10 MG CAPS to be send in. Please help, PA # [id number]

## 2015-08-04 NOTE — Progress Notes (Signed)
   Pt is allergic to contrast Itching, hives, N/V  Scheduled for Nephroureteral stent placement 1/19 at [facility]  Must be pre medicated per Dr Laurence Ferrari  Discussed with Pt  Called Pred 50 mg #3            Benadryl 50 mg #1 To [facility] [address] 1/17 120 pm  Gave pt and pharmacy instructions for Rx Answered all questions

## 2015-08-04 NOTE — Progress Notes (Signed)
PT SIGNED INSULIN PUMP CONTRACT PT INFORMED TO BRING INSULIN PUMP SUPPLIES PT INSTRUCTED TO LEAVE PUMP ON UNTIL GETTING TO HOSPITAL  POSTED ON OR SCHEDULE DIABETIC COORDINATOR NOTIFIED [facility] FROM ENDOCRINOLOGIST - DR.KERR    PLACED ON CHART

## 2015-08-05 NOTE — H&P (Signed)
Active Problems Problems  1. Asymptomatic microscopic hematuria (R31.21) 2. Benign prostatic hyperplasia with urinary obstruction (N40.1,N13.8) 3. Elevated prostate specific antigen (PSA) (R97.20) 4. Erectile dysfunction due to arterial insufficiency (N52.01) 5. Kidney stone on left side (N20.0) 6. Microscopic hematuria (R31.29) 7. Personal history of bladder cancer (Z85.51) 8. Preventive medication therapy needed (Z41.8) 9. Weak urinary stream (R39.12)  History of Present Illness [name] returns today in f/u. He had a right stent followed by right ureteroscopy for renal stones in October. He has left renal stones and is scheduled for a PCNL on 1/19 for a LLP partial staghorn stone. His PSA was 3.86 prior to his October visit but was 3.61 in November but he had the transurethral instrumentation in October and had a negative biopsy and a 70ml prostate in 8/15. He has no flank pain or hematuria at this time.  He remains on tamsulosin for his BPH with BOO and is voiding well. He is on potassium citrate chronically.   Past Medical History Problems  1. History of hypercholesterolemia (Z86.39) 2. History of hypertension (Z86.79) 3. History of type 2 diabetes mellitus (Z86.39) 4. History of Kidney stone on right side (N20.0) 5. History of Neoplasm Of The Prostate Gland 6. History of Nephrolithiasis Of The Left Kidney 7. Personal history of bladder cancer (Z85.51)  Surgical History Problems  1. History of Cystoscopy Bladder Tumor 2. History of Cystoscopy Ureteroscopy Lithotripsy Incl Insert Indwelling Ureter Stent 3. Preventive medication therapy needed (Z41.8) 4. History of Renal Lithotripsy 5. History of Renal Lithotripsy 6. History of Renal Lithotripsy  Current Meds 1. Altace 10 MG Oral Capsule;  Therapy: (Recorded:05Feb2008) to Recorded 2. BD Insulin Syringe MicroFine 28G X 1/2" 0.5 ML Miscellaneous;  Therapy: 24Jan2012 to Recorded 3. BD Pen Needle Ultrafine 29G X 12.7MM  Miscellaneous;  Therapy: 06Aug2012 to Recorded 4. BG Star Test STRP;  Therapy: 20Jun2012 to Recorded 5. Cialis 20 MG Oral Tablet; TAKE 1 TABLET BY MOUTH AS DIRECTED;  Therapy: 22Nov2010 to (Evaluate:08Jul2016)  Requested for: 27Apr2016; Last  Rx:27Apr2016 Ordered 6. Ecotrin 325 MG Oral Tablet Delayed Release;  Therapy: (Recorded:12Jan2017) to Recorded 7. Farxiga 10 MG Oral Tablet;  Therapy: (Recorded:24Aug2015) to Recorded 8. Fluticasone Propionate 50 MCG/ACT Nasal Suspension;  Therapy: (Recorded:12Jan2017) to Recorded 9. HumaLOG 100 UNIT/ML Subcutaneous Solution;  Therapy: (Recorded:26Aug2010) to Recorded 10. Omeprazole 20 MG Oral Capsule Delayed Release; as needed only per pt;   Therapy: [id number] to Recorded 11. Ondansetron HCl TABS;   Therapy: (Recorded:12Jan2017) to Recorded 12. Potassium Citrate ER 15 MEQ (1620 MG) Oral Tablet Extended Release; Take 1 tablet   twice daily;   Therapy: 21Sep2011 to (Evaluate:08May2017)  Requested for: [id number]; Last   Rx:09Nov2016 Ordered 13. Ramipril CAPS;   Therapy: (Recorded:12Jan2017) to Recorded 14. Sildenafil Citrate 20 MG Oral Tablet; take 2-5 tabs as needed;   Therapy: 27Apr2016 to (Last Rx:27Apr2016) Ordered 15. Systane Ultra SOLN;   Therapy: (Recorded:12Jan2017) to Recorded 16. Tamsulosin HCl - 0.4 MG Oral Capsule; TAKE ONE CAPSULE BY MOUTH EVERY DAY;   Therapy: [id number] to (Evaluate:22Apr2017)  Requested for: [id number]; Last   Rx:27Apr2016; Status: ACTIVE - Renewal Denied Ordered 17. Vivlodex CAPS;   Therapy: (Recorded:12Jan2017) to Recorded 18. Zetia TABS;   Therapy: (Recorded:12Jan2017) to Recorded  Allergies Medication  1. Statins 2. Contrast Media Ready-Box MISC 3. iodine 4. Latex Gloves MISC 5. Livalo TABS 6. rosuvastatin  Family History Problems  1. Family history of Atrial Fibrillation : Father 2. Family history of Cardiac Failure : Father 3. Family history of Family Health  Status Number Of Children   2 sons  1 daughter  Social History Problems  1. Alcohol Use (History)   1 2. Caffeine Use   2 coffee 3. Current smoker (F17.200)   12-15 cigarettes a day x 20 years 4. Marital History - Currently Married 5. Occupation:   Scientist, physiological 6. Retired 16. Tobacco Use   1 pack, 25 years, current  Review of Systems Genitourinary, constitutional, skin, eye, otolaryngeal, hematologic/lymphatic, cardiovascular, pulmonary, endocrine, musculoskeletal, gastrointestinal, neurological and psychiatric system(s) were reviewed and pertinent findings if present are noted and are otherwise negative.  Genitourinary: no hematuria.  Gastrointestinal: no flank pain.  Constitutional: no fever.  Cardiovascular: no chest pain.  Respiratory: no shortness of breath.    Vitals Vital Signs [Data Includes: Last 1 Day]  Recorded: 12Jan2017 01:39PM  Weight: 238 lb  BMI Calculated: 31.4 BSA Calculated: 2.32 Blood Pressure: 117 / 77 Temperature: 98 F Heart Rate: 87  Physical Exam Constitutional: Well nourished and well developed . No acute distress.  ENT:. The ears and nose are normal in appearance.  Neck: The appearance of the neck is normal and no neck mass is present.  Pulmonary: No respiratory distress and normal respiratory rhythm and effort.  Cardiovascular: Heart rate and rhythm are normal . No peripheral edema.  Abdomen: The abdomen is mildly obese. The abdomen is soft and nontender. No masses are palpated. No CVA tenderness. No hernias are palpable. No hepatosplenomegaly noted.  Lymphatics: The supraclavicular and axillary nodes are not enlarged or tender.  Skin: Normal skin turgor, no visible rash and no visible skin lesions.  Neuro/Psych:. Mood and affect are appropriate. Normal sensation of the perineum/perianal region (S3,4,5).    Results/Data Urine [Data Includes: Last 1 Day]   [id number]  COLOR YELLOW   APPEARANCE CLEAR   SPECIFIC GRAVITY 1.015   pH 5.5   GLUCOSE 3+   BILIRUBIN NEGATIVE    KETONE NEGATIVE   BLOOD 2+   PROTEIN NEGATIVE   NITRITE NEGATIVE   LEUKOCYTE ESTERASE NEGATIVE   SQUAMOUS EPITHELIAL/HPF 0-5 HPF  WBC NONE SEEN WBC/HPF  RBC 3-10 RBC/HPF  BACTERIA NONE SEEN HPF  CRYSTALS NONE SEEN HPF  CASTS NONE SEEN LPF  Other    Yeast NONE SEEN HPF   The following clinical lab reports were reviewed:  UA and PSA reviewed. Selected Results  HYPERCALCIURA PROFILE [id number] 01:15PM Irine Seal  SPECIMEN TYPE: BLOOD   Test Name Result Flag Reference  PHOSPHORUS 3.0 mg/dL  2.5-4.5  URIC ACID 4.4 mg/dL  4.0-7.8  SODIUM 140 mmol/L  135-146  POTASSIUM 4.4 mmol/L  3.5-5.3  CHLORIDE 106 mmol/L  98-110  CO2 28 mmol/L  20-31  CREATININE 0.92 mg/dL  0.50-1.50  PARATHYROID HORMONE 36 pg/mL  14-64  CALCIUM 8.8 mg/dL  8.4-10.5   Assessment Assessed  1. Kidney stone on left side (N20.0) 2. Elevated prostate specific antigen (PSA) (R97.20) 3. Erectile dysfunction due to arterial insufficiency (N52.01)  His PSA came back down a little bit but needs repeating today.  He has no symptoms related to his stone.   Plan Elevated prostate specific antigen (PSA)  1. PSA REFLEX TO FREE; Status:Hold For - Specimen/Data Collection,Appointment;  Requested [id number];  2. Follow-up Keep Future Appt Office  Follow-up  Status: Hold For - Appointment  Requested  for: [phone] Erectile dysfunction due to arterial insufficiency  3. Start: Sildenafil Citrate 20 MG Oral Tablet; take 1-5 tab PO as directed Health Maintenance  4. UA With REFLEX; [Do Not Release]; Status:Resulted - Requires Verification;  [id number] 01:27PM  I reviewed the procedure and risks again today.  I will repeat a PSA today and if it remains elevated he will need a repeat biopsy.  He requested a refill on the sildenafil today.   Discussion/Summary CC: Dr. Scarlette Calico.

## 2015-08-06 ENCOUNTER — Ambulatory Visit (HOSPITAL_COMMUNITY): Payer: BC Managed Care – PPO | Admitting: Anesthesiology

## 2015-08-06 ENCOUNTER — Inpatient Hospital Stay (HOSPITAL_COMMUNITY)
Admission: AD | Admit: 2015-08-06 | Discharge: 2015-08-11 | DRG: 660 | Disposition: A | Discharge status: Home / Self Care | Payer: BC Managed Care – PPO | Source: Ambulatory Visit | Attending: Urology | Admitting: Urology

## 2015-08-06 ENCOUNTER — Encounter (HOSPITAL_COMMUNITY)
Admission: AD | Disposition: A | Discharge status: Home / Self Care | Payer: Self-pay | Source: Ambulatory Visit | Attending: Urology

## 2015-08-06 ENCOUNTER — Ambulatory Visit (HOSPITAL_COMMUNITY)
Admission: RE | Admit: 2015-08-06 | Discharge: 2015-08-06 | Disposition: A | Discharge status: Home / Self Care | Payer: BC Managed Care – PPO | Source: Ambulatory Visit | Attending: Urology | Admitting: Urology

## 2015-08-06 ENCOUNTER — Ambulatory Visit (HOSPITAL_COMMUNITY): Payer: BC Managed Care – PPO

## 2015-08-06 ENCOUNTER — Encounter (HOSPITAL_COMMUNITY): Payer: Self-pay | Admitting: *Deleted

## 2015-08-06 DIAGNOSIS — N400 Enlarged prostate without lower urinary tract symptoms: Secondary | ICD-10-CM | POA: Diagnosis present

## 2015-08-06 DIAGNOSIS — IMO0002 Reserved for concepts with insufficient information to code with codable children: Secondary | ICD-10-CM | POA: Diagnosis not present

## 2015-08-06 DIAGNOSIS — Z8551 Personal history of malignant neoplasm of bladder: Secondary | ICD-10-CM

## 2015-08-06 DIAGNOSIS — Z885 Allergy status to narcotic agent status: Secondary | ICD-10-CM

## 2015-08-06 DIAGNOSIS — I771 Stricture of artery: Secondary | ICD-10-CM | POA: Diagnosis present

## 2015-08-06 DIAGNOSIS — I2510 Atherosclerotic heart disease of native coronary artery without angina pectoris: Secondary | ICD-10-CM | POA: Diagnosis present

## 2015-08-06 DIAGNOSIS — Z91010 Allergy to peanuts: Secondary | ICD-10-CM

## 2015-08-06 DIAGNOSIS — Z91040 Latex allergy status: Secondary | ICD-10-CM

## 2015-08-06 DIAGNOSIS — Z91041 Radiographic dye allergy status: Secondary | ICD-10-CM

## 2015-08-06 DIAGNOSIS — N132 Hydronephrosis with renal and ureteral calculous obstruction: Principal | ICD-10-CM | POA: Diagnosis present

## 2015-08-06 DIAGNOSIS — N5201 Erectile dysfunction due to arterial insufficiency: Secondary | ICD-10-CM | POA: Diagnosis present

## 2015-08-06 DIAGNOSIS — R52 Pain, unspecified: Secondary | ICD-10-CM

## 2015-08-06 DIAGNOSIS — F17210 Nicotine dependence, cigarettes, uncomplicated: Secondary | ICD-10-CM | POA: Diagnosis present

## 2015-08-06 DIAGNOSIS — Z7982 Long term (current) use of aspirin: Secondary | ICD-10-CM

## 2015-08-06 DIAGNOSIS — Z79899 Other long term (current) drug therapy: Secondary | ICD-10-CM

## 2015-08-06 DIAGNOSIS — N200 Calculus of kidney: Secondary | ICD-10-CM | POA: Insufficient documentation

## 2015-08-06 DIAGNOSIS — Z888 Allergy status to other drugs, medicaments and biological substances status: Secondary | ICD-10-CM

## 2015-08-06 DIAGNOSIS — N368 Other specified disorders of urethra: Secondary | ICD-10-CM | POA: Diagnosis present

## 2015-08-06 DIAGNOSIS — E785 Hyperlipidemia, unspecified: Secondary | ICD-10-CM | POA: Diagnosis present

## 2015-08-06 DIAGNOSIS — I10 Essential (primary) hypertension: Secondary | ICD-10-CM | POA: Diagnosis present

## 2015-08-06 DIAGNOSIS — Z794 Long term (current) use of insulin: Secondary | ICD-10-CM

## 2015-08-06 HISTORY — PX: NEPHROLITHOTOMY: SHX5134

## 2015-08-06 LAB — BASIC METABOLIC PANEL
Anion gap: 10 (ref 5–15)
Anion gap: 10 (ref 5–15)
BUN: 17 mg/dL (ref 6–20)
BUN: 15 mg/dL (ref 6–20)
CO2: 22 mmol/L (ref 22–32)
CO2: 21 mmol/L — ABNORMAL LOW (ref 22–32)
Calcium: 9.3 mg/dL (ref 8.9–10.3)
Calcium: 8.4 mg/dL — ABNORMAL LOW (ref 8.9–10.3)
Chloride: 110 mmol/L (ref 101–111)
Chloride: 105 mmol/L (ref 101–111)
Creatinine, Ser: 0.9 mg/dL (ref 0.61–1.24)
Creatinine, Ser: 0.88 mg/dL (ref 0.61–1.24)
GFR calc Af Amer: >60 mL/min (ref >60)
GFR calc Af Amer: >60 mL/min (ref >60)
GFR calc non Af Amer: >60 mL/min (ref >60)
GFR calc non Af Amer: >60 mL/min (ref >60)
Glucose, Bld: 179 mg/dL — ABNORMAL HIGH (ref 65–99)
Glucose, Bld: 257 mg/dL — ABNORMAL HIGH (ref 65–99)
Potassium: 4.1 mmol/L (ref 3.5–5.1)
Potassium: 5.6 mmol/L — ABNORMAL HIGH (ref 3.5–5.1)
Sodium: 142 mmol/L (ref 135–145)
Sodium: 136 mmol/L (ref 135–145)

## 2015-08-06 LAB — GLUCOSE, CAPILLARY
Glucose-Capillary: 189 mg/dL — ABNORMAL HIGH (ref 65–99)
Glucose-Capillary: 113 mg/dL — ABNORMAL HIGH (ref 65–99)
Glucose-Capillary: 117 mg/dL — ABNORMAL HIGH (ref 65–99)
Glucose-Capillary: 194 mg/dL — ABNORMAL HIGH (ref 65–99)
Glucose-Capillary: 248 mg/dL — ABNORMAL HIGH (ref 65–99)
Glucose-Capillary: 169 mg/dL — ABNORMAL HIGH (ref 65–99)
Glucose-Capillary: 224 mg/dL — ABNORMAL HIGH (ref 65–99)
Glucose-Capillary: 230 mg/dL — ABNORMAL HIGH (ref 65–99)
Glucose-Capillary: 331 mg/dL — ABNORMAL HIGH (ref 65–99)

## 2015-08-06 LAB — CBC
HCT: 44.8 % (ref 39.0–52.0)
Hemoglobin: 14.7 g/dL (ref 13.0–17.0)
MCH: 29.9 pg (ref 26.0–34.0)
MCHC: 32.8 g/dL (ref 30.0–36.0)
MCV: 91.1 fL (ref 78.0–100.0)
Platelets: 193 10*3/uL (ref 150–400)
RBC: 4.92 MIL/uL (ref 4.22–5.81)
RDW: 15.5 % (ref 11.5–15.5)
WBC: 13 10*3/uL — ABNORMAL HIGH (ref 4.0–10.5)

## 2015-08-06 LAB — TYPE AND SCREEN
ABO/RH(D): A POS
Antibody Screen: NEGATIVE

## 2015-08-06 LAB — HEMOGLOBIN AND HEMATOCRIT, BLOOD
HCT: 43.8 % (ref 39.0–52.0)
Hemoglobin: 14.7 g/dL (ref 13.0–17.0)

## 2015-08-06 LAB — PROTIME-INR
INR: 1.02 (ref 0.00–1.49)
Prothrombin Time: 13.6 seconds (ref 11.6–15.2)

## 2015-08-06 LAB — APTT: aPTT: 31 seconds (ref 24–37)

## 2015-08-06 SURGERY — NEPHROLITHOTOMY PERCUTANEOUS
Anesthesia: General | Laterality: Left

## 2015-08-06 MED ORDER — IOHEXOL 300 MG/ML  SOLN
50.0000 mL | Freq: Once | INTRAMUSCULAR | Status: AC | PRN
  Administered 2015-08-06: 15 mL via INTRAVENOUS

## 2015-08-06 MED ORDER — LACTATED RINGERS IV SOLN
INTRAVENOUS | Status: DC | PRN
  Administered 2015-08-06: 12:00:00 via INTRAVENOUS
  Administered 2015-08-06: 1000 mL
  Administered 2015-08-06: 13:00:00 via INTRAVENOUS

## 2015-08-06 MED ORDER — OXYMETAZOLINE HCL 0.05 % NA SOLN: 1.0000 | Freq: Two times a day (BID) | NASAL | Status: DC | PRN

## 2015-08-06 MED ORDER — FENTANYL CITRATE (PF) 100 MCG/2ML IJ SOLN
INTRAMUSCULAR | Status: AC | PRN
  Administered 2015-08-06: 25 ug via INTRAVENOUS
  Administered 2015-08-06 (×3): 50 ug via INTRAVENOUS

## 2015-08-06 MED ORDER — GLYCOPYRROLATE 0.2 MG/ML IJ SOLN
INTRAMUSCULAR | Status: DC | PRN
  Administered 2015-08-06: 0.2 mg via INTRAVENOUS

## 2015-08-06 MED ORDER — HYDROCODONE-ACETAMINOPHEN 5-325 MG PO TABS
1.0000 | ORAL_TABLET | ORAL | Status: DC | PRN
Start: 2015-08-06 — End: 2015-08-10
  Administered 2015-08-07 (×2): 1 via ORAL
  Administered 2015-08-09 – 2015-08-10 (×8): 2 via ORAL
  Filled 2015-08-06 (×7): qty 2
  Filled 2015-08-06: qty 1
  Filled 2015-08-06: qty 2

## 2015-08-06 MED ORDER — HYDROMORPHONE HCL 1 MG/ML IJ SOLN
INTRAMUSCULAR | Status: AC
  Filled 2015-08-06: qty 1

## 2015-08-06 MED ORDER — SODIUM CHLORIDE 0.9 % IR SOLN
Status: DC | PRN
  Administered 2015-08-06: 12000 mL

## 2015-08-06 MED ORDER — INSULIN PUMP
SUBCUTANEOUS | Status: DC
  Administered 2015-08-06 – 2015-08-07 (×5): via SUBCUTANEOUS
  Administered 2015-08-07: 6 via SUBCUTANEOUS
  Administered 2015-08-07 – 2015-08-09 (×6): via SUBCUTANEOUS
  Administered 2015-08-09: 1 via SUBCUTANEOUS
  Administered 2015-08-09 – 2015-08-11 (×6): via SUBCUTANEOUS
  Administered 2015-08-11: 1 via SUBCUTANEOUS
  Filled 2015-08-06: qty 1

## 2015-08-06 MED ORDER — ACETAMINOPHEN 325 MG PO TABS: 650.0000 mg | ORAL_TABLET | ORAL | Status: DC | PRN

## 2015-08-06 MED ORDER — ROCURONIUM BROMIDE 100 MG/10ML IV SOLN
INTRAVENOUS | Status: DC | PRN
  Administered 2015-08-06: 50 mg via INTRAVENOUS

## 2015-08-06 MED ORDER — POLYETHYL GLYCOL-PROPYL GLYCOL 0.4-0.3 % OP GEL: Freq: Three times a day (TID) | OPHTHALMIC | Status: DC | PRN

## 2015-08-06 MED ORDER — LIDOCAINE HCL (CARDIAC) 20 MG/ML IV SOLN
INTRAVENOUS | Status: DC | PRN
  Administered 2015-08-06: 25 mg via INTRAVENOUS
  Administered 2015-08-06: 100 mg via INTRAVENOUS

## 2015-08-06 MED ORDER — POTASSIUM CHLORIDE IN NACL 20-0.45 MEQ/L-% IV SOLN
INTRAVENOUS | Status: DC
  Filled 2015-08-06: qty 1000

## 2015-08-06 MED ORDER — FLUTICASONE PROPIONATE 50 MCG/ACT NA SUSP
2.0000 | Freq: Every day | NASAL | Status: DC
  Administered 2015-08-06 – 2015-08-07 (×2): 2 via NASAL
  Filled 2015-08-06: qty 16

## 2015-08-06 MED ORDER — CIPROFLOXACIN IN D5W 400 MG/200ML IV SOLN
400.0000 mg | INTRAVENOUS | Status: DC
  Filled 2015-08-06: qty 200

## 2015-08-06 MED ORDER — ZOLPIDEM TARTRATE 5 MG PO TABS: 5.0000 mg | ORAL_TABLET | Freq: Every evening | ORAL | Status: DC | PRN

## 2015-08-06 MED ORDER — ONDANSETRON HCL 4 MG/2ML IJ SOLN: 4.0000 mg | Freq: Once | INTRAMUSCULAR | Status: DC | PRN

## 2015-08-06 MED ORDER — DIPHENHYDRAMINE HCL 50 MG PO CAPS: 50.0000 mg | ORAL_CAPSULE | Freq: Every evening | ORAL | Status: DC | PRN

## 2015-08-06 MED ORDER — INSULIN ASPART 100 UNIT/ML ~~LOC~~ SOLN: 0.0000 [IU] | Freq: Three times a day (TID) | SUBCUTANEOUS | Status: DC

## 2015-08-06 MED ORDER — EZETIMIBE 10 MG PO TABS
10.0000 mg | ORAL_TABLET | Freq: Every day | ORAL | Status: DC
  Administered 2015-08-06 – 2015-08-11 (×6): 10 mg via ORAL
  Filled 2015-08-06 (×6): qty 1

## 2015-08-06 MED ORDER — PROPOFOL 10 MG/ML IV BOLUS
INTRAVENOUS | Status: AC
  Filled 2015-08-06: qty 20

## 2015-08-06 MED ORDER — LABETALOL HCL 5 MG/ML IV SOLN
INTRAVENOUS | Status: DC | PRN
  Administered 2015-08-06: 5 mg via INTRAVENOUS

## 2015-08-06 MED ORDER — NEOMYCIN-POLYMYXIN-DEXAMETH 3.5-10000-0.1 OP SUSP
1.0000 [drp] | Freq: Four times a day (QID) | OPHTHALMIC | Status: DC
  Administered 2015-08-06 – 2015-08-07 (×5): 1 [drp] via OPHTHALMIC
  Filled 2015-08-06: qty 5

## 2015-08-06 MED ORDER — FENTANYL CITRATE (PF) 100 MCG/2ML IJ SOLN
INTRAMUSCULAR | Status: AC
  Filled 2015-08-06: qty 4

## 2015-08-06 MED ORDER — POLYVINYL ALCOHOL 1.4 % OP SOLN
1.0000 [drp] | OPHTHALMIC | Status: DC | PRN
  Filled 2015-08-06: qty 15

## 2015-08-06 MED ORDER — ONDANSETRON HCL 4 MG/2ML IJ SOLN
INTRAMUSCULAR | Status: AC
  Filled 2015-08-06: qty 2

## 2015-08-06 MED ORDER — SODIUM CHLORIDE 0.9 % IV SOLN
Freq: Once | INTRAVENOUS | Status: AC
  Administered 2015-08-06: 09:00:00 via INTRAVENOUS

## 2015-08-06 MED ORDER — INSULIN PUMP
1.0000 | SUBCUTANEOUS | Status: DC
Start: 2015-08-06 — End: 2015-08-11
  Administered 2015-08-06: 1 via SUBCUTANEOUS
  Filled 2015-08-06: qty 1

## 2015-08-06 MED ORDER — HYDROCODONE-ACETAMINOPHEN 5-325 MG PO TABS: 1.0000 | ORAL_TABLET | Freq: Four times a day (QID) | ORAL | Status: DC | PRN

## 2015-08-06 MED ORDER — CANAGLIFLOZIN 100 MG PO TABS
100.0000 mg | ORAL_TABLET | Freq: Every day | ORAL | Status: DC
  Filled 2015-08-06: qty 1

## 2015-08-06 MED ORDER — ONDANSETRON HCL 4 MG/2ML IJ SOLN
4.0000 mg | INTRAMUSCULAR | Status: DC | PRN
  Administered 2015-08-07 – 2015-08-09 (×4): 4 mg via INTRAVENOUS
  Filled 2015-08-06 (×4): qty 2

## 2015-08-06 MED ORDER — RAMIPRIL 10 MG PO CAPS
10.0000 mg | ORAL_CAPSULE | Freq: Every day | ORAL | Status: DC
  Administered 2015-08-06 – 2015-08-11 (×6): 10 mg via ORAL
  Filled 2015-08-06 (×6): qty 1

## 2015-08-06 MED ORDER — IOHEXOL 300 MG/ML  SOLN
INTRAMUSCULAR | Status: DC | PRN
  Administered 2015-08-06: 10 mL

## 2015-08-06 MED ORDER — SODIUM CHLORIDE 0.45 % IV SOLN
INTRAVENOUS | Status: DC
  Administered 2015-08-06 – 2015-08-11 (×8): via INTRAVENOUS

## 2015-08-06 MED ORDER — FENTANYL CITRATE (PF) 100 MCG/2ML IJ SOLN
INTRAMUSCULAR | Status: AC
  Filled 2015-08-06: qty 2

## 2015-08-06 MED ORDER — FENTANYL CITRATE (PF) 250 MCG/5ML IJ SOLN
INTRAMUSCULAR | Status: AC
  Filled 2015-08-06: qty 5

## 2015-08-06 MED ORDER — ONDANSETRON HCL 4 MG/2ML IJ SOLN
INTRAMUSCULAR | Status: DC | PRN
  Administered 2015-08-06: 4 mg via INTRAVENOUS

## 2015-08-06 MED ORDER — HYDROMORPHONE HCL 1 MG/ML IJ SOLN
0.5000 mg | INTRAMUSCULAR | Status: DC | PRN
Start: 2015-08-06 — End: 2015-08-11
  Administered 2015-08-06 – 2015-08-08 (×13): 1 mg via INTRAVENOUS
  Administered 2015-08-09: 0.5 mg via INTRAVENOUS
  Administered 2015-08-09 (×2): 1 mg via INTRAVENOUS
  Filled 2015-08-06 (×17): qty 1

## 2015-08-06 MED ORDER — LIDOCAINE HCL (CARDIAC) 20 MG/ML IV SOLN
INTRAVENOUS | Status: AC
  Filled 2015-08-06: qty 5

## 2015-08-06 MED ORDER — SUGAMMADEX SODIUM 200 MG/2ML IV SOLN
INTRAVENOUS | Status: DC | PRN
  Administered 2015-08-06: 200 mg via INTRAVENOUS

## 2015-08-06 MED ORDER — MIDAZOLAM HCL 2 MG/2ML IJ SOLN
INTRAMUSCULAR | Status: AC | PRN
  Administered 2015-08-06 (×4): 1 mg via INTRAVENOUS

## 2015-08-06 MED ORDER — FENTANYL CITRATE (PF) 100 MCG/2ML IJ SOLN
INTRAMUSCULAR | Status: DC | PRN
  Administered 2015-08-06: 50 ug via INTRAVENOUS
  Administered 2015-08-06 (×2): 100 ug via INTRAVENOUS
  Administered 2015-08-06: 50 ug via INTRAVENOUS

## 2015-08-06 MED ORDER — CEFAZOLIN SODIUM 1-5 GM-% IV SOLN
1.0000 g | Freq: Three times a day (TID) | INTRAVENOUS | Status: DC
  Administered 2015-08-06 – 2015-08-11 (×14): 1 g via INTRAVENOUS
  Filled 2015-08-06 (×16): qty 50

## 2015-08-06 MED ORDER — FENTANYL CITRATE (PF) 100 MCG/2ML IJ SOLN
25.0000 ug | INTRAMUSCULAR | Status: DC | PRN
  Administered 2015-08-06 (×2): 50 ug via INTRAVENOUS

## 2015-08-06 MED ORDER — PROPOFOL 10 MG/ML IV BOLUS
INTRAVENOUS | Status: DC | PRN
  Administered 2015-08-06: 200 mg via INTRAVENOUS

## 2015-08-06 MED ORDER — ROCURONIUM BROMIDE 100 MG/10ML IV SOLN
INTRAVENOUS | Status: AC
  Filled 2015-08-06: qty 1

## 2015-08-06 MED ORDER — CEFAZOLIN SODIUM-DEXTROSE 2-3 GM-% IV SOLR
2.0000 g | INTRAVENOUS | Status: AC
  Administered 2015-08-06: 2 g via INTRAVENOUS

## 2015-08-06 MED ORDER — LIDOCAINE HCL 1 % IJ SOLN
INTRAMUSCULAR | Status: AC
  Filled 2015-08-06: qty 20

## 2015-08-06 MED ORDER — LABETALOL HCL 5 MG/ML IV SOLN
INTRAVENOUS | Status: AC
  Filled 2015-08-06: qty 4

## 2015-08-06 MED ORDER — HYOSCYAMINE SULFATE 0.125 MG SL SUBL
0.1250 mg | SUBLINGUAL_TABLET | SUBLINGUAL | Status: DC | PRN
  Filled 2015-08-06: qty 1

## 2015-08-06 MED ORDER — DEXTROSE 50 % IV SOLN
INTRAVENOUS | Status: AC
  Administered 2015-08-06: 50 mL via INTRAVENOUS
  Filled 2015-08-06: qty 50

## 2015-08-06 MED ORDER — MAGNESIUM HYDROXIDE 400 MG/5ML PO SUSP: 30.0000 mL | Freq: Every day | ORAL | Status: DC | PRN

## 2015-08-06 MED ORDER — MIDAZOLAM HCL 2 MG/2ML IJ SOLN
INTRAMUSCULAR | Status: AC
  Filled 2015-08-06: qty 2

## 2015-08-06 MED ORDER — DAPAGLIFLOZIN PROPANEDIOL 10 MG PO TABS: 10.0000 mg | ORAL_TABLET | Freq: Every day | ORAL | Status: DC

## 2015-08-06 MED ORDER — MIDAZOLAM HCL 5 MG/5ML IJ SOLN
INTRAMUSCULAR | Status: DC | PRN
  Administered 2015-08-06: 2 mg via INTRAVENOUS

## 2015-08-06 MED ORDER — TAMSULOSIN HCL 0.4 MG PO CAPS
0.4000 mg | ORAL_CAPSULE | Freq: Every day | ORAL | Status: DC
  Administered 2015-08-06 – 2015-08-11 (×6): 0.4 mg via ORAL
  Filled 2015-08-06 (×6): qty 1

## 2015-08-06 MED ORDER — MIDAZOLAM HCL 2 MG/2ML IJ SOLN
INTRAMUSCULAR | Status: AC
  Filled 2015-08-06: qty 4

## 2015-08-06 MED ORDER — CEFAZOLIN SODIUM-DEXTROSE 2-3 GM-% IV SOLR
INTRAVENOUS | Status: AC
  Administered 2015-08-06: 2 g via INTRAVENOUS
  Filled 2015-08-06: qty 50

## 2015-08-06 MED ORDER — CICLOPIROX 0.77 % EX GEL: 1.0000 | Freq: Two times a day (BID) | CUTANEOUS | Status: DC | PRN

## 2015-08-06 MED ORDER — BISACODYL 10 MG RE SUPP
10.0000 mg | Freq: Every day | RECTAL | Status: DC | PRN
  Administered 2015-08-10: 10 mg via RECTAL
  Filled 2015-08-06: qty 1

## 2015-08-06 MED ORDER — SUGAMMADEX SODIUM 200 MG/2ML IV SOLN
INTRAVENOUS | Status: AC
  Filled 2015-08-06: qty 2

## 2015-08-06 MED ORDER — CLOTRIMAZOLE 1 % EX CREA: TOPICAL_CREAM | Freq: Two times a day (BID) | CUTANEOUS | Status: DC | PRN

## 2015-08-06 MED ORDER — HYDROMORPHONE HCL 1 MG/ML IJ SOLN
0.2500 mg | INTRAMUSCULAR | Status: DC | PRN
  Administered 2015-08-06 (×2): 0.5 mg via INTRAVENOUS

## 2015-08-06 SURGICAL SUPPLY — 54 items
BAG URINE DRAINAGE (UROLOGICAL SUPPLIES) ×2 IMPLANT
BASKET STONE NCOMPASS (UROLOGICAL SUPPLIES) ×2 IMPLANT
BASKET STONE NITINOL 3FRX115MB (UROLOGICAL SUPPLIES) ×2 IMPLANT
BASKET ZERO TIP NITINOL 2.4FR (BASKET) ×2 IMPLANT
BENZOIN TINCTURE PRP APPL 2/3 (GAUZE/BANDAGES/DRESSINGS) ×8 IMPLANT
BLADE SURG 15 STRL LF DISP TIS (BLADE) ×1 IMPLANT
BLADE SURG 15 STRL SS (BLADE) ×1
CARTRIDGE STONEBREAK CO2 KIDNE (ELECTROSURGICAL) IMPLANT
CATH AINSWORTH 30CC 24FR (CATHETERS) ×2 IMPLANT
CATH FOLEY LATEX FREE 16FR (CATHETERS) ×2 IMPLANT
CATH FOLEY LATEX FREE 22FR (CATHETERS) ×1
CATH FOLEY LF 22FR (CATHETERS) ×1 IMPLANT
CATH ROBINSON RED A/P 20FR (CATHETERS) IMPLANT
CATH URET 5FR 28IN OPEN ENDED (CATHETERS) IMPLANT
CATH URET DUAL LUMEN 6-10FR 50 (CATHETERS) IMPLANT
CATH UROLOGY TORQUE 40 (MISCELLANEOUS) ×2 IMPLANT
CATH X-FORCE N30 NEPHROSTOMY (TUBING) ×2 IMPLANT
COVER SURGICAL LIGHT HANDLE (MISCELLANEOUS) ×2 IMPLANT
DRAPE C-ARM 42X120 X-RAY (DRAPES) ×2 IMPLANT
DRAPE CAMERA CLOSED 9X96 (DRAPES) ×2 IMPLANT
DRAPE LINGEMAN PERC (DRAPES) ×2 IMPLANT
DRAPE SURG IRRIG POUCH 19X23 (DRAPES) ×2 IMPLANT
DRSG PAD ABDOMINAL 8X10 ST (GAUZE/BANDAGES/DRESSINGS) ×4 IMPLANT
DRSG TEGADERM 8X12 (GAUZE/BANDAGES/DRESSINGS) ×2 IMPLANT
FIBER LASER FLEXIVA 1000 (UROLOGICAL SUPPLIES) IMPLANT
FIBER LASER FLEXIVA 200 (UROLOGICAL SUPPLIES) IMPLANT
FIBER LASER FLEXIVA 365 (UROLOGICAL SUPPLIES) IMPLANT
FIBER LASER FLEXIVA 550 (UROLOGICAL SUPPLIES) IMPLANT
FIBER LASER TRAC TIP (UROLOGICAL SUPPLIES) IMPLANT
GAUZE SPONGE 4X4 12PLY STRL (GAUZE/BANDAGES/DRESSINGS) ×2 IMPLANT
GAUZE SPONGE 4X4 16PLY XRAY LF (GAUZE/BANDAGES/DRESSINGS) ×2 IMPLANT
GLOVE SURG SS PI 8.0 STRL IVOR (GLOVE) ×2 IMPLANT
GOWN STRL REUS W/TWL XL LVL3 (GOWN DISPOSABLE) ×2 IMPLANT
GUIDEWIRE AMPLAZ .035X145 (WIRE) ×4 IMPLANT
KIT BASIN OR (CUSTOM PROCEDURE TRAY) ×2 IMPLANT
MANIFOLD NEPTUNE II (INSTRUMENTS) ×2 IMPLANT
MASK EYE SHIELD (GAUZE/BANDAGES/DRESSINGS) ×2 IMPLANT
NS IRRIG 1000ML POUR BTL (IV SOLUTION) IMPLANT
PACK CYSTO (CUSTOM PROCEDURE TRAY) ×2 IMPLANT
PAD ABD 8X10 STRL (GAUZE/BANDAGES/DRESSINGS) ×2 IMPLANT
PROBE KIDNEY STONEBRKR 2.0X425 (ELECTROSURGICAL) IMPLANT
PROBE LITHOCLAST ULTRA 3.8X403 (UROLOGICAL SUPPLIES) IMPLANT
PROBE PNEUMATIC 1.0MMX570MM (UROLOGICAL SUPPLIES) IMPLANT
SET IRRIG Y TYPE TUR BLADDER L (SET/KITS/TRAYS/PACK) ×2 IMPLANT
SPONGE LAP 4X18 X RAY DECT (DISPOSABLE) ×2 IMPLANT
STONE CATCHER W/TUBE ADAPTER (UROLOGICAL SUPPLIES) IMPLANT
SUT SILK 2 0 30  PSL (SUTURE) ×1
SUT SILK 2 0 30 PSL (SUTURE) ×1 IMPLANT
SYR 20CC LL (SYRINGE) ×4 IMPLANT
SYRINGE 10CC LL (SYRINGE) ×2 IMPLANT
TOWEL OR 17X26 10 PK STRL BLUE (TOWEL DISPOSABLE) ×2 IMPLANT
TOWEL OR NON WOVEN STRL DISP B (DISPOSABLE) ×2 IMPLANT
TRAY FOLEY W/METER SILVER 16FR (SET/KITS/TRAYS/PACK) ×2 IMPLANT
TUBING CONNECTING 10 (TUBING) ×6 IMPLANT

## 2015-08-06 NOTE — Op Note (Signed)
NAMELOCKLAN, REILLEY                ACCOUNT NO.:  [id number]  MEDICAL RECORD NO.:  [id number]  LOCATION:                               FACILITY:  [facility]  PHYSICIAN:  Marshall Cork. Jeffie Pollock, M.D.    DATE OF BIRTH:  1951/06/21  DATE OF PROCEDURE:  08/06/2015 DATE OF DISCHARGE:  08/11/2015                              OPERATIVE REPORT   PROCEDURE:  Left percutaneous nephrolithotomy for greater than 2 cm stone.  PREOPERATIVE DIAGNOSIS:  Left partial staghorn stone.  POSTOPERATIVE DIAGNOSIS:  Left partial staghorn stone.  SURGEON:  Marshall Cork. Jeffie Pollock, M.D.  ANESTHESIA:  General.  SPECIMENS:  Stone fragments.  DRAINS:  Foley catheter and a 22-French silastic left nephrostomy tube with 6-French Kumpe safety catheter.  BLOOD LOSS:  Approximately 50 mL.  COMPLICATIONS:  None.  INDICATIONS:  Mr. Marine is a 65 year old white male with a history of urolithiasis who has a left lower pole partial staghorn stone that was felt to be too large for lithotripsy or ureteroscopic management and was appropriate for percutaneous nephrolithotomy.  FINDINGS AND PROCEDURE:  He was taken to Interventional Radiology earlier today, given 2 g of Ancef and a left mid to lower pole nephrostomy catheter was placed.  He was taken to the operating room where general anesthetic was induced on the holding room stretcher and a latex-free Foley catheter was inserted using sterile technique.  He was fitted with PAS hose.  He was then turned prone onto chest rolls with great care being taken to pad all pressure points and avoid manipulation of his insulin pump hardware.  His left percutaneous nephrostomy tube site was then prepped with ChloraPrep and he was draped in usual sterile fashion after a 3-minute wait.  The nephrostomy tube suture was clipped and the cap was removed from the nephrostomy tube.  A Amplatz superstiff wire was passed through the catheter to the bladder and the catheter was removed.  The catheter  was somewhat resistant to remove and it appeared that was at the skin level. A knife was then used to enlarge the incision at the skin to 2 cm and a dual-lumen catheter was then passed over wire into the distal ureter where a second Amplatz Super Stiff was placed into the bladder.  The dual-lumen catheter was removed and the NephroMax nephrostomy balloon catheter was then passed.  The balloon was inflated to 20 atmospheres and the sheath was then advanced into the renal pelvis.  The balloon was then deflated and removed.  There was no significant bleeding.  The rigid nephroscope was then inserted through the sheath.  Initially, I did not identify any stones, but with manipulation of the sheath I did manipulate the stone from the upper pole into the into the renal pelvis and it was removed with 2 prong graspers.  An additional stone from the lower pole calyx was removed in a similar fashion.  However, due to the location of the nephrostomy tract, I was unable to access several other stones in the upper and lower calyx with the rigid scope and it appeared that instead of a contiguous staghorn stone, he actually had several stones comprising what appeared to be a  staghorn stone.  A 19-French flexible nephroscope was then passed with saline under pressure.  This was manipulated into the upper pole where 2 large stones were removed and additional smaller stones were flushed into the renal pelvis and out through the nephrostomy sheath.  I then turned my attention to the lower pole where some moderate-size in the 5-6 mm range stones were grasped with a basket.  Initially, a 2 cm Nitinol basket was used, however, the smaller stones were removed with a NCompass basket. Once all visible stone material was removed, fluoroscopy revealed no obvious residual stones.  The flexible scope was removed and the rigid scope was passed one more time.  Once again, no additional stones were seen.  The sheath  was advanced back into the renal pelvis and the scope was removed.  A 22-French silastic Foley was prepped by using a hole punch on the tip.  The catheter was passed over the wire into the renal pelvis and a 6-French Kumpe catheter was then passed over the safety wire to the distal ureter.  The safety wire was removed.  The Foley in Kumpe catheters were then secured to the skin with 2-0 silk sutures. The nephrostomy sheath was cut away from the 22-French catheter and the working wire was removed.  A small amount of contrast was instilled to help determine placement of the nephrostomy tube initially.  It was too distal into the proximal ureter, so it was backed up a bit.  The balloon was filled with approximately 2 mL of fluid.  At this point, the catheter was placed to straight drainage.  The Kumpe catheter was capped.  The drapes were removed.  The wound was cleansed, a dressing of 4x4s, ABDs, and a Tegaderm was applied.  The patient was then rolled supine on the recovery room stretcher.  His anesthetic was reversed.  He was moved to recovery room in stable condition.  There were no complications.     Marshall Cork. Jeffie Pollock, M.D.     JJW/MEDQ  D:  08/06/2015  T:  08/06/2015  Job:  [id number]

## 2015-08-06 NOTE — H&P (Signed)
Chief Complaint: Patient was seen in consultation today for left nephroureteral catheter placement                                                                                                                                                       Referring Physician(s): Wrenn,John  History of Present Illness: Anthony Daniel. is a 65 y.o. male with prior history of bladder cancer and nephrolithiasis. He had placement of a right ureteral stent followed by right ureteroscopy for renal stones in October 2016. He now presents with left renal stones with partial staghorn stone in the left lower pole. He is scheduled today for left nephroureteral catheter placement prior to nephrolithotomy.   Past Medical History  Diagnosis Date  . Hyperlipidemia   . Hypertension   . History of bladder cancer     1998--  [facility]  . Nephrolithiasis     bilateral --  nonobstructive per CT  . History of kidney stones   . LADA (latent autoimmune diabetes in adults), managed as type 1 [facility])     Past Surgical History  Procedure Laterality Date  . Tonsillectomy  1975  . Laparoscopic appendectomy N/A 11/07/2012    Procedure: APPENDECTOMY LAPAROSCOPIC;  Surgeon: Harl Bowie, MD;  Location: [place];  Service: General;  Laterality: N/A;  . Cystoscopy with ureteroscopy and stent placement Right 04/23/2015    Procedure: CYSTOSCOPY WITH URETEROSCOPY RETROGRADE PYELOGRAM AND STENT PLACEMENT;  Surgeon: Cleon Gustin, MD;  Location: [facility];  Service: Urology;  Laterality: Right;  . Transurethral resection of bladder tumor  1998  . Extracorporeal shock wave lithotripsy  x2 in  2006//   x2  in 2012  . Cystoscopy/retrograde/ureteroscopy/stone extraction with basket Right 05/12/2015    Procedure: CYSTOSCOPY/RETROGRADE/URETEROSCOPY/STONE EXTRACTION WITH BASKET;  Surgeon: Cleon Gustin, MD;  [facility];  Service: Urology;  Laterality: Right;  . Cystoscopy w/ ureteral stent placement  Right 05/12/2015    Procedure: CYSTOSCOPY WITH STENT REPLACEMENT;  Surgeon: Cleon Gustin, MD;  Location[facility];  Service: Urology;  Laterality: Right;  . Holmium laser application Right [id number]    Procedure: HOLMIUM LASER APPLICATION;  Surgeon: Cleon Gustin, MD;  Location: [facility];  Service: Urology;  Laterality: Right;    Allergies: Crestor; Lipitor; Contrast media; Latex; Oxycodone; Adhesive; and Peanut-containing drug products  Medications: Prior to Admission medications   Medication Sig Start Date End Date Taking? Authorizing Provider  aspirin EC 81 MG tablet Take 81 mg by mouth daily.    Historical Provider, MD  Cholecalciferol (VITAMIN D) 2000 units tablet Take 2,000 Units by mouth daily.    Historical Provider, MD  Ciclopirox 0.77 % gel Apply 1 Act topically 2 (two) times daily. Patient taking differently: Apply 1 Act topically 2 (two) times  daily as needed (rash). Uses on ankle and feet 04/09/15   Janith Lima, MD  Cyanocobalamin 3000 MCG CAPS Take 3,000 mcg by mouth daily.    Historical Provider, MD  dapagliflozin propanediol (FARXIGA) 10 MG TABS tablet TAKE 1 TABLET BY MOUTH DAILY. Patient taking differently: Take 10 mg by mouth daily.  06/05/15   Janith Lima, MD  diphenhydrAMINE (BENADRYL) 50 MG tablet Take 50 mg by mouth at bedtime as needed for itching.    Historical Provider, MD  fluticasone (FLONASE) 50 MCG/ACT nasal spray Place 2 sprays into both nostrils daily. 07/17/15   Renee A Kuneff, DO  HYDROcodone-homatropine (HYCODAN) 5-1.5 MG/5ML syrup Take 5 mLs by mouth every 8 (eight) hours as needed for cough. 07/17/15   Golden Circle, FNP  Insulin Glargine (LANTUS SOLOSTAR So-Hi) Inject 50 Units into the skin 2 (two) times daily as needed. Only uses when pump goes out    Historical Provider, MD  Insulin Human (INSULIN PUMP) SOLN Inject 1 each into the skin continuous. Novolog insulin    Historical Provider, MD  Meloxicam  (VIVLODEX) 10 MG CAPS Take 1 capsule by mouth daily. Patient taking differently: Take 10 mg by mouth daily.  11/10/14   Janith Lima, MD  neomycin-polymyxin-hydrocortisone (CORTISPORIN) 3.5-10000-1 ophthalmic suspension 4 (four) times daily.    Historical Provider, MD  oxymetazoline (AFRIN) 0.05 % nasal spray Place 1 spray into both nostrils 2 (two) times daily as needed for congestion.    Historical Provider, MD  Pitavastatin Calcium (LIVALO) 2 MG TABS Take 2 mg by mouth daily.     Historical Provider, MD  Polyethyl Glycol-Propyl Glycol (SYSTANE OP) Place 1 drop into both eyes every 8 (eight) hours as needed (dry eyes).    Historical Provider, MD  Potassium Citrate 15 MEQ (1620 MG) TBCR Take 1 tablet by mouth 2 (two) times daily.    Historical Provider, MD  predniSONE (DELTASONE) 50 MG tablet Take 50 mg by mouth daily with breakfast.    Historical Provider, MD  ramipril (ALTACE) 10 MG tablet Take 10 mg by mouth daily before breakfast.     Historical Provider, MD  sildenafil (REVATIO) 20 MG tablet Take 20 mg by mouth daily as needed (erectile dysfunction).    Historical Provider, MD  tadalafil (CIALIS) 20 MG tablet Take 20 mg by mouth daily as needed for erectile dysfunction.    Historical Provider, MD  tamsulosin (FLOMAX) 0.4 MG CAPS capsule Take 1 capsule (0.4 mg total) by mouth daily. 04/22/15   Carmin Muskrat, MD  ZETIA 10 MG tablet TAKE 1 TABLET (10 MG TOTAL) BY MOUTH DAILY. 09/07/14   Janith Lima, MD     Family History  Problem Relation Age of Onset  . Cancer Other     Colon and Prostate Cancer  . Hypertension Other   . Diabetes Other   . Alcohol abuse Neg Hx   . Drug abuse Neg Hx   . Early death Neg Hx   . Heart disease Neg Hx   . Hyperlipidemia Neg Hx   . Kidney disease Neg Hx   . Stroke Neg Hx     Social History   Social History  . Marital Status: Married    Spouse Name: N/A  . Number of Children: N/A  . Years of Education: N/A   Social History Main Topics  .  Smoking status: Current Every Day Smoker -- 1.00 packs/day for 30 years    Types: Cigarettes  . Smokeless tobacco: Never  Used  . Alcohol Use: 3.0 oz/week    5 Shots of liquor per week     Comment: occasional  . Drug Use: No  . Sexual Activity: Yes   Other Topics Concern  . Not on file   Social History Narrative      Review of Systems  Constitutional: Negative for fever and chills.  Respiratory: Negative for cough and shortness of breath.   Cardiovascular: Negative for chest pain.  Gastrointestinal: Negative for nausea, vomiting, abdominal pain and blood in stool.  Genitourinary: Negative for dysuria.       Microscopic hematuria  Musculoskeletal: Negative for back pain.  Neurological: Negative for headaches.    Vital Signs: Blood pressure 148/76, temperature 98, heart rate 73, respirations 18, oxygen saturation 96% room air   Physical Exam  Constitutional: He is oriented to person, place, and time. He appears well-developed and well-nourished.  Cardiovascular: Normal rate and regular rhythm.   Pulmonary/Chest: Effort normal and breath sounds normal.  Abdominal: Soft. Bowel sounds are normal. There is no tenderness.  Musculoskeletal: Normal range of motion. He exhibits no edema.  Neurological: He is alert and oriented to person, place, and time.    Mallampati Score:     Imaging: No results found.  Labs:  CBC:  Recent Labs  04/24/15 0811 05/12/15 1305 06/23/15 1045 07/30/15 1120 08/06/15 0800  WBC 13.0*  --  10.8* 9.3 13.0*  HGB 14.3 17.0 15.5 15.4 14.7  HCT 44.6 50.0 46.4 46.5 44.8  PLT 164  --  169 168 193    COAGS:  Recent Labs  08/06/15 0800  INR 1.02  APTT 31    BMP:  Recent Labs  04/23/15 0754 04/24/15 0811 05/12/15 1305 06/23/15 1045 07/30/15 1120  NA 135 137 140 140 142  K 4.5 4.9 4.3 4.8 4.6  CL 103 109 104 106 107  CO2 25 22  --  27 26  GLUCOSE 167* 130* 113* 133* 117*  BUN 24* 17 12 13 17   CALCIUM 8.5* 8.3*  --  9.1 9.1    CREATININE 1.98* 1.16 1.00 1.00 0.98  GFRNONAA 34* >60  --  >60 >60  GFRAA 40* >60  --  >60 >60    LIVER FUNCTION TESTS:  Recent Labs  12/10/14 0958 04/23/15 0754  BILITOT 0.5 0.7  AST 17 23  ALT 20 22  ALKPHOS 89 90  PROT 7.0 7.1  ALBUMIN 4.1 4.0    TUMOR MARKERS: No results for input(s): AFPTM, CEA, CA199, CHROMGRNA in the last 8760 hours.  Assessment and Plan: 65 y.o. male with prior history of bladder cancer and nephrolithiasis. He had placement of a right ureteral stent followed by right ureteroscopy for renal stones in October 2016. He now presents with left renal stones with partial staghorn stone in the left lower pole. He is scheduled today for left nephroureteral catheter placement prior to nephrolithotomy. Risks and benefits discussed with the patient/wife including, but not limited to infection, bleeding, significant bleeding causing loss or decrease in renal function or damage to adjacent structures. All of the patient's questions were answered, patient is agreeable to proceed.Consent signed and in chart.      Thank you for this interesting consult.  I greatly enjoyed meeting Anthony Daniel. and look forward to participating in their care.  A copy of this report was sent to the requesting provider on this date.  Electronically Signed: D. Rowe Robert 08/06/2015, 8:53 AM   I spent a total of  15 minutes in face to face in clinical consultation, greater than 50% of which was counseling/coordinating care for left nephroureteral catheter placement

## 2015-08-06 NOTE — Interval H&P Note (Signed)
History and Physical Interval Note:  08/06/2015 11:26 AM  Anthony Daniel.  has presented today for surgery, with the diagnosis of LEFT RENAL STAGHORN STONE   The various methods of treatment have been discussed with the patient and family. After consideration of risks, benefits and other options for treatment, the patient has consented to  Procedure(s): 1ST STAGE  LEFT PERCUTANEOUS NEPHROLITHOTOMY  (Left) as a surgical intervention .  The patient's history has been reviewed, patient examined, no change in status, stable for surgery.  I have reviewed the patient's chart and labs.  Questions were answered to the patient's satisfaction.     Xyon Lukasik J

## 2015-08-06 NOTE — Progress Notes (Addendum)
Inpatient Diabetes Program Recommendations  AACE/ADA: New Consensus Statement on Inpatient Glycemic Control (2015)  Target Ranges:  Prepandial:   less than 140 mg/dL      Peak postprandial:   less than 180 mg/dL (1-2 hours)      Critically ill patients:  140 - 180 mg/dL   Results for Anthony Daniel, Anthony Daniel (MRN [id number]) as of 08/06/2015 15:12  Ref. Range 08/06/2015 07:54 08/06/2015 09:08 08/06/2015 09:29 08/06/2015 10:33 08/06/2015 11:17 08/06/2015 11:35 08/06/2015 12:46 08/06/2015 14:01  Glucose-Capillary Latest Ref Range: 65-99 mg/dL 189 (H) 117 (H) 113 (H) 248 (H) 194 (H) 169 (H) 224 (H) 230 (H)    Admit Percutaneous nephroureteral access/ Nephrolithotomy.   History: DM (LADA)  Endocrinologist: Dr. Delrae Rend [facility])  Home: Insulin Pump  Current Orders: Insulin Pump Q4 hours      -Saw pt in PACU with RN caring for patient.  Per RN, MD would like for patient to resume his normal insulin pump settings now that he is done with surgery.  -Patient very drowsy but easily awakes to voice.  Having pain but patient is oriented and able to manage insulin pump independently.  Discussed with patient that the MD would like for him (the patient) to resume his normal insulin pump settings.  Patient was using lower basal rate during surgery.  Patient was able to successfully return basal rates back to normal settings.  Upon review of settings (patient showed me his settings on the pump screen), settings are as follows as of 3pm:  Basal Rates:  12am- 2 units/hour 6am- 1.3 units/hour  Total Basal Insulin per 24 hour period= 35.4 units basal insulin  Carbohydrate Ratio= 1 unit for every 5 grams carbohydrates consumed  Correction/Sensitivity Factor= 1 unit for every 18 mg/dl above target CBG  Target CBG= 100 mg/dl   -I spoke with patient's wife in the waiting area and asked her to please review patient's insulin pump settings with patient once he gets to the floor to make sure all the settings  are correct.  Patient's wife told me she and her daughter will check patient's pump asap (per wife, daughter also uses [employer] insulin pump and can verify patient's settings as well).  -RN to check patient's CBGs Q4 hours with hospital meter and to document any/all insulin boluses given by patient.  Patient will Not need Novolog SSI orders after surgery as he can self-manage glucose levels with insulin pump.     --Will follow patient during hospitalization--  Wyn Quaker RN, MSN, CDE Diabetes Coordinator Inpatient Glycemic Control Team Team Pager: [phone] (8a-5p)

## 2015-08-06 NOTE — Anesthesia Postprocedure Evaluation (Signed)
Anesthesia Post Note  Patient: Anthony Daniel.  Procedure(s) Performed: Procedure(s) (LRB): 1ST STAGE  LEFT PERCUTANEOUS NEPHROLITHOTOMY  (Left)  Patient location during evaluation: PACU Anesthesia Type: General Level of consciousness: awake and alert Pain management: pain level controlled Vital Signs Assessment: post-procedure vital signs reviewed and stable Respiratory status: spontaneous breathing, nonlabored ventilation, respiratory function stable and patient connected to nasal cannula oxygen Cardiovascular status: blood pressure returned to baseline and stable Postop Assessment: no signs of nausea or vomiting Anesthetic complications: no    Last Vitals:  Filed Vitals:   08/06/15 1545 08/06/15 1600  BP: 149/76 153/76  Pulse: 72 71  Temp: 36.8 C 36.8 C  Resp: 15 16    Last Pain:  Filed Vitals:   08/06/15 1601  PainSc: 5                  Lennis Rader JENNETTE

## 2015-08-06 NOTE — Transfer of Care (Signed)
Immediate Anesthesia Transfer of Care Note  Patient: Anthony Daniel.  Procedure(s) Performed: Procedure(s): 1ST STAGE  LEFT PERCUTANEOUS NEPHROLITHOTOMY  (Left)  Patient Location: PACU  Anesthesia Type:General  Level of Consciousness: awake, alert , oriented and patient cooperative  Airway & Oxygen Therapy: Patient Spontanous Breathing and Patient connected to face mask oxygen  Post-op Assessment: Report given to RN, Post -op Vital signs reviewed and stable and Patient moving all extremities X 4  Post vital signs: stable  Last Vitals:  Filed Vitals:   08/06/15 0746  BP: 148/76  Pulse: 73  Temp: 36.7 C  Resp: 18    Complications: No apparent anesthesia complications

## 2015-08-06 NOTE — Anesthesia Preprocedure Evaluation (Addendum)
Anesthesia Evaluation  Patient identified by MRN, date of birth, ID band Patient awake    Reviewed: Allergy & Precautions, NPO status , Patient's Chart, lab work & pertinent test results  History of Anesthesia Complications Negative for: history of anesthetic complications  Airway Mallampati: II  TM Distance: >3 FB Neck ROM: Full    Dental no notable dental hx. (+) Dental Advisory Given, Poor Dentition, Missing   Pulmonary sleep apnea , Current Smoker,    Pulmonary exam normal breath sounds clear to auscultation       Cardiovascular hypertension, Pt. on medications Normal cardiovascular exam Rhythm:Regular Rate:Normal     Neuro/Psych negative neurological ROS  negative psych ROS   GI/Hepatic negative GI ROS, Neg liver ROS,   Endo/Other  diabetes, Insulin DependentInsulin pump. Will plan to leave insulin pump on night basal rate which is 1.3 units/hr. Current blood glucose 169. Basal rate during the day is usually 2 units/hr. Will monitor frequently and treat appropriately.   obesity  Renal/GU negative Renal ROS  negative genitourinary   Musculoskeletal  (+) Arthritis ,   Abdominal   Peds negative pediatric ROS (+)  Hematology negative hematology ROS (+)   Anesthesia Other Findings   Reproductive/Obstetrics negative OB ROS                           Anesthesia Physical Anesthesia Plan  ASA: III  Anesthesia Plan: General   Post-op Pain Management:    Induction: Intravenous  Airway Management Planned: Oral ETT  Additional Equipment:   Intra-op Plan:   Post-operative Plan: Extubation in OR  Informed Consent: I have reviewed the patients History and Physical, chart, labs and discussed the procedure including the risks, benefits and alternatives for the proposed anesthesia with the patient or authorized representative who has indicated his/her understanding and acceptance.    Dental advisory given  Plan Discussed with: CRNA  Anesthesia Plan Comments:         Anesthesia Quick Evaluation

## 2015-08-06 NOTE — Progress Notes (Signed)
Pt suspends insulin pump with CBG 117.  Rowe Robert, PA notified. Pt has insulin pump attached on left abd and blood sugar sensor device attached on right abd.

## 2015-08-06 NOTE — Progress Notes (Signed)
Patient ID: Reese Miland., male   DOB: Jul 03, 1951, 65 y.o.   MRN: [id number]  Mr. Burau is doing well post left PCNL.   I believe he is stone free but will get a CT in the morning to confirm.  If there is anything > 83mm we will need to consider a second look next Tuesday.    He has some expected pain.  His urine remains bloody.\  Hgb 14.7.   K is 5.6 so the potassium was removed from his IV.  BP 153/76 mmHg  Pulse 71  Temp(Src) 98.2 F (36.8 C) (Oral)  Resp 16  Ht 6\' 1"  (1.854 m)  Wt 106.142 kg (234 lb)  BMI 30.88 kg/m2  SpO2 100%   If the CT is stone free and his urine has cleared, the nephrostomy tube and foley can be removed in the morning prior to discharge.   If the urine is still bloody or there are residual stones requiring a second look he can be discharged with the NT, but the foley can be removed.

## 2015-08-06 NOTE — Anesthesia Procedure Notes (Signed)
Procedure Name: Intubation Date/Time: 08/06/2015 12:17 PM Performed by: Lissa Morales Pre-anesthesia Checklist: Patient identified, Emergency Drugs available, Suction available and Patient being monitored Patient Re-evaluated:Patient Re-evaluated prior to inductionOxygen Delivery Method: Circle System Utilized Preoxygenation: Pre-oxygenation with 100% oxygen Intubation Type: IV induction Ventilation: Mask ventilation without difficulty Laryngoscope Size: Mac and 4 Grade View: Grade II Tube type: Oral Tube size: 8.0 mm Number of attempts: 2 Airway Equipment and Method: Stylet,  Oral airway and Bougie stylet (anterior larynx) Placement Confirmation: ETT inserted through vocal cords under direct vision,  positive ETCO2 and breath sounds checked- equal and bilateral Secured at: 21 cm Tube secured with: Tape Dental Injury: Teeth and Oropharynx as per pre-operative assessment

## 2015-08-06 NOTE — Progress Notes (Signed)
On arrival to PACU, pt's CBG=230; Dr. Jillyn Hidden, anesthes, notified; Dr. Jillyn Hidden in to see pt at 1440; Diabetes nurse Georgian Co, RN in to se pt; recommendation for pt to go back to his routine daytime basal insulin rate; rate set at 2 u/hr and Ms. Rosebud Poles, RN assisted pt with set-up

## 2015-08-06 NOTE — Op Note (Deleted)
NAMEANFERNEE, OLA                ACCOUNT NO.:  [id number]  MEDICAL RECORD NO.:  [id number]  LOCATION:                               FACILITY:  [facility]  PHYSICIAN:  Marshall Cork. Jeffie Pollock, M.D.    DATE OF BIRTH:  03/01/1951  DATE OF PROCEDURE:  08/06/2015 DATE OF DISCHARGE:  08/11/2015                              OPERATIVE REPORT   PROCEDURE:  Left percutaneous nephrolithotomy for greater than 2 cm stone.  PREOPERATIVE DIAGNOSIS:  Left partial staghorn stone.  POSTOPERATIVE DIAGNOSIS:  Left partial staghorn stone.  SURGEON:  Marshall Cork. Jeffie Pollock, M.D.  ANESTHESIA:  General.  SPECIMENS:  Stone fragments.  DRAINS:  Foley catheter and a 22-French silastic left nephrostomy tube with 6-French Kumpe safety catheter.  BLOOD LOSS:  Approximately 50 mL.  COMPLICATIONS:  None.  INDICATIONS:  Mr. Solomon is a 65 year old white male with a history of urolithiasis who has a left lower pole partial staghorn stone that was felt to be too large for lithotripsy or ureteroscopic management and was appropriate for percutaneous nephrolithotomy.  FINDINGS AND PROCEDURE:  He was taken to Interventional Radiology earlier today, given 2 g of Ancef and a left mid to lower pole nephrostomy catheter was placed.  He was taken to the operating room where general anesthetic was induced on the holding room stretcher and a latex-free Foley catheter was inserted using sterile technique.  He was fitted with PAS hose.  He was then turned prone onto chest rolls with great care being taken to pad all pressure points and avoid manipulation of his insulin pump hardware.  His left percutaneous nephrostomy tube site was then prepped with ChloraPrep and he was draped in usual sterile fashion after a 3-minute wait.  The nephrostomy tube suture was clipped and the cap was removed from the nephrostomy tube.  A Amplatz superstiff wire was passed through the catheter to the bladder and the catheter was removed.  The catheter  was somewhat resistant to remove and it appeared that was at the skin level. A knife was then used to enlarge the incision at the skin to 2 cm and a dual-lumen catheter was then passed over wire into the distal ureter where a second Amplatz Super Stiff was placed into the bladder.  The dual-lumen catheter was removed and the NephroMax nephrostomy balloon catheter was then passed.  The balloon was inflated to 20 atmospheres and the sheath was then advanced into the renal pelvis.  The balloon was then deflated and removed.  There was no significant bleeding.  The rigid nephroscope was then inserted through the sheath.  Initially, I did not identify any stones, but with manipulation of the sheath I did manipulate the stone from the upper pole into the into the renal pelvis and it was removed with 2 prong graspers.  An additional stone from the lower pole calyx was removed in a similar fashion.  However, due to the location of the nephrostomy tract, I was unable to access several other stones in the upper and lower calyx with the rigid scope and it appeared that instead of a contiguous staghorn stone, he actually had several stones comprising what appeared to be a  staghorn stone.  A 19-French flexible nephroscope was then passed with saline under pressure.  This was manipulated into the upper pole where 2 large stones were removed and additional smaller stones were flushed into the renal pelvis and out through the nephrostomy sheath.  I then turned my attention to the lower pole where some moderate-size in the 5-6 mm range stones were grasped with a basket.  Initially, a 2 cm Nitinol basket was used, however, the smaller stones were removed with a NCompass basket. Once all visible stone material was removed, fluoroscopy revealed no obvious residual stones.  The flexible scope was removed and the rigid scope was passed one more time.  Once again, no additional stones were seen.  The sheath  was advanced back into the renal pelvis and the scope was removed.  A 22-French silastic Foley was prepped by using a hole punch on the tip.  The catheter was passed over the wire into the renal pelvis and a 6-French Kumpe catheter was then passed over the safety wire to the distal ureter.  The safety wire was removed.  The Foley in Kumpe catheters were then secured to the skin with 2-0 silk sutures. The nephrostomy sheath was cut away from the 22-French catheter and the working wire was removed.  A small amount of contrast was instilled to help determine placement of the nephrostomy tube initially.  It was too distal into the proximal ureter, so it was backed up a bit.  The balloon was filled with approximately 2 mL of fluid.  At this point, the catheter was placed to straight drainage.  The Kumpe catheter was capped.  The drapes were removed.  The wound was cleansed, a dressing of 4x4s, ABDs, and a Tegaderm was applied.  The patient was then rolled supine on the recovery room stretcher.  His anesthetic was reversed.  He was moved to recovery room in stable condition.  There were no complications.     Marshall Cork. Jeffie Pollock, M.D.     JJW/MEDQ  D:  08/06/2015  T:  08/06/2015  Job:  [id number]

## 2015-08-06 NOTE — Sedation Documentation (Signed)
Left nephroureteral catheter placed

## 2015-08-06 NOTE — Progress Notes (Signed)
Consulted Diabetes Coordinator Denny Levy)  for patient's insulin pump / Type I diabetic. Pt reports Insulin pump basal rate 2u/ hour continuous, remains "on."  DM coordinator recommends pump remain on as long as possible and be replaced asap post-op/ post-procedure.  Information shared with IR, anesthesia, and PACU staff. Pt's wife at bedside has replacement supplies.

## 2015-08-06 NOTE — Progress Notes (Signed)
Pt. Received to holding room with insulin pump connected but off at present.  Evaluated per Dr. Jillyn Hidden, anesthesiologist.  CBG performed by fingerstick, results 169mg /dl.  Insulin pump turned back on at 1.3u/hr. As ordered.  Will recheck CBGn within the hour and adjust accordingly/glucose as needed.  Pt. Agreeable to plan.

## 2015-08-06 NOTE — Discharge Instructions (Signed)
Percutaneous Nephrostomy, Care After Refer to this sheet in the next few weeks. These instructions provide you with information on caring for yourself after your procedure. Your health care provider may also give you more specific instructions. Your treatment has been planned according to current medical practices, but problems sometimes occur. Call your health care provider if you have any problems or questions after your procedure. WHAT TO EXPECT AFTER THE PROCEDURE You will need to remain lying down for several hours. HOME CARE INSTRUCTIONS  Your nephrostomy tube is connected to a leg bag or bedside drainage bag. Always keep the tubing, the leg bag, or the bedside drainage bags below the level of the kidney so that the urine drains freely.  During the day, if you are connecting the nephrostomy tube to a leg bag, be sure there are no kinks in the tubing and that the urine is draining freely.  At night, you may want to connect the nephrostomy tube or the leg bag to a larger bedside drainage bag.  Change the dressing as often as directed by your health care provider, or if it becomes wet.  Gently remove the tapes and dressing from around the nephrostomy tube. Be careful not to pull on the tube while removing the dressing.  Wash the skin around the tube, rinse well, and dry.  Place two split drain sponges in and around the tube exit site.  Place tape around edge of the dressing.  Secure the nephrostomy tubing. Remember to make certain that the nephrostomy tube does not kink or become pinched closed. It can be useful to wrap any exposed tubing going from the nephrostomy tube to any of the connecting tubes to either the leg bag or drainage bag with an elastic bandage.  Every three weeks, replace the leg bag, drainage bag, and any extension tubing connected to your nephrostomy tube. Your health care provider will explain how to change the drainage bag and extension tubing. SEEK MEDICAL CARE  IF:  You experience any problems with any of the valves or tubing.  You have persistent pain or soreness in your back.  You have a fever or chills. SEEK IMMEDIATE MEDICAL CARE IF:  You have abdominal pain during the first week.  You have a new appearance of blood in your urine.  You have back pain that is not relieved by your medicine.  You have drainage, redness, swelling, or pain at the tube insertion site.  You have decreased urine output.  Your nephrostomy tube comes out.   This information is not intended to replace advice given to you by your health care provider. Make sure you discuss any questions you have with your health care provider.   Document Released: 02/25/2004 Document Revised: 07/25/2014 Document Reviewed: 02/28/2013 Elsevier Interactive Patient Education 2016 [place] may resume meloxicam and ASA in 5 days.

## 2015-08-06 NOTE — Brief Op Note (Signed)
08/06/2015  1:34 PM  PATIENT:  Anthony Daniel.  65 y.o. male  PRE-OPERATIVE DIAGNOSIS:  LEFT RENAL STAGHORN STONE   POST-OPERATIVE DIAGNOSIS:  LEFT RENAL STAGHORN STONE   PROCEDURE:  Procedure(s): 1ST STAGE  LEFT PERCUTANEOUS NEPHROLITHOTOMY  (Left) >2cm  SURGEON:  Surgeon(s) and Role:    * Irine Seal, MD - Primary  PHYSICIAN ASSISTANT:   ASSISTANTS: none   ANESTHESIA:   general  EBL:  Total I/O In: 1000 [I.V.:1000] Out: 150 [Urine:150]  BLOOD ADMINISTERED:none  DRAINS: Urinary Catheter (Foley) and left 69fr silastic nephrostomy tube and 6 fr Kompe catheter   LOCAL MEDICATIONS USED:  NONE  SPECIMEN:  Source of Specimen:  left renal stones  DISPOSITION OF SPECIMEN:  to family to bring to office  COUNTS:  YES  TOURNIQUET:  * No tourniquets in log *  DICTATION: .Other Dictation: Dictation Number [id number]  PLAN OF CARE: Admit for overnight observation  PATIENT DISPOSITION:  PACU - hemodynamically stable.   Delay start of Pharmacological VTE agent (>24hrs) due to surgical blood loss or risk of bleeding: yes

## 2015-08-06 NOTE — Procedures (Signed)
Interventional Radiology Procedure Note  Procedure: Successful placement of LEFT sided percutaneous nephroureteral access via an inter to lower pole posterior calyx.  6F catheter down ureter and into bladder.   Complications: 0  Estimated Blood Loss: 0 mL  Recommendations: - to OR for PCNL   Signed,  Criselda Peaches, MD

## 2015-08-07 ENCOUNTER — Encounter (HOSPITAL_COMMUNITY): Payer: Self-pay | Admitting: Radiology

## 2015-08-07 ENCOUNTER — Observation Stay (HOSPITAL_COMMUNITY): Payer: BC Managed Care – PPO

## 2015-08-07 LAB — GLUCOSE, CAPILLARY
Glucose-Capillary: 231 mg/dL — ABNORMAL HIGH (ref 65–99)
Glucose-Capillary: 184 mg/dL — ABNORMAL HIGH (ref 65–99)
Glucose-Capillary: 174 mg/dL — ABNORMAL HIGH (ref 65–99)
Glucose-Capillary: 248 mg/dL — ABNORMAL HIGH (ref 65–99)
Glucose-Capillary: 180 mg/dL — ABNORMAL HIGH (ref 65–99)
Glucose-Capillary: 132 mg/dL — ABNORMAL HIGH (ref 65–99)

## 2015-08-07 LAB — BASIC METABOLIC PANEL
Anion gap: 8 (ref 5–15)
BUN: 14 mg/dL (ref 6–20)
CO2: 25 mmol/L (ref 22–32)
Calcium: 8.6 mg/dL — ABNORMAL LOW (ref 8.9–10.3)
Chloride: 102 mmol/L (ref 101–111)
Creatinine, Ser: 0.84 mg/dL (ref 0.61–1.24)
GFR calc Af Amer: >60 mL/min (ref >60)
GFR calc non Af Amer: >60 mL/min (ref >60)
Glucose, Bld: 211 mg/dL — ABNORMAL HIGH (ref 65–99)
Potassium: 4.5 mmol/L (ref 3.5–5.1)
Sodium: 135 mmol/L (ref 135–145)

## 2015-08-07 LAB — HEMOGLOBIN AND HEMATOCRIT, BLOOD
HCT: 42.5 % (ref 39.0–52.0)
Hemoglobin: 13.7 g/dL (ref 13.0–17.0)

## 2015-08-07 MED ORDER — PROMETHAZINE HCL 25 MG/ML IJ SOLN
12.5000 mg | Freq: Once | INTRAMUSCULAR | Status: AC
  Administered 2015-08-07: 12.5 mg via INTRAVENOUS
  Filled 2015-08-07: qty 1

## 2015-08-07 MED ORDER — HYDROCODONE-ACETAMINOPHEN 5-325 MG PO TABS: 1.0000 | ORAL_TABLET | ORAL | Status: DC | PRN

## 2015-08-07 MED ORDER — ONDANSETRON HCL 4 MG PO TABS: 4.0000 mg | ORAL_TABLET | Freq: Three times a day (TID) | ORAL | Status: DC | PRN

## 2015-08-07 NOTE — Progress Notes (Signed)
Spoke with Dr Jeffie Pollock and updated him on pt status, stated that he would cancel discharge home until tomorrow

## 2015-08-07 NOTE — Progress Notes (Addendum)
Inpatient Diabetes Program Recommendations  AACE/ADA: New Consensus Statement on Inpatient Glycemic Control (2015)  Target Ranges:  Prepandial:   less than 140 mg/dL      Peak postprandial:   less than 180 mg/dL (1-2 hours)      Critically ill patients:  140 - 180 mg/dL   Results for Anthony Daniel, Anthony Daniel (MRN [id number]) as of 08/07/2015 10:57  Ref. Range 08/07/2015 07:37  Glucose-Capillary Latest Ref Range: 65-99 mg/dL 184 (H)    Admit Percutaneous nephroureteral access/ Nephrolithotomy.   History: DM (LADA)  Endocrinologist: Dr. Delrae Rend [facility])  Home: Insulin Pump  Current Orders: Insulin Pump Q4 hours      -Patient A&O and able to manage insulin pump independently.  Changed CGM sensor this AM after CT scan.  -Pump currently on and running.  RNs obtaining CBGs Q4 hours.  Patient bolusing self with insulin pump as needed.  -Patient stated he has extra pump supplies at bedside if needed.  Having nausea this AM but receiving meds for this.  No questions or issues with his insulin pump at this time.  Patient very independent and knowledgeable with his pump.  -Reviewed documentation of pump with RN caring for patient today.      --Will follow patient during hospitalization--  Wyn Quaker RN, MSN, CDE Diabetes Coordinator Inpatient Glycemic Control Team Team Pager: [phone] (8a-5p)

## 2015-08-07 NOTE — Progress Notes (Addendum)
Called by RN caring for patient.  Patient's wife requested I see patient again today to make sure his insulin pump is "OK".  Patient still having nausea and pain.  CBG elevated to 248 mg/dl at 2pm.  Patient changed his insertion site around 2:30pm as a precaution.  Upon review of insulin pump, patient stated his 12am basal rate should be 1.3 units/hour and his 6am basal rate should be 2 units/hour.  Noted that patient had these two basal rates flip flopped.  Watched patient change his basal rates back to the normal rates as prescribed by his Endocrinologist (12am- 1.3 units/hour and 6am- 2 units/hour).   --Will follow patient during hospitalization--  Wyn Quaker RN, MSN, CDE Diabetes Coordinator Inpatient Glycemic Control Team Team Pager: [phone] (8a-5p)

## 2015-08-07 NOTE — Progress Notes (Signed)
Pt states he does not feel well, he feels "off", CBG 248, BP 131/53, notified urology office and am awaiting return call after MD is consulted, Diabetic nurse contacted per pt's wife request

## 2015-08-08 ENCOUNTER — Observation Stay (HOSPITAL_COMMUNITY): Payer: BC Managed Care – PPO

## 2015-08-08 ENCOUNTER — Encounter (HOSPITAL_COMMUNITY): Payer: Self-pay | Admitting: Radiology

## 2015-08-08 LAB — GLUCOSE, CAPILLARY
Glucose-Capillary: 146 mg/dL — ABNORMAL HIGH (ref 65–99)
Glucose-Capillary: 160 mg/dL — ABNORMAL HIGH (ref 65–99)
Glucose-Capillary: 144 mg/dL — ABNORMAL HIGH (ref 65–99)
Glucose-Capillary: 123 mg/dL — ABNORMAL HIGH (ref 65–99)
Glucose-Capillary: 167 mg/dL — ABNORMAL HIGH (ref 65–99)

## 2015-08-08 MED ORDER — MIDAZOLAM HCL 2 MG/2ML IJ SOLN
4.0000 mg | Freq: Once | INTRAMUSCULAR | Status: AC
  Administered 2015-08-08: 4 mg via INTRAVENOUS
  Filled 2015-08-08: qty 4

## 2015-08-08 MED ORDER — FENTANYL CITRATE (PF) 100 MCG/2ML IJ SOLN
100.0000 ug | Freq: Once | INTRAMUSCULAR | Status: AC
Start: 2015-08-08 — End: 2015-08-08
  Administered 2015-08-08: 100 ug via INTRAVENOUS
  Filled 2015-08-08: qty 2

## 2015-08-08 MED ORDER — FENTANYL CITRATE (PF) 100 MCG/2ML IJ SOLN
INTRAMUSCULAR | Status: AC | PRN
  Administered 2015-08-08: 25 ug via INTRAVENOUS
  Administered 2015-08-08: 50 ug via INTRAVENOUS

## 2015-08-08 MED ORDER — MIDAZOLAM HCL 2 MG/2ML IJ SOLN
INTRAMUSCULAR | Status: AC | PRN
  Administered 2015-08-08: 2 mg via INTRAVENOUS
  Administered 2015-08-08: 1 mg via INTRAVENOUS

## 2015-08-08 NOTE — Progress Notes (Signed)
Patient ID: Anthony Brouillette., male   DOB: March 19, 1951, 65 y.o.   MRN: [id number] 2 Days Post-Op  Subjective: Anthony Daniel had a CT earlier today for persistent pain that began when his tube was removed.   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  Anthony Daniel remains in pain this evening.   ROS:  Review of Systems  Constitutional: Negative for fever.  Gastrointestinal: Positive for nausea.    Anti-infectives: Anti-infectives    Start     Dose/Rate Route Frequency Ordered Stop   08/06/15 1800  ceFAZolin (ANCEF) IVPB 1 g/50 mL premix     1 g 100 mL/hr over 30 Minutes Intravenous 3 times per day 08/06/15 1612     08/06/15 0745  ciprofloxacin (CIPRO) IVPB 400 mg  Status:  Discontinued     400 mg 200 mL/hr over 60 Minutes Intravenous To Radiology 08/06/15 0744 08/06/15 1603      Current Facility-Administered Medications  Medication Dose Route Frequency Provider Last Rate Last Dose  . 0.45 % sodium chloride infusion   Intravenous Continuous Irine Seal, MD 100 mL/hr at 08/08/15 1206    . acetaminophen (TYLENOL) tablet 650 mg  650 mg Oral Q4H PRN Irine Seal, MD      . bisacodyl (DULCOLAX) suppository 10 mg  10 mg Rectal Daily PRN Irine Seal, MD      . ceFAZolin (ANCEF) IVPB 1 g/50 mL premix  1 g Intravenous 3 times per day Irine Seal, MD   1 g at 08/08/15 1323  . clotrimazole (LOTRIMIN) 1 % cream   Topical BID PRN Irine Seal, MD      . diphenhydrAMINE (BENADRYL) capsule 50 mg  50 mg Oral QHS PRN Irine Seal, MD      . ezetimibe (ZETIA) tablet 10 mg  10 mg Oral Daily Irine Seal, MD   10 mg at 08/08/15 0935  . fluticasone (FLONASE) 50 MCG/ACT nasal spray 2 spray  2 spray Each Nare Daily Irine Seal, MD   2 spray at 08/07/15 [phone]  . HYDROcodone-acetaminophen (NORCO/VICODIN) 5-325 MG per tablet 1-2 tablet  1-2 tablet Oral Q4H PRN Irine Seal, MD   1 tablet at 08/07/15 2225  . HYDROmorphone (DILAUDID) injection 0.5-1 mg  0.5-1  mg Intravenous Q2H PRN Irine Seal, MD   1 mg at 08/08/15 1705  . hyoscyamine (LEVSIN SL) SL tablet 0.125 mg  0.125 mg Sublingual Q4H PRN Irine Seal, MD      . insulin pump 1 each  1 each Subcutaneous Continuous Irine Seal, MD   1 each at 08/06/15 1754  . insulin pump   Subcutaneous 6 times per day Irine Seal, MD      . magnesium hydroxide (MILK OF MAGNESIA) suspension 30 mL  30 mL Oral Daily PRN Irine Seal, MD      . neomycin-polymyxin b-dexamethasone (MAXITROL) ophthalmic suspension 1 drop  1 drop Both Eyes 4 times per day Irine Seal, MD   1 drop at 08/07/15 1708  . ondansetron (ZOFRAN) injection 4 mg  4 mg Intravenous Q4H PRN Irine Seal, MD   4 mg at 08/07/15 1824  . oxymetazoline (AFRIN) 0.05 % nasal spray 1 spray  1 spray Each Nare BID PRN Irine Seal, MD      . polyvinyl alcohol (LIQUIFILM TEARS) 1.4 % ophthalmic solution 1 drop  1 drop Both Eyes PRN Irine Seal, MD      . ramipril (  ALTACE) capsule 10 mg  10 mg Oral Daily Irine Seal, MD   10 mg at 08/08/15 0935  . tamsulosin (FLOMAX) capsule 0.4 mg  0.4 mg Oral Daily Irine Seal, MD   0.4 mg at 08/08/15 0935  . zolpidem (AMBIEN) tablet 5 mg  5 mg Oral QHS PRN,MR X 1 Irine Seal, MD         Objective: Vital signs in last 24 hours: Temp:  [98.2 F (36.8 C)-98.5 F (36.9 C)] 98.2 F (36.8 C) (01/21 1338) Pulse Rate:  [73-95] 95 (01/21 1338) Resp:  [18-20] 18 (01/21 1338) BP: (143-179)/(59-84) 179/84 mmHg (01/21 1338) SpO2:  [96 %-98 %] 97 % (01/21 1338)  Intake/Output from previous day: 01/20 0701 - 01/21 0700 In: 306.7 [I.V.:256.7; IV Piggyback:50] Out: 375 [Urine:375] Intake/Output this shift: Total I/O In: 120 [P.O.:120] Out: 300 [Urine:300]   Physical Exam  Constitutional: He is well-developed, well-nourished, and in no distress.  Abdominal:  He has bulging of the left abdomen with tenderness and generalized distention.    Vitals reviewed.   Lab Results:   Recent Labs  08/06/15 0800 08/06/15 1502 08/07/15 0343  WBC  13.0*  --   --   HGB 14.7 14.7 13.7  HCT 44.8 43.8 42.5  PLT 193  --   --    BMET  Recent Labs  08/06/15 1440 08/07/15 0343  NA 136 135  K 5.6* 4.5  CL 105 102  CO2 21* 25  GLUCOSE 257* 211*  BUN 15 14  CREATININE 0.88 0.84  CALCIUM 8.4* 8.6*   PT/INR  Recent Labs  08/06/15 0800  LABPROT 13.6  INR 1.02   ABG No results for input(s): PHART, HCO3 in the last 72 hours.  Invalid input(s): PCO2, PO2  Studies/Results: Ct Abdomen Pelvis Wo Contrast  08/08/2015  CLINICAL DATA:  Lower left-sided abdominal pain and nausea. History of left nephrolithiasis, and recent removal of left nephrostomy tube. EXAM: CT ABDOMEN AND PELVIS WITHOUT CONTRAST TECHNIQUE: Multidetector CT imaging of the abdomen and pelvis was performed following the standard protocol without IV contrast. COMPARISON:  CT of the abdomen pelvis 08/07/2015 FINDINGS: Lower chest: There are hypoventilatory changes of bilateral lung bases, left more than right. There is mild pericardial thickening, maximum diameter of 6 mm. Calcific coronary artery disease is noted. Hepatobiliary: No mass visualized on this un-enhanced exam. Pancreas: No mass or inflammatory process identified on this un-enhanced exam. Spleen: Within normal limits in size. Adrenals/Urinary Tract: There has been interval removal of left percutaneous nephrostomy tube. There is mild left hydronephrosis and hydroureter. There is irregular thickening of the proximal left ureter, the mid left ureter is not well visualized. There has been interval development of water density intra-abdominal fluid collection. Moderate amount of retroperitoneal fluid is also seen along the left abdominal and pelvic wall, with significant collection anterior to the left psoas muscle. There are bilateral nonobstructing few mm renal calculi. Stomach/Bowel: No evidence of obstruction, inflammatory process, or abnormal fluid collections. Vascular/Lymphatic: No pathologically enlarged lymph  nodes. No evidence of abdominal aortic aneurysm. Mild atherosclerotic disease of the aorta and its main branches. Reproductive: No mass or other significant abnormality. Musculoskeletal:  No suspicious bone lesions identified. IMPRESSION: Status post removal of left percutaneous nephrostomy tube. Interval development of mild left hydronephrosis, moderate hydroureter, and large predominantly left sided intra-abdominal and retroperitoneal low-density fluid collection. Given patient's history, urine leak is of primary consideration. Bilateral nonobstructive nephrolithiasis. Left more than right lung base atelectasis. Interval development of mild pericardial thickening  or effusion. Atherosclerotic disease of the aorta and coronary arteries. These results will be called to the ordering clinician or representative by the Radiologist Assistant, and communication documented in the PACS or zVision Dashboard. Electronically Signed   By: Fidela Salisbury M.D.   On: 08/08/2015 11:59   Ct Abdomen Wo Contrast  08/07/2015  CLINICAL DATA:  Left flank pain right-sided nephrostomy, hematuria, postop day 1. Evaluate for residual stone. History of bladder cancer. EXAM: CT ABDOMEN WITHOUT CONTRAST TECHNIQUE: Multidetector CT imaging of the abdomen was performed following the standard protocol without IV contrast. COMPARISON:  CT abdomen pelvis dated 04/23/2015 FINDINGS: Lower chest: Mild patchy opacity in the posterior left lung base (series 4/image 13), likely rounded atelectasis. Additional mild dependent atelectasis at the right lung base. Hepatobiliary: Unenhanced liver is unremarkable. Gallbladder is notable for a layering gallstones in the gallbladder fundus (series 2/ image 33). No associated inflammatory changes. Pancreas: Within normal limits. Spleen: Within normal limits. Adrenals/Urinary Tract: Adrenal glands are within normal limits. Status post left nephrostomy with nephroureteral stent. 5-6 nonobstructing left renal  calculi, most of which measure 1-2 mm, with a 3 mm calculus in the posterior interpolar region (series 2/ image 30). No hydronephrosis. 12 mm left upper pole renal cyst (series 2/ image 24). Mild left perinephric stranding, likely postprocedural (series 2/image 44). Two nonobstructing right renal calculi measuring up to 5 mm in the right lower pole (series 2/ image 38). No hydronephrosis. Stomach/Bowel: Stomach is within normal limits. Visualized bowel is unremarkable. Vascular/Lymphatic: Atherosclerotic calcifications of the abdominal aorta and branch vessels. No suspicious abdominal lymphadenopathy. Other: No abdominal ascites. Musculoskeletal: No focal osseous lesions. IMPRESSION: Status post left nephrostomy with nephroureteral stent. 5-6 nonobstructing left renal calculi, measuring up to 3 mm in the interpolar region. Two nonobstructing right renal calculi measuring up to 5 mm in the right lower pole. No hydronephrosis. Cholelithiasis, without associated inflammatory changes. Electronically Signed   By: Julian Hy M.D.   On: 08/07/2015 08:25     Assessment and Plan: He has a large left urinoma with mild left hydro that is probably from ureteral edema from instrumentation or clot.     I have discussed his case with Dr. Kathlene Cote and have asked him to drain the urinoma.  With the mild degree of hydronephrosis, I don't believe he needs a ureteral stent at this time, but Dr. Kathlene Cote is going to review the films and then determine whether to just drain the urinoma or replace the nephrostomy.            Koralee Wedeking J 08/08/2015 [phone]

## 2015-08-08 NOTE — Procedures (Signed)
Interventional Radiology Procedure Note  Procedure:  CT guided left urinoma drain  Complications:  None  Estimated Blood Loss: < 10 mL  Pocket of urinoma inferior to left kidney targeted.  Aspiration yielded blood tinged, clear fluid. 12 Fr drain placed and attached to gravity bag.  Fluid does not appear well marginated by CT and will have to follow to see if one drain sufficient to treat the large urinoma.  Venetia Night. Kathlene Cote, M.D Pager:  [phone]

## 2015-08-08 NOTE — Progress Notes (Signed)
2 Days Post-Op Subjective: Patient reports fairly significant ongoing lower abdominal discomfort with nausea status post discontinuation of his nephrostomy tube. Stone protocol CT it shown 5-6 small stones in left kidney including a 3 x 5 mm stone. Nephrostomy tube was removed. It is unclear to me whether there was a trial of clamping before its removal. The patient has continued to experience ongoing discomfort has required parental narcotics.  Objective: Vital signs in last 24 hours: Temp:  [97.5 F (36.4 C)-98.5 F (36.9 C)] 98.5 F (36.9 C) (01/21 0617) Pulse Rate:  [70-77] 77 (01/21 0617) Resp:  [20] 20 (01/21 0617) BP: (131-160)/(53-78) 143/59 mmHg (01/21 0617) SpO2:  [96 %-98 %] 96 % (01/21 0617)  Intake/Output from previous day: 01/20 0701 - 01/21 0700 In: 306.7 [I.V.:256.7; IV Piggyback:50] Out: 375 [Urine:375] Intake/Output this shift:    Physical Exam:  Constitutional: Vital signs reviewed. WD WN in mild distress.  Eyes: PERRL, No scleral icterus.   Cardiovascular: RRR Pulmonary/Chest: Normal effort Abdominal: Soft. Minimal CVA tenderness. Mild to moderate lower abdominal left greater than right tenderness with slight abdominal distention. Genitourinary: Foley has been discontinued Extremities: No cyanosis or edema   Lab Results:  Recent Labs  08/06/15 0800 08/06/15 1502 08/07/15 0343  HGB 14.7 14.7 13.7  HCT 44.8 43.8 42.5   BMET  Recent Labs  08/06/15 1440 08/07/15 0343  NA 136 135  K 5.6* 4.5  CL 105 102  CO2 21* 25  GLUCOSE 257* 211*  BUN 15 14  CREATININE 0.88 0.84  CALCIUM 8.4* 8.6*    Recent Labs  08/06/15 0800  INR 1.02   No results for input(s): LABURIN in the last 72 hours. Results for orders placed or performed during the hospital encounter of 04/23/15  Surgical pcr screen     Status: None   Collection Time: 04/23/15 12:54 PM  Result Value Ref Range Status   MRSA, PCR NEGATIVE NEGATIVE Final   Staphylococcus aureus NEGATIVE  NEGATIVE Final    Comment:        The Xpert SA Assay (FDA approved for NASAL specimens in patients over 28 years of age), is one component of a comprehensive surveillance program.  Test performance has been validated by [facility] for patients greater than or equal to 46 year old. It is not intended to diagnose infection nor to guide or monitor treatment.   Urine culture     Status: None   Collection Time: 04/23/15  5:45 PM  Result Value Ref Range Status   Specimen Description CYSTOSCOPY URINE  Final   Special Requests NONE  Final   Culture   Final    NO GROWTH 1 DAY Performed at [facility]    Report Status 04/24/2015 FINAL  Final    Studies/Results: Ct Abdomen Wo Contrast  08/07/2015  CLINICAL DATA:  Left flank pain right-sided nephrostomy, hematuria, postop day 1. Evaluate for residual stone. History of bladder cancer. EXAM: CT ABDOMEN WITHOUT CONTRAST TECHNIQUE: Multidetector CT imaging of the abdomen was performed following the standard protocol without IV contrast. COMPARISON:  CT abdomen pelvis dated 04/23/2015 FINDINGS: Lower chest: Mild patchy opacity in the posterior left lung base (series 4/image 13), likely rounded atelectasis. Additional mild dependent atelectasis at the right lung base. Hepatobiliary: Unenhanced liver is unremarkable. Gallbladder is notable for a layering gallstones in the gallbladder fundus (series 2/ image 33). No associated inflammatory changes. Pancreas: Within normal limits. Spleen: Within normal limits. Adrenals/Urinary Tract: Adrenal glands are within normal limits. Status post  left nephrostomy with nephroureteral stent. 5-6 nonobstructing left renal calculi, most of which measure 1-2 mm, with a 3 mm calculus in the posterior interpolar region (series 2/ image 30). No hydronephrosis. 12 mm left upper pole renal cyst (series 2/ image 24). Mild left perinephric stranding, likely postprocedural (series 2/image 44). Two nonobstructing right renal  calculi measuring up to 5 mm in the right lower pole (series 2/ image 38). No hydronephrosis. Stomach/Bowel: Stomach is within normal limits. Visualized bowel is unremarkable. Vascular/Lymphatic: Atherosclerotic calcifications of the abdominal aorta and branch vessels. No suspicious abdominal lymphadenopathy. Other: No abdominal ascites. Musculoskeletal: No focal osseous lesions. IMPRESSION: Status post left nephrostomy with nephroureteral stent. 5-6 nonobstructing left renal calculi, measuring up to 3 mm in the interpolar region. Two nonobstructing right renal calculi measuring up to 5 mm in the right lower pole. No hydronephrosis. Cholelithiasis, without associated inflammatory changes. Electronically Signed   By: Julian Hy M.D.   On: 08/07/2015 08:25   Dg C-arm 1-60 Min-no Report  08/06/2015  CLINICAL DATA: surgery C-ARM 1-60 MINUTES Fluoroscopy was utilized by the requesting physician.  No radiographic interpretation.   Ir Ureteral Stent Left New Access W/o Sep Nephrostomy Cath  08/06/2015  CLINICAL DATA:  Renal stones, access for left percutaneous nephrolithotomy. EXAM: IR URETERAL STENT LEFT NEW ACCESS W/O SEP NEPHROSTOMY CATH Date: 08/06/2015 PROCEDURE: 1. Percutaneous puncture of the renal collecting system under fluoroscopic guidance 2. Placement of a percutaneous nephrostomy tube Operating Physician:  Criselda Peaches, MD COMPARISON:  CT of the abdomen and pelvis - 04/23/2015 ANESTHESIA/SEDATION: Moderate (conscious) sedation was used. 4 mg Versed, 175 mcg Fentanyl were administered intravenously. The patient's vital signs were monitored continuously by radiology nursing throughout the procedure. 400 mg Cipro was administered IV at an appropriate time prior to skin puncture. Sedation Time: 31 minutes FLUOROSCOPY TIME:  10 minutes 6 seconds for a total of 471 mGy CONTRAST:  20 mL instilled into the collecting system. TECHNIQUE: Informed written consent was obtained from the patient after a  discussion of the risks, benefits, and alternatives to treatment. The left flank region was prepped with Betadine in a sterile fashion, and a sterile drape was applied covering the operative field. A sterile gown and sterile gloves were used for the procedure. A timeout was performed prior to the initiation of the procedure. A pre procedural spot fluoroscopic image was obtained of the upper abdomen. Ultrasound scanning performed of the kidney was negative for significant hydronephrosis. As such, the stone within an inter to lower pole posterior calyx was targeted fluoroscopically with a Wheaton needle. Access to the collecting system was confirmed with aspiration of clear urine followed by injection of contrast material. A Nitrex wire was then advanced into the collecting system. The needle was exchanged for the Accustick set and additional contrast injection confirmed access. A small amount of air was injected into the collecting system to demonstrate access in the posterior calyx. Using a glidewire an angled 4 French catheter, the wire was successfully navigated down the ureter and into the bladder. The 4 French catheter and Accustick sheath were removed. A 5 [place] Kumpe the catheter was advanced over the wire. The Kumpe catheter was advanced down the ureter and into the urinary bladder. Postprocedural spot radiographs were obtained in various obliquities and the catheter was sutured to the skin. The catheter was capped and a dressing was placed. The patient tolerated the procedure well without immediate postprocedural complication. COMPLICATIONS: None immediate IMPRESSION: Successful fluoroscopic guided left percutaneous  nephrostomy with placement of a 5 French Kumpe catheter to the level of the urinary bladder to be utilized during impending nephrolithotomy procedure. Signed, Criselda Peaches, MD [facility] Electronically Signed   By: Jacqulynn Cadet M.D.   On: 08/06/2015 11:14    Assessment/Plan:   Initial plan was for the patient to go home yesterday. He has had ongoing and fairly severe pain at times along with severe nausea and lower abdominal discomfort status post removal of his nephrostomy tube. The patient may indeed have a ureteral stone or fragment at this point. We talked about 24 hours of ongoing observation but the patient is Y for like to know more definitively. For that reason I will repeat a stone protocol CT at this time.     Anthony Daniel S 08/08/2015, 8:34 AM

## 2015-08-09 DIAGNOSIS — Z885 Allergy status to narcotic agent status: Secondary | ICD-10-CM | POA: Diagnosis not present

## 2015-08-09 DIAGNOSIS — N400 Enlarged prostate without lower urinary tract symptoms: Secondary | ICD-10-CM | POA: Diagnosis present

## 2015-08-09 DIAGNOSIS — Z8551 Personal history of malignant neoplasm of bladder: Secondary | ICD-10-CM | POA: Diagnosis not present

## 2015-08-09 DIAGNOSIS — Z91010 Allergy to peanuts: Secondary | ICD-10-CM | POA: Diagnosis not present

## 2015-08-09 DIAGNOSIS — Z79899 Other long term (current) drug therapy: Secondary | ICD-10-CM | POA: Diagnosis not present

## 2015-08-09 DIAGNOSIS — I2510 Atherosclerotic heart disease of native coronary artery without angina pectoris: Secondary | ICD-10-CM | POA: Diagnosis present

## 2015-08-09 DIAGNOSIS — F17210 Nicotine dependence, cigarettes, uncomplicated: Secondary | ICD-10-CM | POA: Diagnosis present

## 2015-08-09 DIAGNOSIS — I10 Essential (primary) hypertension: Secondary | ICD-10-CM | POA: Diagnosis present

## 2015-08-09 DIAGNOSIS — Z794 Long term (current) use of insulin: Secondary | ICD-10-CM | POA: Diagnosis not present

## 2015-08-09 DIAGNOSIS — N5201 Erectile dysfunction due to arterial insufficiency: Secondary | ICD-10-CM | POA: Diagnosis present

## 2015-08-09 DIAGNOSIS — N132 Hydronephrosis with renal and ureteral calculous obstruction: Secondary | ICD-10-CM | POA: Diagnosis present

## 2015-08-09 DIAGNOSIS — N368 Other specified disorders of urethra: Secondary | ICD-10-CM | POA: Diagnosis present

## 2015-08-09 DIAGNOSIS — Z888 Allergy status to other drugs, medicaments and biological substances status: Secondary | ICD-10-CM | POA: Diagnosis not present

## 2015-08-09 DIAGNOSIS — E785 Hyperlipidemia, unspecified: Secondary | ICD-10-CM | POA: Diagnosis present

## 2015-08-09 DIAGNOSIS — Z91041 Radiographic dye allergy status: Secondary | ICD-10-CM | POA: Diagnosis not present

## 2015-08-09 DIAGNOSIS — Z7982 Long term (current) use of aspirin: Secondary | ICD-10-CM | POA: Diagnosis not present

## 2015-08-09 DIAGNOSIS — I771 Stricture of artery: Secondary | ICD-10-CM | POA: Diagnosis present

## 2015-08-09 DIAGNOSIS — Z91040 Latex allergy status: Secondary | ICD-10-CM | POA: Diagnosis not present

## 2015-08-09 LAB — GLUCOSE, CAPILLARY
Glucose-Capillary: 143 mg/dL — ABNORMAL HIGH (ref 65–99)
Glucose-Capillary: 145 mg/dL — ABNORMAL HIGH (ref 65–99)
Glucose-Capillary: 159 mg/dL — ABNORMAL HIGH (ref 65–99)
Glucose-Capillary: 180 mg/dL — ABNORMAL HIGH (ref 65–99)
Glucose-Capillary: 188 mg/dL — ABNORMAL HIGH (ref 65–99)
Glucose-Capillary: 191 mg/dL — ABNORMAL HIGH (ref 65–99)
Glucose-Capillary: 218 mg/dL — ABNORMAL HIGH (ref 65–99)

## 2015-08-09 NOTE — Progress Notes (Signed)
3 Days Post-Op Subjective: Patient reports feeling better this morning status post percutaneous drainage of urinoma. He still has some discomfort but it has decreased significantly. He remains afebrile. Urine output overnight was 525 mL. Voided urine is dark tea color suggesting that there is drainage from the left kidney to the bladder. Urinoma drainage was 275 mL of dark bloody fluid.  Objective: Vital signs in last 24 hours: Temp:  [98.2 F (36.8 C)-99 F (37.2 C)] 98.7 F (37.1 C) (01/22 0433) Pulse Rate:  [93-106] 95 (01/22 0433) Resp:  [18-28] 20 (01/22 0433) BP: (143-179)/(66-85) 168/85 mmHg (01/22 0433) SpO2:  [95 %-97 %] 96 % (01/22 0433)  Intake/Output from previous day: 01/21 0701 - 01/22 0700 In: 2270 [P.O.:120; I.V.:1890; IV Piggyback:250] Out: 1400 [Urine:1125; Drains:275] Intake/Output this shift:    Physical Exam:  Constitutional: Vital signs reviewed. WD WN in NAD   Eyes: PERRL, No scleral icterus.   Cardiovascular: RRR Pulmonary/Chest: Normal effort Abdominal: Soft. Minimal distention consistent with mild ileus Genitourinary: No change Extremities: No cyanosis or edema   Lab Results:  Recent Labs  08/06/15 1502 08/07/15 0343  HGB 14.7 13.7  HCT 43.8 42.5   BMET  Recent Labs  08/06/15 1440 08/07/15 0343  NA 136 135  K 5.6* 4.5  CL 105 102  CO2 21* 25  GLUCOSE 257* 211*  BUN 15 14  CREATININE 0.88 0.84  CALCIUM 8.4* 8.6*   No results for input(s): LABPT, INR in the last 72 hours. No results for input(s): LABURIN in the last 72 hours. Results for orders placed or performed during the hospital encounter of 04/23/15  Surgical pcr screen     Status: None   Collection Time: 04/23/15 12:54 PM  Result Value Ref Range Status   MRSA, PCR NEGATIVE NEGATIVE Final   Staphylococcus aureus NEGATIVE NEGATIVE Final    Comment:        The Xpert SA Assay (FDA approved for NASAL specimens in patients over 73 years of age), is one component of a  comprehensive surveillance program.  Test performance has been validated by [facility] for patients greater than or equal to 37 year old. It is not intended to diagnose infection nor to guide or monitor treatment.   Urine culture     Status: None   Collection Time: 04/23/15  5:45 PM  Result Value Ref Range Status   Specimen Description CYSTOSCOPY URINE  Final   Special Requests NONE  Final   Culture   Final    NO GROWTH 1 DAY Performed at [facility]    Report Status 04/24/2015 FINAL  Final    Studies/Results: Ct Abdomen Pelvis Wo Contrast  08/08/2015  CLINICAL DATA:  Lower left-sided abdominal pain and nausea. History of left nephrolithiasis, and recent removal of left nephrostomy tube. EXAM: CT ABDOMEN AND PELVIS WITHOUT CONTRAST TECHNIQUE: Multidetector CT imaging of the abdomen and pelvis was performed following the standard protocol without IV contrast. COMPARISON:  CT of the abdomen pelvis 08/07/2015 FINDINGS: Lower chest: There are hypoventilatory changes of bilateral lung bases, left more than right. There is mild pericardial thickening, maximum diameter of 6 mm. Calcific coronary artery disease is noted. Hepatobiliary: No mass visualized on this un-enhanced exam. Pancreas: No mass or inflammatory process identified on this un-enhanced exam. Spleen: Within normal limits in size. Adrenals/Urinary Tract: There has been interval removal of left percutaneous nephrostomy tube. There is mild left hydronephrosis and hydroureter. There is irregular thickening of the proximal left ureter, the mid  left ureter is not well visualized. There has been interval development of water density intra-abdominal fluid collection. Moderate amount of retroperitoneal fluid is also seen along the left abdominal and pelvic wall, with significant collection anterior to the left psoas muscle. There are bilateral nonobstructing few mm renal calculi. Stomach/Bowel: No evidence of obstruction, inflammatory  process, or abnormal fluid collections. Vascular/Lymphatic: No pathologically enlarged lymph nodes. No evidence of abdominal aortic aneurysm. Mild atherosclerotic disease of the aorta and its main branches. Reproductive: No mass or other significant abnormality. Musculoskeletal:  No suspicious bone lesions identified. IMPRESSION: Status post removal of left percutaneous nephrostomy tube. Interval development of mild left hydronephrosis, moderate hydroureter, and large predominantly left sided intra-abdominal and retroperitoneal low-density fluid collection. Given patient's history, urine leak is of primary consideration. Bilateral nonobstructive nephrolithiasis. Left more than right lung base atelectasis. Interval development of mild pericardial thickening or effusion. Atherosclerotic disease of the aorta and coronary arteries. These results will be called to the ordering clinician or representative by the Radiologist Assistant, and communication documented in the PACS or zVision Dashboard. Electronically Signed   By: Fidela Salisbury M.D.   On: 08/08/2015 11:59    Assessment/Plan:   Clinically improved status post drainage of his urinoma. We'll be Today for monitoring of output. I'm encouraged that his voided urine is bloody suggesting probable drainage of the left kidney down to the bladder. We'll order labs or him tomorrow morning. We'll encourage ongoing ambulation.     Talisha Erby S 08/09/2015, 8:45 AM

## 2015-08-10 LAB — GLUCOSE, CAPILLARY
Glucose-Capillary: 107 mg/dL — ABNORMAL HIGH (ref 65–99)
Glucose-Capillary: 112 mg/dL — ABNORMAL HIGH (ref 65–99)
Glucose-Capillary: 114 mg/dL — ABNORMAL HIGH (ref 65–99)
Glucose-Capillary: 122 mg/dL — ABNORMAL HIGH (ref 65–99)
Glucose-Capillary: 149 mg/dL — ABNORMAL HIGH (ref 65–99)
Glucose-Capillary: 157 mg/dL — ABNORMAL HIGH (ref 65–99)

## 2015-08-10 LAB — CBC
HCT: 40.2 % (ref 39.0–52.0)
Hemoglobin: 13.5 g/dL (ref 13.0–17.0)
MCH: 29.8 pg (ref 26.0–34.0)
MCHC: 33.6 g/dL (ref 30.0–36.0)
MCV: 88.7 fL (ref 78.0–100.0)
Platelets: 167 10*3/uL (ref 150–400)
RBC: 4.53 MIL/uL (ref 4.22–5.81)
RDW: 14.9 % (ref 11.5–15.5)
WBC: 21.2 10*3/uL — ABNORMAL HIGH (ref 4.0–10.5)

## 2015-08-10 LAB — BASIC METABOLIC PANEL
Anion gap: 10 (ref 5–15)
BUN: 12 mg/dL (ref 6–20)
CO2: 24 mmol/L (ref 22–32)
Calcium: 8.3 mg/dL — ABNORMAL LOW (ref 8.9–10.3)
Chloride: 101 mmol/L (ref 101–111)
Creatinine, Ser: 0.82 mg/dL (ref 0.61–1.24)
GFR calc Af Amer: >60 mL/min (ref >60)
GFR calc non Af Amer: >60 mL/min (ref >60)
Glucose, Bld: 116 mg/dL — ABNORMAL HIGH (ref 65–99)
Potassium: 3.9 mmol/L (ref 3.5–5.1)
Sodium: 135 mmol/L (ref 135–145)

## 2015-08-10 MED ORDER — HYDROCODONE-ACETAMINOPHEN 10-325 MG PO TABS
1.0000 | ORAL_TABLET | ORAL | Status: DC | PRN
Start: 2015-08-10 — End: 2015-08-11
  Administered 2015-08-10 – 2015-08-11 (×5): 1 via ORAL
  Filled 2015-08-10 (×5): qty 1

## 2015-08-10 NOTE — Progress Notes (Addendum)
[name]., male   DOB: 1950-08-18, 65 y.o.   MRN: [id number] 4 Days Post-Op  Subjective: Mr. Gaye is feeling a lot better post drain placement.  The drainage was 655ml yesterday but he reports that it is declining today.   He his afebrile but his WBC count is up to 21K.  He is voiding and the urine is clearing.  ROS:  Review of Systems  Constitutional: Negative for fever and chills.  Respiratory: Negative.   Cardiovascular: Negative.   Gastrointestinal: Positive for abdominal pain.  Musculoskeletal: Negative.     Anti-infectives: Anti-infectives    Start     Dose/Rate Route Frequency Ordered Stop   08/06/15 1800  ceFAZolin (ANCEF) IVPB 1 g/50 mL premix     1 g 100 mL/hr over 30 Minutes Intravenous 3 times per day 08/06/15 1612     08/06/15 0745  ciprofloxacin (CIPRO) IVPB 400 mg  Status:  Discontinued     400 mg 200 mL/hr over 60 Minutes Intravenous To Radiology 08/06/15 0744 08/06/15 1603      Current Facility-Administered Medications  Medication Dose Route Frequency Provider Last Rate Last Dose  . 0.45 % sodium chloride infusion   Intravenous Continuous Irine Seal, MD 100 mL/hr at 08/10/15 0409    . acetaminophen (TYLENOL) tablet 650 mg  650 mg Oral Q4H PRN Irine Seal, MD      . bisacodyl (DULCOLAX) suppository 10 mg  10 mg Rectal Daily PRN Irine Seal, MD      . ceFAZolin (ANCEF) IVPB 1 g/50 mL premix  1 g Intravenous 3 times per day Irine Seal, MD   1 g at 08/10/15 0540  . clotrimazole (LOTRIMIN) 1 % cream   Topical BID PRN Irine Seal, MD      . diphenhydrAMINE (BENADRYL) capsule 50 mg  50 mg Oral QHS PRN Irine Seal, MD      . ezetimibe (ZETIA) tablet 10 mg  10 mg Oral Daily Irine Seal, MD   10 mg at 08/09/15 0916  . fluticasone (FLONASE) 50 MCG/ACT nasal spray 2 spray  2 spray Each Nare Daily Irine Seal, MD   2 spray at 08/07/15 [phone]  . HYDROcodone-acetaminophen (NORCO/VICODIN) 5-325 MG per tablet 1-2 tablet  1-2 tablet Oral Q4H PRN Irine Seal, MD   2 tablet  at 08/10/15 0409  . HYDROmorphone (DILAUDID) injection 0.5-1 mg  0.5-1 mg Intravenous Q2H PRN Irine Seal, MD   1 mg at 08/09/15 0917  . hyoscyamine (LEVSIN SL) SL tablet 0.125 mg  0.125 mg Sublingual Q4H PRN Irine Seal, MD      . insulin pump 1 each  1 each Subcutaneous Continuous Irine Seal, MD   1 each at 08/06/15 1754  . insulin pump   Subcutaneous 6 times per day Irine Seal, MD   1 each at 08/09/15 2029  . magnesium hydroxide (MILK OF MAGNESIA) suspension 30 mL  30 mL Oral Daily PRN Irine Seal, MD      . neomycin-polymyxin b-dexamethasone (MAXITROL) ophthalmic suspension 1 drop  1 drop Both Eyes 4 times per day Irine Seal, MD   1 drop at 08/07/15 1708  . ondansetron (ZOFRAN) injection 4 mg  4 mg Intravenous Q4H PRN Irine Seal, MD   4 mg at 08/09/15 0323  . oxymetazoline (AFRIN) 0.05 % nasal spray 1 spray  1 spray Each Nare BID PRN Irine Seal, MD      . polyvinyl alcohol (LIQUIFILM TEARS) 1.4 % ophthalmic solution 1 drop  1 drop  Both Eyes PRN Irine Seal, MD      . ramipril (ALTACE) capsule 10 mg  10 mg Oral Daily Irine Seal, MD   10 mg at 08/09/15 0916  . tamsulosin (FLOMAX) capsule 0.4 mg  0.4 mg Oral Daily Irine Seal, MD   0.4 mg at 08/09/15 0916  . zolpidem (AMBIEN) tablet 5 mg  5 mg Oral QHS PRN,MR X 1 Irine Seal, MD         Objective: Vital signs in last 24 hours: Temp:  [98.3 F (36.8 C)-98.6 F (37 C)] 98.6 F (37 C) (01/23 0424) Pulse Rate:  [94-99] 99 (01/23 0424) Resp:  [18-24] 20 (01/23 0424) BP: (138-160)/(78-80) 138/80 mmHg (01/23 0424) SpO2:  [93 %-96 %] 96 % (01/23 0424)  Intake/Output from previous day: 01/22 0701 - 01/23 0700 In: 2685 [P.O.:420; I.V.:2115; IV Piggyback:150] Out: 600 [Drains:600] Intake/Output this shift:     Physical Exam  Constitutional: He is well-developed, well-nourished, and in no distress.  Cardiovascular: Normal rate and regular rhythm.   Pulmonary/Chest: Effort normal. No respiratory distress.  Abdominal:  Perc drain is draining  bloody urine  Musculoskeletal: Normal range of motion. He exhibits no edema or tenderness.    Lab Results:   Recent Labs  08/10/15 0440  WBC 21.2*  HGB 13.5  HCT 40.2  PLT 167   BMET  Recent Labs  08/10/15 0440  NA 135  K 3.9  CL 101  CO2 24  GLUCOSE 116*  BUN 12  CREATININE 0.82  CALCIUM 8.3*   PT/INR No results for input(s): LABPROT, INR in the last 72 hours. ABG No results for input(s): PHART, HCO3 in the last 72 hours.  Invalid input(s): PCO2, PO2  Studies/Results: Ct Abdomen Pelvis Wo Contrast  08/08/2015  CLINICAL DATA:  Lower left-sided abdominal pain and nausea. History of left nephrolithiasis, and recent removal of left nephrostomy tube. EXAM: CT ABDOMEN AND PELVIS WITHOUT CONTRAST TECHNIQUE: Multidetector CT imaging of the abdomen and pelvis was performed following the standard protocol without IV contrast. COMPARISON:  CT of the abdomen pelvis 08/07/2015 FINDINGS: Lower chest: There are hypoventilatory changes of bilateral lung bases, left more than right. There is mild pericardial thickening, maximum diameter of 6 mm. Calcific coronary artery disease is noted. Hepatobiliary: No mass visualized on this un-enhanced exam. Pancreas: No mass or inflammatory process identified on this un-enhanced exam. Spleen: Within normal limits in size. Adrenals/Urinary Tract: There has been interval removal of left percutaneous nephrostomy tube. There is mild left hydronephrosis and hydroureter. There is irregular thickening of the proximal left ureter, the mid left ureter is not well visualized. There has been interval development of water density intra-abdominal fluid collection. Moderate amount of retroperitoneal fluid is also seen along the left abdominal and pelvic wall, with significant collection anterior to the left psoas muscle. There are bilateral nonobstructing few mm renal calculi. Stomach/Bowel: No evidence of obstruction, inflammatory process, or abnormal fluid  collections. Vascular/Lymphatic: No pathologically enlarged lymph nodes. No evidence of abdominal aortic aneurysm. Mild atherosclerotic disease of the aorta and its main branches. Reproductive: No mass or other significant abnormality. Musculoskeletal:  No suspicious bone lesions identified. IMPRESSION: Status post removal of left percutaneous nephrostomy tube. Interval development of mild left hydronephrosis, moderate hydroureter, and large predominantly left sided intra-abdominal and retroperitoneal low-density fluid collection. Given patient's history, urine leak is of primary consideration. Bilateral nonobstructive nephrolithiasis. Left more than right lung base atelectasis. Interval development of mild pericardial thickening or effusion. Atherosclerotic disease of the aorta and  coronary arteries. These results will be called to the ordering clinician or representative by the Radiologist Assistant, and communication documented in the PACS or zVision Dashboard. Electronically Signed   By: Fidela Salisbury M.D.   On: 08/08/2015 11:59   Ct Image Guided Drainage By Percutaneous Catheter  08/09/2015  CLINICAL DATA:  Status post percutaneous nephrolithotomy procedure on 08/06/2015 for removal of left-sided renal calculi. CT performed for postoperative pain demonstrates large urinoma adjacent and inferior to the left kidney. Request has been made to drain the urinoma. EXAM: CT GUIDED DRAINAGE OF RETROPERITONEAL ABSCESS ANESTHESIA/SEDATION: 3.0 Mg IV Versed 75 mcg IV Fentanyl Total Moderate Sedation Time:  15 minutes. The patient's level of consciousness and an physiological status were continuously monitored during the procedure by nursing. PROCEDURE: The procedure, risks, benefits, and alternatives were explained to the patient. Questions regarding the procedure were encouraged and answered. The patient understands and consents to the procedure. A time-out was performed prior to the procedure. The left lateral  abdominal wall was prepped with Betadine in a sterile fashion, and a sterile drape was applied covering the operative field. A sterile gown and sterile gloves were used for the procedure. Local anesthesia was provided with 1% Lidocaine. The patient was placed in a supine position and the left side rolled up. Initial unenhanced CT was performed. After localization and prep, an 18 gauge trocar needle was advanced from a left lateral approach into the retroperitoneal space. Aspiration was performed through the needle. A guidewire was advanced. The tract was dilated. A 12 French percutaneous drainage catheter was then advanced over the guidewire. The catheter was flushed and connected to a gravity drainage bag. It was secured at the skin with a Prolene retention suture and StatLock device. COMPLICATIONS: None FINDINGS: A more focal portion of the urinoma inferior to the left kidney and anterior to the psoas muscle was targeted. Aspiration yielded bloody urine. IMPRESSION: CT-guided percutaneous drainage of left retroperitoneal urinoma with placement of 12 French drain. There was return of bloody urine. Electronically Signed   By: Aletta Edouard M.D.   On: 08/09/2015 10:29     Assessment and Plan: He is feeling much better post drainage but his WBC count is up and there was significant drainage yesterday.  I am going to monitor him at least one more day on antibiotics.         LOS: 1 day    Kerri-Anne Haeberle J 08/10/2015 [phone]

## 2015-08-10 NOTE — Progress Notes (Signed)
Referring Physician(s): Dr. Lemont Fillers  Chief Complaint: Left urinoma  Subjective: Pt feeling okay today S/p perc drain to LLQ urinoma on 1/21  Allergies: Crestor; Lipitor; Contrast media; Latex; Oxycodone; Adhesive; and Peanut-containing drug products  Medications:  Current facility-administered medications:  .  0.45 % sodium chloride infusion, , Intravenous, Continuous, Irine Seal, MD, Last Rate: 100 mL/hr at 08/10/15 0409 .  acetaminophen (TYLENOL) tablet 650 mg, 650 mg, Oral, Q4H PRN, Irine Seal, MD .  bisacodyl (DULCOLAX) suppository 10 mg, 10 mg, Rectal, Daily PRN, Irine Seal, MD, 10 mg at 08/10/15 1120 .  ceFAZolin (ANCEF) IVPB 1 g/50 mL premix, 1 g, Intravenous, 3 times per day, Irine Seal, MD, 1 g at 08/10/15 0540 .  clotrimazole (LOTRIMIN) 1 % cream, , Topical, BID PRN, Irine Seal, MD .  diphenhydrAMINE (BENADRYL) capsule 50 mg, 50 mg, Oral, QHS PRN, Irine Seal, MD .  ezetimibe (ZETIA) tablet 10 mg, 10 mg, Oral, Daily, Irine Seal, MD, 10 mg at 08/10/15 1046 .  fluticasone (FLONASE) 50 MCG/ACT nasal spray 2 spray, 2 spray, Each Nare, Daily, Irine Seal, MD, 2 spray at 08/07/15 [phone] .  HYDROcodone-acetaminophen (NORCO) 10-325 MG per tablet 1 tablet, 1 tablet, Oral, Q4H PRN, Irine Seal, MD .  HYDROmorphone (DILAUDID) injection 0.5-1 mg, 0.5-1 mg, Intravenous, Q2H PRN, Irine Seal, MD, 1 mg at 08/09/15 0917 .  hyoscyamine (LEVSIN SL) SL tablet 0.125 mg, 0.125 mg, Sublingual, Q4H PRN, Irine Seal, MD .  insulin pump 1 each, 1 each, Subcutaneous, Continuous, Irine Seal, MD, 1 each at 08/06/15 1754 .  insulin pump, , Subcutaneous, 6 times per day, Irine Seal, MD .  magnesium hydroxide (MILK OF MAGNESIA) suspension 30 mL, 30 mL, Oral, Daily PRN, Irine Seal, MD .  neomycin-polymyxin b-dexamethasone (MAXITROL) ophthalmic suspension 1 drop, 1 drop, Both Eyes, 4 times per day, Irine Seal, MD, 1 drop at 08/07/15 1708 .  ondansetron (ZOFRAN) injection 4 mg, 4 mg, Intravenous, Q4H PRN,  Irine Seal, MD, 4 mg at 08/09/15 0323 .  oxymetazoline (AFRIN) 0.05 % nasal spray 1 spray, 1 spray, Each Nare, BID PRN, Irine Seal, MD .  polyvinyl alcohol (LIQUIFILM TEARS) 1.4 % ophthalmic solution 1 drop, 1 drop, Both Eyes, PRN, Irine Seal, MD .  ramipril (ALTACE) capsule 10 mg, 10 mg, Oral, Daily, Irine Seal, MD, 10 mg at 08/10/15 1046 .  tamsulosin (FLOMAX) capsule 0.4 mg, 0.4 mg, Oral, Daily, Irine Seal, MD, 0.4 mg at 08/10/15 1046 .  zolpidem (AMBIEN) tablet 5 mg, 5 mg, Oral, QHS PRN,MR X 1, Irine Seal, MD    Vital Signs: BP 130/70 mmHg  Pulse 96  Temp(Src) 98.6 F (37 C) (Oral)  Resp 20  Ht 6\' 1"  (1.854 m)  Wt 234 lb (106.142 kg)  BMI 30.88 kg/m2  SpO2 95%  Physical Exam LLQ drain intact, site clean, NT Output clear urine, sl blood tinged   Imaging: Ct Abdomen Pelvis Wo Contrast  08/08/2015  CLINICAL DATA:  Lower left-sided abdominal pain and nausea. History of left nephrolithiasis, and recent removal of left nephrostomy tube. EXAM: CT ABDOMEN AND PELVIS WITHOUT CONTRAST TECHNIQUE: Multidetector CT imaging of the abdomen and pelvis was performed following the standard protocol without IV contrast. COMPARISON:  CT of the abdomen pelvis 08/07/2015 FINDINGS: Lower chest: There are hypoventilatory changes of bilateral lung bases, left more than right. There is mild pericardial thickening, maximum diameter of 6 mm. Calcific coronary artery disease is noted. Hepatobiliary: No mass visualized on this un-enhanced exam. Pancreas: No mass  or inflammatory process identified on this un-enhanced exam. Spleen: Within normal limits in size. Adrenals/Urinary Tract: There has been interval removal of left percutaneous nephrostomy tube. There is mild left hydronephrosis and hydroureter. There is irregular thickening of the proximal left ureter, the mid left ureter is not well visualized. There has been interval development of water density intra-abdominal fluid collection. Moderate amount of  retroperitoneal fluid is also seen along the left abdominal and pelvic wall, with significant collection anterior to the left psoas muscle. There are bilateral nonobstructing few mm renal calculi. Stomach/Bowel: No evidence of obstruction, inflammatory process, or abnormal fluid collections. Vascular/Lymphatic: No pathologically enlarged lymph nodes. No evidence of abdominal aortic aneurysm. Mild atherosclerotic disease of the aorta and its main branches. Reproductive: No mass or other significant abnormality. Musculoskeletal:  No suspicious bone lesions identified. IMPRESSION: Status post removal of left percutaneous nephrostomy tube. Interval development of mild left hydronephrosis, moderate hydroureter, and large predominantly left sided intra-abdominal and retroperitoneal low-density fluid collection. Given patient's history, urine leak is of primary consideration. Bilateral nonobstructive nephrolithiasis. Left more than right lung base atelectasis. Interval development of mild pericardial thickening or effusion. Atherosclerotic disease of the aorta and coronary arteries. These results will be called to the ordering clinician or representative by the Radiologist Assistant, and communication documented in the PACS or zVision Dashboard. Electronically Signed   By: Fidela Salisbury M.D.   On: 08/08/2015 11:59   Ct Abdomen Wo Contrast  08/07/2015  CLINICAL DATA:  Left flank pain right-sided nephrostomy, hematuria, postop day 1. Evaluate for residual stone. History of bladder cancer. EXAM: CT ABDOMEN WITHOUT CONTRAST TECHNIQUE: Multidetector CT imaging of the abdomen was performed following the standard protocol without IV contrast. COMPARISON:  CT abdomen pelvis dated 04/23/2015 FINDINGS: Lower chest: Mild patchy opacity in the posterior left lung base (series 4/image 13), likely rounded atelectasis. Additional mild dependent atelectasis at the right lung base. Hepatobiliary: Unenhanced liver is unremarkable.  Gallbladder is notable for a layering gallstones in the gallbladder fundus (series 2/ image 33). No associated inflammatory changes. Pancreas: Within normal limits. Spleen: Within normal limits. Adrenals/Urinary Tract: Adrenal glands are within normal limits. Status post left nephrostomy with nephroureteral stent. 5-6 nonobstructing left renal calculi, most of which measure 1-2 mm, with a 3 mm calculus in the posterior interpolar region (series 2/ image 30). No hydronephrosis. 12 mm left upper pole renal cyst (series 2/ image 24). Mild left perinephric stranding, likely postprocedural (series 2/image 44). Two nonobstructing right renal calculi measuring up to 5 mm in the right lower pole (series 2/ image 38). No hydronephrosis. Stomach/Bowel: Stomach is within normal limits. Visualized bowel is unremarkable. Vascular/Lymphatic: Atherosclerotic calcifications of the abdominal aorta and branch vessels. No suspicious abdominal lymphadenopathy. Other: No abdominal ascites. Musculoskeletal: No focal osseous lesions. IMPRESSION: Status post left nephrostomy with nephroureteral stent. 5-6 nonobstructing left renal calculi, measuring up to 3 mm in the interpolar region. Two nonobstructing right renal calculi measuring up to 5 mm in the right lower pole. No hydronephrosis. Cholelithiasis, without associated inflammatory changes. Electronically Signed   By: Julian Hy M.D.   On: 08/07/2015 08:25   Ct Image Guided Drainage By Percutaneous Catheter  08/09/2015  CLINICAL DATA:  Status post percutaneous nephrolithotomy procedure on 08/06/2015 for removal of left-sided renal calculi. CT performed for postoperative pain demonstrates large urinoma adjacent and inferior to the left kidney. Request has been made to drain the urinoma. EXAM: CT GUIDED DRAINAGE OF RETROPERITONEAL ABSCESS ANESTHESIA/SEDATION: 3.0 Mg IV Versed 75 mcg IV Fentanyl  Total Moderate Sedation Time:  15 minutes. The patient's level of consciousness and  an physiological status were continuously monitored during the procedure by nursing. PROCEDURE: The procedure, risks, benefits, and alternatives were explained to the patient. Questions regarding the procedure were encouraged and answered. The patient understands and consents to the procedure. A time-out was performed prior to the procedure. The left lateral abdominal wall was prepped with Betadine in a sterile fashion, and a sterile drape was applied covering the operative field. A sterile gown and sterile gloves were used for the procedure. Local anesthesia was provided with 1% Lidocaine. The patient was placed in a supine position and the left side rolled up. Initial unenhanced CT was performed. After localization and prep, an 18 gauge trocar needle was advanced from a left lateral approach into the retroperitoneal space. Aspiration was performed through the needle. A guidewire was advanced. The tract was dilated. A 12 French percutaneous drainage catheter was then advanced over the guidewire. The catheter was flushed and connected to a gravity drainage bag. It was secured at the skin with a Prolene retention suture and StatLock device. COMPLICATIONS: None FINDINGS: A more focal portion of the urinoma inferior to the left kidney and anterior to the psoas muscle was targeted. Aspiration yielded bloody urine. IMPRESSION: CT-guided percutaneous drainage of left retroperitoneal urinoma with placement of 12 French drain. There was return of bloody urine. Electronically Signed   By: Aletta Edouard M.D.   On: 08/09/2015 10:29    Labs:  CBC:  Recent Labs  06/23/15 1045 07/30/15 1120 08/06/15 0800 08/06/15 1502 08/07/15 0343 08/10/15 0440  WBC 10.8* 9.3 13.0*  --   --  21.2*  HGB 15.5 15.4 14.7 14.7 13.7 13.5  HCT 46.4 46.5 44.8 43.8 42.5 40.2  PLT 169 168 193  --   --  167    COAGS:  Recent Labs  08/06/15 0800  INR 1.02  APTT 31    BMP:  Recent Labs  08/06/15 0800 08/06/15 1440  08/07/15 0343 08/10/15 0440  NA 142 136 135 135  K 4.1 5.6* 4.5 3.9  CL 110 105 102 101  CO2 22 21* 25 24  GLUCOSE 179* 257* 211* 116*  BUN 17 15 14 12   CALCIUM 9.3 8.4* 8.6* 8.3*  CREATININE 0.90 0.88 0.84 0.82  GFRNONAA >60 >60 >60 >60  GFRAA >60 >60 >60 >60    LIVER FUNCTION TESTS:  Recent Labs  12/10/14 0958 04/23/15 0754  BILITOT 0.5 0.7  AST 17 23  ALT 20 22  ALKPHOS 89 90  PROT 7.0 7.1  ALBUMIN 4.1 4.0    Assessment and Plan: LLQ urinoma s/p perc drain 1/21 Follow output Probably repeat CT later this week, especially if output minimizes.  Electronically Signed: Ascencion Dike 08/10/2015, 2:04 PM   I spent a total of 15 Minutes at the the patient's bedside AND on the patient's hospital floor or unit, greater than 50% of which was counseling/coordinating care for LLQ urinoma drain

## 2015-08-11 ENCOUNTER — Encounter (HOSPITAL_COMMUNITY): Admission: RE | Payer: Self-pay | Source: Ambulatory Visit

## 2015-08-11 ENCOUNTER — Ambulatory Visit (HOSPITAL_COMMUNITY): Admission: RE | Admit: 2015-08-11 | Payer: BC Managed Care – PPO | Source: Ambulatory Visit | Admitting: Urology

## 2015-08-11 LAB — BASIC METABOLIC PANEL
Anion gap: 9 (ref 5–15)
BUN: 10 mg/dL (ref 6–20)
CO2: 26 mmol/L (ref 22–32)
Calcium: 8.3 mg/dL — ABNORMAL LOW (ref 8.9–10.3)
Chloride: 104 mmol/L (ref 101–111)
Creatinine, Ser: 0.78 mg/dL (ref 0.61–1.24)
GFR calc Af Amer: >60 mL/min (ref >60)
GFR calc non Af Amer: >60 mL/min (ref >60)
Glucose, Bld: 121 mg/dL — ABNORMAL HIGH (ref 65–99)
Potassium: 3.7 mmol/L (ref 3.5–5.1)
Sodium: 139 mmol/L (ref 135–145)

## 2015-08-11 LAB — GLUCOSE, CAPILLARY
Glucose-Capillary: 100 mg/dL — ABNORMAL HIGH (ref 65–99)
Glucose-Capillary: 141 mg/dL — ABNORMAL HIGH (ref 65–99)
Glucose-Capillary: 182 mg/dL — ABNORMAL HIGH (ref 65–99)
Glucose-Capillary: 97 mg/dL (ref 65–99)

## 2015-08-11 LAB — CBC
HCT: 37.4 % — ABNORMAL LOW (ref 39.0–52.0)
Hemoglobin: 12.4 g/dL — ABNORMAL LOW (ref 13.0–17.0)
MCH: 29.7 pg (ref 26.0–34.0)
MCHC: 33.2 g/dL (ref 30.0–36.0)
MCV: 89.7 fL (ref 78.0–100.0)
Platelets: 171 10*3/uL (ref 150–400)
RBC: 4.17 MIL/uL — ABNORMAL LOW (ref 4.22–5.81)
RDW: 15.1 % (ref 11.5–15.5)
WBC: 11.4 10*3/uL — ABNORMAL HIGH (ref 4.0–10.5)

## 2015-08-11 SURGERY — NEPHROLITHOTOMY PERCUTANEOUS SECOND LOOK
Anesthesia: General | Laterality: Left

## 2015-08-11 MED ORDER — HYDROCODONE-ACETAMINOPHEN 10-325 MG PO TABS: 1.0000 | ORAL_TABLET | ORAL | Status: DC | PRN

## 2015-08-11 MED ORDER — HYDROCODONE-ACETAMINOPHEN 10-325 MG PO TABS
1.0000 | ORAL_TABLET | ORAL | Status: DC | PRN
  Administered 2015-08-11: 1 via ORAL
  Filled 2015-08-11: qty 1

## 2015-08-11 MED ORDER — CEPHALEXIN 500 MG PO CAPS: 500.0000 mg | ORAL_CAPSULE | Freq: Three times a day (TID) | ORAL | Status: DC

## 2015-08-11 NOTE — Progress Notes (Signed)
[name]., male   DOB: 03-14-1951, 65 y.o.   MRN: [id number]    Referring Physician(s): Dr. Irine Seal   Chief Complaint: Urinoma  Subjective: Pt feeling well today. Output is decreasing.  Ready to go home  Allergies: Crestor; Lipitor; Contrast media; Latex; Oxycodone; Adhesive; and Peanut-containing drug products  Medications: Prior to Admission medications   Medication Sig Start Date End Date Taking? Authorizing Provider  aspirin EC 81 MG tablet Take 81 mg by mouth daily.   Yes Historical Provider, MD  Cholecalciferol (VITAMIN D) 2000 units tablet Take 2,000 Units by mouth daily.   Yes Historical Provider, MD  Ciclopirox 0.77 % gel Apply 1 Act topically 2 (two) times daily. Patient taking differently: Apply 1 Act topically 2 (two) times daily as needed (rash). Uses on ankle and feet 04/09/15  Yes Janith Lima, MD  Cyanocobalamin 3000 MCG CAPS Take 3,000 mcg by mouth daily.   Yes Historical Provider, MD  dapagliflozin propanediol (FARXIGA) 10 MG TABS tablet TAKE 1 TABLET BY MOUTH DAILY. Patient taking differently: Take 10 mg by mouth daily.  06/05/15  Yes Janith Lima, MD  diphenhydrAMINE (BENADRYL) 50 MG tablet Take 50 mg by mouth at bedtime as needed for itching.   Yes Historical Provider, MD  fluticasone (FLONASE) 50 MCG/ACT nasal spray Place 2 sprays into both nostrils daily. 07/17/15  Yes Renee A Kuneff, DO  Insulin Human (INSULIN PUMP) SOLN Inject 1 each into the skin continuous. Novolog insulin   Yes Historical Provider, MD  Meloxicam (VIVLODEX) 10 MG CAPS Take 1 capsule by mouth daily. Patient taking differently: Take 10 mg by mouth daily.  11/10/14  Yes Janith Lima, MD  neomycin-polymyxin-hydrocortisone (CORTISPORIN) 3.5-10000-1 ophthalmic suspension 4 (four) times daily.   Yes Historical Provider, MD  oxymetazoline (AFRIN) 0.05 % nasal spray Place 1 spray into both nostrils 2 (two) times daily as needed for congestion.   Yes Historical Provider, MD    Pitavastatin Calcium (LIVALO) 2 MG TABS Take 2 mg by mouth daily.    Yes Historical Provider, MD  Polyethyl Glycol-Propyl Glycol (SYSTANE OP) Place 1 drop into both eyes every 8 (eight) hours as needed (dry eyes).   Yes Historical Provider, MD  Potassium Citrate 15 MEQ (1620 MG) TBCR Take 1 tablet by mouth 2 (two) times daily.   Yes Historical Provider, MD  predniSONE (DELTASONE) 50 MG tablet Take 50 mg by mouth daily with breakfast.   Yes Historical Provider, MD  ramipril (ALTACE) 10 MG tablet Take 10 mg by mouth daily before breakfast.    Yes Historical Provider, MD  tadalafil (CIALIS) 20 MG tablet Take 20 mg by mouth daily as needed for erectile dysfunction.   Yes Historical Provider, MD  tamsulosin (FLOMAX) 0.4 MG CAPS capsule Take 1 capsule (0.4 mg total) by mouth daily. 04/22/15  Yes Carmin Muskrat, MD  ZETIA 10 MG tablet TAKE 1 TABLET (10 MG TOTAL) BY MOUTH DAILY. 09/07/14  Yes Janith Lima, MD  HYDROcodone-acetaminophen (NORCO) 5-325 MG tablet Take 1 tablet by mouth every 6 (six) hours as needed for moderate pain. 08/06/15   Irine Seal, MD  HYDROcodone-acetaminophen (NORCO/VICODIN) 5-325 MG tablet Take 1-2 tablets by mouth every 4 (four) hours as needed for moderate pain. 08/07/15   Franchot Gallo, MD  HYDROcodone-homatropine [facility]) 5-1.5 MG/5ML syrup Take 5 mLs by mouth every 8 (eight) hours as needed for cough. 07/17/15   [place], FNP  Insulin Glargine (LANTUS SOLOSTAR [place]) Inject 50 Units into  the skin 2 (two) times daily as needed. Only uses when pump goes out    Historical Provider, MD  ondansetron (ZOFRAN) 4 MG tablet Take 1 tablet (4 mg total) by mouth every 8 (eight) hours as needed for nausea or vomiting. 08/07/15   Franchot Gallo, MD  sildenafil (REVATIO) 20 MG tablet Take 20 mg by mouth daily as needed (erectile dysfunction).    Historical Provider, MD    Vital Signs: BP 140/75 mmHg  Pulse 79  Temp(Src) 98.4 F (36.9 C) (Oral)  Resp 20  Ht 6\' 1"  (1.854 m)   Wt 234 lb (106.142 kg)  BMI 30.88 kg/m2  SpO2 98%  Physical Exam: Abd: soft, minimally tender around drain, but appropriate, drain with essentially no output currently, drain site is c/d/i with no erythema or bleeding  Imaging: Ct Abdomen Pelvis Wo Contrast  08/08/2015  CLINICAL DATA:  Lower left-sided abdominal pain and nausea. History of left nephrolithiasis, and recent removal of left nephrostomy tube. EXAM: CT ABDOMEN AND PELVIS WITHOUT CONTRAST TECHNIQUE: Multidetector CT imaging of the abdomen and pelvis was performed following the standard protocol without IV contrast. COMPARISON:  CT of the abdomen pelvis 08/07/2015 FINDINGS: Lower chest: There are hypoventilatory changes of bilateral lung bases, left more than right. There is mild pericardial thickening, maximum diameter of 6 mm. Calcific coronary artery disease is noted. Hepatobiliary: No mass visualized on this un-enhanced exam. Pancreas: No mass or inflammatory process identified on this un-enhanced exam. Spleen: Within normal limits in size. Adrenals/Urinary Tract: There has been interval removal of left percutaneous nephrostomy tube. There is mild left hydronephrosis and hydroureter. There is irregular thickening of the proximal left ureter, the mid left ureter is not well visualized. There has been interval development of water density intra-abdominal fluid collection. Moderate amount of retroperitoneal fluid is also seen along the left abdominal and pelvic wall, with significant collection anterior to the left psoas muscle. There are bilateral nonobstructing few mm renal calculi. Stomach/Bowel: No evidence of obstruction, inflammatory process, or abnormal fluid collections. Vascular/Lymphatic: No pathologically enlarged lymph nodes. No evidence of abdominal aortic aneurysm. Mild atherosclerotic disease of the aorta and its main branches. Reproductive: No mass or other significant abnormality. Musculoskeletal:  No suspicious bone lesions  identified. IMPRESSION: Status post removal of left percutaneous nephrostomy tube. Interval development of mild left hydronephrosis, moderate hydroureter, and large predominantly left sided intra-abdominal and retroperitoneal low-density fluid collection. Given patient's history, urine leak is of primary consideration. Bilateral nonobstructive nephrolithiasis. Left more than right lung base atelectasis. Interval development of mild pericardial thickening or effusion. Atherosclerotic disease of the aorta and coronary arteries. These results will be called to the ordering clinician or representative by the Radiologist Assistant, and communication documented in the PACS or zVision Dashboard. Electronically Signed   By: Fidela Salisbury M.D.   On: 08/08/2015 11:59   Ct Image Guided Drainage By Percutaneous Catheter  08/09/2015  CLINICAL DATA:  Status post percutaneous nephrolithotomy procedure on 08/06/2015 for removal of left-sided renal calculi. CT performed for postoperative pain demonstrates large urinoma adjacent and inferior to the left kidney. Request has been made to drain the urinoma. EXAM: CT GUIDED DRAINAGE OF RETROPERITONEAL ABSCESS ANESTHESIA/SEDATION: 3.0 Mg IV Versed 75 mcg IV Fentanyl Total Moderate Sedation Time:  15 minutes. The patient's level of consciousness and an physiological status were continuously monitored during the procedure by nursing. PROCEDURE: The procedure, risks, benefits, and alternatives were explained to the patient. Questions regarding the procedure were encouraged and answered. The patient understands  and consents to the procedure. A time-out was performed prior to the procedure. The left lateral abdominal wall was prepped with Betadine in a sterile fashion, and a sterile drape was applied covering the operative field. A sterile gown and sterile gloves were used for the procedure. Local anesthesia was provided with 1% Lidocaine. The patient was placed in a supine position  and the left side rolled up. Initial unenhanced CT was performed. After localization and prep, an 18 gauge trocar needle was advanced from a left lateral approach into the retroperitoneal space. Aspiration was performed through the needle. A guidewire was advanced. The tract was dilated. A 12 French percutaneous drainage catheter was then advanced over the guidewire. The catheter was flushed and connected to a gravity drainage bag. It was secured at the skin with a Prolene retention suture and StatLock device. COMPLICATIONS: None FINDINGS: A more focal portion of the urinoma inferior to the left kidney and anterior to the psoas muscle was targeted. Aspiration yielded bloody urine. IMPRESSION: CT-guided percutaneous drainage of left retroperitoneal urinoma with placement of 12 French drain. There was return of bloody urine. Electronically Signed   By: Aletta Edouard M.D.   On: 08/09/2015 10:29    Labs:  CBC:  Recent Labs  07/30/15 1120 08/06/15 0800 08/06/15 1502 08/07/15 0343 08/10/15 0440 08/11/15 0437  WBC 9.3 13.0*  --   --  21.2* 11.4*  HGB 15.4 14.7 14.7 13.7 13.5 12.4*  HCT 46.5 44.8 43.8 42.5 40.2 37.4*  PLT 168 193  --   --  167 171    COAGS:  Recent Labs  08/06/15 0800  INR 1.02  APTT 31    BMP:  Recent Labs  08/06/15 1440 08/07/15 0343 08/10/15 0440 08/11/15 0437  NA 136 135 135 139  K 5.6* 4.5 3.9 3.7  CL 105 102 101 104  CO2 21* 25 24 26   GLUCOSE 257* 211* 116* 121*  BUN 15 14 12 10   CALCIUM 8.4* 8.6* 8.3* 8.3*  CREATININE 0.88 0.84 0.82 0.78  GFRNONAA >60 >60 >60 >60  GFRAA >60 >60 >60 >60    LIVER FUNCTION TESTS:  Recent Labs  12/10/14 0958 04/23/15 0754  BILITOT 0.5 0.7  AST 17 23  ALT 20 22  ALKPHOS 89 90  PROT 7.0 7.1  ALBUMIN 4.1 4.0    Assessment and Plan: 1. Urinoma, s/p drain placement Dr. Kathlene Cote 08/08/15  Cont drain for now.  Output seems to be decreasing.  600cc -->60cc (1/23)  Plan for repeat CT scan soon if output  continues to decrease  If patient wanted to go home with his drain, he could follow up for repeat imaging in our drain clinic, if urology was otherwise ok with him being discharged.  Electronically Signed: Henreitta Cea 08/11/2015, 9:40 AM   I spent a total of 15 Minutes at the the patient's bedside AND on the patient's hospital floor or unit, greater than 50% of which was counseling/coordinating care for urinoma, drain placement

## 2015-08-12 ENCOUNTER — Other Ambulatory Visit: Payer: Self-pay | Admitting: Physician Assistant

## 2015-08-12 DIAGNOSIS — IMO0002 Reserved for concepts with insufficient information to code with codable children: Secondary | ICD-10-CM | POA: Diagnosis not present

## 2015-08-12 NOTE — Discharge Summary (Signed)
Physician Discharge Summary  [name]. MRN: [id number] DOB/AGE: December 27, 1950 65 y.o.  Admit date: 08/06/2015 Discharge date: 08/12/2015  Admission Diagnoses:  Left nephrolithiasis  Discharge Diagnoses:  Principal Problem:   Left nephrolithiasis Active Problems:   Urinoma   Past Medical History  Diagnosis Date  . Hyperlipidemia   . Hypertension   . History of bladder cancer     1998--  TCC  . Nephrolithiasis     bilateral --  nonobstructive per CT  . History of kidney stones   . LADA (latent autoimmune diabetes in adults), managed as type 1 ([place])     Surgeries: Procedure(s): 1ST STAGE  LEFT PERCUTANEOUS NEPHROLITHOTOMY  on 08/06/2015   Consultants (if any):    Discharged Condition: Improved  Hospital Course: Anthony Daniel. is an 65 y.o. male who was admitted 08/06/2015 with a diagnosis of Left nephrolithiasis and went to the operating room on 08/06/2015 and underwent the above named procedures.  He had multiple stones with a burden of >2cm.   On the first postop day a CT showed only small residual stones and it was felt the tube could be removed.   Later that day he had increased pain and a CT showed a large left urinoma with mild hydro.   He had placement of a left perirenal drain with large initial output and a significant reduction in his pain.   The drainage had declined markedly by 2/24 and he was felt to be ready for discharge.  He was afebrile and was voiding with some hematuria.  His WBC count had nearly normalized.  He was maintained on ancef during the hospital stay.  He was discharged with the tube.    He was given perioperative antibiotics:      Anti-infectives    Start     Dose/Rate Route Frequency Ordered Stop   08/11/15 0000  cephALEXin (KEFLEX) 500 MG capsule     500 mg Oral 3 times daily 08/11/15 1029     08/06/15 1800  ceFAZolin (ANCEF) IVPB 1 g/50 mL premix  Status:  Discontinued     1 g 100 mL/hr over 30 Minutes Intravenous 3 times per  day 08/06/15 1612 08/11/15 1559   08/06/15 0745  ciprofloxacin (CIPRO) IVPB 400 mg  Status:  Discontinued     400 mg 200 mL/hr over 60 Minutes Intravenous To Radiology 08/06/15 0744 08/06/15 1603    .  He was given sequential compression devices for DVT prophylaxis.  He benefited maximally from the hospital stay but his stay was complicated by the post op urine leak and urinoma. .    Recent vital signs:  Filed Vitals:   08/11/15 0409 08/11/15 1028  BP: 140/75 144/67  Pulse: 79 75  Temp: 98.4 F (36.9 C)   Resp: 20 20    Recent laboratory studies:  Lab Results  Component Value Date   HGB 12.4* 08/11/2015   HGB 13.5 08/10/2015   HGB 13.7 08/07/2015   Lab Results  Component Value Date   WBC 11.4* 08/11/2015   PLT 171 08/11/2015   Lab Results  Component Value Date   INR 1.02 08/06/2015   Lab Results  Component Value Date   NA 139 08/11/2015   K 3.7 08/11/2015   CL 104 08/11/2015   CO2 26 08/11/2015   BUN 10 08/11/2015   CREATININE 0.78 08/11/2015   GLUCOSE 121* 08/11/2015    Discharge Medications:     Medication List    TAKE these  medications        aspirin EC 81 MG tablet  Take 81 mg by mouth daily.     cephALEXin 500 MG capsule  Commonly known as:  KEFLEX  Take 1 capsule (500 mg total) by mouth 3 (three) times daily.     Ciclopirox 0.77 % gel  Apply 1 Act topically 2 (two) times daily.     Cyanocobalamin 3000 MCG Caps  Take 3,000 mcg by mouth daily.     dapagliflozin propanediol 10 MG Tabs tablet  Commonly known as:  FARXIGA  TAKE 1 TABLET BY MOUTH DAILY.     diphenhydrAMINE 50 MG tablet  Commonly known as:  BENADRYL  Take 50 mg by mouth at bedtime as needed for itching.     fluticasone 50 MCG/ACT nasal spray  Commonly known as:  FLONASE  Place 2 sprays into both nostrils daily.     HYDROcodone-acetaminophen 5-325 MG tablet  Commonly known as:  NORCO  Take 1 tablet by mouth every 6 (six) hours as needed for moderate pain.      HYDROcodone-acetaminophen 5-325 MG tablet  Commonly known as:  NORCO/VICODIN  Take 1-2 tablets by mouth every 4 (four) hours as needed for moderate pain.     HYDROcodone-acetaminophen 10-325 MG tablet  Commonly known as:  NORCO  Take 1 tablet by mouth every 4 (four) hours as needed.     HYDROcodone-homatropine 5-1.5 MG/5ML syrup  Commonly known as:  HYCODAN  Take 5 mLs by mouth every 8 (eight) hours as needed for cough.     insulin pump Soln  Inject 1 each into the skin continuous. Novolog insulin     LANTUS SOLOSTAR [place]  Inject 50 Units into the skin 2 (two) times daily as needed. Only uses when pump goes out     LIVALO 2 MG Tabs  Generic drug:  Pitavastatin Calcium  Take 2 mg by mouth daily.     Meloxicam 10 MG Caps  Commonly known as:  VIVLODEX  Take 1 capsule by mouth daily.     neomycin-polymyxin-hydrocortisone 3.5-10000-1 ophthalmic suspension  Commonly known as:  CORTISPORIN  4 (four) times daily.     ondansetron 4 MG tablet  Commonly known as:  ZOFRAN  Take 1 tablet (4 mg total) by mouth every 8 (eight) hours as needed for nausea or vomiting.     oxymetazoline 0.05 % nasal spray  Commonly known as:  AFRIN  Place 1 spray into both nostrils 2 (two) times daily as needed for congestion.     Potassium Citrate 15 MEQ (1620 MG) Tbcr  Take 1 tablet by mouth 2 (two) times daily.     predniSONE 50 MG tablet  Commonly known as:  DELTASONE  Take 50 mg by mouth daily with breakfast.     ramipril 10 MG tablet  Commonly known as:  ALTACE  Take 10 mg by mouth daily before breakfast.     sildenafil 20 MG tablet  Commonly known as:  REVATIO  Take 20 mg by mouth daily as needed (erectile dysfunction).     SYSTANE OP  Place 1 drop into both eyes every 8 (eight) hours as needed (dry eyes).     tadalafil 20 MG tablet  Commonly known as:  CIALIS  Take 20 mg by mouth daily as needed for erectile dysfunction.     tamsulosin 0.4 MG Caps capsule  Commonly known as:  FLOMAX   Take 1 capsule (0.4 mg total) by mouth daily.  Vitamin D 2000 units tablet  Take 2,000 Units by mouth daily.     ZETIA 10 MG tablet  Generic drug:  ezetimibe  TAKE 1 TABLET (10 MG TOTAL) BY MOUTH DAILY.        Diagnostic Studies: Ct Abdomen Pelvis Wo Contrast  08/08/2015  CLINICAL DATA:  Lower left-sided abdominal pain and nausea. History of left nephrolithiasis, and recent removal of left nephrostomy tube. EXAM: CT ABDOMEN AND PELVIS WITHOUT CONTRAST TECHNIQUE: Multidetector CT imaging of the abdomen and pelvis was performed following the standard protocol without IV contrast. COMPARISON:  CT of the abdomen pelvis 08/07/2015 FINDINGS: Lower chest: There are hypoventilatory changes of bilateral lung bases, left more than right. There is mild pericardial thickening, maximum diameter of 6 mm. Calcific coronary artery disease is noted. Hepatobiliary: No mass visualized on this un-enhanced exam. Pancreas: No mass or inflammatory process identified on this un-enhanced exam. Spleen: Within normal limits in size. Adrenals/Urinary Tract: There has been interval removal of left percutaneous nephrostomy tube. There is mild left hydronephrosis and hydroureter. There is irregular thickening of the proximal left ureter, the mid left ureter is not well visualized. There has been interval development of water density intra-abdominal fluid collection. Moderate amount of retroperitoneal fluid is also seen along the left abdominal and pelvic wall, with significant collection anterior to the left psoas muscle. There are bilateral nonobstructing few mm renal calculi. Stomach/Bowel: No evidence of obstruction, inflammatory process, or abnormal fluid collections. Vascular/Lymphatic: No pathologically enlarged lymph nodes. No evidence of abdominal aortic aneurysm. Mild atherosclerotic disease of the aorta and its main branches. Reproductive: No mass or other significant abnormality. Musculoskeletal:  No suspicious bone  lesions identified. IMPRESSION: Status post removal of left percutaneous nephrostomy tube. Interval development of mild left hydronephrosis, moderate hydroureter, and large predominantly left sided intra-abdominal and retroperitoneal low-density fluid collection. Given patient's history, urine leak is of primary consideration. Bilateral nonobstructive nephrolithiasis. Left more than right lung base atelectasis. Interval development of mild pericardial thickening or effusion. Atherosclerotic disease of the aorta and coronary arteries. These results will be called to the ordering clinician or representative by the Radiologist Assistant, and communication documented in the PACS or zVision Dashboard. Electronically Signed   By: Fidela Salisbury M.D.   On: 08/08/2015 11:59   Ct Abdomen Wo Contrast  08/07/2015  CLINICAL DATA:  Left flank pain right-sided nephrostomy, hematuria, postop day 1. Evaluate for residual stone. History of bladder cancer. EXAM: CT ABDOMEN WITHOUT CONTRAST TECHNIQUE: Multidetector CT imaging of the abdomen was performed following the standard protocol without IV contrast. COMPARISON:  CT abdomen pelvis dated 04/23/2015 FINDINGS: Lower chest: Mild patchy opacity in the posterior left lung base (series 4/image 13), likely rounded atelectasis. Additional mild dependent atelectasis at the right lung base. Hepatobiliary: Unenhanced liver is unremarkable. Gallbladder is notable for a layering gallstones in the gallbladder fundus (series 2/ image 33). No associated inflammatory changes. Pancreas: Within normal limits. Spleen: Within normal limits. Adrenals/Urinary Tract: Adrenal glands are within normal limits. Status post left nephrostomy with nephroureteral stent. 5-6 nonobstructing left renal calculi, most of which measure 1-2 mm, with a 3 mm calculus in the posterior interpolar region (series 2/ image 30). No hydronephrosis. 12 mm left upper pole renal cyst (series 2/ image 24). Mild left  perinephric stranding, likely postprocedural (series 2/image 44). Two nonobstructing right renal calculi measuring up to 5 mm in the right lower pole (series 2/ image 38). No hydronephrosis. Stomach/Bowel: Stomach is within normal limits. Visualized bowel is unremarkable. Vascular/Lymphatic:  Atherosclerotic calcifications of the abdominal aorta and branch vessels. No suspicious abdominal lymphadenopathy. Other: No abdominal ascites. Musculoskeletal: No focal osseous lesions. IMPRESSION: Status post left nephrostomy with nephroureteral stent. 5-6 nonobstructing left renal calculi, measuring up to 3 mm in the interpolar region. Two nonobstructing right renal calculi measuring up to 5 mm in the right lower pole. No hydronephrosis. Cholelithiasis, without associated inflammatory changes. Electronically Signed   By: Julian Hy M.D.   On: 08/07/2015 08:25   Dg C-arm 1-60 Min-no Report  08/06/2015  CLINICAL DATA: surgery C-ARM 1-60 MINUTES Fluoroscopy was utilized by the requesting physician.  No radiographic interpretation.   Ir Ureteral Stent Left New Access W/o Sep Nephrostomy Cath  08/06/2015  CLINICAL DATA:  Renal stones, access for left percutaneous nephrolithotomy. EXAM: IR URETERAL STENT LEFT NEW ACCESS W/O SEP NEPHROSTOMY CATH Date: 08/06/2015 PROCEDURE: 1. Percutaneous puncture of the renal collecting system under fluoroscopic guidance 2. Placement of a percutaneous nephrostomy tube Operating Physician:  Criselda Peaches, MD COMPARISON:  CT of the abdomen and pelvis - 04/23/2015 ANESTHESIA/SEDATION: Moderate (conscious) sedation was used. 4 mg Versed, 175 mcg Fentanyl were administered intravenously. The patient's vital signs were monitored continuously by radiology nursing throughout the procedure. 400 mg Cipro was administered IV at an appropriate time prior to skin puncture. Sedation Time: 31 minutes FLUOROSCOPY TIME:  10 minutes 6 seconds for a total of 471 mGy CONTRAST:  20 mL instilled into  the collecting system. TECHNIQUE: Informed written consent was obtained from the patient after a discussion of the risks, benefits, and alternatives to treatment. The left flank region was prepped with Betadine in a sterile fashion, and a sterile drape was applied covering the operative field. A sterile gown and sterile gloves were used for the procedure. A timeout was performed prior to the initiation of the procedure. A pre procedural spot fluoroscopic image was obtained of the upper abdomen. Ultrasound scanning performed of the kidney was negative for significant hydronephrosis. As such, the stone within an inter to lower pole posterior calyx was targeted fluoroscopically with a [place] needle. Access to the collecting system was confirmed with aspiration of clear urine followed by injection of contrast material. A Nitrex wire was then advanced into the collecting system. The needle was exchanged for the Accustick set and additional contrast injection confirmed access. A small amount of air was injected into the collecting system to demonstrate access in the posterior calyx. Using a glidewire an angled 4 French catheter, the wire was successfully navigated down the ureter and into the bladder. The 4 French catheter and Accustick sheath were removed. A 5 [place] Kum[place] the catheter was advanced over the wire. The Kumpe catheter was advanced down the ureter and into the urinary bladder. Postprocedural spot radiographs were obtained in various obliquities and the catheter was sutured to the skin. The catheter was capped and a dressing was placed. The patient tolerated the procedure well without immediate postprocedural complication. COMPLICATIONS: None immediate IMPRESSION: Successful fluoroscopic guided left percutaneous nephrostomy with placement of a 5 French Kumpe catheter to the level of the urinary bladder to be utilized during impending nephrolithotomy procedure. Signed, Criselda Peaches, MD [facility] Radiology Electronically Signed   By: Jacqulynn Cadet M.D.   On: 08/06/2015 11:14   Ct Image Guided Drainage By Percutaneous Catheter  08/09/2015  CLINICAL DATA:  Status post percutaneous nephrolithotomy procedure on 08/06/2015 for removal of left-sided renal calculi. CT performed for postoperative pain demonstrates large  urinoma adjacent and inferior to the left kidney. Request has been made to drain the urinoma. EXAM: CT GUIDED DRAINAGE OF RETROPERITONEAL ABSCESS ANESTHESIA/SEDATION: 3.0 Mg IV Versed 75 mcg IV Fentanyl Total Moderate Sedation Time:  15 minutes. The patient's level of consciousness and an physiological status were continuously monitored during the procedure by nursing. PROCEDURE: The procedure, risks, benefits, and alternatives were explained to the patient. Questions regarding the procedure were encouraged and answered. The patient understands and consents to the procedure. A time-out was performed prior to the procedure. The left lateral abdominal wall was prepped with Betadine in a sterile fashion, and a sterile drape was applied covering the operative field. A sterile gown and sterile gloves were used for the procedure. Local anesthesia was provided with 1% Lidocaine. The patient was placed in a supine position and the left side rolled up. Initial unenhanced CT was performed. After localization and prep, an 18 gauge trocar needle was advanced from a left lateral approach into the retroperitoneal space. Aspiration was performed through the needle. A guidewire was advanced. The tract was dilated. A 12 French percutaneous drainage catheter was then advanced over the guidewire. The catheter was flushed and connected to a gravity drainage bag. It was secured at the skin with a Prolene retention suture and StatLock device. COMPLICATIONS: None FINDINGS: A more focal portion of the urinoma inferior to the left kidney and anterior to the psoas  muscle was targeted. Aspiration yielded bloody urine. IMPRESSION: CT-guided percutaneous drainage of left retroperitoneal urinoma with placement of 12 French drain. There was return of bloody urine. Electronically Signed   By: Aletta Edouard M.D.   On: 08/09/2015 10:29    Disposition: 01-Home or Self Care  Discharge Instructions    Discontinue IV    Complete by:  As directed            Follow-up Information    Follow up with Malka So, MD On 08/13/2015.   Specialty:  Urology   Why:  1pm   Contact information:   [place] [address] [phone]        Signed: Malka So 08/12/2015, 4:52 PM

## 2015-08-13 NOTE — Telephone Encounter (Signed)
Unable to submit PA states patient inactive, although it has bene e-verified through system. Will check with pt on insurance

## 2015-08-31 ENCOUNTER — Telehealth: Payer: Self-pay

## 2015-08-31 NOTE — Telephone Encounter (Signed)
PA initiated via CoverMyMeds [id number]

## 2015-09-11 ENCOUNTER — Other Ambulatory Visit: Payer: Self-pay | Admitting: Internal Medicine

## 2015-09-14 NOTE — Telephone Encounter (Signed)
PA denied, pt advised via home VM to schedule ROV to follow up with PCP

## 2015-10-01 ENCOUNTER — Other Ambulatory Visit: Payer: Self-pay | Admitting: Internal Medicine

## 2015-10-03 ENCOUNTER — Other Ambulatory Visit: Payer: Self-pay | Admitting: Internal Medicine

## 2015-10-14 LAB — HM DIABETES EYE EXAM

## 2015-11-05 ENCOUNTER — Other Ambulatory Visit: Payer: Self-pay | Admitting: Internal Medicine

## 2015-11-16 ENCOUNTER — Ambulatory Visit (INDEPENDENT_AMBULATORY_CARE_PROVIDER_SITE_OTHER): Payer: BC Managed Care – PPO | Admitting: Family

## 2015-11-16 ENCOUNTER — Encounter: Payer: Self-pay | Admitting: Family

## 2015-11-16 ENCOUNTER — Telehealth: Payer: Self-pay | Admitting: Internal Medicine

## 2015-11-16 VITALS — BP 144/80 | HR 80 | Temp 98.2°F | Ht 73.0 in | Wt 236.0 lb

## 2015-11-16 DIAGNOSIS — M62838 Other muscle spasm: Secondary | ICD-10-CM

## 2015-11-16 DIAGNOSIS — M6248 Contracture of muscle, other site: Secondary | ICD-10-CM | POA: Diagnosis not present

## 2015-11-16 DIAGNOSIS — M5030 Other cervical disc degeneration, unspecified cervical region: Secondary | ICD-10-CM

## 2015-11-16 MED ORDER — DICLOFENAC SODIUM 1 % TD GEL: 4.0000 g | Freq: Four times a day (QID) | TRANSDERMAL | Status: DC

## 2015-11-16 MED ORDER — CYCLOBENZAPRINE HCL 10 MG PO TABS: 10.0000 mg | ORAL_TABLET | Freq: Every evening | ORAL | Status: DC | PRN

## 2015-11-16 NOTE — Telephone Encounter (Signed)
Pt came into the office today to see [name], and mentioned that the meloxicam capsules are very pricey with his insurance. Can it be changed to something different?

## 2015-11-16 NOTE — Progress Notes (Signed)
Pre visit review using our clinic review tool, if applicable. No additional management support is needed unless otherwise documented below in the visit note. 

## 2015-11-16 NOTE — Progress Notes (Signed)
Subjective:    Patient ID: Anthony Seashore., male    DOB: 1951-03-18, 65 y.o.   MRN: [id number]   Anthony Cavitt. is a 65 y.o. male who presents today for an acute visit.    HPI Comments: Patient here for evaluation of chronic, acute left sided posterior neck pain. Describes as stiff neck. Notes had oral surgery  which included bone graphs and 'sinus lift' one week ago.  Believes some yard work and sitting in dentist chair for couple of hours may have contributed to pain.Used icey hot and it gave some relief. Endorses chills. Rates neck pain as moderate. Mild bilateral arm numbness last night, resolved. Denies UE weakness.  Denies exertional chest pain or pressure, numbness or tingling radiating to left arm or jaw, palpitations, dizziness, frequent headaches, changes in vision, or shortness of breath.   In the past has used meloxicam for neck OA however notes too expensive now. No h/o GIB.  Neck Pain  Pertinent negatives include no chest pain, fever or headaches.   Past Medical History  Diagnosis Date  . Hyperlipidemia   . Hypertension   . History of bladder cancer     1998--  TCC  . Nephrolithiasis     bilateral --  nonobstructive per CT  . History of kidney stones   . LADA (latent autoimmune diabetes in adults), managed as type 1 ([place])    Crestor; Lipitor; Contrast media; Latex; Oxycodone; Adhesive; and Peanut-containing drug products Current Outpatient Prescriptions on File Prior to Visit  Medication Sig Dispense Refill  . aspirin EC 81 MG tablet Take 81 mg by mouth daily.    . cephALEXin (KEFLEX) 500 MG capsule Take 1 capsule (500 mg total) by mouth 3 (three) times daily. 12 capsule 0  . Cholecalciferol (VITAMIN D) 2000 units tablet Take 2,000 Units by mouth daily.    . Ciclopirox 0.77 % gel APPLY TO AFFECTED AREA(S) TWICE DAILY 30 g 3  . Cyanocobalamin 3000 MCG CAPS Take 3,000 mcg by mouth daily.    . dapagliflozin propanediol (FARXIGA) 10 MG TABS tablet Take 10 mg by mouth  daily. Yearly physical w/labs due in May must see md for refills 90 tablet 0  . diphenhydrAMINE (BENADRYL) 50 MG tablet Take 50 mg by mouth at bedtime as needed for itching.    . ezetimibe (ZETIA) 10 MG tablet TAKE 1 TABLET (10 MG TOTAL) BY MOUTH DAILY. 30 tablet 0  . fluticasone (FLONASE) 50 MCG/ACT nasal spray Place 2 sprays into both nostrils daily. 16 g 2  . HYDROcodone-acetaminophen (NORCO) 10-325 MG tablet Take 1 tablet by mouth every 4 (four) hours as needed. 25 tablet 0  . HYDROcodone-acetaminophen (NORCO) 5-325 MG tablet Take 1 tablet by mouth every 6 (six) hours as needed for moderate pain. 20 tablet 0  . HYDROcodone-acetaminophen (NORCO/VICODIN) 5-325 MG tablet Take 1-2 tablets by mouth every 4 (four) hours as needed for moderate pain. 30 tablet 0  . HYDROcodone-homatropine (HYCODAN) 5-1.5 MG/5ML syrup Take 5 mLs by mouth every 8 (eight) hours as needed for cough. 180 mL 0  . Insulin Glargine (LANTUS SOLOSTAR [place]) Inject 50 Units into the skin 2 (two) times daily as needed. Only uses when pump goes out    . Insulin Human (INSULIN PUMP) SOLN Inject 1 each into the skin continuous. Novolog insulin    . Meloxicam (VIVLODEX) 10 MG CAPS Take 1 capsule by mouth daily. (Patient taking differently: Take 10 mg by mouth daily. ) 30 capsule 11  .  neomycin-polymyxin-hydrocortisone (CORTISPORIN) 3.5-10000-1 ophthalmic suspension 4 (four) times daily.    . ondansetron (ZOFRAN) 4 MG tablet Take 1 tablet (4 mg total) by mouth every 8 (eight) hours as needed for nausea or vomiting. 20 tablet 0  . oxymetazoline (AFRIN) 0.05 % nasal spray Place 1 spray into both nostrils 2 (two) times daily as needed for congestion.    . Pitavastatin Calcium (LIVALO) 2 MG TABS Take 2 mg by mouth daily.     Anthony Daniel Glycol-Propyl Glycol (SYSTANE OP) Place 1 drop into both eyes every 8 (eight) hours as needed (dry eyes).    . Potassium Citrate 15 MEQ (1620 MG) TBCR Take 1 tablet by mouth 2 (two) times daily.    . predniSONE  (DELTASONE) 50 MG tablet Take 50 mg by mouth daily with breakfast.    . ramipril (ALTACE) 10 MG tablet Take 10 mg by mouth daily before breakfast.     . sildenafil (REVATIO) 20 MG tablet Take 20 mg by mouth daily as needed (erectile dysfunction).    . tadalafil (CIALIS) 20 MG tablet Take 20 mg by mouth daily as needed for erectile dysfunction.    . tamsulosin (FLOMAX) 0.4 MG CAPS capsule Take 1 capsule (0.4 mg total) by mouth daily. 30 capsule 0   No current facility-administered medications on file prior to visit.    Social History  Substance Use Topics  . Smoking status: Current Every Day Smoker -- 1.00 packs/day for 30 years    Types: Cigarettes  . Smokeless tobacco: Never Used  . Alcohol Use: 3.0 oz/week    5 Shots of liquor per week     Comment: occasional    Review of Systems  Constitutional: Negative for fever and chills.  HENT: Negative for congestion, ear pain, rhinorrhea, sinus pressure and sore throat.   Respiratory: Negative for cough, shortness of breath and wheezing.   Cardiovascular: Negative for chest pain and palpitations.  Gastrointestinal: Negative for nausea, vomiting and diarrhea.  Musculoskeletal: Positive for neck pain and neck stiffness. Negative for myalgias.  Neurological: Negative for headaches.      Objective:    BP 144/80 mmHg  Pulse 80  Temp(Src) 98.2 F (36.8 C) (Oral)  Ht 6\' 1"  (1.854 m)  Wt 236 lb (107.049 kg)  BMI 31.14 kg/m2  SpO2 96%   Physical Exam  Constitutional: He appears well-developed and well-nourished.  Neck: Normal range of motion and full passive range of motion without pain. Neck supple. Spinous process tenderness and muscular tenderness present. No rigidity. No edema, no erythema and normal range of motion present.    Left-sided tenderness, spasm as noted on diagram. Negative Spurling's test.  Cardiovascular: Regular rhythm and normal heart sounds.   Pulmonary/Chest: Effort normal and breath sounds normal. No respiratory  distress. He has no wheezes. He has no rhonchi. He has no rales.  Lymphadenopathy:       Head (left side): No submandibular and no preauricular adenopathy present.  Neurological: He is alert.  Strength and sensation of UE intact, symmetrical.   Skin: Skin is warm and dry.  Psychiatric: He has a normal mood and affect. His speech is normal and behavior is normal.  Vitals reviewed.      Assessment & Plan:   1. Neck muscle spasm  - cyclobenzaprine (FLEXERIL) 10 MG tablet; Take 1 tablet (10 mg total) by mouth at bedtime as needed for muscle spasms.  Dispense: 14 tablet; Refill: 0 - diclofenac sodium (VOLTAREN) 1 % GEL; Apply 4 g topically  4 (four) times daily.  Dispense: 1 Tube; Refill: 3   I am having Anthony Daniel maintain his ramipril, Insulin Glargine (LANTUS SOLOSTAR [place]), Potassium Citrate, tadalafil, aspirin EC, Pitavastatin Calcium, Meloxicam, oxymetazoline, tamsulosin, Cyanocobalamin, Polyethyl Glycol-Propyl Glycol (SYSTANE OP), insulin pump, sildenafil, Vitamin D, HYDROcodone-homatropine, fluticasone, predniSONE, diphenhydrAMINE, neomycin-polymyxin-hydrocortisone, HYDROcodone-acetaminophen, HYDROcodone-acetaminophen, ondansetron, HYDROcodone-acetaminophen, cephALEXin, Ciclopirox, dapagliflozin propanediol, and ezetimibe.   No orders of the defined types were placed in this encounter.     Start medications as prescribed and explained to patient on After Visit Summary ( AVS). Risks, benefits, and alternatives of the medications and treatment plan prescribed today were discussed, and patient expressed understanding.   Education regarding symptom management and diagnosis given to patient.   Follow-up:Plan follow-up as discussed or as needed if any worsening symptoms or change in condition. No Follow-up on file.   Continue to follow with Scarlette Calico, MD for routine health maintenance.   Anthony Seashore. and I agreed with plan.   [name], FNP

## 2015-11-16 NOTE — Patient Instructions (Signed)
If there is no improvement in your symptoms, or if there is any worsening of symptoms, or if you have any additional concerns, please return for re-evaluation; or, if we are closed, consider going to the Emergency Room for evaluation if symptoms urgent.  Muscle Cramps and Spasms Muscle cramps and spasms occur when a muscle or muscles tighten and you have no control over this tightening (involuntary muscle contraction). They are a common problem and can develop in any muscle. The most common place is in the calf muscles of the leg. Both muscle cramps and muscle spasms are involuntary muscle contractions, but they also have differences:   Muscle cramps are sporadic and painful. They may last a few seconds to a quarter of an hour. Muscle cramps are often more forceful and last longer than muscle spasms.  Muscle spasms may or may not be painful. They may also last just a few seconds or much longer. CAUSES  It is uncommon for cramps or spasms to be due to a serious underlying problem. In many cases, the cause of cramps or spasms is unknown. Some common causes are:   Overexertion.   Overuse from repetitive motions (doing the same thing over and over).   Remaining in a certain position for a long period of time.   Improper preparation, form, or technique while performing a sport or activity.   Dehydration.   Injury.   Side effects of some medicines.   Abnormally low levels of the salts and ions in your blood (electrolytes), especially potassium and calcium. This could happen if you are taking water pills (diuretics) or you are pregnant.  Some underlying medical problems can make it more likely to develop cramps or spasms. These include, but are not limited to:   Diabetes.   Parkinson disease.   Hormone disorders, such as thyroid problems.   Alcohol abuse.   Diseases specific to muscles, joints, and bones.   Blood vessel disease where not enough blood is getting to the  muscles.  HOME CARE INSTRUCTIONS   Stay well hydrated. Drink enough water and fluids to keep your urine clear or pale yellow.  It may be helpful to massage, stretch, and relax the affected muscle.  For tight or tense muscles, use a warm towel, heating pad, or hot shower water directed to the affected area.  If you are sore or have pain after a cramp or spasm, applying ice to the affected area may relieve discomfort.  Put ice in a plastic bag.  Place a towel between your skin and the bag.  Leave the ice on for 15-20 minutes, 03-04 times a day.  Medicines used to treat a known cause of cramps or spasms may help reduce their frequency or severity. Only take over-the-counter or prescription medicines as directed by your caregiver. SEEK MEDICAL CARE IF:  Your cramps or spasms get more severe, more frequent, or do not improve over time.  MAKE SURE YOU:   Understand these instructions.  Will watch your condition.  Will get help right away if you are not doing well or get worse.   This information is not intended to replace advice given to you by your health care provider. Make sure you discuss any questions you have with your health care provider.   Document Released: 12/24/2001 Document Revised: 10/29/2012 Document Reviewed: 06/20/2012 Elsevier Interactive Patient Education [employer].

## 2015-11-17 MED ORDER — MELOXICAM 15 MG PO TABS: 15.0000 mg | ORAL_TABLET | Freq: Every day | ORAL | Status: DC

## 2015-11-17 NOTE — Telephone Encounter (Signed)
Changed to a generic 

## 2015-11-18 NOTE — Telephone Encounter (Signed)
Called pt and left a message & let him know the pharmacy should have a script for him.

## 2015-11-30 ENCOUNTER — Telehealth: Payer: Self-pay | Admitting: Internal Medicine

## 2015-11-30 NOTE — Telephone Encounter (Signed)
[facility] - Client [place] [facility]     Patient Name: Anthony Daniel Initial Comment Caller states, stiff neck for 2 weeks, and Lt shoulder has gone numb now. It feels weird like it has been numbed with a local anesthetic  DOB: 10/08/1950      Nurse Assessment  Nurse: Luther Parody, RN, Malachy Mood Date/Time (Eastern Time): 11/30/2015 9:23:21 AM  Confirm and document reason for call. If symptomatic, describe symptoms. You must click the next button to save text entered. ---Caller states that he has had a stiff neck for the last 2 wks and he has been having numbness on the top of his left shoulder for the last 3-4 days. States that the s/s began after he had a dental procedure done on the left side of his mouth 2 wks ago.  Has the patient traveled out of the country within the last 30 days? ---Not Applicable  Does the patient have any new or worsening symptoms? ---Yes  Will a triage be completed? ---Yes  Related visit to physician within the last 2 weeks? ---No  Does the PT have any chronic conditions? (i.e. diabetes, asthma, etc.) ---Yes  List chronic conditions. ---type I diabetic  Is this a behavioral health or substance abuse call? ---No    Guidelines     Guideline Title Affirmed Question Affirmed Notes   Shoulder Pain Pain is worsened or caused by bending the neck    Final Disposition User   See PCP When Office is Open (within 3 days) Luther Parody, RN, [employer]     Referrals   REFERRED TO PCP OFFICE   Disagree/Comply: Leta Baptist

## 2015-11-30 NOTE — Telephone Encounter (Signed)
appt has been made for 12/01/15 @ 3:00 w/dr. Jenny Reichmann...Anthony Daniel

## 2015-12-01 ENCOUNTER — Encounter: Payer: Self-pay | Admitting: Internal Medicine

## 2015-12-01 ENCOUNTER — Ambulatory Visit (INDEPENDENT_AMBULATORY_CARE_PROVIDER_SITE_OTHER): Payer: BC Managed Care – PPO | Admitting: Internal Medicine

## 2015-12-01 VITALS — BP 150/88 | HR 91 | Temp 98.4°F | Resp 20 | Wt 231.0 lb

## 2015-12-01 DIAGNOSIS — M5412 Radiculopathy, cervical region: Secondary | ICD-10-CM | POA: Diagnosis not present

## 2015-12-01 DIAGNOSIS — M4802 Spinal stenosis, cervical region: Secondary | ICD-10-CM | POA: Insufficient documentation

## 2015-12-01 DIAGNOSIS — E139 Other specified diabetes mellitus without complications: Secondary | ICD-10-CM | POA: Diagnosis not present

## 2015-12-01 MED ORDER — PREDNISONE 20 MG PO TABS: 20.0000 mg | ORAL_TABLET | Freq: Every day | ORAL | Status: DC

## 2015-12-01 NOTE — Progress Notes (Signed)
Subjective:    [name]., male    DOB: 02-25-1951, 65 y.o.   MRN: [id number]  HPI  Here with acute onset 3 wks left neck pain, mod to severe with radiation to left shoudler and occas arm, assoc with numbness to left upper back and to the shoulder, but cant say noticed any numbness or weakness to the LUE.  No fever, trauma. No falls.  Pt without worsening LE pain/numbness/weakness, gait change or falls. Pt denies chest pain, increased sob or doe, wheezing, orthopnea, PND, increased LE swelling, palpitations, dizziness or syncope.  Pt denies new neurological symptoms such as new headache, or facial or extremity weakness or numbness other than the above.   Pt denies polydipsia, polyuria,   Pt states overall good compliance with meds, trying to follow lower cholesterol, diabetic diet, wearing insulin pump and cbg monitor Past Medical History  Diagnosis Date  . Hyperlipidemia   . Hypertension   . History of bladder cancer     1998--  TCC  . Nephrolithiasis     bilateral --  nonobstructive per CT  . History of kidney stones   . LADA (latent autoimmune diabetes in adults), managed as type 1 [facility])    Past Surgical History  Procedure Laterality Date  . Tonsillectomy  1975  . Laparoscopic appendectomy N/A 11/07/2012    Procedure: APPENDECTOMY LAPAROSCOPIC;  Surgeon: Harl Bowie, MD;  Location: [place];  Service: General;  Laterality: N/A;  . Cystoscopy with ureteroscopy and stent placement Right 04/23/2015    Procedure: CYSTOSCOPY WITH URETEROSCOPY RETROGRADE PYELOGRAM AND STENT PLACEMENT;  Surgeon: Cleon Gustin, MD;  Location: [facility];  Service: Urology;  Laterality: Right;  . Transurethral resection of bladder tumor  1998  . Extracorporeal shock wave lithotripsy  x2 in  2006//   x2  in 2012  . Cystoscopy/retrograde/ureteroscopy/stone extraction with basket Right 05/12/2015    Procedure: CYSTOSCOPY/RETROGRADE/URETEROSCOPY/STONE EXTRACTION WITH BASKET;  Surgeon: Cleon Gustin, MD;  Location: [facility];  Service: Urology;  Laterality: Right;  . Cystoscopy w/ ureteral stent placement Right 05/12/2015    Procedure: CYSTOSCOPY WITH STENT REPLACEMENT;  Surgeon: Cleon Gustin, MD;  Location: [facility];  Service: Urology;  Laterality: Right;  . Holmium laser application Right [id number]    Procedure: HOLMIUM LASER APPLICATION;  Surgeon: Cleon Gustin, MD;  Location: [facility];  Service: Urology;  Laterality: Right;  . Nephrolithotomy Left 08/06/2015    Procedure: 1ST STAGE  LEFT PERCUTANEOUS NEPHROLITHOTOMY ;  Surgeon: Irine Seal, MD;  Location: [facility];  Service: Urology;  Laterality: Left;    reports that he has been smoking Cigarettes.  He has a 30 pack-year smoking history. He has never used smokeless tobacco. He reports that he drinks about 3.0 oz of alcohol per week. He reports that he does not use illicit drugs. family history includes Cancer in his other; Diabetes in his other; Hypertension in his other. There is no history of Alcohol abuse, Drug abuse, Early death, Heart disease, Hyperlipidemia, Kidney disease, or Stroke. Allergies  Allergen Reactions  . Crestor [Rosuvastatin] Other (See Comments)    Muscle aches, cramps  . Lipitor [Atorvastatin] Other (See Comments)    Muscle aches, cramps  . Contrast Media [Iodinated Diagnostic Agents] Itching and Nausea And Vomiting    PT TRIED THIS TWICE.EVEN WITH PRE-MEDS AND STILL HAD NAUSEA AND ITCHING--WAS TOLD HE CAN NOT TAKE iv ANYMORE  . Latex Other (See Comments)  Irritation  . Oxycodone Nausea Only    Pt can tolerate hydrocodone   . Adhesive [Tape] Rash  . Peanut-Containing Drug Products Anxiety and Other (See Comments)    Jittery   Current Outpatient Prescriptions on File Prior to Visit  Medication Sig Dispense Refill  . aspirin EC 81 MG tablet Take 81 mg by mouth daily.    . cephALEXin (KEFLEX) 500 MG capsule Take 1 capsule (500 mg total)  by mouth 3 (three) times daily. 12 capsule 0  . Cholecalciferol (VITAMIN D) 2000 units tablet Take 2,000 Units by mouth daily.    . Ciclopirox 0.77 % gel APPLY TO AFFECTED AREA(S) TWICE DAILY 30 g 3  . Cyanocobalamin 3000 MCG CAPS Take 3,000 mcg by mouth daily.    . cyclobenzaprine (FLEXERIL) 10 MG tablet Take 1 tablet (10 mg total) by mouth at bedtime as needed for muscle spasms. 14 tablet 0  . dapagliflozin propanediol (FARXIGA) 10 MG TABS tablet Take 10 mg by mouth daily. Yearly physical w/labs due in May must see md for refills 90 tablet 0  . diclofenac sodium (VOLTAREN) 1 % GEL Apply 4 g topically 4 (four) times daily. 1 Tube 3  . diphenhydrAMINE (BENADRYL) 50 MG tablet Take 50 mg by mouth at bedtime as needed for itching.    . ezetimibe (ZETIA) 10 MG tablet TAKE 1 TABLET (10 MG TOTAL) BY MOUTH DAILY. 30 tablet 0  . fluticasone (FLONASE) 50 MCG/ACT nasal spray Place 2 sprays into both nostrils daily. 16 g 2  . HYDROcodone-acetaminophen (NORCO) 10-325 MG tablet Take 1 tablet by mouth every 4 (four) hours as needed. 25 tablet 0  . Insulin Glargine (LANTUS SOLOSTAR [place]) Inject 50 Units into the skin 2 (two) times daily as needed. Only uses when pump goes out    . Insulin Human (INSULIN PUMP) SOLN Inject 1 each into the skin continuous. Novolog insulin    . meloxicam (MOBIC) 15 MG tablet Take 1 tablet (15 mg total) by mouth daily. 90 tablet 1  . neomycin-polymyxin-hydrocortisone (CORTISPORIN) 3.5-10000-1 ophthalmic suspension 4 (four) times daily.    . ondansetron (ZOFRAN) 4 MG tablet Take 1 tablet (4 mg total) by mouth every 8 (eight) hours as needed for nausea or vomiting. 20 tablet 0  . oxymetazoline (AFRIN) 0.05 % nasal spray Place 1 spray into both nostrils 2 (two) times daily as needed for congestion.    . Pitavastatin Calcium (LIVALO) 2 MG TABS Take 2 mg by mouth daily.     Vladimir Faster Glycol-Propyl Glycol (SYSTANE OP) Place 1 drop into both eyes every 8 (eight) hours as needed (dry eyes).     . Potassium Citrate 15 MEQ (1620 MG) TBCR Take 1 tablet by mouth 2 (two) times daily.    . ramipril (ALTACE) 10 MG tablet Take 10 mg by mouth daily before breakfast.     . sildenafil (REVATIO) 20 MG tablet Take 20 mg by mouth daily as needed (erectile dysfunction).    . tadalafil (CIALIS) 20 MG tablet Take 20 mg by mouth daily as needed for erectile dysfunction.    . tamsulosin (FLOMAX) 0.4 MG CAPS capsule Take 1 capsule (0.4 mg total) by mouth daily. 30 capsule 0   No current facility-administered medications on file prior to visit.   Review of Systems  All otherwise neg per pt     Objective:   Physical Exam BP 150/88 mmHg  Pulse 91  Temp(Src) 98.4 F (36.9 C) (Oral)  Resp 20  Wt 231 lb (  104.781 kg)  SpO2 97% VS noted, not ill appearing Constitutional: Pt appears in no apparent distress HENT: Head: NCAT.  Right Ear: External ear normal.  Left Ear: External ear normal.  Eyes: . Pupils are equal, round, and reactive to light. Conjunctivae and EOM are normal Neck: Normal range of motion. Neck supple.  Cardiovascular: Normal rate and regular rhythm.   Pulmonary/Chest: Effort normal and breath sounds without rales or wheezing.  Neurological: Pt is alert. Not confused , motor 5/5 intact throughtout, has definite sensory deficit to left upper back to the shoulder, DTR's symetric to UEs Skin: Skin is warm. No rash, no LE edema Psychiatric: Pt behavior is normal. No agitation.     Assessment & Plan:

## 2015-12-01 NOTE — Progress Notes (Signed)
Pre visit review using our clinic review tool, if applicable. No additional management support is needed unless otherwise documented below in the visit note. 

## 2015-12-01 NOTE — Assessment & Plan Note (Signed)
Mod pain to left neck with overt sensory deficit only to LT to left upper back, no worsening other radicular pain or UE weakness, for predpac asd, has f/u with Dr Ronnald Ramp June 2, if worsening may need MRI c spine

## 2015-12-01 NOTE — Patient Instructions (Signed)
Please take all new medication as prescribed - the prednisone  Please follow your blood sugars closely, as they may increase to some degree  Please continue all other medications as before, and refills have been done if requested.  Please have the pharmacy call with any other refills you may need.  Please keep your appointments with your specialists as you may have planned  Please keep your appt with Dr Ronnald Ramp on June 2

## 2015-12-01 NOTE — Assessment & Plan Note (Signed)
D/w pt, will have hopefully slight elevation only in cbg's on prednisone, to call for > 200,  to f/u any worsening symptoms or concerns

## 2015-12-04 ENCOUNTER — Ambulatory Visit: Payer: BC Managed Care – PPO | Admitting: Family

## 2015-12-17 ENCOUNTER — Encounter: Payer: Self-pay | Admitting: Internal Medicine

## 2015-12-17 ENCOUNTER — Other Ambulatory Visit (INDEPENDENT_AMBULATORY_CARE_PROVIDER_SITE_OTHER): Payer: BC Managed Care – PPO

## 2015-12-17 ENCOUNTER — Ambulatory Visit (INDEPENDENT_AMBULATORY_CARE_PROVIDER_SITE_OTHER): Payer: BC Managed Care – PPO | Admitting: Internal Medicine

## 2015-12-17 ENCOUNTER — Ambulatory Visit (INDEPENDENT_AMBULATORY_CARE_PROVIDER_SITE_OTHER)
Admission: RE | Admit: 2015-12-17 | Discharge: 2015-12-17 | Disposition: A | Discharge status: Home / Self Care | Payer: BC Managed Care – PPO | Source: Ambulatory Visit | Attending: Internal Medicine | Admitting: Internal Medicine

## 2015-12-17 VITALS — BP 108/70 | HR 74 | Temp 98.7°F | Resp 16 | Ht 73.0 in | Wt 229.0 lb

## 2015-12-17 DIAGNOSIS — D539 Nutritional anemia, unspecified: Secondary | ICD-10-CM

## 2015-12-17 DIAGNOSIS — M542 Cervicalgia: Secondary | ICD-10-CM

## 2015-12-17 DIAGNOSIS — E113299 Type 2 diabetes mellitus with mild nonproliferative diabetic retinopathy without macular edema, unspecified eye: Secondary | ICD-10-CM | POA: Diagnosis not present

## 2015-12-17 DIAGNOSIS — Z1159 Encounter for screening for other viral diseases: Secondary | ICD-10-CM | POA: Insufficient documentation

## 2015-12-17 DIAGNOSIS — Z1211 Encounter for screening for malignant neoplasm of colon: Secondary | ICD-10-CM | POA: Insufficient documentation

## 2015-12-17 DIAGNOSIS — E139 Other specified diabetes mellitus without complications: Secondary | ICD-10-CM

## 2015-12-17 DIAGNOSIS — Z0000 Encounter for general adult medical examination without abnormal findings: Secondary | ICD-10-CM | POA: Diagnosis not present

## 2015-12-17 LAB — URINALYSIS, ROUTINE W REFLEX MICROSCOPIC
Bilirubin Urine: NEGATIVE
Hgb urine dipstick: NEGATIVE
Ketones, ur: NEGATIVE
Leukocytes, UA: NEGATIVE
Nitrite: NEGATIVE
Specific Gravity, Urine: 1.015 (ref 1.000–1.030)
Total Protein, Urine: NEGATIVE
Urine Glucose: >=1000 — AB
Urobilinogen, UA: 0.2 (ref 0.0–1.0)
pH: 5.5 (ref 5.0–8.0)

## 2015-12-17 LAB — CBC WITH DIFFERENTIAL/PLATELET
Basophils Absolute: 0.1 10*3/uL (ref 0.0–0.1)
Basophils Relative: 0.5 % (ref 0.0–3.0)
Eosinophils Absolute: 0.7 10*3/uL (ref 0.0–0.7)
Eosinophils Relative: 5.4 % — ABNORMAL HIGH (ref 0.0–5.0)
HCT: 50.4 % (ref 39.0–52.0)
Hemoglobin: 16.7 g/dL (ref 13.0–17.0)
Lymphocytes Relative: 23.8 % (ref 12.0–46.0)
Lymphs Abs: 3.2 10*3/uL (ref 0.7–4.0)
MCHC: 33.2 g/dL (ref 30.0–36.0)
MCV: 88.5 fl (ref 78.0–100.0)
Monocytes Absolute: 0.9 10*3/uL (ref 0.1–1.0)
Monocytes Relative: 6.6 % (ref 3.0–12.0)
Neutro Abs: 8.6 10*3/uL — ABNORMAL HIGH (ref 1.4–7.7)
Neutrophils Relative %: 63.7 % (ref 43.0–77.0)
Platelets: 179 10*3/uL (ref 150.0–400.0)
RBC: 5.69 Mil/uL (ref 4.22–5.81)
RDW: 15.6 % — ABNORMAL HIGH (ref 11.5–15.5)
WBC: 13.5 10*3/uL — ABNORMAL HIGH (ref 4.0–10.5)

## 2015-12-17 LAB — COMPREHENSIVE METABOLIC PANEL
ALT: 18 U/L (ref 0–53)
AST: 13 U/L (ref 0–37)
Albumin: 4.4 g/dL (ref 3.5–5.2)
Alkaline Phosphatase: 100 U/L (ref 39–117)
BUN: 17 mg/dL (ref 6–23)
CO2: 29 mEq/L (ref 19–32)
Calcium: 9.4 mg/dL (ref 8.4–10.5)
Chloride: 107 mEq/L (ref 96–112)
Creatinine, Ser: 1.02 mg/dL (ref 0.40–1.50)
GFR: 94.78 mL/min (ref >60.00)
Glucose, Bld: 90 mg/dL (ref 70–99)
Potassium: 4.3 mEq/L (ref 3.5–5.1)
Sodium: 141 mEq/L (ref 135–145)
Total Bilirubin: 0.6 mg/dL (ref 0.2–1.2)
Total Protein: 7.2 g/dL (ref 6.0–8.3)

## 2015-12-17 LAB — IBC PANEL
Iron: 63 ug/dL (ref 42–165)
Saturation Ratios: 18.6 % — ABNORMAL LOW (ref 20.0–50.0)
Transferrin: 242 mg/dL (ref 212.0–360.0)

## 2015-12-17 LAB — FECAL OCCULT BLOOD, GUAIAC: Fecal Occult Blood: NEGATIVE

## 2015-12-17 LAB — RETICULOCYTES
ABS Retic: 62480 cells/uL (ref 25000–90000)
RBC.: 5.68 MIL/uL (ref 4.20–5.80)
Retic Ct Pct: 1.1 %

## 2015-12-17 LAB — HEMOGLOBIN A1C: Hgb A1c MFr Bld: 7.4 % — ABNORMAL HIGH (ref 4.6–6.5)

## 2015-12-17 LAB — FERRITIN: Ferritin: 31.3 ng/mL (ref 22.0–322.0)

## 2015-12-17 LAB — LIPID PANEL
Cholesterol: 121 mg/dL (ref 0–200)
HDL: 44.6 mg/dL (ref >39.00)
LDL Cholesterol: 64 mg/dL (ref 0–99)
NonHDL: 76.4
Total CHOL/HDL Ratio: 3
Triglycerides: 63 mg/dL (ref 0.0–149.0)
VLDL: 12.6 mg/dL (ref 0.0–40.0)

## 2015-12-17 LAB — MICROALBUMIN / CREATININE URINE RATIO
Creatinine,U: 115.5 mg/dL
Microalb Creat Ratio: 2.3 mg/g (ref 0.0–30.0)
Microalb, Ur: 2.6 mg/dL — ABNORMAL HIGH (ref 0.0–1.9)

## 2015-12-17 LAB — FOLATE: Folate: 18.2 ng/mL (ref >5.9)

## 2015-12-17 LAB — VITAMIN B12: Vitamin B-12: >1500 pg/mL — ABNORMAL HIGH (ref 211–911)

## 2015-12-17 LAB — TSH: TSH: 0.59 u[IU]/mL (ref 0.35–4.50)

## 2015-12-17 LAB — PSA: PSA: 3.58 ng/mL (ref 0.10–4.00)

## 2015-12-17 NOTE — Progress Notes (Signed)
Pre visit review using our clinic review tool, if applicable. No additional management support is needed unless otherwise documented below in the visit note. 

## 2015-12-17 NOTE — Patient Instructions (Signed)

## 2015-12-17 NOTE — Progress Notes (Signed)
Subjective:  [name]., male    DOB: 08/17/1950  Age: 65 y.o. MRN: [id number]  CC: Annual Exam; Anemia; Diabetes; Hyperlipidemia; and Neck Pain   HPI Anthony Daniel. presents for a CPX.  He complains of a several month history of left-sided neck and shoulder pain. He was seen here a few weeks ago and it was recommended that he take a course of prednisone but he did not take it because he was concerned it would affect his blood sugar. He complains of a discomfort on the left side of his neck that radiates to the top of his left shoulder where he experiences a stiffness and numbness. There is no pain that radiates into his left upper extremity and he denies paresthesias into his left arm. He has had no recent trauma or injury.  He thinks his blood sugars have been well controlled as he denies polyuria, polydipsia, and polyphagia.  He is due for follow-up on anemia, he has no signs or symptoms of anemia or blood loss.    Past Medical History  Diagnosis Date  . Hyperlipidemia   . Hypertension   . History of bladder cancer     1998--  [facility]  . Nephrolithiasis     bilateral --  nonobstructive per CT  . History of kidney stones   . LADA (latent autoimmune diabetes in adults), managed as type 1 [facility])    Past Surgical History  Procedure Laterality Date  . Tonsillectomy  1975  . Laparoscopic appendectomy N/A 11/07/2012    Procedure: APPENDECTOMY LAPAROSCOPIC;  Surgeon: Harl Bowie, MD;  Location: [place];  Service: General;  Laterality: N/A;  . Cystoscopy with ureteroscopy and stent placement Right 04/23/2015    Procedure: CYSTOSCOPY WITH URETEROSCOPY RETROGRADE PYELOGRAM AND STENT PLACEMENT;  Surgeon: Cleon Gustin, MD;  Location: [facility];  Service: Urology;  Laterality: Right;  . Transurethral resection of bladder tumor  1998  . Extracorporeal shock wave lithotripsy  x2 in  2006//   x2  in 2012  . Cystoscopy/retrograde/ureteroscopy/stone extraction with basket  Right 05/12/2015    Procedure: CYSTOSCOPY/RETROGRADE/URETEROSCOPY/STONE EXTRACTION WITH BASKET;  Surgeon: Cleon Gustin, MD;  Location: [facility];  Service: Urology;  Laterality: Right;  . Cystoscopy w/ ureteral stent placement Right 05/12/2015    Procedure: CYSTOSCOPY WITH STENT REPLACEMENT;  Surgeon: Cleon Gustin, MD;  [facility];  Service: Urology;  Laterality: Right;  . Holmium laser application Right [id number]    Procedure: HOLMIUM LASER APPLICATION;  Surgeon: Cleon Gustin, MD;  Location: [facility];  Service: Urology;  Laterality: Right;  . Nephrolithotomy Left 08/06/2015    Procedure: 1ST STAGE  LEFT PERCUTANEOUS NEPHROLITHOTOMY ;  Surgeon: Irine Seal, MD;  Location: [facility];  Service: Urology;  Laterality: Left;    reports that he has been smoking Cigarettes.  He has a 30 pack-year smoking history. He has never used smokeless tobacco. He reports that he drinks about 3.0 oz of alcohol per week. He reports that he does not use illicit drugs. family history includes Cancer in his other; Diabetes in his other; Hypertension in his other. There is no history of Alcohol abuse, Drug abuse, Early death, Heart disease, Hyperlipidemia, Kidney disease, or Stroke. Allergies  Allergen Reactions  . Crestor [Rosuvastatin] Other (See Comments)    Muscle aches, cramps  . Lipitor [Atorvastatin] Other (See Comments)    Muscle aches, cramps  . Contrast Media [Iodinated Diagnostic Agents] Itching and  Nausea And Vomiting    PT TRIED THIS TWICE.EVEN WITH PRE-MEDS AND STILL HAD NAUSEA AND ITCHING--WAS TOLD HE CAN NOT TAKE iv ANYMORE  . Latex Other (See Comments)    Irritation  . Oxycodone Nausea Only    Pt can tolerate hydrocodone   . Adhesive [Tape] Rash  . Peanut-Containing Drug Products Anxiety and Other (See Comments)    Jittery    Outpatient Prescriptions Prior to Visit  Medication Sig Dispense Refill  . aspirin EC 81 MG  tablet Take 81 mg by mouth daily.    . Cholecalciferol (VITAMIN D) 2000 units tablet Take 2,000 Units by mouth daily.    . Ciclopirox 0.77 % gel APPLY TO AFFECTED AREA(S) TWICE DAILY 30 g 3  . Cyanocobalamin 3000 MCG CAPS Take 3,000 mcg by mouth daily.    . cyclobenzaprine (FLEXERIL) 10 MG tablet Take 1 tablet (10 mg total) by mouth at bedtime as needed for muscle spasms. 14 tablet 0  . dapagliflozin propanediol (FARXIGA) 10 MG TABS tablet Take 10 mg by mouth daily. Yearly physical w/labs due in May must see md for refills 90 tablet 0  . diclofenac sodium (VOLTAREN) 1 % GEL Apply 4 g topically 4 (four) times daily. 1 Tube 3  . diphenhydrAMINE (BENADRYL) 50 MG tablet Take 50 mg by mouth at bedtime as needed for itching.    . ezetimibe (ZETIA) 10 MG tablet TAKE 1 TABLET (10 MG TOTAL) BY MOUTH DAILY. 30 tablet 0  . fluticasone (FLONASE) 50 MCG/ACT nasal spray Place 2 sprays into both nostrils daily. 16 g 2  . Insulin Glargine (LANTUS SOLOSTAR [place]) Inject 50 Units into the skin 2 (two) times daily as needed. Only uses when pump goes out    . Insulin Human (INSULIN PUMP) SOLN Inject 1 each into the skin continuous. Novolog insulin    . meloxicam (MOBIC) 15 MG tablet Take 1 tablet (15 mg total) by mouth daily. 90 tablet 1  . neomycin-polymyxin-hydrocortisone (CORTISPORIN) 3.5-10000-1 ophthalmic suspension 4 (four) times daily.    Marland Kitchen oxymetazoline (AFRIN) 0.05 % nasal spray Place 1 spray into both nostrils 2 (two) times daily as needed for congestion.    . Pitavastatin Calcium (LIVALO) 2 MG TABS Take 2 mg by mouth daily.     Vladimir Faster Glycol-Propyl Glycol (SYSTANE OP) Place 1 drop into both eyes every 8 (eight) hours as needed (dry eyes).    . Potassium Citrate 15 MEQ (1620 MG) TBCR Take 1 tablet by mouth 2 (two) times daily.    . ramipril (ALTACE) 10 MG tablet Take 10 mg by mouth daily before breakfast.     . sildenafil (REVATIO) 20 MG tablet Take 20 mg by mouth daily as needed (erectile dysfunction).     . tadalafil (CIALIS) 20 MG tablet Take 20 mg by mouth daily as needed for erectile dysfunction.    . tamsulosin (FLOMAX) 0.4 MG CAPS capsule Take 1 capsule (0.4 mg total) by mouth daily. 30 capsule 0  . HYDROcodone-acetaminophen (NORCO) 10-325 MG tablet Take 1 tablet by mouth every 4 (four) hours as needed. (Patient not taking: Reported on 12/17/2015) 25 tablet 0  . ondansetron (ZOFRAN) 4 MG tablet Take 1 tablet (4 mg total) by mouth every 8 (eight) hours as needed for nausea or vomiting. (Patient not taking: Reported on 12/17/2015) 20 tablet 0  . cephALEXin (KEFLEX) 500 MG capsule Take 1 capsule (500 mg total) by mouth 3 (three) times daily. 12 capsule 0  . predniSONE (DELTASONE) 20 MG tablet  Take 1 tablet (20 mg total) by mouth daily with breakfast. 7 tablet 0   No facility-administered medications prior to visit.    ROS Review of Systems  Constitutional: Negative.  Negative for fever, chills, diaphoresis, appetite change and fatigue.  HENT: Negative.  Negative for sore throat and trouble swallowing.   Eyes: Negative.  Negative for photophobia and visual disturbance.  Respiratory: Negative.  Negative for cough, choking, chest tightness, shortness of breath and stridor.   Cardiovascular: Negative.  Negative for chest pain, palpitations and leg swelling.  Gastrointestinal: Negative.  Negative for nausea, vomiting, abdominal pain, diarrhea, constipation and blood in stool.  Endocrine: Negative.   Genitourinary: Negative.  Negative for dysuria, urgency, frequency, hematuria, flank pain, penile swelling, scrotal swelling, difficulty urinating, genital sores and testicular pain.  Musculoskeletal: Positive for neck pain. Negative for myalgias, back pain, arthralgias, gait problem and neck stiffness.  Skin: Negative.  Negative for color change and pallor.  Allergic/Immunologic: Negative.   Neurological: Negative.  Negative for dizziness, tremors, seizures, syncope, facial asymmetry, numbness and  headaches.  Hematological: Negative.  Negative for adenopathy. Does not bruise/bleed easily.  Psychiatric/Behavioral: Negative.     Objective:  BP 108/70 mmHg  Pulse 74  Temp(Src) 98.7 F (37.1 C) (Oral)  Resp 16  Ht 6\' 1"  (1.854 m)  Wt 229 lb (103.874 kg)  BMI 30.22 kg/m2  SpO2 96%  BP Readings from Last 3 Encounters:  12/17/15 108/70  12/01/15 150/88  11/16/15 144/80    Wt Readings from Last 3 Encounters:  12/17/15 229 lb (103.874 kg)  12/01/15 231 lb (104.781 kg)  11/16/15 236 lb (107.049 kg)    Physical Exam  Constitutional: He is oriented to person, place, and time. He appears well-developed and well-nourished. No distress.  HENT:  Head: Normocephalic and atraumatic.  Mouth/Throat: Oropharynx is clear and moist. No oropharyngeal exudate.  Eyes: Conjunctivae are normal. Right eye exhibits no discharge. Left eye exhibits no discharge. No scleral icterus.  Neck: Normal range of motion. Neck supple. No JVD present. No tracheal deviation present. No thyromegaly present.  Cardiovascular: Normal rate, regular rhythm, normal heart sounds and intact distal pulses.  Exam reveals no gallop and no friction rub.   No murmur heard. Pulmonary/Chest: Effort normal and breath sounds normal. No stridor. No respiratory distress. He has no wheezes. He has no rales. He exhibits no tenderness.  Abdominal: Soft. Bowel sounds are normal. He exhibits no distension and no mass. There is no tenderness. There is no rebound and no guarding. Hernia confirmed negative in the right inguinal area and confirmed negative in the left inguinal area.  Genitourinary: Testes normal and penis normal. Rectal exam shows external hemorrhoid (small, uncomplicated external anal hemorrhoids). Rectal exam shows no internal hemorrhoid, no fissure, no mass, no tenderness and anal tone normal. Guaiac negative stool. Prostate is enlarged (2+ smooth symm BPH). Prostate is not tender. Right testis shows no mass, no swelling  and no tenderness. Right testis is descended. Left testis shows no mass, no swelling and no tenderness. Left testis is descended. Circumcised. No penile erythema or penile tenderness. No discharge found.  Musculoskeletal: Normal range of motion. He exhibits no edema or tenderness.       Cervical back: Normal. He exhibits normal range of motion, no tenderness, no bony tenderness, no swelling, no edema, no deformity, no laceration, no pain and no spasm.  Lymphadenopathy:    He has no cervical adenopathy.       Right: No inguinal adenopathy present.  Left: No inguinal adenopathy present.  Neurological: He is oriented to person, place, and time. He has normal strength. He displays no atrophy, no tremor and normal reflexes. No cranial nerve deficit or sensory deficit. He exhibits normal muscle tone. He displays a negative Romberg sign. He displays no seizure activity. Coordination and gait normal.  Skin: Skin is warm and dry. No rash noted. He is not diaphoretic. No erythema. No pallor.  Vitals reviewed.   Lab Results  Component Value Date   WBC 13.5* 12/17/2015   HGB 16.7 12/17/2015   HCT 50.4 12/17/2015   PLT 179.0 12/17/2015   GLUCOSE 90 12/17/2015   CHOL 121 12/17/2015   TRIG 63.0 12/17/2015   HDL 44.60 12/17/2015   LDLCALC 64 12/17/2015   ALT 18 12/17/2015   AST 13 12/17/2015   NA 141 12/17/2015   K 4.3 12/17/2015   CL 107 12/17/2015   CREATININE 1.02 12/17/2015   BUN 17 12/17/2015   CO2 29 12/17/2015   TSH 0.59 12/17/2015   PSA 3.58 12/17/2015   INR 1.02 08/06/2015   HGBA1C 7.4* 12/17/2015   MICROALBUR 2.6* 12/17/2015    Ct Abdomen Wo Contrast  08/07/2015  CLINICAL DATA:  Left flank pain right-sided nephrostomy, hematuria, postop day 1. Evaluate for residual stone. History of bladder cancer. EXAM: CT ABDOMEN WITHOUT CONTRAST TECHNIQUE: Multidetector CT imaging of the abdomen was performed following the standard protocol without IV contrast. COMPARISON:  CT abdomen pelvis  dated 04/23/2015 FINDINGS: Lower chest: Mild patchy opacity in the posterior left lung base (series 4/image 13), likely rounded atelectasis. Additional mild dependent atelectasis at the right lung base. Hepatobiliary: Unenhanced liver is unremarkable. Gallbladder is notable for a layering gallstones in the gallbladder fundus (series 2/ image 33). No associated inflammatory changes. Pancreas: Within normal limits. Spleen: Within normal limits. Adrenals/Urinary Tract: Adrenal glands are within normal limits. Status post left nephrostomy with nephroureteral stent. 5-6 nonobstructing left renal calculi, most of which measure 1-2 mm, with a 3 mm calculus in the posterior interpolar region (series 2/ image 30). No hydronephrosis. 12 mm left upper pole renal cyst (series 2/ image 24). Mild left perinephric stranding, likely postprocedural (series 2/image 44). Two nonobstructing right renal calculi measuring up to 5 mm in the right lower pole (series 2/ image 38). No hydronephrosis. Stomach/Bowel: Stomach is within normal limits. Visualized bowel is unremarkable. Vascular/Lymphatic: Atherosclerotic calcifications of the abdominal aorta and branch vessels. No suspicious abdominal lymphadenopathy. Other: No abdominal ascites. Musculoskeletal: No focal osseous lesions. IMPRESSION: Status post left nephrostomy with nephroureteral stent. 5-6 nonobstructing left renal calculi, measuring up to 3 mm in the interpolar region. Two nonobstructing right renal calculi measuring up to 5 mm in the right lower pole. No hydronephrosis. Cholelithiasis, without associated inflammatory changes. Electronically Signed   By: Julian Hy M.D.   On: 08/07/2015 08:25   Dg C-arm 1-60 Min-no Report  08/06/2015  CLINICAL DATA: surgery C-ARM 1-60 MINUTES Fluoroscopy was utilized by the requesting physician.  No radiographic interpretation.   Ir Ureteral Stent Left New Access W/o Sep Nephrostomy Cath  08/06/2015  CLINICAL DATA:  Renal stones,  access for left percutaneous nephrolithotomy. EXAM: IR URETERAL STENT LEFT NEW ACCESS W/O SEP NEPHROSTOMY CATH Date: 08/06/2015 PROCEDURE: 1. Percutaneous puncture of the renal collecting system under fluoroscopic guidance 2. Placement of a percutaneous nephrostomy tube Operating Physician:  Criselda Peaches, MD COMPARISON:  CT of the abdomen and pelvis - 04/23/2015 ANESTHESIA/SEDATION: Moderate (conscious) sedation was used. 4 mg Versed, 175 mcg Fentanyl  were administered intravenously. The patient's vital signs were monitored continuously by radiology nursing throughout the procedure. 400 mg Cipro was administered IV at an appropriate time prior to skin puncture. Sedation Time: 31 minutes FLUOROSCOPY TIME:  10 minutes 6 seconds for a total of 471 mGy CONTRAST:  20 mL instilled into the collecting system. TECHNIQUE: Informed written consent was obtained from the patient after a discussion of the risks, benefits, and alternatives to treatment. The left flank region was prepped with Betadine in a sterile fashion, and a sterile drape was applied covering the operative field. A sterile gown and sterile gloves were used for the procedure. A timeout was performed prior to the initiation of the procedure. A pre procedural spot fluoroscopic image was obtained of the upper abdomen. Ultrasound scanning performed of the kidney was negative for significant hydronephrosis. As such, the stone within an inter to lower pole posterior calyx was targeted fluoroscopically with a [place] needle. Access to the collecting system was confirmed with aspiration of clear urine followed by injection of contrast material. A Nitrex wire was then advanced into the collecting system. The needle was exchanged for the Accustick set and additional contrast injection confirmed access. A small amount of air was injected into the collecting system to demonstrate access in the posterior calyx. Using a glidewire an angled 4 French catheter, the  wire was successfully navigated down the ureter and into the bladder. The 4 French catheter and Accustick sheath were removed. A 5 [place] Kumpe the catheter was advanced over the wire. The Kumpe catheter was advanced down the ureter and into the urinary bladder. Postprocedural spot radiographs were obtained in various obliquities and the catheter was sutured to the skin. The catheter was capped and a dressing was placed. The patient tolerated the procedure well without immediate postprocedural complication. COMPLICATIONS: None immediate IMPRESSION: Successful fluoroscopic guided left percutaneous nephrostomy with placement of a 5 French Kumpe catheter to the level of the urinary bladder to be utilized during impending nephrolithotomy procedure. Signed, Criselda Peaches, MD [facility] Electronically Signed   By: Jacqulynn Cadet M.D.   On: 08/06/2015 11:14   Dg Cervical Spine Complete  12/17/2015  CLINICAL DATA:  Acute left arm shoulder pain for 1 month. No known injury. EXAM: CERVICAL SPINE - COMPLETE 4+ VIEW COMPARISON:  11/10/2014 FINDINGS: Normal lumbar spine alignment. Preserved vertebral body heights. Minor cervical degenerative disc disease and spondylosis at C3-4, C5-6, and C6-7 with slight disc space narrowing, endplate sclerosis and anterior bony spurring. Facets aligned. Normal prevertebral soft tissues. Foramina appear patent. Odontoid is intact. Trachea is midline. Lung apices are clear. IMPRESSION: Minor cervical degenerative changes. No acute finding by plain radiography Electronically Signed   By: Jerilynn Mages.  Shick M.D.   On: 12/17/2015 14:27   Assessment & Plan:   Kellis was seen today for annual exam, anemia, diabetes, hyperlipidemia and neck pain.  Diagnoses and all orders for this visit:  Background diabetic retinopathy ([place])  LADA (latent autoimmune diabetes in adults), managed as type 1 ([place])- his A1c is up to 7.4%, he will  continue to see his endocrinologist to manage his blood sugars -     Hemoglobin A1c; Future -     Microalbumin / creatinine urine ratio; Future  Routine general medical examination at a health care facility- exam completed, labs ordered and reviewed, he was referred for a screening colonoscopy, vaccines were reviewed, patient education material was given. -     Lipid  panel; Future -     Comprehensive metabolic panel; Future -     CBC with Differential/Platelet; Future -     TSH; Future -     Urinalysis, Routine w reflex microscopic (not at [facility]); Future -     PSA; Future -     HIV antibody; Future  Need for hepatitis C screening test -     Hepatitis C antibody; Future  Deficiency anemia- improvement noted, his vitamin levels are normal -     IBC panel; Future -     Vitamin B12; Future -     Ferritin; Future -     Folate; Future -     Reticulocytes; Future  Neck pain on left side- plain films and exam are consistent with degenerative disc disease, he will control the discomfort with over-the-counter meds such as Tylenol and ibuprofen. -     DG Cervical Spine Complete; Future  Screen for colon cancer -     Ambulatory referral to Gastroenterology   I have discontinued Mr. Choice cephALEXin and predniSONE. I am also having him maintain his ramipril, Insulin Glargine (LANTUS SOLOSTAR [place]), Potassium Citrate, tadalafil, aspirin EC, Pitavastatin Calcium, oxymetazoline, tamsulosin, Cyanocobalamin, Polyethyl Glycol-Propyl Glycol (SYSTANE OP), insulin pump, sildenafil, Vitamin D, fluticasone, diphenhydrAMINE, neomycin-polymyxin-hydrocortisone, ondansetron, HYDROcodone-acetaminophen, Ciclopirox, dapagliflozin propanediol, ezetimibe, cyclobenzaprine, diclofenac sodium, meloxicam, and NOVOLOG.  Meds ordered this encounter  Medications  . NOVOLOG 100 UNIT/ML injection    Sig:      Follow-up: Return in about 3 months (around 03/18/2016).  Scarlette Calico, MD

## 2015-12-18 LAB — HIV ANTIBODY (ROUTINE TESTING W REFLEX): HIV 1&2 Ab, 4th Generation: NONREACTIVE

## 2015-12-18 LAB — HEPATITIS C ANTIBODY: HCV Ab: NEGATIVE

## 2015-12-21 ENCOUNTER — Encounter: Payer: Self-pay | Admitting: Internal Medicine

## 2015-12-21 ENCOUNTER — Other Ambulatory Visit: Payer: Self-pay | Admitting: Internal Medicine

## 2015-12-21 DIAGNOSIS — M542 Cervicalgia: Secondary | ICD-10-CM

## 2015-12-26 ENCOUNTER — Encounter: Payer: Self-pay | Admitting: Internal Medicine

## 2016-01-04 ENCOUNTER — Other Ambulatory Visit: Payer: Self-pay | Admitting: Internal Medicine

## 2016-01-11 ENCOUNTER — Other Ambulatory Visit: Payer: Self-pay | Admitting: Internal Medicine

## 2016-01-20 ENCOUNTER — Other Ambulatory Visit: Payer: Self-pay | Admitting: Internal Medicine

## 2016-01-25 LAB — TSH: TSH: 0.67 u[IU]/mL (ref <=5.90)

## 2016-02-29 ENCOUNTER — Encounter: Payer: Self-pay | Admitting: Internal Medicine

## 2016-03-22 ENCOUNTER — Other Ambulatory Visit (INDEPENDENT_AMBULATORY_CARE_PROVIDER_SITE_OTHER): Payer: BC Managed Care – PPO

## 2016-03-22 ENCOUNTER — Ambulatory Visit (INDEPENDENT_AMBULATORY_CARE_PROVIDER_SITE_OTHER): Payer: BC Managed Care – PPO | Admitting: Internal Medicine

## 2016-03-22 ENCOUNTER — Encounter: Payer: Self-pay | Admitting: Internal Medicine

## 2016-03-22 VITALS — BP 130/80 | HR 87 | Temp 98.0°F | Resp 16 | Ht 73.0 in | Wt 241.2 lb

## 2016-03-22 DIAGNOSIS — R972 Elevated prostate specific antigen [PSA]: Secondary | ICD-10-CM | POA: Diagnosis not present

## 2016-03-22 DIAGNOSIS — E139 Other specified diabetes mellitus without complications: Secondary | ICD-10-CM

## 2016-03-22 DIAGNOSIS — Z23 Encounter for immunization: Secondary | ICD-10-CM

## 2016-03-22 LAB — BASIC METABOLIC PANEL
BUN: 18 mg/dL (ref 6–23)
CO2: 31 mEq/L (ref 19–32)
Calcium: 8.8 mg/dL (ref 8.4–10.5)
Chloride: 105 mEq/L (ref 96–112)
Creatinine, Ser: 1.01 mg/dL (ref 0.40–1.50)
GFR: 95.78 mL/min (ref >60.00)
Glucose, Bld: 131 mg/dL — ABNORMAL HIGH (ref 70–99)
Potassium: 4.6 mEq/L (ref 3.5–5.1)
Sodium: 138 mEq/L (ref 135–145)

## 2016-03-22 NOTE — Progress Notes (Signed)
Subjective:  [name]., male    DOB: [birth year]  Age: 65 y.o. MRN: [id number]  CC: Diabetes   HPI Anthony Daniel. presents for follow-up on his PSA. It has increased slightly over the last year. He sees urology every 4-6 months regarding a staghorn calculus and says he sees his urologist again in about 3 months. He denies any symptoms of obstructive uropathy. He also tells me he feels like his blood sugars are well-controlled.  Outpatient Medications Prior to Visit  Medication Sig Dispense Refill  . aspirin EC 81 MG tablet Take 81 mg by mouth daily.    . Cholecalciferol (VITAMIN D) 2000 units tablet Take 2,000 Units by mouth daily.    . Ciclopirox 0.77 % gel APPLY TO AFFECTED AREA(S) TWICE DAILY 30 g 3  . Cyanocobalamin 3000 MCG CAPS Take 3,000 mcg by mouth daily.    . dapagliflozin propanediol (FARXIGA) 10 MG TABS tablet Take 10 mg by mouth daily. 90 tablet 2  . diclofenac sodium (VOLTAREN) 1 % GEL Apply 4 g topically 4 (four) times daily. 1 Tube 3  . ezetimibe (ZETIA) 10 MG tablet TAKE 1 TABLET (10 MG TOTAL) BY MOUTH DAILY. 30 tablet 5  . fluticasone (FLONASE) 50 MCG/ACT nasal spray Place 2 sprays into both nostrils daily. 16 g 2  . Insulin Glargine (LANTUS SOLOSTAR [place]) Inject 50 Units into the skin 2 (two) times daily as needed. Only uses when pump goes out    . Insulin Human (INSULIN PUMP) SOLN Inject 1 each into the skin continuous. Novolog insulin    . meloxicam (MOBIC) 15 MG tablet Take 1 tablet (15 mg total) by mouth daily. 90 tablet 1  . NOVOLOG 100 UNIT/ML injection     . Pitavastatin Calcium (LIVALO) 2 MG TABS Take 2 mg by mouth daily.     Vladimir Faster Glycol-Propyl Glycol (SYSTANE OP) Place 1 drop into both eyes every 8 (eight) hours as needed (dry eyes).    . Potassium Citrate 15 MEQ (1620 MG) TBCR Take 1 tablet by mouth 2 (two) times daily.    . ramipril (ALTACE) 10 MG tablet Take 10 mg by mouth daily before breakfast.     . sildenafil (REVATIO) 20 MG  tablet Take 20 mg by mouth daily as needed (erectile dysfunction).    . tamsulosin (FLOMAX) 0.4 MG CAPS capsule Take 1 capsule (0.4 mg total) by mouth daily. 30 capsule 0  . cyclobenzaprine (FLEXERIL) 10 MG tablet Take 1 tablet (10 mg total) by mouth at bedtime as needed for muscle spasms. 14 tablet 0  . diphenhydrAMINE (BENADRYL) 50 MG tablet Take 50 mg by mouth at bedtime as needed for itching.    Marland Kitchen HYDROcodone-acetaminophen (NORCO) 10-325 MG tablet Take 1 tablet by mouth every 4 (four) hours as needed. 25 tablet 0  . neomycin-polymyxin-hydrocortisone (CORTISPORIN) 3.5-10000-1 ophthalmic suspension 4 (four) times daily.    . ondansetron (ZOFRAN) 4 MG tablet Take 1 tablet (4 mg total) by mouth every 8 (eight) hours as needed for nausea or vomiting. 20 tablet 0  . oxymetazoline (AFRIN) 0.05 % nasal spray Place 1 spray into both nostrils 2 (two) times daily as needed for congestion.    . tadalafil (CIALIS) 20 MG tablet Take 20 mg by mouth daily as needed for erectile dysfunction.     No facility-administered medications prior to visit.     ROS Review of Systems  Constitutional: Negative.  Negative for activity change, appetite change, fatigue and unexpected weight  change.  HENT: Negative.   Eyes: Negative.  Negative for visual disturbance.  Respiratory: Negative for cough, choking, chest tightness, shortness of breath and stridor.   Cardiovascular: Negative for chest pain, palpitations and leg swelling.  Gastrointestinal: Negative.  Negative for abdominal pain, constipation, diarrhea, nausea and vomiting.  Endocrine: Negative for polydipsia, polyphagia and polyuria.  Genitourinary: Negative.  Negative for decreased urine volume, difficulty urinating, dysuria, enuresis, flank pain, hematuria and urgency.  Musculoskeletal: Negative.  Negative for arthralgias, back pain, myalgias and neck pain.  Skin: Negative.  Negative for color change and rash.  Allergic/Immunologic: Negative.     Neurological: Negative.  Negative for dizziness, weakness and numbness.  Hematological: Negative.  Negative for adenopathy. Does not bruise/bleed easily.  Psychiatric/Behavioral: Negative.     Objective:  BP 130/80 (BP Location: Left Arm, Patient Position: Sitting, Cuff Size: Large)   Pulse 87   Temp 98 F (36.7 C) (Oral)   Resp 16   Ht 6\' 1"  (1.854 m)   Wt 241 lb 4 oz (109.4 kg)   SpO2 97%   BMI 31.83 kg/m   BP Readings from Last 3 Encounters:  03/22/16 130/80  12/17/15 108/70  12/01/15 (!) 150/88    Wt Readings from Last 3 Encounters:  03/22/16 241 lb 4 oz (109.4 kg)  12/17/15 229 lb (103.9 kg)  12/01/15 231 lb (104.8 kg)    Physical Exam  Constitutional: He is oriented to person, place, and time. No distress.  HENT:  Mouth/Throat: Oropharynx is clear and moist. No oropharyngeal exudate.  Eyes: Conjunctivae are normal. Right eye exhibits no discharge. Left eye exhibits no discharge. No scleral icterus.  Neck: Normal range of motion. Neck supple. No JVD present. No tracheal deviation present. No thyromegaly present.  Cardiovascular: Normal rate, regular rhythm, normal heart sounds and intact distal pulses.  Exam reveals no gallop and no friction rub.   No murmur heard. Pulmonary/Chest: Effort normal and breath sounds normal. No stridor. No respiratory distress. He has no wheezes. He has no rales. He exhibits no tenderness.  Abdominal: Soft. Bowel sounds are normal. He exhibits no distension and no mass. There is no tenderness. There is no rebound and no guarding.  Musculoskeletal: Normal range of motion. He exhibits no edema, tenderness or deformity.  Lymphadenopathy:    He has no cervical adenopathy.  Neurological: He is oriented to person, place, and time.  Skin: Skin is warm and dry. No rash noted. He is not diaphoretic. No erythema. No pallor.  Vitals reviewed.   Lab Results  Component Value Date   WBC 13.5 (H) 12/17/2015   HGB 16.7 12/17/2015   HCT 50.4  12/17/2015   PLT 179.0 12/17/2015   GLUCOSE 131 (H) 03/22/2016   CHOL 121 12/17/2015   TRIG 63.0 12/17/2015   HDL 44.60 12/17/2015   LDLCALC 64 12/17/2015   ALT 18 12/17/2015   AST 13 12/17/2015   NA 138 03/22/2016   K 4.6 03/22/2016   CL 105 03/22/2016   CREATININE 1.01 03/22/2016   BUN 18 03/22/2016   CO2 31 03/22/2016   TSH 0.67 01/25/2016   PSA 3.58 12/17/2015   INR 1.02 08/06/2015   HGBA1C 7.4 (H) 12/17/2015   MICROALBUR 2.6 (H) 12/17/2015    Dg Cervical Spine Complete  Result Date: 12/17/2015 CLINICAL DATA:  Acute left arm shoulder pain for 1 month. No known injury. EXAM: CERVICAL SPINE - COMPLETE 4+ VIEW COMPARISON:  11/10/2014 FINDINGS: Normal lumbar spine alignment. Preserved vertebral body heights. Minor cervical degenerative  disc disease and spondylosis at C3-4, C5-6, and C6-7 with slight disc space narrowing, endplate sclerosis and anterior bony spurring. Facets aligned. Normal prevertebral soft tissues. Foramina appear patent. Odontoid is intact. Trachea is midline. Lung apices are clear. IMPRESSION: Minor cervical degenerative changes. No acute finding by plain radiography Electronically Signed   By: Jerilynn Mages.  Shick M.D.   On: 12/17/2015 14:27    Assessment & Plan:   Dristin was seen today for diabetes.  Diagnoses and all orders for this visit:  PSA elevation- his A1c is up to 4, this is not compelling evidence for concern about prostate cancer but he will discuss it with his urologist during his next appointment. -     PSA, total and free; Future  LADA (latent autoimmune diabetes in adults), managed as type 1 ([place])- blood sugar is adequately well controlled, renal function is stable. -     Basic metabolic panel; Future  Need for prophylactic vaccination and inoculation against influenza -     Flu Vaccine QUAD 36+ mos IM   I have discontinued Mr. Comstock tadalafil, oxymetazoline, diphenhydrAMINE, neomycin-polymyxin-hydrocortisone, ondansetron,  HYDROcodone-acetaminophen, and cyclobenzaprine. I am also having him maintain his ramipril, Insulin Glargine (LANTUS SOLOSTAR [place]), Potassium Citrate, aspirin EC, Pitavastatin Calcium, tamsulosin, Cyanocobalamin, Polyethyl Glycol-Propyl Glycol (SYSTANE OP), insulin pump, sildenafil, Vitamin D, fluticasone, diclofenac sodium, meloxicam, NOVOLOG, dapagliflozin propanediol, ezetimibe, and Ciclopirox.  No orders of the defined types were placed in this encounter.    Follow-up: Return in about 6 months (around 09/19/2016).  Scarlette Calico, MD

## 2016-03-22 NOTE — Patient Instructions (Signed)
Type 1 Diabetes Mellitus, Adult Type 1 diabetes mellitus, often simply referred to as diabetes, is a long-term (chronic) disease. It occurs when the islet cells in the pancreas that make insulin (a hormone) are destroyed and can no longer make insulin. Insulin is needed to move sugars from food into the tissue cells. The tissue cells use the sugars for energy. In people with type 1 diabetes, the sugars build up in the blood instead of going into the tissue cells. As a result, high blood sugar (hyperglycemia) develops. Without insulin, the body breaks down fat cells for the needed energy. This breakdown of fat cells produces acid chemicals (ketones), which increases the acid levels in the body. The effect of either high ketone or high sugar (glucose) levels can be life-threatening.  Type 1 diabetes was also previously called juvenile diabetes. It most often occurs before the age of 30, but it can occur at any age. RISK FACTORS A person is predisposed to developing type 1 diabetes if someone in his or her family has the disease and is exposed to certain additional environmental triggers.  SYMPTOMS  Symptoms of type 1 diabetes may develop gradually over days to weeks or suddenly. The symptoms occur due to hyperglycemia. The symptoms can include:   Increased thirst (polydipsia).  Increased urination (polyuria).  Increased urination during the night (nocturia).  Weight loss. This weight loss may be rapid.  Frequent, recurring infections.  Tiredness (fatigue).  Weakness.  Vision changes, such as blurred vision.  Fruity smell to your breath.  Abdominal pain.  Nausea or vomiting.  An open skin wound (ulcer). DIAGNOSIS  Type 1 diabetes is diagnosed when symptoms of diabetes are present and when blood glucose levels are increased. Your blood glucose level may be checked by one or more of the following blood tests:  A fasting blood glucose test. You will not be allowed to eat for at least 8  hours before a blood sample is taken.  A random blood glucose test. Your blood glucose is checked at any time of the day regardless of when you ate.  A hemoglobin A1c blood glucose test. A hemoglobin A1c test provides information about blood glucose control over the previous 3 months. TREATMENT  Although type 1 diabetes cannot be prevented, it can be managed with insulin, diet, and exercise.  You will need to take insulin daily to keep blood glucose in the desired range.  You will need to match insulin dosing with exercise and healthy food choices. Generally, the goal of treatment is to maintain a pre-meal (preprandial) blood glucose level of 80-130 mg/dL. HOME CARE INSTRUCTIONS   Have your hemoglobin A1c level checked twice a year.  Perform daily blood glucose monitoring as directed by your health care provider.  Monitor urine ketones when you are ill and as directed by your health care provider.  Take your insulin as directed by your health care provider to maintain your blood glucose level in the desired range.  Never run out of insulin. It is needed every day.  Adjust insulin based on your intake of carbohydrates. Carbohydrates can raise blood glucose levels but need to be included in your diet. Carbohydrates provide vitamins, minerals, and fiber, which are an essential part of a healthy diet. Carbohydrates are found in fruits, vegetables, whole grains, dairy products, legumes, and foods containing added sugars.  Eat healthy foods. Alternate 3 meals with 3 snacks.  Maintain a healthy weight.  Carry a medical alert card or wear your medical alert   jewelry.  Carry a 15-gram carbohydrate snack with you at all times to treat low blood glucose (hypoglycemia). Some examples of 15-gram carbohydrate snacks include:  Glucose tablets, 3 or 4.  Glucose gel, 15-gram tube.  Raisins, 2 tablespoons (24 grams).  Jelly beans, 6.  Animal crackers, 8.  Fruit juice, regular soda, or  low-fat milk, 4 ounces (120 mL).  Gummy treats, 9.  Recognize hypoglycemia. Hypoglycemia occurs with blood glucose levels of 70 mg/dL and below. The risk for hypoglycemia increases when fasting or skipping meals, during or after intense exercise, and during sleep. Hypoglycemia symptoms can include:  Tremors or shakes.  Decreased ability to concentrate.  Sweating.  Increased heart rate.  Headache.  Dry mouth.  Hunger.  Irritability.  Anxiety.  Restless sleep.  Altered speech or coordination.  Confusion.  Treat hypoglycemia promptly. If you are alert and able to safely swallow, follow the 15:15 rule:  Take 15-20 grams of rapid-acting glucose or carbohydrate. Rapid-acting options include glucose gel, glucose tablets, or 4 ounces (120 mL) of fruit juice, regular soda, or low-fat milk.  Check your blood glucose level 15 minutes after taking the glucose.  Take 15-20 grams more of glucose if the repeat blood glucose level is still 70 mg/dL or below.  Eat a meal or snack within 1 hour once blood glucose levels return to normal.  Be alert to polyuria and polydipsia, which are early signs of hyperglycemia. An early awareness of hyperglycemia allows for prompt treatment. Treat hyperglycemia as directed by your health care provider.  Exercise regularly as directed by your health care provider. This includes:  Stretching and performing strength training exercises, such as yoga or weight lifting, at least 2 times per week.  Performing a total of at least 150 minutes of moderate-intensity exercise each week, such as brisk walking or water aerobics.  Exercising at least 3 days per week, making sure you allow no more than 2 consecutive days to pass without exercising.  Avoiding long periods of inactivity (90 minutes or more). When you have to spend an extended period of time sitting down, take frequent breaks to walk or stretch.  Adjust your insulin dosing and food intake as needed  if you start a new exercise or sport.  Follow your sick-day plan at any time you are unable to eat or drink as usual.   Do not use any tobacco products including cigarettes, chewing tobacco, or electronic cigarettes. If you need help quitting, ask your health care provider.  Limit alcohol intake to no more than 1 drink per day for nonpregnant women and 2 drinks per day for men. You should drink alcohol only when you are also eating food. Talk with your health care provider about whether alcohol is safe for you. Tell your health care provider if you drink alcohol several times a week.  Keep all follow-up visits as directed by your health care provider.  Schedule an eye exam within 5 years of diagnosis and then annually.  Perform daily skin and foot care. Examine your skin and feet daily for cuts, bruises, redness, nail problems, bleeding, blisters, or sores. A foot exam should be done by a health care provider 5 years after diagnosis, and then every year after the first exam.  Brush your teeth and gums at least twice a day and floss at least once a day. Follow up with your dentist regularly.  Share your diabetes management plan with your workplace or school.  Keep your immunizations up to date. It   is recommended that you receive a flu (influenza) vaccine every year. It is also recommended that you receive a pneumonia (pneumococcal) vaccine. If you are 65 years of age or older and have never received a pneumonia vaccine, this vaccine may be given as a series of two separate shots. Ask your health care provider which additional vaccines may be recommended.  Learn to manage stress.  Obtain ongoing diabetes education and support as needed.  Participate in or seek rehabilitation as needed to maintain or improve independence and quality of life. Request a physical or occupational therapy referral if you are having foot or hand numbness, or difficulties with grooming, dressing, eating, or physical  activity. SEEK MEDICAL CARE IF:   You are unable to eat food or drink fluids for more than 6 hours.  You have nausea and vomiting for more than 6 hours.  Your blood glucose level is over 240 mg/dL.  There is a change in mental status.  You develop an additional serious illness.  You have diarrhea for more than 6 hours.  You have been sick or have had a fever for a couple of days and are not getting better.  You have pain during any physical activity. SEEK IMMEDIATE MEDICAL CARE IF:  You have difficulty breathing.  You have moderate to large ketone levels. MAKE SURE YOU:  Understand these instructions.  Will watch your condition.  Will get help right away if you are not doing well or get worse.   This information is not intended to replace advice given to you by your health care provider. Make sure you discuss any questions you have with your health care provider.   Document Released: 07/01/2000 Document Revised: 03/25/2015 Document Reviewed: 01/31/2012 Elsevier Interactive Patient Education 2016 [employer]  

## 2016-03-22 NOTE — Progress Notes (Signed)
Pre visit review using our clinic review tool, if applicable. No additional management support is needed unless otherwise documented below in the visit note. 

## 2016-03-23 ENCOUNTER — Encounter: Payer: Self-pay | Admitting: Internal Medicine

## 2016-03-23 LAB — PSA, TOTAL AND FREE
PSA, Free Pct: 6.8 %
PSA, Free: 0.27 ng/mL
Prostate Specific Ag, Serum: 4 ng/mL (ref 0.0–4.0)

## 2016-04-04 ENCOUNTER — Encounter: Payer: Self-pay | Admitting: Internal Medicine

## 2016-04-04 LAB — URINALYSIS
Bilirubin, UA: NEGATIVE
Glucose, Ur: POSITIVE
Ketones, UA: NEGATIVE
Nitrite, UA: NEGATIVE
Protein, Ur: NEGATIVE
RBC, UA: NONE SEEN
Specific Gravity, UA: 1.025
Urobilinogen, UA: NORMAL
WBC, UA: NONE SEEN
pH, UA: 5.5 (ref 4.5–8.0)

## 2016-04-04 NOTE — Progress Notes (Signed)
Notes received from [facility]

## 2016-04-26 LAB — HM DIABETES FOOT EXAM: HM Diabetic Foot Exam: NORMAL

## 2016-04-26 LAB — HEMOGLOBIN A1C: Hemoglobin A1C: 6.8

## 2016-04-27 ENCOUNTER — Encounter: Payer: Self-pay | Admitting: Internal Medicine

## 2016-05-20 ENCOUNTER — Other Ambulatory Visit: Payer: Self-pay | Admitting: Internal Medicine

## 2016-05-20 DIAGNOSIS — M5030 Other cervical disc degeneration, unspecified cervical region: Secondary | ICD-10-CM

## 2016-07-07 ENCOUNTER — Other Ambulatory Visit: Payer: Self-pay | Admitting: Internal Medicine

## 2016-07-18 HISTORY — PX: COLONOSCOPY: SHX174

## 2016-07-27 LAB — HM COLONOSCOPY

## 2016-07-28 LAB — HM COLONOSCOPY

## 2016-08-08 ENCOUNTER — Encounter: Payer: Self-pay | Admitting: Internal Medicine

## 2016-08-08 NOTE — Progress Notes (Signed)
Pathology report:   Tubular adenoma, hyperplastic polyps.

## 2016-08-09 ENCOUNTER — Other Ambulatory Visit: Payer: Self-pay | Admitting: Internal Medicine

## 2016-08-11 LAB — HM DIABETES EYE EXAM

## 2016-09-20 LAB — PSA: PSA: 3.08

## 2016-09-26 ENCOUNTER — Other Ambulatory Visit: Payer: Self-pay | Admitting: Internal Medicine

## 2016-10-03 ENCOUNTER — Other Ambulatory Visit: Payer: Self-pay | Admitting: Internal Medicine

## 2016-11-03 LAB — BASIC METABOLIC PANEL
BUN: 15 mg/dL (ref 4–21)
Creatinine: 0.9 mg/dL (ref 0.6–1.3)
Glucose: 88 mg/dL
Potassium: 4.2 mmol/L (ref 3.4–5.3)
Sodium: 140 mmol/L (ref 137–147)

## 2016-11-03 LAB — HEPATIC FUNCTION PANEL
ALT: 26 U/L (ref 10–40)
AST: 18 U/L (ref 14–40)
Alkaline Phosphatase: 96 U/L (ref 25–125)
Bilirubin, Total: 0.6 mg/dL

## 2016-11-03 LAB — TSH: TSH: 0.6 u[IU]/mL (ref 0.41–5.90)

## 2016-11-03 LAB — LIPID PANEL
Cholesterol: 122 mg/dL (ref 0–200)
HDL: 37 mg/dL (ref 35–70)
LDL Cholesterol: 68 mg/dL
Triglycerides: 87 mg/dL (ref 40–160)

## 2016-11-03 LAB — HEMOGLOBIN A1C: Hemoglobin A1C: 7.3

## 2016-11-18 ENCOUNTER — Other Ambulatory Visit: Payer: Self-pay | Admitting: Internal Medicine

## 2016-11-18 DIAGNOSIS — M5030 Other cervical disc degeneration, unspecified cervical region: Secondary | ICD-10-CM

## 2016-12-05 LAB — PSA: PSA: 3.38

## 2016-12-19 ENCOUNTER — Encounter: Payer: Self-pay | Admitting: Internal Medicine

## 2016-12-20 ENCOUNTER — Other Ambulatory Visit: Payer: Self-pay | Admitting: Urology

## 2016-12-20 DIAGNOSIS — R972 Elevated prostate specific antigen [PSA]: Secondary | ICD-10-CM

## 2016-12-27 ENCOUNTER — Ambulatory Visit (HOSPITAL_COMMUNITY)
Admission: RE | Admit: 2016-12-27 | Discharge: 2016-12-27 | Disposition: A | Discharge status: Home / Self Care | Payer: BC Managed Care – PPO | Source: Ambulatory Visit | Attending: Urology | Admitting: Urology

## 2016-12-27 DIAGNOSIS — R590 Localized enlarged lymph nodes: Secondary | ICD-10-CM | POA: Diagnosis not present

## 2016-12-27 DIAGNOSIS — R972 Elevated prostate specific antigen [PSA]: Secondary | ICD-10-CM | POA: Diagnosis not present

## 2016-12-27 MED ORDER — LIDOCAINE HCL 2 % EX GEL: 1.0000 "application " | Freq: Once | CUTANEOUS | Status: DC

## 2016-12-27 MED ORDER — GADOBENATE DIMEGLUMINE 529 MG/ML IV SOLN: 20.0000 mL | Freq: Once | INTRAVENOUS | Status: DC | PRN

## 2017-01-04 MED ORDER — GADOBENATE DIMEGLUMINE 529 MG/ML IV SOLN
20.0000 mL | Freq: Once | INTRAVENOUS | Status: AC | PRN
  Administered 2016-12-27: 20 mL via INTRAVENOUS

## 2017-01-08 ENCOUNTER — Other Ambulatory Visit: Payer: Self-pay | Admitting: Internal Medicine

## 2017-01-30 LAB — MICROALBUMIN, URINE: Microalb, Ur: 4.46

## 2017-02-03 ENCOUNTER — Other Ambulatory Visit: Payer: Self-pay | Admitting: Internal Medicine

## 2017-03-13 ENCOUNTER — Other Ambulatory Visit: Payer: Self-pay | Admitting: Internal Medicine

## 2017-05-06 ENCOUNTER — Other Ambulatory Visit: Payer: Self-pay | Admitting: Internal Medicine

## 2017-05-06 DIAGNOSIS — E785 Hyperlipidemia, unspecified: Secondary | ICD-10-CM

## 2017-05-06 MED ORDER — EZETIMIBE 10 MG PO TABS: ORAL_TABLET | ORAL | 1 refills | Status: DC

## 2017-05-18 ENCOUNTER — Other Ambulatory Visit: Payer: Self-pay | Admitting: Internal Medicine

## 2017-05-18 DIAGNOSIS — M5030 Other cervical disc degeneration, unspecified cervical region: Secondary | ICD-10-CM

## 2017-06-12 ENCOUNTER — Other Ambulatory Visit: Payer: Self-pay | Admitting: Internal Medicine

## 2017-08-09 ENCOUNTER — Other Ambulatory Visit: Payer: Self-pay | Admitting: Internal Medicine

## 2017-08-09 DIAGNOSIS — M5030 Other cervical disc degeneration, unspecified cervical region: Secondary | ICD-10-CM

## 2017-08-18 LAB — HM DIABETES EYE EXAM

## 2017-08-23 DIAGNOSIS — E785 Hyperlipidemia, unspecified: Secondary | ICD-10-CM | POA: Diagnosis not present

## 2017-08-23 DIAGNOSIS — N181 Chronic kidney disease, stage 1: Secondary | ICD-10-CM | POA: Diagnosis not present

## 2017-08-23 DIAGNOSIS — E109 Type 1 diabetes mellitus without complications: Secondary | ICD-10-CM | POA: Diagnosis not present

## 2017-08-23 DIAGNOSIS — Z23 Encounter for immunization: Secondary | ICD-10-CM | POA: Diagnosis not present

## 2017-08-23 DIAGNOSIS — Z794 Long term (current) use of insulin: Secondary | ICD-10-CM | POA: Diagnosis not present

## 2017-08-23 DIAGNOSIS — E1022 Type 1 diabetes mellitus with diabetic chronic kidney disease: Secondary | ICD-10-CM | POA: Diagnosis not present

## 2017-08-23 LAB — HEMOGLOBIN A1C: Hemoglobin A1C: 7

## 2017-08-23 LAB — TSH: TSH: 0.79 (ref 0.41–5.90)

## 2017-08-23 LAB — BASIC METABOLIC PANEL
BUN: 15 (ref 4–21)
Creatinine: 1 (ref 0.6–1.3)
Glucose: 169
Potassium: 4.5 (ref 3.4–5.3)
Sodium: 140 (ref 137–147)

## 2017-08-23 LAB — HEPATIC FUNCTION PANEL
ALT: 18 (ref 10–40)
AST: 13 — AB (ref 14–40)
Alkaline Phosphatase: 81 (ref 25–125)
Bilirubin, Total: 0.5

## 2017-08-23 LAB — LIPID PANEL
Cholesterol: 110 (ref 0–200)
HDL: 38 (ref 35–70)
LDL Cholesterol: 58
Triglycerides: 68 (ref 40–160)

## 2017-08-29 DIAGNOSIS — H02839 Dermatochalasis of unspecified eye, unspecified eyelid: Secondary | ICD-10-CM | POA: Diagnosis not present

## 2017-08-29 DIAGNOSIS — H25013 Cortical age-related cataract, bilateral: Secondary | ICD-10-CM | POA: Diagnosis not present

## 2017-08-29 DIAGNOSIS — H2511 Age-related nuclear cataract, right eye: Secondary | ICD-10-CM | POA: Diagnosis not present

## 2017-08-29 DIAGNOSIS — H2513 Age-related nuclear cataract, bilateral: Secondary | ICD-10-CM | POA: Diagnosis not present

## 2017-08-29 DIAGNOSIS — H25043 Posterior subcapsular polar age-related cataract, bilateral: Secondary | ICD-10-CM | POA: Diagnosis not present

## 2017-08-29 DIAGNOSIS — E113393 Type 2 diabetes mellitus with moderate nonproliferative diabetic retinopathy without macular edema, bilateral: Secondary | ICD-10-CM | POA: Diagnosis not present

## 2017-08-31 ENCOUNTER — Encounter: Payer: Self-pay | Admitting: Internal Medicine

## 2017-08-31 NOTE — Progress Notes (Signed)
Result extracted and sent to scan °

## 2017-09-06 LAB — HM DIABETES EYE EXAM

## 2017-09-12 ENCOUNTER — Telehealth: Payer: Self-pay | Admitting: Internal Medicine

## 2017-09-12 NOTE — Telephone Encounter (Signed)
Rec'd from [facility] forwarded 3 pages to Dr. Scarlette Calico

## 2017-09-15 DIAGNOSIS — R972 Elevated prostate specific antigen [PSA]: Secondary | ICD-10-CM | POA: Diagnosis not present

## 2017-09-15 LAB — PSA: PSA: 3.82

## 2017-09-20 DIAGNOSIS — Z8551 Personal history of malignant neoplasm of bladder: Secondary | ICD-10-CM | POA: Diagnosis not present

## 2017-09-20 DIAGNOSIS — N401 Enlarged prostate with lower urinary tract symptoms: Secondary | ICD-10-CM | POA: Diagnosis not present

## 2017-09-20 DIAGNOSIS — R3912 Poor urinary stream: Secondary | ICD-10-CM | POA: Diagnosis not present

## 2017-09-20 DIAGNOSIS — R972 Elevated prostate specific antigen [PSA]: Secondary | ICD-10-CM | POA: Diagnosis not present

## 2017-09-20 DIAGNOSIS — R3121 Asymptomatic microscopic hematuria: Secondary | ICD-10-CM | POA: Diagnosis not present

## 2017-09-20 DIAGNOSIS — N5201 Erectile dysfunction due to arterial insufficiency: Secondary | ICD-10-CM | POA: Diagnosis not present

## 2017-09-22 ENCOUNTER — Encounter: Payer: Medicare Other | Attending: Internal Medicine | Admitting: *Deleted

## 2017-09-22 DIAGNOSIS — Z713 Dietary counseling and surveillance: Secondary | ICD-10-CM | POA: Diagnosis not present

## 2017-09-22 DIAGNOSIS — E109 Type 1 diabetes mellitus without complications: Secondary | ICD-10-CM | POA: Insufficient documentation

## 2017-09-22 DIAGNOSIS — E139 Other specified diabetes mellitus without complications: Secondary | ICD-10-CM

## 2017-09-26 ENCOUNTER — Encounter: Payer: Self-pay | Admitting: Internal Medicine

## 2017-09-26 ENCOUNTER — Ambulatory Visit (INDEPENDENT_AMBULATORY_CARE_PROVIDER_SITE_OTHER): Payer: Medicare Other | Admitting: Internal Medicine

## 2017-09-26 VITALS — BP 138/80 | HR 80 | Temp 97.8°F | Ht 73.0 in | Wt 258.0 lb

## 2017-09-26 DIAGNOSIS — Z0000 Encounter for general adult medical examination without abnormal findings: Secondary | ICD-10-CM

## 2017-09-26 DIAGNOSIS — J069 Acute upper respiratory infection, unspecified: Secondary | ICD-10-CM | POA: Diagnosis not present

## 2017-09-26 DIAGNOSIS — B9789 Other viral agents as the cause of diseases classified elsewhere: Secondary | ICD-10-CM

## 2017-09-26 DIAGNOSIS — J301 Allergic rhinitis due to pollen: Secondary | ICD-10-CM | POA: Diagnosis not present

## 2017-09-26 MED ORDER — PROMETHAZINE-DM 6.25-15 MG/5ML PO SYRP: 5.0000 mL | ORAL_SOLUTION | Freq: Four times a day (QID) | ORAL | 0 refills | Status: DC | PRN

## 2017-09-26 MED ORDER — LEVOCETIRIZINE DIHYDROCHLORIDE 5 MG PO TABS: 5.0000 mg | ORAL_TABLET | Freq: Every evening | ORAL | 1 refills | Status: DC

## 2017-09-26 MED ORDER — METHYLPREDNISOLONE 4 MG PO TBPK: ORAL_TABLET | ORAL | 0 refills | Status: DC

## 2017-09-26 NOTE — Progress Notes (Signed)
Pump Follow Up Training:Date: 09/22/2017   Appt start time: 0800  End time: 0900.   Assessment:  Patient here for follow up after resuming Tandem insulin pump and Dexcom CGM. Of note, the patient attended the Type 1 / Pump Support Group on the previous Wednesday night. The speaker was the rep from the [employer] and he states he learned a lot from that presentation, especially about his insurance coverage and Medicare coverage. He states he took a break from pump therapy for about 6 months but is ready to resume. He has the pump on now. .   Medications: see list. Insulin used in pump is Novolog  Intervention:  Pump settings reviewed with patient including definitions of Insulin Carb Ratio, Insulin Sensitivity Factor, Target, Active Insulin and it's relationship to correcting a BG before 4 hours of insulin activity is up.  Reviewed Temp Basal feature, how to set it up and when he might use it. Patient expressed understanding with return demonstration  Patient expressed understanding of info taught and is wearing pump/CGM by end of visit.  Follow Up  Patient to see MD for insulin dose adjustments and to see me PRN

## 2017-09-26 NOTE — Patient Instructions (Signed)

## 2017-09-26 NOTE — Progress Notes (Signed)
Subjective:  Patient ID: Anthony Seashore., male    DOB: [birth year]  Age: 67 y.o. MRN: [id number]  CC: Annual Exam and URI   HPI Anthony Daniel. presents for a CPX.  He complains of a 5-day history of cough that is productive of clear phlegm and nasal congestion.  The nasal congestion keeps him awake at night.  He denies shortness of breath, fever, chills, night sweats, hemoptysis, or wheezing.  He iha tried Mucinex without much relief from his symptoms.  He recently saw his urologist and was told that his prostate exam was okay and his PSA was down to 3.82. He also recently saw his endocrinologist and his A1c was down to 7.0%.  Past Medical History:  Diagnosis Date  . History of bladder cancer    1998--  TCC  . History of kidney stones   . Hyperlipidemia   . Hypertension   . LADA (latent autoimmune diabetes in adults), managed as type 1 ([place])   . Nephrolithiasis    bilateral --  nonobstructive per CT   Past Surgical History:  Procedure Laterality Date  . CYSTOSCOPY W/ URETERAL STENT PLACEMENT Right 05/12/2015   Procedure: CYSTOSCOPY WITH STENT REPLACEMENT;  Surgeon: Cleon Gustin, MD;  Location: [facility], [place];  Service: Urology;  Laterality: Right;  . CYSTOSCOPY WITH URETEROSCOPY AND STENT PLACEMENT Right 04/23/2015   Procedure: CYSTOSCOPY WITH URETEROSCOPY RETROGRADE PYELOGRAM AND STENT PLACEMENT;  Surgeon: Cleon Gustin, MD;  Location: [facility];  Service: Urology;  Laterality: Right;  . CYSTOSCOPY/RETROGRADE/URETEROSCOPY/STONE EXTRACTION WITH BASKET Right 05/12/2015   Procedure: CYSTOSCOPY/RETROGRADE/URETEROSCOPY/STONE EXTRACTION WITH BASKET;  Surgeon: Cleon Gustin, MD;  Location: [facility];  Service: Urology;  Laterality: Right;  . EXTRACORPOREAL SHOCK WAVE LITHOTRIPSY  x2 in  2006//   x2  in 2012  . HOLMIUM LASER APPLICATION Right 98/92/1194   Procedure: HOLMIUM LASER APPLICATION;  Surgeon: Cleon Gustin, MD;  Location: [facility];  Service: Urology;  Laterality: Right;  . LAPAROSCOPIC APPENDECTOMY N/A 11/07/2012   Procedure: APPENDECTOMY LAPAROSCOPIC;  Surgeon: Harl Bowie, MD;  Location: [place];  Service: General;  Laterality: N/A;  . NEPHROLITHOTOMY Left 08/06/2015   Procedure: 1ST STAGE  LEFT PERCUTANEOUS NEPHROLITHOTOMY ;  Surgeon: Irine Seal, MD;  Location: [facility];  Service: Urology;  Laterality: Left;  . TONSILLECTOMY  1975  . TRANSURETHRAL RESECTION OF BLADDER TUMOR  1998    reports that he has been smoking cigarettes.  He has a 30.00 pack-year smoking history. he has never used smokeless tobacco. He reports that he drinks about 3.0 oz of alcohol per week. He reports that he does not use drugs. family history includes Cancer in his other; Diabetes in his other; Hypertension in his other. Allergies  Allergen Reactions  . Crestor [Rosuvastatin] Other (See Comments)    Muscle aches, cramps  . Lipitor [Atorvastatin] Other (See Comments)    Muscle aches, cramps  . Contrast Media [Iodinated Diagnostic Agents] Itching and Nausea And Vomiting    PT TRIED THIS TWICE.EVEN WITH PRE-MEDS AND STILL HAD NAUSEA AND ITCHING--WAS TOLD HE CAN NOT TAKE iv ANYMORE  . Latex Other (See Comments)    Irritation  . Oxycodone Nausea Only    Pt can tolerate hydrocodone   . Adhesive [Tape] Rash  . Peanut-Containing Drug Products Anxiety and Other (See Comments)    Jittery    Outpatient Medications Prior to Visit  Medication Sig Dispense Refill  . aspirin EC 81 MG tablet  Take 81 mg by mouth daily.    . Cholecalciferol (VITAMIN D) 2000 units tablet Take 2,000 Units by mouth daily.    . Ciclopirox 0.77 % gel APPLY TWICE A DAY TO AFFECTED AREA. 30 g 3  . Cyanocobalamin 3000 MCG CAPS Take 3,000 mcg by mouth daily.    . diclofenac sodium (VOLTAREN) 1 % GEL Apply 4 g topically 4 (four) times daily. 1 Tube 3  . ezetimibe (ZETIA) 10 MG tablet TAKE 1 TABLET (10 MG TOTAL) BY MOUTH DAILY. 90 tablet 1  . FARXIGA 10  MG TABS tablet TAKE 10 MG BY MOUTH DAILY. 90 tablet 1  . fluticasone (FLONASE) 50 MCG/ACT nasal spray Place 2 sprays into both nostrils daily. 16 g 2  . Insulin Glargine (LANTUS SOLOSTAR [place]) Inject 50 Units into the skin 2 (two) times daily as needed. Only uses when pump goes out    . Insulin Human (INSULIN PUMP) SOLN Inject 1 each into the skin continuous. Novolog insulin    . meloxicam (MOBIC) 15 MG tablet TAKE 1 TABLET (15 MG TOTAL) BY MOUTH DAILY. 90 tablet 0  . NOVOLOG 100 UNIT/ML injection     . Pitavastatin Calcium (LIVALO) 2 MG TABS Take 2 mg by mouth daily.     Vladimir Faster Glycol-Propyl Glycol (SYSTANE OP) Place 1 drop into both eyes every 8 (eight) hours as needed (dry eyes).    . Potassium Citrate 15 MEQ (1620 MG) TBCR Take 1 tablet by mouth 2 (two) times daily.    . ramipril (ALTACE) 10 MG tablet Take 10 mg by mouth daily before breakfast.     . sildenafil (REVATIO) 20 MG tablet Take 20 mg by mouth daily as needed (erectile dysfunction).    . tamsulosin (FLOMAX) 0.4 MG CAPS capsule Take 1 capsule (0.4 mg total) by mouth daily. 30 capsule 0   No facility-administered medications prior to visit.     ROS Review of Systems  Constitutional: Negative for chills, diaphoresis, fatigue and fever.  HENT: Positive for congestion, postnasal drip and rhinorrhea. Negative for facial swelling, sinus pressure, sore throat, trouble swallowing and voice change.   Eyes: Negative.   Respiratory: Negative for cough, chest tightness, shortness of breath and wheezing.   Cardiovascular: Negative for chest pain, palpitations and leg swelling.  Gastrointestinal: Negative for abdominal pain, constipation, diarrhea, nausea and vomiting.  Endocrine: Negative for polydipsia, polyphagia and polyuria.  Genitourinary: Negative.  Negative for difficulty urinating, penile swelling, scrotal swelling and testicular pain.  Musculoskeletal: Negative.  Negative for arthralgias and myalgias.  Skin: Negative.  Negative  for color change, pallor and rash.  Allergic/Immunologic: Negative.   Neurological: Negative.  Negative for dizziness.  Hematological: Negative for adenopathy. Does not bruise/bleed easily.  Psychiatric/Behavioral: Negative.     Objective:  BP 138/80 (BP Location: Left Arm, Patient Position: Sitting, Cuff Size: Large)   Pulse 80   Temp 97.8 F (36.6 C) (Oral)   Ht 6\' 1"  (1.854 m)   Wt 258 lb (117 kg)   SpO2 99%   BMI 34.04 kg/m   BP Readings from Last 3 Encounters:  09/26/17 138/80  03/22/16 130/80  12/17/15 108/70    Wt Readings from Last 3 Encounters:  09/26/17 258 lb (117 kg)  03/22/16 241 lb 4 oz (109.4 kg)  12/17/15 229 lb (103.9 kg)    Physical Exam  Constitutional: He is oriented to person, place, and time. No distress.  HENT:  Nose: Mucosal edema present. No rhinorrhea or sinus tenderness. Right sinus  exhibits no maxillary sinus tenderness and no frontal sinus tenderness. Left sinus exhibits no maxillary sinus tenderness and no frontal sinus tenderness.  Mouth/Throat: Oropharynx is clear and moist and mucous membranes are normal. Mucous membranes are not pale, not dry and not cyanotic. No oropharyngeal exudate, posterior oropharyngeal edema, posterior oropharyngeal erythema or tonsillar abscesses.  Eyes: Conjunctivae are normal. Left eye exhibits no discharge. No scleral icterus.  Neck: Normal range of motion. Neck supple. No JVD present. No thyromegaly present.  Cardiovascular: Normal rate, regular rhythm and normal heart sounds. Exam reveals no gallop.  No murmur heard. Pulmonary/Chest: Effort normal and breath sounds normal. No respiratory distress. He has no wheezes. He has no rales.  Abdominal: Soft. Bowel sounds are normal. He exhibits no distension and no mass. There is no tenderness. There is no guarding.  Genitourinary:  Genitourinary Comments: GU and rectal exams were deferred at his request since he recently saw his urologist and these exams were  performed.  Musculoskeletal: Normal range of motion. He exhibits no edema or tenderness.  Lymphadenopathy:    He has no cervical adenopathy.  Neurological: He is alert and oriented to person, place, and time.  Skin: Skin is warm and dry. No rash noted. He is not diaphoretic. No erythema. No pallor.  Psychiatric: He has a normal mood and affect. His behavior is normal. Judgment and thought content normal.  Vitals reviewed.   Lab Results  Component Value Date   WBC 13.5 (H) 12/17/2015   HGB 16.7 12/17/2015   HCT 50.4 12/17/2015   PLT 179.0 12/17/2015   GLUCOSE 131 (H) 03/22/2016   CHOL 110 08/23/2017   TRIG 68 08/23/2017   HDL 38 08/23/2017   LDLCALC 58 08/23/2017   ALT 18 08/23/2017   AST 13 (A) 08/23/2017   NA 140 08/23/2017   K 4.5 08/23/2017   CL 105 03/22/2016   CREATININE 1.0 08/23/2017   BUN 15 08/23/2017   CO2 31 03/22/2016   TSH 0.79 08/23/2017   PSA 3.82 09/15/2017   INR 1.02 08/06/2015   HGBA1C 7.0 08/23/2017   MICROALBUR 4.46 01/30/2017     Visual Acuity Screening   Right eye Left eye Both eyes  Without correction: 20/50 20/25 20/20   With correction:     Hearing Screening Comments: Passed whisper test   Mr Prostate W Wo Contrast  Result Date: 12/27/2016 CLINICAL DATA:  Elevated PSA. EXAM: MR PROSTATE WITHOUT AND WITH CONTRAST TECHNIQUE: Multiplanar multisequence MRI images were obtained of the pelvis centered about the prostate. Pre and post contrast images were obtained. CONTRAST:  20 cc MultiHance COMPARISON:  [place] study 08/08/2015.  Biopsy results of 04/23/2014. FINDINGS: Prostate: no areas of masslike T2 hypointensity, restricted diffusion, or early post-contrast enhancement within the peripheral zone. No dominant central gland lesion. Volume: 2.9 x 5.3 x 3.8 cm (volume = 31 cm^3) Transcapsular spread:  Absent Seminal vesicle involvement: Absent Neurovascular bundle involvement: Absent Pelvic adenopathy: Left external iliac node measures 12 mm on image  26/series 3 but is present and similar in size back to 08/25/2012. A right external iliac node measures 9 mm but is also chronic. Bone metastasis: Absent Other findings: No significant free fluid.  Normal urinary bladder. IMPRESSION: 1. No evidence of macroscopic or high-grade prostate carcinoma. 2. Mild pelvic sidewall adenopathy has been present back to 2014, most consistent with a benign etiology. Electronically Signed   By: Abigail Miyamoto M.D.   On: 12/27/2016 14:57    Assessment & Plan:   Marquis was  seen today for annual exam and uri.  Diagnoses and all orders for this visit:  Viral URI with cough- His symptoms and exam are consistent with a viral URI.  Will offer symptom relief with Phenergan DM as needed. -     promethazine-dextromethorphan (PROMETHAZINE-DM) 6.25-15 MG/5ML syrup; Take 5 mLs by mouth 4 (four) times daily as needed for cough.  Routine general medical examination at a health care facility  Seasonal allergic rhinitis due to pollen- He is having a severe flare of his symptoms.  I will treat with a course of systemic steroids and an oral antihistamine. -     levocetirizine (XYZAL) 5 MG tablet; Take 1 tablet (5 mg total) by mouth every evening. -     methylPREDNISolone (MEDROL DOSEPAK) 4 MG TBPK tablet; TAKE AS DIRECTED   I am having Anthony Seashore. start on levocetirizine, methylPREDNISolone, and promethazine-dextromethorphan. I am also having him maintain his ramipril, Insulin Glargine (LANTUS SOLOSTAR [place]), Potassium Citrate, aspirin EC, Pitavastatin Calcium, tamsulosin, Cyanocobalamin, Polyethyl Glycol-Propyl Glycol (SYSTANE OP), insulin pump, sildenafil, Vitamin D, fluticasone, diclofenac sodium, NOVOLOG, FARXIGA, ezetimibe, Ciclopirox, and meloxicam.  Meds ordered this encounter  Medications  . levocetirizine (XYZAL) 5 MG tablet    Sig: Take 1 tablet (5 mg total) by mouth every evening.    Dispense:  90 tablet    Refill:  1  . methylPREDNISolone (MEDROL DOSEPAK) 4 MG  TBPK tablet    Sig: TAKE AS DIRECTED    Dispense:  21 tablet    Refill:  0  . promethazine-dextromethorphan (PROMETHAZINE-DM) 6.25-15 MG/5ML syrup    Sig: Take 5 mLs by mouth 4 (four) times daily as needed for cough.    Dispense:  118 mL    Refill:  0   See AVS for instructions about healthy living and anticipatory guidance.  Follow-up: Return if symptoms worsen or fail to improve.  Scarlette Calico, MD

## 2017-09-26 NOTE — Assessment & Plan Note (Signed)
Advanced care planning and end-of-life decisions were discussed during this visit.  He has decided not to make any decisions about this at this time.  The written screening recommendations is given to patient and attached in the patent instructions or AVS.   The patient is here for annual Medicare wellness examination and management of other chronic and acute problems.   The risk factors are reflected in the social history.  The roster of all physicians providing medical care to patient - is listed in the Snapshot section of the chart.  Activities of daily living:  The patient is 100% inedpendent in all ADLs: dressing, toileting, feeding as well as independent mobility  Home safety : The patient has smoke detectors in the home. They wear seatbelts.No firearms at home ( firearms are present in the home, kept in a safe fashion). There is no violence in the home.   There is no risks for hepatitis, STDs or HIV. There is no   history of blood transfusion. They have no travel history to infectious disease endemic areas of the world.  The patient has (has not) seen their dentist in the last six month. They have (not) seen their eye doctor in the last year. They deny (admit to) any hearing difficulty and have not had audiologic testing in the last year.  They do not  have excessive sun exposure. Discussed the need for sun protection: hats, long sleeves and use of sunscreen if there is significant sun exposure.   Diet: the importance of a healthy diet is discussed. They do have a healthy (unhealthy-high fat/fast food) diet.  The patient has a regular exercise program.  The benefits of regular aerobic exercise were discussed.  Depression screen: there are no signs or vegative symptoms of depression- irritability, change in appetite, anhedonia, sadness/tearfullness.  Cognitive assessment: the patient manages all their financial and personal affairs and is actively engaged. They could relate  day,date,year and events; recalled 3/3 objects at 3 minutes; performed clock-face test normally.  The following portions of the patient's history were reviewed and updated as appropriate: allergies, current medications, past family history, past medical history,  past surgical history, past social history  and problem list.  Vision, hearing, body mass index were assessed and reviewed.   During the course of the visit the patient was educated and counseled about appropriate screening and preventive services including : fall prevention , diabetes screening, nutrition counseling, colorectal cancer screening, and recommended immunizations.

## 2017-09-28 ENCOUNTER — Other Ambulatory Visit: Payer: Self-pay | Admitting: Internal Medicine

## 2017-11-06 ENCOUNTER — Other Ambulatory Visit: Payer: Self-pay | Admitting: Internal Medicine

## 2017-11-06 DIAGNOSIS — E785 Hyperlipidemia, unspecified: Secondary | ICD-10-CM

## 2017-11-20 ENCOUNTER — Other Ambulatory Visit: Payer: Self-pay | Admitting: Internal Medicine

## 2017-11-20 DIAGNOSIS — M5030 Other cervical disc degeneration, unspecified cervical region: Secondary | ICD-10-CM

## 2017-11-24 LAB — HEMOGLOBIN A1C: Hemoglobin A1C: 7.1

## 2017-12-01 DIAGNOSIS — Z794 Long term (current) use of insulin: Secondary | ICD-10-CM | POA: Diagnosis not present

## 2017-12-01 DIAGNOSIS — E109 Type 1 diabetes mellitus without complications: Secondary | ICD-10-CM | POA: Diagnosis not present

## 2017-12-01 DIAGNOSIS — E785 Hyperlipidemia, unspecified: Secondary | ICD-10-CM | POA: Diagnosis not present

## 2017-12-01 DIAGNOSIS — N181 Chronic kidney disease, stage 1: Secondary | ICD-10-CM | POA: Diagnosis not present

## 2017-12-01 DIAGNOSIS — E1022 Type 1 diabetes mellitus with diabetic chronic kidney disease: Secondary | ICD-10-CM | POA: Diagnosis not present

## 2017-12-21 DIAGNOSIS — R972 Elevated prostate specific antigen [PSA]: Secondary | ICD-10-CM | POA: Diagnosis not present

## 2017-12-21 LAB — PSA: PSA: 3.63

## 2017-12-27 DIAGNOSIS — N200 Calculus of kidney: Secondary | ICD-10-CM | POA: Diagnosis not present

## 2017-12-27 DIAGNOSIS — N401 Enlarged prostate with lower urinary tract symptoms: Secondary | ICD-10-CM | POA: Diagnosis not present

## 2017-12-27 DIAGNOSIS — R972 Elevated prostate specific antigen [PSA]: Secondary | ICD-10-CM | POA: Diagnosis not present

## 2017-12-27 DIAGNOSIS — Z8551 Personal history of malignant neoplasm of bladder: Secondary | ICD-10-CM | POA: Diagnosis not present

## 2017-12-27 DIAGNOSIS — R3121 Asymptomatic microscopic hematuria: Secondary | ICD-10-CM | POA: Diagnosis not present

## 2017-12-27 DIAGNOSIS — R3912 Poor urinary stream: Secondary | ICD-10-CM | POA: Diagnosis not present

## 2018-01-01 ENCOUNTER — Emergency Department (HOSPITAL_COMMUNITY): Payer: Medicare Other

## 2018-01-01 ENCOUNTER — Emergency Department (HOSPITAL_COMMUNITY)
Admission: EM | Admit: 2018-01-01 | Discharge: 2018-01-01 | Disposition: A | Discharge status: Home / Self Care | Payer: Medicare Other | Attending: Emergency Medicine | Admitting: Emergency Medicine

## 2018-01-01 ENCOUNTER — Encounter (HOSPITAL_COMMUNITY): Payer: Self-pay | Admitting: *Deleted

## 2018-01-01 DIAGNOSIS — N23 Unspecified renal colic: Secondary | ICD-10-CM | POA: Diagnosis not present

## 2018-01-01 DIAGNOSIS — Z79899 Other long term (current) drug therapy: Secondary | ICD-10-CM | POA: Insufficient documentation

## 2018-01-01 DIAGNOSIS — Z91010 Allergy to peanuts: Secondary | ICD-10-CM | POA: Insufficient documentation

## 2018-01-01 DIAGNOSIS — F17210 Nicotine dependence, cigarettes, uncomplicated: Secondary | ICD-10-CM | POA: Insufficient documentation

## 2018-01-01 DIAGNOSIS — N132 Hydronephrosis with renal and ureteral calculous obstruction: Secondary | ICD-10-CM | POA: Diagnosis not present

## 2018-01-01 DIAGNOSIS — Z91040 Latex allergy status: Secondary | ICD-10-CM | POA: Diagnosis not present

## 2018-01-01 DIAGNOSIS — I10 Essential (primary) hypertension: Secondary | ICD-10-CM | POA: Insufficient documentation

## 2018-01-01 DIAGNOSIS — Z7982 Long term (current) use of aspirin: Secondary | ICD-10-CM | POA: Diagnosis not present

## 2018-01-01 DIAGNOSIS — R109 Unspecified abdominal pain: Secondary | ICD-10-CM | POA: Diagnosis present

## 2018-01-01 LAB — CBC
HCT: 48 % (ref 39.0–52.0)
Hemoglobin: 15.5 g/dL (ref 13.0–17.0)
MCH: 29.2 pg (ref 26.0–34.0)
MCHC: 32.3 g/dL (ref 30.0–36.0)
MCV: 90.6 fL (ref 78.0–100.0)
Platelets: 191 10*3/uL (ref 150–400)
RBC: 5.3 MIL/uL (ref 4.22–5.81)
RDW: 14.9 % (ref 11.5–15.5)
WBC: 17.5 10*3/uL — ABNORMAL HIGH (ref 4.0–10.5)

## 2018-01-01 LAB — CBG MONITORING, ED: Glucose-Capillary: 76 mg/dL (ref 65–99)

## 2018-01-01 LAB — URINALYSIS, ROUTINE W REFLEX MICROSCOPIC
Bacteria, UA: NONE SEEN
Bilirubin Urine: NEGATIVE
Glucose, UA: NEGATIVE mg/dL
Ketones, ur: 5 mg/dL — AB
Leukocytes, UA: NEGATIVE
Nitrite: NEGATIVE
Protein, ur: 30 mg/dL — AB
Specific Gravity, Urine: 1.02 (ref 1.005–1.030)
pH: 5 (ref 5.0–8.0)

## 2018-01-01 LAB — BASIC METABOLIC PANEL
Anion gap: 11 (ref 5–15)
BUN: 15 mg/dL (ref 6–20)
CO2: 19 mmol/L — ABNORMAL LOW (ref 22–32)
Calcium: 8.7 mg/dL — ABNORMAL LOW (ref 8.9–10.3)
Chloride: 106 mmol/L (ref 101–111)
Creatinine, Ser: 1.41 mg/dL — ABNORMAL HIGH (ref 0.61–1.24)
GFR calc Af Amer: 59 mL/min — ABNORMAL LOW (ref >60)
GFR calc non Af Amer: 51 mL/min — ABNORMAL LOW (ref >60)
Glucose, Bld: 151 mg/dL — ABNORMAL HIGH (ref 65–99)
Potassium: 5.4 mmol/L — ABNORMAL HIGH (ref 3.5–5.1)
Sodium: 136 mmol/L (ref 135–145)

## 2018-01-01 MED ORDER — HYDROMORPHONE HCL 2 MG/ML IJ SOLN
1.0000 mg | Freq: Once | INTRAMUSCULAR | Status: AC
  Administered 2018-01-01: 1 mg via INTRAMUSCULAR
  Filled 2018-01-01: qty 1

## 2018-01-01 MED ORDER — HYDROMORPHONE HCL 2 MG PO TABS: 1.0000 mg | ORAL_TABLET | Freq: Four times a day (QID) | ORAL | 0 refills | Status: DC | PRN

## 2018-01-01 MED ORDER — ONDANSETRON 4 MG PO TBDP
4.0000 mg | ORAL_TABLET | Freq: Once | ORAL | Status: AC
  Administered 2018-01-01: 4 mg via ORAL
  Filled 2018-01-01: qty 1

## 2018-01-01 MED ORDER — ONDANSETRON HCL 4 MG PO TABS: 4.0000 mg | ORAL_TABLET | Freq: Four times a day (QID) | ORAL | 0 refills | Status: DC

## 2018-01-01 MED ORDER — KETOROLAC TROMETHAMINE 15 MG/ML IJ SOLN
15.0000 mg | Freq: Once | INTRAMUSCULAR | Status: AC
  Administered 2018-01-01: 15 mg via INTRAVENOUS
  Filled 2018-01-01: qty 1

## 2018-01-01 MED ORDER — SODIUM CHLORIDE 0.9 % IV BOLUS
500.0000 mL | Freq: Once | INTRAVENOUS | Status: AC
  Administered 2018-01-01: 500 mL via INTRAVENOUS

## 2018-01-01 NOTE — ED Notes (Signed)
Pt given urine specimen cup in lobby. 

## 2018-01-01 NOTE — ED Triage Notes (Signed)
Pt in c/o left flank pain, history of kidney stones and states this feels the same, reports n/v as well, no distress at this time

## 2018-01-01 NOTE — ED Notes (Signed)
Called lab to inquire about UA results. Informed they are processing it next

## 2018-01-01 NOTE — Discharge Instructions (Signed)
Please return for any problem.  Follow-up with Dr. Jeffie Pollock tomorrow as instructed.

## 2018-01-01 NOTE — ED Provider Notes (Signed)
[facility] EMERGENCY DEPARTMENT Provider Note   CSN: [id number] Arrival date & time: 01/01/18  1348     History   Chief Complaint Chief Complaint  Patient presents with  . Flank Pain    HPI Anthony Daniel. is a 67 y.o. male.  67 year old male with prior history of renal colic presents with complaint of left-sided flank pain.  Patient reports pain started this morning around 04 100.  He reports associated nausea and vomiting.  He denies fever.  Pain is consistent with prior episodes of renal colic.  The history is provided by the patient.  Flank Pain  This is a new problem. The current episode started 6 to 12 hours ago. The problem occurs constantly. The problem has not changed since onset.Pertinent negatives include no chest pain and no abdominal pain. Nothing aggravates the symptoms. Nothing relieves the symptoms. He has tried nothing for the symptoms.    Past Medical History:  Diagnosis Date  . History of bladder cancer    1998--  TCC  . History of kidney stones   . Hyperlipidemia   . Hypertension   . LADA (latent autoimmune diabetes in adults), managed as type 1 ([place])   . Nephrolithiasis    bilateral --  nonobstructive per CT    Patient Active Problem List   Diagnosis Date Noted  . Viral URI with cough 09/26/2017  . Seasonal allergic rhinitis due to pollen 09/26/2017  . Neck pain on left side 12/17/2015  . Screen for colon cancer 12/17/2015  . Left cervical radiculopathy 12/01/2015  . Staghorn renal calculus   . Sleep apnea in adult 06/24/2015  . Tinea pedis, recurrent 12/10/2014  . Dyshidrotic foot dermatitis 12/10/2014  . DDD (degenerative disc disease), cervical 11/10/2014  . PSA elevation 03/07/2014  . Statin intolerance 11/05/2013  . Background diabetic retinopathy ([place]) 08/05/2013  . Hyperlipidemia with target LDL less than 100 11/05/2012  . Mucocele of appendix 08/31/2012  . Routine general medical examination at a health care  facility 06/26/2012  . LADA (latent autoimmune diabetes in adults), managed as type 1 ([place]) 06/26/2012    Past Surgical History:  Procedure Laterality Date  . CYSTOSCOPY W/ URETERAL STENT PLACEMENT Right 05/12/2015   Procedure: CYSTOSCOPY WITH STENT REPLACEMENT;  Surgeon: Cleon Gustin, MD;  Location: [facility];  Service: Urology;  Laterality: Right;  . CYSTOSCOPY WITH URETEROSCOPY AND STENT PLACEMENT Right 04/23/2015   Procedure: CYSTOSCOPY WITH URETEROSCOPY RETROGRADE PYELOGRAM AND STENT PLACEMENT;  Surgeon: Cleon Gustin, MD;  Location: [facility];  Service: Urology;  Laterality: Right;  . CYSTOSCOPY/RETROGRADE/URETEROSCOPY/STONE EXTRACTION WITH BASKET Right 05/12/2015   Procedure: CYSTOSCOPY/RETROGRADE/URETEROSCOPY/STONE EXTRACTION WITH BASKET;  Surgeon: Cleon Gustin, MD;  Location: [facility];  Service: Urology;  Laterality: Right;  . EXTRACORPOREAL SHOCK WAVE LITHOTRIPSY  x2 in  2006//   x2  in 2012  . HOLMIUM LASER APPLICATION Right 08/67/6195   Procedure: HOLMIUM LASER APPLICATION;  Surgeon: Cleon Gustin, MD;  Location: [facility];  Service: Urology;  Laterality: Right;  . LAPAROSCOPIC APPENDECTOMY N/A 11/07/2012   Procedure: APPENDECTOMY LAPAROSCOPIC;  Surgeon: Harl Bowie, MD;  Location: [place];  Service: General;  Laterality: N/A;  . NEPHROLITHOTOMY Left 08/06/2015   Procedure: 1ST STAGE  LEFT PERCUTANEOUS NEPHROLITHOTOMY ;  Surgeon: Irine Seal, MD;  Location: [facility];  Service: Urology;  Laterality: Left;  . TONSILLECTOMY  1975  . [place] TUMOR  1998  Home Medications    Prior to Admission medications   Medication Sig Start Date End Date Taking? Authorizing Provider  aspirin EC 81 MG tablet Take 81 mg by mouth daily.    [provider]  Cholecalciferol (VITAMIN D) 2000 units tablet Take 2,000 Units by mouth daily.    [provider]  Ciclopirox 0.77 % gel  APPLY TWICE A DAY TO AFFECTED AREA. 09/28/17   Janith Lima, MD  Cyanocobalamin 3000 MCG CAPS Take 3,000 mcg by mouth daily.    [provider]  diclofenac sodium (VOLTAREN) 1 % GEL Apply 4 g topically 4 (four) times daily. 11/16/15   Burnard Hawthorne, FNP  ezetimibe (ZETIA) 10 MG tablet TAKE 1 TABLET BY MOUTH EVERY DAY 11/06/17   Janith Lima, MD  FARXIGA 10 MG TABS tablet TAKE 10 MG BY MOUTH DAILY. 03/13/17   Janith Lima, MD  fluticasone (FLONASE) 50 MCG/ACT nasal spray Place 2 sprays into both nostrils daily. 07/17/15   Kuneff, Renee A, DO  HYDROmorphone (DILAUDID) 2 MG tablet Take 0.5 tablets (1 mg total) by mouth every 6 (six) hours as needed for severe pain. 01/01/18   Valarie Merino, MD  Insulin Glargine (LANTUS SOLOSTAR [place]) Inject 50 Units into the skin 2 (two) times daily as needed. Only uses when pump goes out    [provider]  Insulin Human (INSULIN PUMP) SOLN Inject 1 each into the skin continuous. Novolog insulin    [provider]  levocetirizine (XYZAL) 5 MG tablet Take 1 tablet (5 mg total) by mouth every evening. 09/26/17   Janith Lima, MD  meloxicam (MOBIC) 15 MG tablet TAKE 1 TABLET (15 MG TOTAL) BY MOUTH DAILY. 11/20/17   Janith Lima, MD  methylPREDNISolone (MEDROL DOSEPAK) 4 MG TBPK tablet TAKE AS DIRECTED 09/26/17   Janith Lima, MD  NOVOLOG 100 UNIT/ML injection  12/15/15   [provider]  ondansetron (ZOFRAN) 4 MG tablet Take 1 tablet (4 mg total) by mouth every 6 (six) hours. 01/01/18   Valarie Merino, MD  Pitavastatin Calcium (LIVALO) 2 MG TABS Take 2 mg by mouth daily.     [provider]  Polyethyl Glycol-Propyl Glycol (SYSTANE OP) Place 1 drop into both eyes every 8 (eight) hours as needed (dry eyes).    [provider]  Potassium Citrate 15 MEQ (1620 MG) TBCR Take 1 tablet by mouth 2 (two) times daily.    [provider]  promethazine-dextromethorphan (PROMETHAZINE-DM) 6.25-15 MG/5ML syrup  Take 5 mLs by mouth 4 (four) times daily as needed for cough. 09/26/17   Janith Lima, MD  ramipril (ALTACE) 10 MG tablet Take 10 mg by mouth daily before breakfast.     [provider]  sildenafil (REVATIO) 20 MG tablet Take 20 mg by mouth daily as needed (erectile dysfunction).    [provider]  tamsulosin (FLOMAX) 0.4 MG CAPS capsule Take 1 capsule (0.4 mg total) by mouth daily. 04/22/15   Carmin Muskrat, MD    Family History Family History  Problem Relation Age of Onset  . Cancer Other        Colon and Prostate Cancer  . Hypertension Other   . Diabetes Other   . Alcohol abuse Neg Hx   . Drug abuse Neg Hx   . Early death Neg Hx   . Heart disease Neg Hx   . Hyperlipidemia Neg Hx   . Kidney disease Neg Hx   . Stroke  Neg Hx     Social History Social History   Tobacco Use  . Smoking status: Current Every Day Smoker    Packs/day: 1.00    Years: 30.00    Pack years: 30.00    Types: Cigarettes  . Smokeless tobacco: Never Used  Substance Use Topics  . Alcohol use: Yes    Alcohol/week: 3.0 oz    Types: 5 Shots of liquor per week    Comment: occasional  . Drug use: No     Allergies   Crestor [rosuvastatin]; Lipitor [atorvastatin]; Contrast media [iodinated diagnostic agents]; Latex; Oxycodone; Adhesive [tape]; and Peanut-containing drug products   Review of Systems Review of Systems  Cardiovascular: Negative for chest pain.  Gastrointestinal: Negative for abdominal pain.  Genitourinary: Positive for flank pain.  All other systems reviewed and are negative.    Physical Exam Updated Vital Signs BP 140/66   Pulse 95   Temp 98.3 F (36.8 C) (Oral)   Resp 16   SpO2 94%   Physical Exam  Constitutional: He is oriented to person, place, and time. He appears well-developed and well-nourished. No distress.  HENT:  Head: Normocephalic and atraumatic.  Mouth/Throat: Oropharynx is clear and moist.  Eyes: Pupils are equal, round, and reactive to  light. Conjunctivae and EOM are normal.  Neck: Normal range of motion. Neck supple.  Cardiovascular: Normal rate, regular rhythm and normal heart sounds.  Pulmonary/Chest: Effort normal and breath sounds normal. No respiratory distress.  Abdominal: Soft. He exhibits no distension. There is no tenderness.  Musculoskeletal: Normal range of motion. He exhibits no edema or deformity.  Neurological: He is alert and oriented to person, place, and time.  Skin: Skin is warm and dry.  Psychiatric: He has a normal mood and affect.  Nursing note and vitals reviewed.    ED Treatments / Results  Labs (all labs ordered are listed, but only abnormal results are displayed) Labs Reviewed  URINALYSIS, ROUTINE W REFLEX MICROSCOPIC - Abnormal; Notable for the following components:      Result Value   Hgb urine dipstick MODERATE (*)    Ketones, ur 5 (*)    Protein, ur 30 (*)    All other components within normal limits  BASIC METABOLIC PANEL - Abnormal; Notable for the following components:   Potassium 5.4 (*)    CO2 19 (*)    Glucose, Bld 151 (*)    Creatinine, Ser 1.41 (*)    Calcium 8.7 (*)    GFR calc non Af Amer 51 (*)    GFR calc Af Amer 59 (*)    All other components within normal limits  CBC - Abnormal; Notable for the following components:   WBC 17.5 (*)    All other components within normal limits  CBG MONITORING, ED    EKG None  Radiology Ct Renal Stone Study  Result Date: 01/01/2018 CLINICAL DATA:  67 y/o  M; left flank pain. EXAM: CT ABDOMEN AND PELVIS WITHOUT CONTRAST TECHNIQUE: Multidetector CT imaging of the abdomen and pelvis was performed following the standard protocol without IV contrast. COMPARISON:  06/29/2017 CT abdomen and pelvis. FINDINGS: Lower chest: Coronary artery calcification. Hepatobiliary: No focal liver abnormality is seen. No gallstones, gallbladder wall thickening, or biliary dilatation. Pancreas: Unremarkable. No pancreatic ductal dilatation or surrounding  inflammatory changes. Spleen: Normal in size without focal abnormality. Adrenals/Urinary Tract: Normal adrenal glands. There multiple nonobstructing stones in the kidneys bilaterally measuring up to 8 mm in the lower pole of right kidney and  12 mm in the lower pole of the left kidney. Right kidney inter pole cm cyst. No right hydronephrosis. Normal bladder. Obstructing 6 mm stone within the left mid ureter at the L3-4 level with moderate upstream hydronephrosis and perinephric stranding. Stomach/Bowel: Stomach is within normal limits. Appendectomy. No evidence of bowel wall thickening, distention, or inflammatory changes. Vascular/Lymphatic: Aortic atherosclerosis. No enlarged abdominal or pelvic lymph nodes. Reproductive: Prostate is unremarkable. Other: No abdominal wall hernia or abnormality. No abdominopelvic ascites. Musculoskeletal: No fracture is seen. IMPRESSION: 1. Obstructing 6 mm stone within left mid ureter with moderate upstream hydronephrosis and perinephric stranding. 2. Multiple bilateral nonobstructing kidney stones. Electronically Signed   By: Kristine Garbe M.D.   On: 01/01/2018 15:08    Procedures Procedures (including critical care time)  Medications Ordered in ED Medications  HYDROmorphone (DILAUDID) injection 1 mg (1 mg Intramuscular Given 01/01/18 1432)  ondansetron (ZOFRAN-ODT) disintegrating tablet 4 mg (4 mg Oral Given 01/01/18 1433)  sodium chloride 0.9 % bolus 500 mL (0 mLs Intravenous Stopped 01/01/18 1759)  ketorolac (TORADOL) 15 MG/ML injection 15 mg (15 mg Intravenous Given 01/01/18 1658)     Initial Impression / Assessment and Plan / ED Course  I have reviewed the triage vital signs and the nursing notes.  Pertinent labs & imaging results that were available during my care of the patient were reviewed by me and considered in my medical decision making (see chart for details).     MDM  Screen complete  Patient is presenting with left flank pain.   Imaging results suggest left-sided renal colic.  Patient is improved following administration of pain medicines in the ED.  Other screening labs including UA are without evidence of acute pathology.  Patient's case discussed with on-call urologist Dr. Lovena Neighbours.  Patient is to be discharged home.  Patient has already established care with Dr. Jeffie Pollock in the past.  Patient understands the need to follow-up closely tomorrow with Dr. Jeffie Pollock.  Strict return precautions given and understood.  Final Clinical Impressions(s) / ED Diagnoses   Final diagnoses:  Renal colic    ED Discharge Orders        Ordered    HYDROmorphone (DILAUDID) 2 MG tablet  Every 6 hours PRN     01/01/18 1959    ondansetron (ZOFRAN) 4 MG tablet  Every 6 hours     01/01/18 1959       Valarie Merino, MD 01/01/18 2022

## 2018-01-01 NOTE — ED Provider Notes (Signed)
Patient placed in Quick Look pathway, seen and evaluated   Chief Complaint: left flank pain  HPI:   Pt reports he has pain in left lower abdomen and his back. Pt reports this feels like previous kidney stones  ROS:   Physical Exam:   Gen: No distress  Neuro: Awake and Alert  Skin: Warm    Focused Exam: abdomen soft nontender    Initiation of care has begun. The patient has been counseled on the process, plan, and necessity for staying for the completion/evaluation, and the remainder of the medical screening examination   Sidney Ace 01/01/18 [place], Ankit, MD 01/03/18 1515

## 2018-01-24 DIAGNOSIS — H43813 Vitreous degeneration, bilateral: Secondary | ICD-10-CM | POA: Diagnosis not present

## 2018-01-24 DIAGNOSIS — H3582 Retinal ischemia: Secondary | ICD-10-CM | POA: Diagnosis not present

## 2018-01-24 DIAGNOSIS — E113313 Type 2 diabetes mellitus with moderate nonproliferative diabetic retinopathy with macular edema, bilateral: Secondary | ICD-10-CM | POA: Diagnosis not present

## 2018-01-24 DIAGNOSIS — H2513 Age-related nuclear cataract, bilateral: Secondary | ICD-10-CM | POA: Diagnosis not present

## 2018-03-07 ENCOUNTER — Ambulatory Visit (INDEPENDENT_AMBULATORY_CARE_PROVIDER_SITE_OTHER): Payer: Medicare Other | Admitting: Internal Medicine

## 2018-03-07 ENCOUNTER — Encounter: Payer: Self-pay | Admitting: Internal Medicine

## 2018-03-07 VITALS — BP 146/78 | HR 84 | Temp 98.8°F | Resp 16 | Ht 73.0 in | Wt 247.5 lb

## 2018-03-07 DIAGNOSIS — E109 Type 1 diabetes mellitus without complications: Secondary | ICD-10-CM | POA: Diagnosis not present

## 2018-03-07 DIAGNOSIS — M5412 Radiculopathy, cervical region: Secondary | ICD-10-CM | POA: Diagnosis not present

## 2018-03-07 DIAGNOSIS — E785 Hyperlipidemia, unspecified: Secondary | ICD-10-CM

## 2018-03-07 DIAGNOSIS — E139 Other specified diabetes mellitus without complications: Secondary | ICD-10-CM | POA: Diagnosis not present

## 2018-03-07 DIAGNOSIS — M5030 Other cervical disc degeneration, unspecified cervical region: Secondary | ICD-10-CM

## 2018-03-07 DIAGNOSIS — Z794 Long term (current) use of insulin: Secondary | ICD-10-CM | POA: Diagnosis not present

## 2018-03-07 DIAGNOSIS — N181 Chronic kidney disease, stage 1: Secondary | ICD-10-CM | POA: Diagnosis not present

## 2018-03-07 DIAGNOSIS — E1022 Type 1 diabetes mellitus with diabetic chronic kidney disease: Secondary | ICD-10-CM | POA: Diagnosis not present

## 2018-03-07 DIAGNOSIS — I10 Essential (primary) hypertension: Secondary | ICD-10-CM | POA: Diagnosis not present

## 2018-03-07 MED ORDER — PITAVASTATIN CALCIUM 2 MG PO TABS: 2.0000 mg | ORAL_TABLET | Freq: Every day | ORAL | 1 refills | Status: DC

## 2018-03-07 MED ORDER — METAXALONE 400 MG PO TABS: 400.0000 mg | ORAL_TABLET | Freq: Three times a day (TID) | ORAL | 2 refills | Status: DC

## 2018-03-07 NOTE — Progress Notes (Signed)
Subjective:  Patient ID: Merrilee Seashore., male    DOB: 11-Jan-1951  Age: 67 y.o. MRN: [id number]  CC: Hypertension; Neck Pain; and Hyperlipidemia   HPI Oleg Oleson. presents for f/up - He suffers from chronic, intermittent neck pain ("tightness").  About 2 years ago he had episode of neck pain that radiated to the left side but today he complains of a 3-week history of neck pain that right radiates into the right upper extremity with mild numbness, weakness, tingling in his right upper extremity.  He denies any recent trauma or injury.  Outpatient Medications Prior to Visit  Medication Sig Dispense Refill  . aspirin EC 81 MG tablet Take 81 mg by mouth daily.    . Cholecalciferol (VITAMIN D) 2000 units tablet Take 2,000 Units by mouth daily.    . Ciclopirox 0.77 % gel APPLY TWICE A DAY TO AFFECTED AREA. 30 g 2  . Cyanocobalamin 3000 MCG CAPS Take 3,000 mcg by mouth daily.    . diclofenac sodium (VOLTAREN) 1 % GEL Apply 4 g topically 4 (four) times daily. 1 Tube 3  . ezetimibe (ZETIA) 10 MG tablet TAKE 1 TABLET BY MOUTH EVERY DAY 90 tablet 1  . fluticasone (FLONASE) 50 MCG/ACT nasal spray Place 2 sprays into both nostrils daily. 16 g 2  . Insulin Glargine (LANTUS SOLOSTAR [place]) Inject 50 Units into the skin 2 (two) times daily as needed. Only uses when pump goes out    . Insulin Human (INSULIN PUMP) SOLN Inject 1 each into the skin continuous. Novolog insulin    . levocetirizine (XYZAL) 5 MG tablet Take 1 tablet (5 mg total) by mouth every evening. 90 tablet 1  . meloxicam (MOBIC) 15 MG tablet TAKE 1 TABLET (15 MG TOTAL) BY MOUTH DAILY. 90 tablet 0  . NOVOLOG 100 UNIT/ML injection     . Polyethyl Glycol-Propyl Glycol (SYSTANE OP) Place 1 drop into both eyes every 8 (eight) hours as needed (dry eyes).    . Potassium Citrate 15 MEQ (1620 MG) TBCR Take 1 tablet by mouth 2 (two) times daily.    . ramipril (ALTACE) 10 MG tablet Take 10 mg by mouth daily before breakfast.     . sildenafil  (REVATIO) 20 MG tablet Take 20 mg by mouth daily as needed (erectile dysfunction).    . tamsulosin (FLOMAX) 0.4 MG CAPS capsule Take 1 capsule (0.4 mg total) by mouth daily. 30 capsule 0  . Pitavastatin Calcium (LIVALO) 2 MG TABS Take 2 mg by mouth daily.     Marland Kitchen FARXIGA 10 MG TABS tablet TAKE 10 MG BY MOUTH DAILY. 90 tablet 1  . HYDROmorphone (DILAUDID) 2 MG tablet Take 0.5 tablets (1 mg total) by mouth every 6 (six) hours as needed for severe pain. 12 tablet 0  . methylPREDNISolone (MEDROL DOSEPAK) 4 MG TBPK tablet TAKE AS DIRECTED 21 tablet 0  . ondansetron (ZOFRAN) 4 MG tablet Take 1 tablet (4 mg total) by mouth every 6 (six) hours. 12 tablet 0  . promethazine-dextromethorphan (PROMETHAZINE-DM) 6.25-15 MG/5ML syrup Take 5 mLs by mouth 4 (four) times daily as needed for cough. 118 mL 0   No facility-administered medications prior to visit.     ROS Review of Systems  Constitutional: Negative for chills and fever.  HENT: Negative.  Negative for sore throat and trouble swallowing.   Eyes: Negative.   Respiratory: Negative.  Negative for cough, chest tightness, shortness of breath and wheezing.   Cardiovascular: Negative for chest pain, palpitations  and leg swelling.  Gastrointestinal: Negative for abdominal pain, constipation and diarrhea.  Genitourinary: Negative.  Negative for difficulty urinating.  Musculoskeletal: Positive for neck pain. Negative for arthralgias.  Skin: Negative.  Negative for rash.  Neurological: Positive for dizziness, weakness and numbness. Negative for tremors and headaches.  Hematological: Negative for adenopathy. Does not bruise/bleed easily.  Psychiatric/Behavioral: Negative.     Objective:  BP (!) 146/78 (BP Location: Left Arm, Patient Position: Sitting, Cuff Size: Large)   Pulse 84   Temp 98.8 F (37.1 C) (Oral)   Resp 16   Ht 6\' 1"  (1.854 m)   Wt 247 lb 8 oz (112.3 kg)   SpO2 97%   BMI 32.65 kg/m   BP Readings from Last 3 Encounters:  03/07/18 (!)  146/78  01/01/18 (!) 147/64  09/26/17 138/80    Wt Readings from Last 3 Encounters:  03/07/18 247 lb 8 oz (112.3 kg)  09/26/17 258 lb (117 kg)  03/22/16 241 lb 4 oz (109.4 kg)    Physical Exam  Constitutional: He is oriented to person, place, and time. No distress.  HENT:  Mouth/Throat: No oropharyngeal exudate.  Eyes: Conjunctivae are normal. No scleral icterus.  Neck: Normal range of motion. Neck supple. No JVD present. No thyromegaly present.  Cardiovascular: Normal rate, regular rhythm and normal heart sounds. Exam reveals no friction rub.  No murmur heard. Pulmonary/Chest: Effort normal and breath sounds normal. He has no wheezes. He has no rales.  Abdominal: Soft. Bowel sounds are normal. He exhibits no mass. There is no hepatosplenomegaly. There is no tenderness.  Musculoskeletal: Normal range of motion. He exhibits no edema, tenderness or deformity.       Cervical back: He exhibits spasm. He exhibits normal range of motion, no tenderness, no bony tenderness, no swelling, no deformity and no pain.  Lymphadenopathy:    He has no cervical adenopathy.  Neurological: He is alert and oriented to person, place, and time. He has normal strength. He displays no atrophy and no tremor. He exhibits normal muscle tone. He displays a negative Romberg sign. He displays no seizure activity. Coordination and gait normal.  Reflex Scores:      Tricep reflexes are 0 on the right side and 0 on the left side.      Bicep reflexes are 0 on the right side and 0 on the left side.      Brachioradialis reflexes are 0 on the right side and 0 on the left side.      Patellar reflexes are 0 on the right side and 0 on the left side.      Achilles reflexes are 0 on the right side and 0 on the left side. Skin: Skin is warm and dry. No rash noted. He is not diaphoretic.  Vitals reviewed.   Lab Results  Component Value Date   WBC 17.5 (H) 01/01/2018   HGB 15.5 01/01/2018   HCT 48.0 01/01/2018   PLT 191  01/01/2018   GLUCOSE 151 (H) 01/01/2018   CHOL 110 08/23/2017   TRIG 68 08/23/2017   HDL 38 08/23/2017   LDLCALC 58 08/23/2017   ALT 18 08/23/2017   AST 13 (A) 08/23/2017   NA 136 01/01/2018   K 5.4 (H) 01/01/2018   CL 106 01/01/2018   CREATININE 1.41 (H) 01/01/2018   BUN 15 01/01/2018   CO2 19 (L) 01/01/2018   TSH 0.79 08/23/2017   PSA 3.63 12/21/2017   INR 1.02 08/06/2015   HGBA1C 7.1 11/24/2017  MICROALBUR 4.46 01/30/2017    Ct Renal Stone Study  Result Date: 01/01/2018 CLINICAL DATA:  67 y/o  M; left flank pain. EXAM: CT ABDOMEN AND PELVIS WITHOUT CONTRAST TECHNIQUE: Multidetector CT imaging of the abdomen and pelvis was performed following the standard protocol without IV contrast. COMPARISON:  06/29/2017 CT abdomen and pelvis. FINDINGS: Lower chest: Coronary artery calcification. Hepatobiliary: No focal liver abnormality is seen. No gallstones, gallbladder wall thickening, or biliary dilatation. Pancreas: Unremarkable. No pancreatic ductal dilatation or surrounding inflammatory changes. Spleen: Normal in size without focal abnormality. Adrenals/Urinary Tract: Normal adrenal glands. There multiple nonobstructing stones in the kidneys bilaterally measuring up to 8 mm in the lower pole of right kidney and 12 mm in the lower pole of the left kidney. Right kidney inter pole cm cyst. No right hydronephrosis. Normal bladder. Obstructing 6 mm stone within the left mid ureter at the L3-4 level with moderate upstream hydronephrosis and perinephric stranding. Stomach/Bowel: Stomach is within normal limits. Appendectomy. No evidence of bowel wall thickening, distention, or inflammatory changes. Vascular/Lymphatic: Aortic atherosclerosis. No enlarged abdominal or pelvic lymph nodes. Reproductive: Prostate is unremarkable. Other: No abdominal wall hernia or abnormality. No abdominopelvic ascites. Musculoskeletal: No fracture is seen. IMPRESSION: 1. Obstructing 6 mm stone within left mid ureter with  moderate upstream hydronephrosis and perinephric stranding. 2. Multiple bilateral nonobstructing kidney stones. Electronically Signed   By: Kristine Garbe M.D.   On: 01/01/2018 15:08    Assessment & Plan:   Ale[place]ander was seen today for hypertension, neck pain and hyperlipidemia.  Diagnoses and all orders for this visit:  LADA (latent autoimmune diabetes in adults), managed as type 1 ([place]) -     Urinalysis, Routine w reflex microscopic; Future -     Microalbumin / creatinine urine ratio; Future  Essential hypertension-His blood pressure is adequately well controlled. -     Urinalysis, Routine w reflex microscopic; Future  Hyperlipidemia with target LDL less than 100 -     Pitavastatin Calcium (LIVALO) 2 MG TABS; Take 1 tablet (2 mg total) by mouth daily.  Radiculitis of right cervical region- He has radiating neck pain and paresthesias but on exam he is neurologically intact.  He had plain films done 2 years ago that showed minimal DDD.  I have asked him to undergo an MRI to assess for spinal stenosis, nerve impingement, or worsening degenerative changes that would indicate need for epidural steroid injection or surgery.  I have offered a muscle relaxer for symptom relief. -     MR Cervical Spine Wo Contrast; Future -     metaxalone (SKELAXIN) 400 MG tablet; Take 1 tablet (400 mg total) by mouth 3 (three) times daily.  DDD (degenerative disc disease), cervical -     MR Cervical Spine Wo Contrast; Future -     metaxalone (SKELAXIN) 400 MG tablet; Take 1 tablet (400 mg total) by mouth 3 (three) times daily.   I have discontinued Merrilee Seashore. "Tommy"'s FARXIGA, methylPREDNISolone, promethazine-dextromethorphan, HYDROmorphone, and ondansetron. I have also changed his Pitavastatin Calcium. Additionally, I am having him start on metaxalone. Lastly, I am having him maintain his ramipril, Insulin Glargine (LANTUS SOLOSTAR [place]), Potassium Citrate, aspirin EC, tamsulosin, Cyanocobalamin,  Polyethyl Glycol-Propyl Glycol (SYSTANE OP), insulin pump, sildenafil, Vitamin D, fluticasone, diclofenac sodium, NOVOLOG, levocetirizine, Ciclopirox, ezetimibe, and meloxicam.  Meds ordered this encounter  Medications  . metaxalone (SKELAXIN) 400 MG tablet    Sig: Take 1 tablet (400 mg total) by mouth 3 (three) times daily.  Dispense:  90 tablet    Refill:  2  . Pitavastatin Calcium (LIVALO) 2 MG TABS    Sig: Take 1 tablet (2 mg total) by mouth daily.    Dispense:  90 tablet    Refill:  1     Follow-up: Return in about 6 weeks (around 04/18/2018).  Scarlette Calico, MD

## 2018-03-07 NOTE — Patient Instructions (Signed)

## 2018-03-21 ENCOUNTER — Other Ambulatory Visit: Payer: Self-pay | Admitting: Internal Medicine

## 2018-03-21 DIAGNOSIS — J301 Allergic rhinitis due to pollen: Secondary | ICD-10-CM

## 2018-04-02 ENCOUNTER — Ambulatory Visit
Admission: RE | Admit: 2018-04-02 | Discharge: 2018-04-02 | Disposition: A | Discharge status: Home / Self Care | Payer: Medicare Other | Source: Ambulatory Visit | Attending: Internal Medicine | Admitting: Internal Medicine

## 2018-04-02 ENCOUNTER — Encounter: Payer: Self-pay | Admitting: Internal Medicine

## 2018-04-02 DIAGNOSIS — M5030 Other cervical disc degeneration, unspecified cervical region: Secondary | ICD-10-CM

## 2018-04-02 DIAGNOSIS — M5412 Radiculopathy, cervical region: Secondary | ICD-10-CM

## 2018-04-02 DIAGNOSIS — M542 Cervicalgia: Secondary | ICD-10-CM | POA: Diagnosis not present

## 2018-04-04 ENCOUNTER — Ambulatory Visit (INDEPENDENT_AMBULATORY_CARE_PROVIDER_SITE_OTHER): Payer: Medicare Other | Admitting: Internal Medicine

## 2018-04-04 ENCOUNTER — Encounter: Payer: Self-pay | Admitting: Internal Medicine

## 2018-04-04 VITALS — BP 154/80 | HR 73 | Temp 98.0°F | Ht 73.0 in | Wt 250.8 lb

## 2018-04-04 DIAGNOSIS — Z23 Encounter for immunization: Secondary | ICD-10-CM

## 2018-04-04 DIAGNOSIS — M4802 Spinal stenosis, cervical region: Secondary | ICD-10-CM

## 2018-04-04 NOTE — Patient Instructions (Signed)
Neck Exercises Neck exercises can be important for many reasons:  They can help you to improve and maintain flexibility in your neck. This can be especially important as you age.  They can help to make your neck stronger. This can make movement easier.  They can reduce or prevent neck pain.  They may help your upper back.  Ask your health care provider which neck exercises would be best for you. Exercises Neck Press Repeat this exercise 10 times. Do it first thing in the morning and right before bed or as told by your health care provider. 1. Lie on your back on a firm bed or on the floor with a pillow under your head. 2. Use your neck muscles to push your head down on the pillow and straighten your spine. 3. Hold the position as well as you can. Keep your head facing up and your chin tucked. 4. Slowly count to 5 while holding this position. 5. Relax for a few seconds. Then repeat.  Isometric Strengthening Do a full set of these exercises 2 times a day or as told by your health care provider. 1. Sit in a supportive chair and place your hand on your forehead. 2. Push forward with your head and neck while pushing back with your hand. Hold for 10 seconds. 3. Relax. Then repeat the exercise 3 times. 4. Next, do thesequence again, this time putting your hand against the back of your head. Use your head and neck to push backward against the hand pressure. 5. Finally, do the same exercise on either side of your head, pushing sideways against the pressure of your hand.  Prone Head Lifts Repeat this exercise 5 times. Do this 2 times a day or as told by your health care provider. 1. Lie face-down, resting on your elbows so that your chest and upper back are raised. 2. Start with your head facing downward, near your chest. Position your chin either on or near your chest. 3. Slowly lift your head upward. Lift until you are looking straight ahead. Then continue lifting your head as far back as  you can stretch. 4. Hold your head up for 5 seconds. Then slowly lower it to your starting position.  Supine Head Lifts Repeat this exercise 8-10 times. Do this 2 times a day or as told by your health care provider. 1. Lie on your back, bending your knees to point to the ceiling and keeping your feet flat on the floor. 2. Lift your head slowly off the floor, raising your chin toward your chest. 3. Hold for 5 seconds. 4. Relax and repeat.  Scapular Retraction Repeat this exercise 5 times. Do this 2 times a day or as told by your health care provider. 1. Stand with your arms at your sides. Look straight ahead. 2. Slowly pull both shoulders backward and downward until you feel a stretch between your shoulder blades in your upper back. 3. Hold for 10-30 seconds. 4. Relax and repeat.  Contact a health care provider if:  Your neck pain or discomfort gets much worse when you do an exercise.  Your neck pain or discomfort does not improve within 2 hours after you exercise. If you have any of these problems, stop exercising right away. Do not do the exercises again unless your health care provider says that you can. Get help right away if:  You develop sudden, severe neck pain. If this happens, stop exercising right away. Do not do the exercises again unless your   health care provider says that you can. Exercises Neck Stretch  Repeat this exercise 3-5 times. 1. Do this exercise while standing or while sitting in a chair. 2. Place your feet flat on the floor, shoulder-width apart. 3. Slowly turn your head to the right. Turn it all the way to the right so you can look over your right shoulder. Do not tilt or tip your head. 4. Hold this position for 10-30 seconds. 5. Slowly turn your head to the left, to look over your left shoulder. 6. Hold this position for 10-30 seconds.  Neck Retraction Repeat this exercise 8-10 times. Do this 3-4 times a day or as told by your health care  provider. 1. Do this exercise while standing or while sitting in a sturdy chair. 2. Look straight ahead. Do not bend your neck. 3. Use your fingers to push your chin backward. Do not bend your neck for this movement. Continue to face straight ahead. If you are doing the exercise properly, you will feel a slight sensation in your throat and a stretch at the back of your neck. 4. Hold the stretch for 1-2 seconds. Relax and repeat.  This information is not intended to replace advice given to you by your health care provider. Make sure you discuss any questions you have with your health care provider. Document Released: 06/15/2015 Document Revised: 12/10/2015 Document Reviewed: 01/12/2015 Elsevier Interactive Patient Education  2018 [employer]  

## 2018-04-04 NOTE — Progress Notes (Signed)
Subjective:  Patient ID: Merrilee Seashore., male    DOB: 11/01/1950  Age: 67 y.o. MRN: [id number]  CC: Neck Pain   HPI [place] Lozito. presents for f/up - He recently underwent a cervical spine MRI as an evaluation of neck pain with tingling in his right upper extremity.  It was remarkable for spinal stenosis in the lower cervical spines.  His symptoms have not changed.  He continues to complain of discomfort in his neck that is adequately well controlled and tingling in his right upper extremity.  He denies any upper or lower extremity weakness or incoordination.  Outpatient Medications Prior to Visit  Medication Sig Dispense Refill  . aspirin EC 81 MG tablet Take 81 mg by mouth daily.    . Cholecalciferol (VITAMIN D) 2000 units tablet Take 2,000 Units by mouth daily.    . Ciclopirox 0.77 % gel APPLY TWICE A DAY TO AFFECTED AREA. 30 g 2  . Cyanocobalamin 3000 MCG CAPS Take 3,000 mcg by mouth daily.    . diclofenac sodium (VOLTAREN) 1 % GEL Apply 4 g topically 4 (four) times daily. 1 Tube 3  . ezetimibe (ZETIA) 10 MG tablet TAKE 1 TABLET BY MOUTH EVERY DAY 90 tablet 1  . fluticasone (FLONASE) 50 MCG/ACT nasal spray Place 2 sprays into both nostrils daily. 16 g 2  . Insulin Glargine (LANTUS SOLOSTAR [place]) Inject 50 Units into the skin 2 (two) times daily as needed. Only uses when pump goes out    . Insulin Human (INSULIN PUMP) SOLN Inject 1 each into the skin continuous. Novolog insulin    . levocetirizine (XYZAL) 5 MG tablet TAKE 1 TABLET BY MOUTH EVERY DAY IN THE EVENING 90 tablet 1  . meloxicam (MOBIC) 15 MG tablet TAKE 1 TABLET (15 MG TOTAL) BY MOUTH DAILY. 90 tablet 0  . metaxalone (SKELAXIN) 400 MG tablet Take 1 tablet (400 mg total) by mouth 3 (three) times daily. 90 tablet 2  . NOVOLOG 100 UNIT/ML injection     . Pitavastatin Calcium (LIVALO) 2 MG TABS Take 1 tablet (2 mg total) by mouth daily. 90 tablet 1  . Polyethyl Glycol-Propyl Glycol (SYSTANE OP) Place 1 drop into both eyes  every 8 (eight) hours as needed (dry eyes).    . Potassium Citrate 15 MEQ (1620 MG) TBCR Take 1 tablet by mouth 2 (two) times daily.    . ramipril (ALTACE) 10 MG tablet Take 10 mg by mouth daily before breakfast.     . sildenafil (REVATIO) 20 MG tablet Take 20 mg by mouth daily as needed (erectile dysfunction).    . tamsulosin (FLOMAX) 0.4 MG CAPS capsule Take 1 capsule (0.4 mg total) by mouth daily. 30 capsule 0   No facility-administered medications prior to visit.     ROS Review of Systems  Constitutional: Negative.  Negative for diaphoresis, fatigue and unexpected weight change.  HENT: Negative.  Negative for trouble swallowing.   Eyes: Negative for visual disturbance.  Respiratory: Negative for cough, chest tightness, shortness of breath and wheezing.   Cardiovascular: Negative for chest pain, palpitations and leg swelling.  Gastrointestinal: Negative for abdominal pain, diarrhea and vomiting.  Endocrine: Negative.   Genitourinary: Negative.  Negative for difficulty urinating.  Musculoskeletal: Positive for neck pain. Negative for arthralgias and back pain.  Skin: Negative.   Neurological: Negative for dizziness, weakness, light-headedness, numbness and headaches.  Hematological: Negative for adenopathy. Does not bruise/bleed easily.  Psychiatric/Behavioral: Negative.     Objective:  BP Marland Kitchen)  154/80 (BP Location: Left Arm, Patient Position: Sitting, Cuff Size: Large)   Pulse 73   Temp 98 F (36.7 C) (Oral)   Ht 6\' 1"  (1.854 m)   Wt 250 lb 12 oz (113.7 kg)   SpO2 96%   BMI 33.08 kg/m   BP Readings from Last 3 Encounters:  04/04/18 (!) 154/80  03/07/18 (!) 146/78  01/01/18 (!) 147/64    Wt Readings from Last 3 Encounters:  04/04/18 250 lb 12 oz (113.7 kg)  03/07/18 247 lb 8 oz (112.3 kg)  09/26/17 258 lb (117 kg)    Physical Exam  Constitutional: He is oriented to person, place, and time. No distress.  HENT:  Mouth/Throat: Oropharynx is clear and moist. No  oropharyngeal exudate.  Eyes: Conjunctivae are normal. No scleral icterus.  Neck: Normal range of motion. Neck supple. No JVD present. No thyromegaly present.  Cardiovascular: Normal rate and regular rhythm.  Pulmonary/Chest: Effort normal and breath sounds normal. He has no wheezes. He has no rales.  Abdominal: Soft. Bowel sounds are normal. He exhibits no mass. There is no guarding.  Musculoskeletal: Normal range of motion. He exhibits no edema, tenderness or deformity.       Cervical back: Normal. He exhibits normal range of motion, no tenderness and no bony tenderness.  Neurological: He is alert and oriented to person, place, and time. He has normal strength. He displays no atrophy and no tremor. No cranial nerve deficit or sensory deficit. He exhibits normal muscle tone. He displays a negative Romberg sign. He displays no seizure activity. Coordination and gait normal.  Reflex Scores:      Tricep reflexes are 0 on the right side and 0 on the left side.      Bicep reflexes are 0 on the right side and 0 on the left side.      Brachioradialis reflexes are 0 on the right side and 0 on the left side.      Patellar reflexes are 0 on the right side and 0 on the left side.      Achilles reflexes are 0 on the right side and 0 on the left side. Skin: Skin is warm and dry. No rash noted. He is not diaphoretic.  Vitals reviewed.   Lab Results  Component Value Date   WBC 17.5 (H) 01/01/2018   HGB 15.5 01/01/2018   HCT 48.0 01/01/2018   PLT 191 01/01/2018   GLUCOSE 151 (H) 01/01/2018   CHOL 110 08/23/2017   TRIG 68 08/23/2017   HDL 38 08/23/2017   LDLCALC 58 08/23/2017   ALT 18 08/23/2017   AST 13 (A) 08/23/2017   NA 136 01/01/2018   K 5.4 (H) 01/01/2018   CL 106 01/01/2018   CREATININE 1.41 (H) 01/01/2018   BUN 15 01/01/2018   CO2 19 (L) 01/01/2018   TSH 0.79 08/23/2017   PSA 3.63 12/21/2017   INR 1.02 08/06/2015   HGBA1C 7.1 11/24/2017   MICROALBUR 4.46 01/30/2017    Mr Cervical  Spine Wo Contrast  Result Date: 04/02/2018 CLINICAL DATA:  Right-sided neck pain with radiation down into the right wrist EXAM: MRI CERVICAL SPINE WITHOUT CONTRAST TECHNIQUE: Multiplanar, multisequence MR imaging of the cervical spine was performed. No intravenous contrast was administered. COMPARISON:  None. FINDINGS: Alignment: Physiologic. Vertebrae: No fracture, evidence of discitis, or bone lesion. Cord: Normal signal and morphology. Posterior Fossa, vertebral arteries, paraspinal tissues: Posterior fossa demonstrates no focal abnormality. Vertebral artery flow voids are maintained. Paraspinal soft tissues  are unremarkable. Disc levels: Discs: Degenerative disc disease mild disc height loss at C3-4, C4-5, C5-6 and C6-7. C2-3: No significant disc bulge. No neural foraminal stenosis. No central canal stenosis. C3-4: Broad-based disc bulge. Bilateral uncovertebral degenerative changes. Severe right and moderate left foraminal stenosis. Mild spinal stenosis. C4-5: Broad-based disc bulge. Bilateral uncovertebral degenerative changes. Severe bilateral foraminal stenosis. Mild spinal stenosis. C5-6: Broad-based disc bulge. Mild left foraminal narrowing. No right foraminal narrowing. No central canal stenosis. C6-7: Mild broad-based disc bulge. Left uncovertebral degenerative changes. Mild left foraminal stenosis. No central canal stenosis. C7-T1: Shallow broad right paracentral disc protrusion. No neural foraminal stenosis. No central canal stenosis. IMPRESSION: 1. Diffuse cervical spine spondylosis as described above. 2.  No acute osseous injury of the cervical spine. Electronically Signed   By: Kathreen Devoid   On: 04/02/2018 13:05    Assessment & Plan:   [place] was seen today for neck pain.  Diagnoses and all orders for this visit:  Spinal stenosis in cervical region- We discussed his treatment options and he is not interested in seeing a neurosurgeon at this time.  He wants to consider more conservative  options such as PT and epidural steroid injections.  I have therefore referred him to pain management.  He is getting adequate symptom relief with over-the-counter remedies. -     Ambulatory referral to Pain Clinic  Need for influenza vaccination -     Flu vaccine HIGH DOSE PF (Fluzone High dose)   I am having Merrilee Seashore. "Tommy" maintain his ramipril, Insulin Glargine (LANTUS SOLOSTAR [place]), Potassium Citrate, aspirin EC, tamsulosin, Cyanocobalamin, Polyethyl Glycol-Propyl Glycol (SYSTANE OP), insulin pump, sildenafil, Vitamin D, fluticasone, diclofenac sodium, NOVOLOG, Ciclopirox, ezetimibe, meloxicam, metaxalone, Pitavastatin Calcium, and levocetirizine.  No orders of the defined types were placed in this encounter.    Follow-up: Return if symptoms worsen or fail to improve.  Scarlette Calico, MD

## 2018-04-25 DIAGNOSIS — M79601 Pain in right arm: Secondary | ICD-10-CM | POA: Diagnosis not present

## 2018-04-25 DIAGNOSIS — M542 Cervicalgia: Secondary | ICD-10-CM | POA: Diagnosis not present

## 2018-04-25 DIAGNOSIS — M4712 Other spondylosis with myelopathy, cervical region: Secondary | ICD-10-CM | POA: Diagnosis not present

## 2018-04-25 DIAGNOSIS — M5030 Other cervical disc degeneration, unspecified cervical region: Secondary | ICD-10-CM | POA: Diagnosis not present

## 2018-05-02 ENCOUNTER — Other Ambulatory Visit: Payer: Self-pay | Admitting: Internal Medicine

## 2018-05-02 DIAGNOSIS — E785 Hyperlipidemia, unspecified: Secondary | ICD-10-CM

## 2018-05-09 ENCOUNTER — Ambulatory Visit
Admission: RE | Admit: 2018-05-09 | Discharge: 2018-05-09 | Disposition: A | Discharge status: Home / Self Care | Payer: Medicare Other | Source: Ambulatory Visit | Attending: Pain Medicine | Admitting: Pain Medicine

## 2018-05-09 ENCOUNTER — Other Ambulatory Visit: Payer: Self-pay | Admitting: Pain Medicine

## 2018-05-09 DIAGNOSIS — M545 Low back pain, unspecified: Secondary | ICD-10-CM

## 2018-05-12 ENCOUNTER — Other Ambulatory Visit: Payer: Self-pay | Admitting: Internal Medicine

## 2018-05-12 DIAGNOSIS — M5030 Other cervical disc degeneration, unspecified cervical region: Secondary | ICD-10-CM

## 2018-05-12 DIAGNOSIS — M5412 Radiculopathy, cervical region: Secondary | ICD-10-CM

## 2018-05-15 DIAGNOSIS — M5412 Radiculopathy, cervical region: Secondary | ICD-10-CM | POA: Diagnosis not present

## 2018-05-15 DIAGNOSIS — M79609 Pain in unspecified limb: Secondary | ICD-10-CM | POA: Diagnosis not present

## 2018-05-20 ENCOUNTER — Other Ambulatory Visit: Payer: Self-pay | Admitting: Internal Medicine

## 2018-05-20 DIAGNOSIS — M5030 Other cervical disc degeneration, unspecified cervical region: Secondary | ICD-10-CM

## 2018-05-21 DIAGNOSIS — R202 Paresthesia of skin: Secondary | ICD-10-CM | POA: Diagnosis not present

## 2018-05-21 DIAGNOSIS — M5030 Other cervical disc degeneration, unspecified cervical region: Secondary | ICD-10-CM | POA: Diagnosis not present

## 2018-05-21 DIAGNOSIS — M4712 Other spondylosis with myelopathy, cervical region: Secondary | ICD-10-CM | POA: Diagnosis not present

## 2018-05-30 DIAGNOSIS — E113313 Type 2 diabetes mellitus with moderate nonproliferative diabetic retinopathy with macular edema, bilateral: Secondary | ICD-10-CM | POA: Diagnosis not present

## 2018-05-30 DIAGNOSIS — H2513 Age-related nuclear cataract, bilateral: Secondary | ICD-10-CM | POA: Diagnosis not present

## 2018-05-30 DIAGNOSIS — H3582 Retinal ischemia: Secondary | ICD-10-CM | POA: Diagnosis not present

## 2018-05-30 DIAGNOSIS — H43813 Vitreous degeneration, bilateral: Secondary | ICD-10-CM | POA: Diagnosis not present

## 2018-05-30 LAB — HM DIABETES EYE EXAM

## 2018-06-06 DIAGNOSIS — M4712 Other spondylosis with myelopathy, cervical region: Secondary | ICD-10-CM | POA: Diagnosis not present

## 2018-06-06 DIAGNOSIS — M5030 Other cervical disc degeneration, unspecified cervical region: Secondary | ICD-10-CM | POA: Diagnosis not present

## 2018-06-08 DIAGNOSIS — N181 Chronic kidney disease, stage 1: Secondary | ICD-10-CM | POA: Diagnosis not present

## 2018-06-08 DIAGNOSIS — E785 Hyperlipidemia, unspecified: Secondary | ICD-10-CM | POA: Diagnosis not present

## 2018-06-08 DIAGNOSIS — Z794 Long term (current) use of insulin: Secondary | ICD-10-CM | POA: Diagnosis not present

## 2018-06-08 DIAGNOSIS — E1022 Type 1 diabetes mellitus with diabetic chronic kidney disease: Secondary | ICD-10-CM | POA: Diagnosis not present

## 2018-06-08 DIAGNOSIS — E109 Type 1 diabetes mellitus without complications: Secondary | ICD-10-CM | POA: Diagnosis not present

## 2018-06-13 ENCOUNTER — Other Ambulatory Visit: Payer: Self-pay | Admitting: Internal Medicine

## 2018-06-13 DIAGNOSIS — M5412 Radiculopathy, cervical region: Secondary | ICD-10-CM

## 2018-06-13 DIAGNOSIS — M5030 Other cervical disc degeneration, unspecified cervical region: Secondary | ICD-10-CM

## 2018-06-22 DIAGNOSIS — R972 Elevated prostate specific antigen [PSA]: Secondary | ICD-10-CM | POA: Diagnosis not present

## 2018-06-22 DIAGNOSIS — N200 Calculus of kidney: Secondary | ICD-10-CM | POA: Diagnosis not present

## 2018-06-22 LAB — PSA: PSA: 4.14

## 2018-06-23 ENCOUNTER — Other Ambulatory Visit: Payer: Self-pay | Admitting: Internal Medicine

## 2018-06-27 DIAGNOSIS — N401 Enlarged prostate with lower urinary tract symptoms: Secondary | ICD-10-CM | POA: Diagnosis not present

## 2018-06-27 DIAGNOSIS — N200 Calculus of kidney: Secondary | ICD-10-CM | POA: Diagnosis not present

## 2018-06-27 DIAGNOSIS — Z8551 Personal history of malignant neoplasm of bladder: Secondary | ICD-10-CM | POA: Diagnosis not present

## 2018-06-27 DIAGNOSIS — R3121 Asymptomatic microscopic hematuria: Secondary | ICD-10-CM | POA: Diagnosis not present

## 2018-06-29 ENCOUNTER — Other Ambulatory Visit: Payer: Self-pay | Admitting: Urology

## 2018-07-04 DIAGNOSIS — R202 Paresthesia of skin: Secondary | ICD-10-CM | POA: Diagnosis not present

## 2018-07-04 DIAGNOSIS — M79601 Pain in right arm: Secondary | ICD-10-CM | POA: Diagnosis not present

## 2018-07-04 DIAGNOSIS — M542 Cervicalgia: Secondary | ICD-10-CM | POA: Diagnosis not present

## 2018-07-04 DIAGNOSIS — M4712 Other spondylosis with myelopathy, cervical region: Secondary | ICD-10-CM | POA: Diagnosis not present

## 2018-07-16 ENCOUNTER — Encounter: Payer: Self-pay | Admitting: Internal Medicine

## 2018-07-17 ENCOUNTER — Ambulatory Visit (INDEPENDENT_AMBULATORY_CARE_PROVIDER_SITE_OTHER): Payer: Medicare Other | Admitting: Internal Medicine

## 2018-07-17 ENCOUNTER — Encounter: Payer: Self-pay | Admitting: Internal Medicine

## 2018-07-17 DIAGNOSIS — F17210 Nicotine dependence, cigarettes, uncomplicated: Secondary | ICD-10-CM

## 2018-07-17 DIAGNOSIS — J069 Acute upper respiratory infection, unspecified: Secondary | ICD-10-CM

## 2018-07-17 DIAGNOSIS — B9789 Other viral agents as the cause of diseases classified elsewhere: Secondary | ICD-10-CM

## 2018-07-17 DIAGNOSIS — Z720 Tobacco use: Secondary | ICD-10-CM

## 2018-07-17 MED ORDER — PROMETHAZINE-DM 6.25-15 MG/5ML PO SYRP: 5.0000 mL | ORAL_SOLUTION | Freq: Two times a day (BID) | ORAL | 0 refills | Status: DC

## 2018-07-17 MED ORDER — METHYLPREDNISOLONE ACETATE 40 MG/ML IJ SUSP
40.0000 mg | Freq: Once | INTRAMUSCULAR | Status: AC
  Administered 2018-07-17: 40 mg via INTRAMUSCULAR

## 2018-07-17 MED ORDER — FLUTICASONE PROPIONATE 50 MCG/ACT NA SUSP: 2.0000 | Freq: Every day | NASAL | 0 refills | Status: DC

## 2018-07-17 NOTE — Patient Instructions (Signed)
We have sent in flonase for you to use 2 sprays in each nostril once a day for the next 1-2 weeks.   It is safe to use the promethazine/DM cough syrup you have at home.   We have given you a shot today to help you get better sooner.   [place]

## 2018-07-17 NOTE — Assessment & Plan Note (Signed)
Time spent counseling about tobacco usage: 3 minutes. I have asked about smoking and is smoking less than usual. The patient is advised to quit. The patient is willing to quit. They would like to try to quit in the next 1 months. We will follow up with them as needed.

## 2018-07-17 NOTE — Progress Notes (Signed)
   Subjective:   Patient ID: Anthony Seashore., male    DOB: Oct 15, 1950, 67 y.o.   MRN: [id number]  HPI The patient is a 67 y.o. man coming in for cold symptoms. Started 3-4 days ago. Main symptoms are: aches and congestion and cough and mild SOB, no sputum production. Denies fevers. Overall it is worsening. Has tried mucinex and advil. Having some sore throat as well. Is a current smoker. Smoking slightly less lately due to SOB.   Review of Systems  Constitutional: Positive for activity change, appetite change and chills. Negative for fatigue, fever and unexpected weight change.  HENT: Positive for congestion, postnasal drip, rhinorrhea and sinus pressure. Negative for ear discharge, ear pain, sinus pain, sneezing, sore throat, tinnitus, trouble swallowing and voice change.   Eyes: Negative.   Respiratory: Positive for cough and shortness of breath. Negative for chest tightness and wheezing.   Cardiovascular: Negative.   Gastrointestinal: Negative.   Musculoskeletal: Positive for myalgias.  Neurological: Negative.     Objective:  Physical Exam Constitutional:      Appearance: He is well-developed.  HENT:     Head: Normocephalic and atraumatic.     Comments: Oropharynx with redness and clear drainage, nose with swollen turbinates, TMs normal bilaterally.  Neck:     Musculoskeletal: Normal range of motion.     Thyroid: No thyromegaly.  Cardiovascular:     Rate and Rhythm: Normal rate and regular rhythm.  Pulmonary:     Effort: Pulmonary effort is normal. No respiratory distress.     Breath sounds: Normal breath sounds. No wheezing or rales.  Abdominal:     Palpations: Abdomen is soft.  Musculoskeletal:        General: Tenderness present.  Lymphadenopathy:     Cervical: No cervical adenopathy.  Skin:    General: Skin is warm and dry.  Neurological:     Mental Status: He is alert and oriented to person, place, and time.     Vitals:   07/17/18 1020  BP: (!) 160/84  Pulse:  77  Temp: 98 F (36.7 C)  TempSrc: Oral  SpO2: 95%  Weight: 254 lb (115.2 kg)  Height: 6\' 1"  (1.854 m)    Assessment & Plan:  Depo-medrol 40 mg IM given at visit

## 2018-07-17 NOTE — Assessment & Plan Note (Signed)
No antibiotics are indicated. Depo-medrol 40 mg IM given at visit. Rx for promethazine/dm cough syrup. Advised that this is viral and could be flu. Flu test not performed as outside window for tamiflu. Counseled to stop smoking.

## 2018-07-19 DIAGNOSIS — E1065 Type 1 diabetes mellitus with hyperglycemia: Secondary | ICD-10-CM | POA: Diagnosis not present

## 2018-07-20 ENCOUNTER — Other Ambulatory Visit: Payer: Self-pay

## 2018-07-20 ENCOUNTER — Encounter (HOSPITAL_BASED_OUTPATIENT_CLINIC_OR_DEPARTMENT_OTHER): Payer: Self-pay

## 2018-07-20 NOTE — Progress Notes (Signed)
[facility] DM coordinator notified of insulin pump, pump will remain in place. Dr. Buddy Duty is Mr. Dalzell' Endocrinologist.

## 2018-07-20 NOTE — Progress Notes (Signed)
Spoke with:  Leslee NPO:  After Midnight, no gum, candy, or mints  No smoking after midnight Arrival time:  0530AM Labs: Istat 8, EKG AM medications:  Zetia, Flonase, Tamsulosin, Skelaxin, Pitavastatin, Eye drops if needed, Per DM coordinator leave insulin pump on Pre op orders: Yes Ride home:  Kendrick Fries (wife) [place] DM coordinator notified of insulin pump, pump will remain in place.

## 2018-07-25 ENCOUNTER — Encounter: Payer: Self-pay | Admitting: Internal Medicine

## 2018-07-25 DIAGNOSIS — M5030 Other cervical disc degeneration, unspecified cervical region: Secondary | ICD-10-CM | POA: Diagnosis not present

## 2018-07-25 DIAGNOSIS — M4712 Other spondylosis with myelopathy, cervical region: Secondary | ICD-10-CM | POA: Diagnosis not present

## 2018-07-26 ENCOUNTER — Encounter: Payer: Self-pay | Admitting: Internal Medicine

## 2018-08-06 ENCOUNTER — Encounter: Payer: Self-pay | Admitting: Family

## 2018-08-07 ENCOUNTER — Ambulatory Visit (INDEPENDENT_AMBULATORY_CARE_PROVIDER_SITE_OTHER): Payer: Medicare Other | Admitting: Family

## 2018-08-07 ENCOUNTER — Encounter: Payer: Self-pay | Admitting: Family

## 2018-08-07 VITALS — BP 148/78 | HR 68 | Temp 98.2°F | Ht 73.0 in | Wt 253.4 lb

## 2018-08-07 DIAGNOSIS — J019 Acute sinusitis, unspecified: Secondary | ICD-10-CM

## 2018-08-07 DIAGNOSIS — I10 Essential (primary) hypertension: Secondary | ICD-10-CM | POA: Diagnosis not present

## 2018-08-07 MED ORDER — FLUTICASONE PROPIONATE 50 MCG/ACT NA SUSP: 2.0000 | Freq: Every day | NASAL | 3 refills | Status: DC

## 2018-08-07 MED ORDER — AMOXICILLIN-POT CLAVULANATE 875-125 MG PO TABS: 1.0000 | ORAL_TABLET | Freq: Two times a day (BID) | ORAL | 0 refills | Status: DC

## 2018-08-07 MED ORDER — RAMIPRIL 10 MG PO CAPS: 10.0000 mg | ORAL_CAPSULE | Freq: Two times a day (BID) | ORAL | 1 refills | Status: DC

## 2018-08-07 NOTE — Progress Notes (Signed)
Anthony Daniel. is a 68 y.o. male with the following history as recorded in EpicCare:  Patient Active Problem List   Diagnosis Date Noted  . Tobacco abuse 07/17/2018  . Essential hypertension 03/07/2018  . Viral URI with cough 09/26/2017  . Seasonal allergic rhinitis due to pollen 09/26/2017  . Spinal stenosis in cervical region 12/01/2015  . Sleep apnea in adult 06/24/2015  . DDD (degenerative disc disease), cervical 11/10/2014  . Background diabetic retinopathy ([place]) 08/05/2013  . Hyperlipidemia with target LDL less than 100 11/05/2012  . Routine general medical examination at a health care facility 06/26/2012  . LADA (latent autoimmune diabetes in adults), managed as type 1 ([place]) 06/26/2012    Current Outpatient Medications  Medication Sig Dispense Refill  . aspirin EC 81 MG tablet Take 81 mg by mouth daily.    . BD INSULIN SYRINGE U/F 30G X 1/2" 0.5 ML MISC     . Cholecalciferol (VITAMIN D) 2000 units tablet Take 2,000 Units by mouth daily.    . Ciclopirox 0.77 % gel APPLY TWICE A DAY TO AFFECTED AREA. (Patient taking differently: 2 (two) times daily as needed. Apply twice a day to affected area.) 30 g 2  . ezetimibe (ZETIA) 10 MG tablet TAKE 1 TABLET BY MOUTH EVERY DAY (Patient taking differently: every morning. TAKE 1 TABLET BY MOUTH EVERY DAY) 90 tablet 1  . Insulin Glargine (LANTUS SOLOSTAR [place]) Inject 50 Units into the skin 2 (two) times daily as needed. Only uses when pump goes out    . Insulin Human (INSULIN PUMP) SOLN Inject 1 each into the skin continuous. Novolog insulin    . levocetirizine (XYZAL) 5 MG tablet TAKE 1 TABLET BY MOUTH EVERY DAY IN THE EVENING 90 tablet 1  . meloxicam (MOBIC) 15 MG tablet TAKE 1 TABLET (15 MG TOTAL) BY MOUTH DAILY. 90 tablet 0  . metaxalone (SKELAXIN) 400 MG tablet TAKE 1 TABLET (400 MG TOTAL) BY MOUTH 3 (THREE) TIMES DAILY. 90 tablet 2  . NOVOLOG 100 UNIT/ML injection     . nystatin (MYCOSTATIN/NYSTOP) powder     . ONETOUCH VERIO test strip      . Pitavastatin Calcium (LIVALO) 2 MG TABS Take 1 tablet (2 mg total) by mouth daily. (Patient taking differently: Take 2 mg by mouth every morning. ) 90 tablet 1  . Polyethyl Glycol-Propyl Glycol (SYSTANE OP) Place 1 drop into both eyes every 8 (eight) hours as needed (dry eyes).    . Potassium Citrate 15 MEQ (1620 MG) TBCR Take 1 tablet by mouth 2 (two) times daily.    . sildenafil (REVATIO) 20 MG tablet Take 20 mg by mouth daily as needed (erectile dysfunction).    . tamsulosin (FLOMAX) 0.4 MG CAPS capsule Take 1 capsule (0.4 mg total) by mouth daily. (Patient taking differently: Take 0.4 mg by mouth every morning. ) 30 capsule 0  . amoxicillin-clavulanate (AUGMENTIN) 875-125 MG tablet Take 1 tablet by mouth 2 (two) times daily. 20 tablet 0  . fluticasone (FLONASE) 50 MCG/ACT nasal spray Place 2 sprays into both nostrils daily. 16 g 3  . HYDROcodone-acetaminophen (NORCO/VICODIN) 5-325 MG tablet     . ramipril (ALTACE) 10 MG capsule Take 1 capsule (10 mg total) by mouth 2 (two) times daily. 60 capsule 1   No current facility-administered medications for this visit.     Allergies: Crestor [rosuvastatin]; Lipitor [atorvastatin]; Contrast media [iodinated diagnostic agents]; Latex; Oxycodone; Adhesive [tape]; and Peanut-containing drug products  Past Medical History:  Diagnosis Date  .  Arthritis    Back  . History of bladder cancer    1998--  TCC  . History of kidney stones   . Hyperlipidemia   . Hypertension   . LADA (latent autoimmune diabetes in adults), managed as type 1 ([place])   . Nephrolithiasis    bilateral --  nonobstructive per CT  . Spinal stenosis, cervical region     Past Surgical History:  Procedure Laterality Date  . COLONOSCOPY  2018   x2  . CYSTOSCOPY W/ URETERAL STENT PLACEMENT Right 05/12/2015   Procedure: CYSTOSCOPY WITH STENT REPLACEMENT;  Surgeon: Cleon Gustin, MD;  Location: [facility];  Service: Urology;  Laterality: Right;  . CYSTOSCOPY  WITH URETEROSCOPY AND STENT PLACEMENT Right 04/23/2015   Procedure: CYSTOSCOPY WITH URETEROSCOPY RETROGRADE PYELOGRAM AND STENT PLACEMENT;  Surgeon: Cleon Gustin, MD;  Location: [facility];  Service: Urology;  Laterality: Right;  . CYSTOSCOPY/RETROGRADE/URETEROSCOPY/STONE EXTRACTION WITH BASKET Right 05/12/2015   Procedure: CYSTOSCOPY/RETROGRADE/URETEROSCOPY/STONE EXTRACTION WITH BASKET;  Surgeon: Cleon Gustin, MD;  Location: [facility];  Service: Urology;  Laterality: Right;  . DENTAL SURGERY    . EXTRACORPOREAL SHOCK WAVE LITHOTRIPSY  x2 in  2006//   x2  in 2012  . HOLMIUM LASER APPLICATION Right 24/03/7352   Procedure: HOLMIUM LASER APPLICATION;  Surgeon: Cleon Gustin, MD;  Location: [facility];  Service: Urology;  Laterality: Right;  . LAPAROSCOPIC APPENDECTOMY N/A 11/07/2012   Procedure: APPENDECTOMY LAPAROSCOPIC;  Surgeon: Harl Bowie, MD;  Location: [place];  Service: General;  Laterality: N/A;  . NEPHROLITHOTOMY Left 08/06/2015   Procedure: 1ST STAGE  LEFT PERCUTANEOUS NEPHROLITHOTOMY ;  Surgeon: Irine Seal, MD;  Location: [facility];  Service: Urology;  Laterality: Left;  . TONSILLECTOMY  1975  . TRANSURETHRAL RESECTION OF BLADDER TUMOR  1998    Family History  Problem Relation Age of Onset  . Cancer Other        Colon and Prostate Cancer  . Hypertension Other   . Diabetes Other   . Alcohol abuse Neg Hx   . Drug abuse Neg Hx   . Early death Neg Hx   . Heart disease Neg Hx   . Hyperlipidemia Neg Hx   . Kidney disease Neg Hx   . Stroke Neg Hx     Social History   Tobacco Use  . Smoking status: Current Every Day Smoker    Packs/day: 0.50    Years: 30.00    Pack years: 15.00    Types: Cigarettes  . Smokeless tobacco: Never Used  Substance Use Topics  . Alcohol use: Yes    Alcohol/week: 5.0 standard drinks    Types: 5 Shots of liquor per week    Comment: occasional    Subjective:  3 week history of congestion/ right ear  pain; was seen at the end of December- thought to be viral in nature; occasional wheeze but no chest pain or shortness of breath; notes that right ear has become painful in the past week; no fever;   Also concerned that his blood pressure is not well controlled; Denies any chest pain, shortness of breath, blurred vision or headache. Currently takes 10 mg of Altace daily; would prefer not to add another medication if possible.    Objective:  Vitals:   08/07/18 1140  BP: (!) 148/78  Pulse: 68  Temp: 98.2 F (36.8 C)  TempSrc: Oral  SpO2: 97%  Weight: 253 lb 6.4 oz (114.9 kg)  Height: 6'  1" (1.854 m)    General: Well developed, well nourished, in no acute distress  Skin : Warm and dry.  Head: Normocephalic and atraumatic  Eyes: Sclera and conjunctiva clear; pupils round and reactive to light; extraocular movements intact  Ears: External normal; canals clear; tympanic membranes erythematous Oropharynx: Pink, supple. No suspicious lesions  Neck: Supple without thyromegaly, adenopathy  Lungs: Respirations unlabored; clear to auscultation bilaterally without wheeze, rales, rhonchi  CVS exam: normal rate and regular rhythm.  Neurologic: Alert and oriented; speech intact; face symmetrical; moves all extremities well; CNII-XII intact without focal deficit   Assessment:  1. Acute sinusitis, recurrence not specified, unspecified location   2. Essential hypertension     Plan:  1. Rx for Augmentin 875 mg bid x 10 days, Flonase NS; increase fluids, rest and follow-up worse, no better. 2. Uncontrolled; try increase Altace to 10 mg bid; see PCP in 1 month for re-check/ follow-up.    Return in about 1 month (around 09/07/2018) for Dr. Ronnald Ramp for blood pressure follow-up.  No orders of the defined types were placed in this encounter.   Requested Prescriptions   Signed Prescriptions Disp Refills  . ramipril (ALTACE) 10 MG capsule 60 capsule 1    Sig: Take 1 capsule (10 mg total) by mouth 2 (two)  times daily.  . fluticasone (FLONASE) 50 MCG/ACT nasal spray 16 g 3    Sig: Place 2 sprays into both nostrils daily.  Marland Kitchen amoxicillin-clavulanate (AUGMENTIN) 875-125 MG tablet 20 tablet 0    Sig: Take 1 tablet by mouth 2 (two) times daily.

## 2018-08-10 DIAGNOSIS — M4722 Other spondylosis with radiculopathy, cervical region: Secondary | ICD-10-CM | POA: Diagnosis not present

## 2018-08-10 DIAGNOSIS — M5412 Radiculopathy, cervical region: Secondary | ICD-10-CM | POA: Diagnosis not present

## 2018-08-10 DIAGNOSIS — Z6833 Body mass index (BMI) 33.0-33.9, adult: Secondary | ICD-10-CM | POA: Diagnosis not present

## 2018-08-10 DIAGNOSIS — I10 Essential (primary) hypertension: Secondary | ICD-10-CM | POA: Diagnosis not present

## 2018-08-10 DIAGNOSIS — M5020 Other cervical disc displacement, unspecified cervical region: Secondary | ICD-10-CM | POA: Diagnosis not present

## 2018-08-10 DIAGNOSIS — M5030 Other cervical disc degeneration, unspecified cervical region: Secondary | ICD-10-CM | POA: Diagnosis not present

## 2018-08-10 DIAGNOSIS — M542 Cervicalgia: Secondary | ICD-10-CM | POA: Diagnosis not present

## 2018-08-13 ENCOUNTER — Other Ambulatory Visit: Payer: Self-pay | Admitting: Internal Medicine

## 2018-08-13 DIAGNOSIS — M5030 Other cervical disc degeneration, unspecified cervical region: Secondary | ICD-10-CM

## 2018-08-15 DIAGNOSIS — E113311 Type 2 diabetes mellitus with moderate nonproliferative diabetic retinopathy with macular edema, right eye: Secondary | ICD-10-CM | POA: Diagnosis not present

## 2018-08-22 DIAGNOSIS — C61 Malignant neoplasm of prostate: Secondary | ICD-10-CM | POA: Diagnosis not present

## 2018-08-22 DIAGNOSIS — D075 Carcinoma in situ of prostate: Secondary | ICD-10-CM | POA: Diagnosis not present

## 2018-08-22 DIAGNOSIS — R972 Elevated prostate specific antigen [PSA]: Secondary | ICD-10-CM | POA: Diagnosis not present

## 2018-09-06 ENCOUNTER — Ambulatory Visit (INDEPENDENT_AMBULATORY_CARE_PROVIDER_SITE_OTHER): Payer: Medicare Other | Admitting: Internal Medicine

## 2018-09-06 ENCOUNTER — Ambulatory Visit (INDEPENDENT_AMBULATORY_CARE_PROVIDER_SITE_OTHER)
Admission: RE | Admit: 2018-09-06 | Discharge: 2018-09-06 | Disposition: A | Discharge status: Home / Self Care | Payer: Medicare Other | Source: Ambulatory Visit | Attending: Internal Medicine | Admitting: Internal Medicine

## 2018-09-06 ENCOUNTER — Encounter: Payer: Self-pay | Admitting: Internal Medicine

## 2018-09-06 ENCOUNTER — Other Ambulatory Visit (INDEPENDENT_AMBULATORY_CARE_PROVIDER_SITE_OTHER): Payer: Medicare Other

## 2018-09-06 DIAGNOSIS — E785 Hyperlipidemia, unspecified: Secondary | ICD-10-CM

## 2018-09-06 DIAGNOSIS — E139 Other specified diabetes mellitus without complications: Secondary | ICD-10-CM

## 2018-09-06 DIAGNOSIS — C61 Malignant neoplasm of prostate: Secondary | ICD-10-CM

## 2018-09-06 DIAGNOSIS — R05 Cough: Secondary | ICD-10-CM | POA: Diagnosis not present

## 2018-09-06 DIAGNOSIS — T464X5A Adverse effect of angiotensin-converting-enzyme inhibitors, initial encounter: Secondary | ICD-10-CM | POA: Diagnosis not present

## 2018-09-06 DIAGNOSIS — R058 Other specified cough: Secondary | ICD-10-CM | POA: Insufficient documentation

## 2018-09-06 DIAGNOSIS — I10 Essential (primary) hypertension: Secondary | ICD-10-CM

## 2018-09-06 LAB — CBC WITH DIFFERENTIAL/PLATELET
Basophils Absolute: 0.1 10*3/uL (ref 0.0–0.1)
Basophils Relative: 0.8 % (ref 0.0–3.0)
Eosinophils Absolute: 1.2 10*3/uL — ABNORMAL HIGH (ref 0.0–0.7)
Eosinophils Relative: 10.4 % — ABNORMAL HIGH (ref 0.0–5.0)
HCT: 46.4 % (ref 39.0–52.0)
Hemoglobin: 15.3 g/dL (ref 13.0–17.0)
Lymphocytes Relative: 35.2 % (ref 12.0–46.0)
Lymphs Abs: 4.1 10*3/uL — ABNORMAL HIGH (ref 0.7–4.0)
MCHC: 33 g/dL (ref 30.0–36.0)
MCV: 90.6 fl (ref 78.0–100.0)
Monocytes Absolute: 0.7 10*3/uL (ref 0.1–1.0)
Monocytes Relative: 6.1 % (ref 3.0–12.0)
Neutro Abs: 5.5 10*3/uL (ref 1.4–7.7)
Neutrophils Relative %: 47.5 % (ref 43.0–77.0)
Platelets: 164 10*3/uL (ref 150.0–400.0)
RBC: 5.12 Mil/uL (ref 4.22–5.81)
RDW: 15.2 % (ref 11.5–15.5)
WBC: 11.6 10*3/uL — ABNORMAL HIGH (ref 4.0–10.5)

## 2018-09-06 LAB — BASIC METABOLIC PANEL
BUN: 16 mg/dL (ref 6–23)
CO2: 26 mEq/L (ref 19–32)
Calcium: 9 mg/dL (ref 8.4–10.5)
Chloride: 106 mEq/L (ref 96–112)
Creatinine, Ser: 1.02 mg/dL (ref 0.40–1.50)
GFR: 88.42 mL/min (ref >60.00)
Glucose, Bld: 94 mg/dL (ref 70–99)
Potassium: 4.2 mEq/L (ref 3.5–5.1)
Sodium: 140 mEq/L (ref 135–145)

## 2018-09-06 LAB — LIPID PANEL
Cholesterol: 83 mg/dL (ref 0–200)
HDL: 37.1 mg/dL — ABNORMAL LOW (ref >39.00)
LDL Cholesterol: 35 mg/dL (ref 0–99)
NonHDL: 45.5
Total CHOL/HDL Ratio: 2
Triglycerides: 53 mg/dL (ref 0.0–149.0)
VLDL: 10.6 mg/dL (ref 0.0–40.0)

## 2018-09-06 LAB — HEMOGLOBIN A1C: Hgb A1c MFr Bld: 7.4 % — ABNORMAL HIGH (ref 4.6–6.5)

## 2018-09-06 LAB — TSH: TSH: 0.9 u[IU]/mL (ref 0.35–4.50)

## 2018-09-06 MED ORDER — CANDESARTAN CILEXETIL-HCTZ 32-12.5 MG PO TABS: 1.0000 | ORAL_TABLET | Freq: Every day | ORAL | 1 refills | Status: DC

## 2018-09-06 NOTE — Progress Notes (Signed)
Subjective:  [name]., male    DOB: 10/25/1950  Age: 68 y.o. MRN: [id number]  CC: Hypertension and Cough   HPI Jani Ploeger. presents for f/up - He complains of an intermittent nonproductive cough for about 5 weeks.  He also complains that his blood pressure has not been well controlled.  He underwent a prostate biopsy about 2 weeks ago and was found to have low-grade prostate cancer.  He is in the process of considering treatment options.  Outpatient Medications Prior to Visit  Medication Sig Dispense Refill  . aspirin EC 81 MG tablet Take 81 mg by mouth daily.    . BD INSULIN SYRINGE U/F 30G X 1/2" 0.5 ML MISC     . Cholecalciferol (VITAMIN D) 2000 units tablet Take 2,000 Units by mouth daily.    . Ciclopirox 0.77 % gel APPLY TWICE A DAY TO AFFECTED AREA. (Patient taking differently: 2 (two) times daily as needed. Apply twice a day to affected area.) 30 g 2  . ezetimibe (ZETIA) 10 MG tablet TAKE 1 TABLET BY MOUTH EVERY DAY (Patient taking differently: every morning. TAKE 1 TABLET BY MOUTH EVERY DAY) 90 tablet 1  . fluticasone (FLONASE) 50 MCG/ACT nasal spray Place 2 sprays into both nostrils daily. 16 g 3  . HYDROcodone-acetaminophen (NORCO/VICODIN) 5-325 MG tablet     . Insulin Glargine (LANTUS SOLOSTAR ) Inject 50 Units into the skin 2 (two) times daily as needed. Only uses when pump goes out    . Insulin Human (INSULIN PUMP) SOLN Inject 1 each into the skin continuous. Novolog insulin    . levocetirizine (XYZAL) 5 MG tablet TAKE 1 TABLET BY MOUTH EVERY DAY IN THE EVENING 90 tablet 1  . meloxicam (MOBIC) 15 MG tablet TAKE 1 TABLET (15 MG TOTAL) BY MOUTH DAILY. 90 tablet 0  . metaxalone (SKELAXIN) 400 MG tablet TAKE 1 TABLET (400 MG TOTAL) BY MOUTH 3 (THREE) TIMES DAILY. 90 tablet 2  . NOVOLOG 100 UNIT/ML injection     . nystatin (MYCOSTATIN/NYSTOP) powder     . ONETOUCH VERIO test strip     . Pitavastatin Calcium (LIVALO) 2 MG TABS Take 1 tablet (2 mg total)  by mouth daily. (Patient taking differently: Take 2 mg by mouth every morning. ) 90 tablet 1  . Polyethyl Glycol-Propyl Glycol (SYSTANE OP) Place 1 drop into both eyes every 8 (eight) hours as needed (dry eyes).    . Potassium Citrate 15 MEQ (1620 MG) TBCR Take 1 tablet by mouth 2 (two) times daily.    . sildenafil (REVATIO) 20 MG tablet Take 20 mg by mouth daily as needed (erectile dysfunction).    . tamsulosin (FLOMAX) 0.4 MG CAPS capsule Take 1 capsule (0.4 mg total) by mouth daily. (Patient taking differently: Take 0.4 mg by mouth every morning. ) 30 capsule 0  . amoxicillin-clavulanate (AUGMENTIN) 875-125 MG tablet Take 1 tablet by mouth 2 (two) times daily. 20 tablet 0  . ramipril (ALTACE) 10 MG capsule Take 1 capsule (10 mg total) by mouth 2 (two) times daily. 60 capsule 1   No facility-administered medications prior to visit.     ROS Review of Systems  Constitutional: Negative.  Negative for chills, diaphoresis, fatigue and fever.  HENT: Negative.  Negative for trouble swallowing.   Eyes: Negative for visual disturbance.  Respiratory: Positive for cough. Negative for chest tightness, shortness of breath and stridor.   Cardiovascular: Negative for chest pain, palpitations and leg swelling.  Gastrointestinal:  Negative for abdominal pain, constipation, diarrhea, nausea and vomiting.  Endocrine: Negative for polydipsia, polyphagia and polyuria.  Genitourinary: Negative.  Negative for difficulty urinating and dysuria.  Musculoskeletal: Negative.  Negative for arthralgias, myalgias and neck pain.  Skin: Negative.  Negative for color change, pallor and rash.  Neurological: Negative.  Negative for dizziness, weakness, light-headedness and headaches.  Hematological: Negative for adenopathy. Does not bruise/bleed easily.  Psychiatric/Behavioral: Negative.     Objective:  BP (!) 156/92 (BP Location: Left Arm, Patient Position: Sitting, Cuff Size: Large)   Pulse 76   Temp 98.5 F (36.9 C)  (Oral)   Resp 16   Ht 6\' 1"  (1.854 m)   Wt 251 lb 12 oz (114.2 kg)   SpO2 96%   BMI 33.21 kg/m   BP Readings from Last 3 Encounters:  09/06/18 (!) 156/92  08/07/18 (!) 148/78  07/17/18 (!) 160/84    Wt Readings from Last 3 Encounters:  09/06/18 251 lb 12 oz (114.2 kg)  08/07/18 253 lb 6.4 oz (114.9 kg)  07/17/18 254 lb (115.2 kg)    Physical Exam Vitals signs reviewed.  Constitutional:      Appearance: He is not ill-appearing or diaphoretic.  HENT:     Nose: Nose normal. No congestion or rhinorrhea.     Mouth/Throat:     Pharynx: Oropharynx is clear. No oropharyngeal exudate or posterior oropharyngeal erythema.  Eyes:     General: No scleral icterus.    Conjunctiva/sclera: Conjunctivae normal.  Neck:     Musculoskeletal: Normal range of motion and neck supple. No muscular tenderness.  Cardiovascular:     Rate and Rhythm: Normal rate and regular rhythm.     Pulses: Normal pulses.     Heart sounds: No murmur. No gallop.   Pulmonary:     Effort: Pulmonary effort is normal. No respiratory distress.     Breath sounds: No stridor. No wheezing, rhonchi or rales.  Abdominal:     General: Abdomen is flat. Bowel sounds are normal.     Palpations: There is no hepatomegaly, splenomegaly or mass.     Tenderness: There is no abdominal tenderness. There is no guarding.  Musculoskeletal: Normal range of motion.        General: No swelling.     Right lower leg: No edema.     Left lower leg: No edema.  Lymphadenopathy:     Cervical: No cervical adenopathy.  Skin:    General: Skin is warm and dry.     Coloration: Skin is not pale.  Neurological:     General: No focal deficit present.     Mental Status: He is oriented to person, place, and time. Mental status is at baseline.     Lab Results  Component Value Date   WBC 11.6 (H) 09/06/2018   HGB 15.3 09/06/2018   HCT 46.4 09/06/2018   PLT 164.0 09/06/2018   GLUCOSE 94 09/06/2018   CHOL 83 09/06/2018   TRIG 53.0 09/06/2018    HDL 37.10 (L) 09/06/2018   LDLCALC 35 09/06/2018   ALT 18 08/23/2017   AST 13 (A) 08/23/2017   NA 140 09/06/2018   K 4.2 09/06/2018   CL 106 09/06/2018   CREATININE 1.02 09/06/2018   BUN 16 09/06/2018   CO2 26 09/06/2018   TSH 0.90 09/06/2018   PSA 4.14 06/22/2018   INR 1.02 08/06/2015   HGBA1C 7.4 (H) 09/06/2018   MICROALBUR 4.46 01/30/2017    Dg Lumbar Spine Complete  Result Date:  05/09/2018 CLINICAL DATA:  Chronic low back pain for 4 months.  No injury EXAM: LUMBAR SPINE - COMPLETE 4+ VIEW COMPARISON:  CT, 01/01/2018. FINDINGS: No fracture bone lesion or spondylolisthesis. Mild loss of disc height at L4-L5. Remaining lumbar discs are well preserved in height. Facet joints are well preserved. Bilateral intrarenal stones stable compared to the prior CT. IMPRESSION: 1. No fracture, spondylolisthesis or acute finding. 2. Mild disc degenerative change at L4-L5. 3. Bilateral intrarenal stones. Electronically Signed   By: Lajean Manes M.D.   On: 05/09/2018 15:08    Assessment & Plan:   Marcellous was seen today for hypertension and cough.  Diagnoses and all orders for this visit:  Essential hypertension- His blood pressure is not adequately well controlled and he has developed a cough on the ACE inhibitor.  I have asked him to stop taking ramipril and will upgrade him to an ARB plus a thiazide diuretic. -     candesartan-hydrochlorothiazide (ATACAND HCT) 32-12.5 MG tablet; Take 1 tablet by mouth daily. -     CBC with Differential/Platelet; Future -     Basic metabolic panel; Future  Hyperlipidemia with target LDL less than 100- He has achieved his LDL goal and is doing well on the statin. -     Lipid panel; Future -     TSH; Future  Prostate cancer [facility])- He will be deciding on treatment options soon.  Cough due to ACE inhibitor- His chest x-ray is negative for mass or infiltrate.  Will stop taking the ACE inhibitor. -     DG Chest 2 View; Future  LADA (latent autoimmune  diabetes in adults), managed as type 1 ([place])- His is at 7.4%.  His blood sugars are adequately well controlled. -     candesartan-hydrochlorothiazide (ATACAND HCT) 32-12.5 MG tablet; Take 1 tablet by mouth daily. -     Basic metabolic panel; Future -     Hemoglobin A1c; Future -     Ambulatory referral to Ophthalmology   I have discontinued Anthony Daniel. "Tommy"'s ramipril and amoxicillin-clavulanate. I am also having him start on candesartan-hydrochlorothiazide. Additionally, I am having him maintain his Insulin Glargine (LANTUS SOLOSTAR [place]), Potassium Citrate, aspirin EC, tamsulosin, Polyethyl Glycol-Propyl Glycol (SYSTANE OP), insulin pump, sildenafil, Vitamin D, NOVOLOG, Pitavastatin Calcium, levocetirizine, ezetimibe, metaxalone, Ciclopirox, ONETOUCH VERIO, HYDROcodone-acetaminophen, BD INSULIN SYRINGE U/F, nystatin, fluticasone, and meloxicam.  Meds ordered this encounter  Medications  . candesartan-hydrochlorothiazide (ATACAND HCT) 32-12.5 MG tablet    Sig: Take 1 tablet by mouth daily.    Dispense:  90 tablet    Refill:  1     Follow-up: Return in about 3 months (around 12/05/2018).  Scarlette Calico, MD

## 2018-09-06 NOTE — Patient Instructions (Signed)

## 2018-09-11 DIAGNOSIS — N200 Calculus of kidney: Secondary | ICD-10-CM | POA: Diagnosis not present

## 2018-09-12 DIAGNOSIS — E1022 Type 1 diabetes mellitus with diabetic chronic kidney disease: Secondary | ICD-10-CM | POA: Diagnosis not present

## 2018-09-12 DIAGNOSIS — E785 Hyperlipidemia, unspecified: Secondary | ICD-10-CM | POA: Diagnosis not present

## 2018-09-12 DIAGNOSIS — Z794 Long term (current) use of insulin: Secondary | ICD-10-CM | POA: Diagnosis not present

## 2018-09-12 DIAGNOSIS — E1165 Type 2 diabetes mellitus with hyperglycemia: Secondary | ICD-10-CM | POA: Diagnosis not present

## 2018-09-12 DIAGNOSIS — N181 Chronic kidney disease, stage 1: Secondary | ICD-10-CM | POA: Diagnosis not present

## 2018-09-13 DIAGNOSIS — C61 Malignant neoplasm of prostate: Secondary | ICD-10-CM | POA: Diagnosis not present

## 2018-09-13 DIAGNOSIS — N401 Enlarged prostate with lower urinary tract symptoms: Secondary | ICD-10-CM | POA: Diagnosis not present

## 2018-09-13 DIAGNOSIS — N5201 Erectile dysfunction due to arterial insufficiency: Secondary | ICD-10-CM | POA: Diagnosis not present

## 2018-09-13 DIAGNOSIS — R3912 Poor urinary stream: Secondary | ICD-10-CM | POA: Diagnosis not present

## 2018-09-14 ENCOUNTER — Encounter (HOSPITAL_BASED_OUTPATIENT_CLINIC_OR_DEPARTMENT_OTHER): Payer: Self-pay | Admitting: *Deleted

## 2018-09-14 ENCOUNTER — Other Ambulatory Visit: Payer: Self-pay | Admitting: Internal Medicine

## 2018-09-14 ENCOUNTER — Other Ambulatory Visit: Payer: Self-pay

## 2018-09-14 DIAGNOSIS — J301 Allergic rhinitis due to pollen: Secondary | ICD-10-CM

## 2018-09-14 NOTE — Progress Notes (Addendum)
Spoke w/ pt via phone for pre-op interview.  Npo after mn.  Arrive at 0530.  Current lab results dated 09-06-2018 in chat and epic (cbc, cmp, a1c).  Current ekg in chart and epic.  Will take zetia, pitavastatin, flomax am dos w/ sips of water and if needed take skelaxin, hydrocodone.  Pt has insulin pump. His endocrinologist is dr Buddy Duty, [place] Clement note with chart. Pt to leave insulin pump on.

## 2018-09-17 NOTE — H&P (Signed)
CC/HPI: I have prostate cancer.     Anthony Daniel returns today to discuss his recent prostate biopsy from 08/22/18.   PSA 4.14   Prostate Volume 44ml.   Path: T1c Nx Mx Gleason 6(3+3) cancer with a single core at the right medial apex with 5% involvement. Atypia in 1 core on the left and 1 core on the right and HGPIN in 1 core at the base.   [facility] nomogram predicts 70% probability of OCD, 30% ECE, 1% LNI and 1% SVI.   IPSS 17.   SHIM 15.   MRIP in 2018 was negative.   Prostate biopsy in 2015 was negative.    ALLERGIES: Contrast Media Ready-Box MISC - Nausea Iodine - Nausea Latex Gloves MISC - Skin Rash Livalo TABS - cramping oxycodone - Nausea rosuvastatin - cramping Statins     MEDICATIONS: Tamsulosin Hcl 0.4 mg capsule 1 capsule PO Daily  Urocit-K 15 meq (1,620 mg) tablet, extended release 1 tablet PO BID  Candesartan-Hydrochlorothiazid 32 mg-12.5 mg tablet 1 tablet PO Daily  Ecotrin 325 mg tablet, delayed release Oral  Fluticasone Propionate 50 mcg/actuation spray, suspension Nasal  HumaLOG 100 UNIT/ML Subcutaneous Solution Subcutaneous  Sildenafil Citrate 20 mg tablet 1-5 tablets as needed  Systane Ultra SOLN Ophthalmic  Zetia 10 mg tablet Oral     GU PSH: ESWL - 2012, 2012, 2008 Percut Stone Removal >2cm - 2017 Prostate Needle Biopsy - 08/22/2018 Ureteroscopic laser litho - 2016      PSH Notes: Dental Surgery, Percutaneous Lithotomy For Stone Over 2cm., Cystoscopy Ureteroscopy Lithotripsy Incl Insert Indwelling Ureter Stent, Preventive medication therapy needed, Lithotripsy, Lithotripsy, Lithotripsy, Cystoscopy Bladder Tumor   NON-GU PSH: Surgical Pathology, Gross And Microscopic Examination For Prostate Needle - 08/22/2018    GU PMH: Elevated PSA - 08/22/2018, His PSA continues to rise slowly. I will repeat this again in 3-4 months and if it continues to rise we will need to consider a repeat biopsy., - 06/27/2018, His PSA is down a bit. I will repeat in 6 months. , -  12/27/2017, His PSA is minimally changed. I will have him return in 3 months with another PSA with a week of abstinence since he didn't do that this time. , - 09/20/2017, His PSA is up a bit more. THE MRIP was negative earlier this year and he had a negative biopsy in 2015. I will repeat a PSA in 3 months with a week of abstinence. , - 06/19/2017 (Worsening), His PSA is up again with a low f/t ratio. I am going to get an MRI and will do a fusion biopsy if positive. If negative, he will return in 6 months with a PSA. , - 12/14/2016 (Stable), His PSA has come down from the 4 level. His PSADT is about 4 years. , - 06/13/2016, Elevated prostate specific antigen (PSA), - 2017 BPH w/LUTS, Benign prostatic hyperplasia with urinary obstruction - 2017 ED due to arterial insufficiency, Erectile dysfunction due to arterial insufficiency - 2017 Renal calculus, Nephrolithiasis - 2017, Kidney stone on left side, - 2017, Kidney stone on right side, - 2016 Weak Urinary Stream, Weak urinary stream - 2017 Other microscopic hematuria, Microscopic hematuria - 2016 Microscopic hematuria, Asymptomatic microscopic hematuria - 2016 History of bladder cancer, Bladder Cancer - 2014      PMH Notes:  2008-06-06 12:27:08 - Note: Nephrolithiasis Of The Left Kidney   NON-GU PMH: Tinea cruris, HE has a new complaint of a groin rash that is probably candidal with his history of diabetes.  Nystop sent to the pharmacy. - 06/19/2017 Encounter for general adult medical examination without abnormal findings, Encounter for preventive health examination - 2017 Personal history of other diseases of the circulatory system, History of hypertension - 2014 Personal history of other endocrine, nutritional and metabolic disease, History of type 2 diabetes mellitus - 2014, History of hypercholesterolemia, - 2014    FAMILY HISTORY: Atrial Fibrillation - Father Cardiac Failure - Father Family Health Status Number - Runs In Family    Notes: CaP father     SOCIAL HISTORY: Marital Status: Married Preferred Language: English; Race: Black or African American Current Smoking Status: Patient smokes. Has smoked since 03/18/1986. Smokes 1/2 pack per day.  Does not use smokeless tobacco. Social Drinker.  Does not use drugs. Drinks 2 caffeinated drinks per day. Has not had a blood transfusion. Patient's occupation is/was Retired.     Notes: ETOH maybe once per week    REVIEW OF SYSTEMS:    GU Review Male:   Patient denies stream starts and stops, have to strain to urinate , hard to postpone urination, get up at night to urinate, trouble starting your stream, burning/ pain with urination, leakage of urine, erection problems, penile pain, and frequent urination.  Gastrointestinal (Upper):   Patient denies nausea, vomiting, and indigestion/ heartburn.  Gastrointestinal (Lower):   Patient denies diarrhea and constipation.  Constitutional:   Patient denies fever, night sweats, weight loss, and fatigue.  Skin:   Patient denies skin rash/ lesion and itching.  Eyes:   Patient denies blurred vision and double vision.  Ears/ Nose/ Throat:   Patient denies sore throat and sinus problems.  Hematologic/Lymphatic:   Patient denies swollen glands and easy bruising.  Cardiovascular:   Patient denies leg swelling and chest pains.  Respiratory:   Patient denies cough and shortness of breath.  Endocrine:   Patient denies excessive thirst.  Musculoskeletal:   Patient denies back pain and joint pain.  Neurological:   Patient denies headaches and dizziness.  Psychologic:   Patient denies depression and anxiety.   VITAL SIGNS: None   PAST DATA REVIEWED:  Source Of History:  Patient  Records Review:   AUA Symptom Score, Pathology Reports   06/22/18 12/21/17 09/15/17 06/12/17 12/05/16 09/20/16 06/07/16 03/30/16  PSA  Total PSA 4.14 ng/mL 3.63 ng/mL 3.82 ng/mL 3.78 ng/mL 3.38 ng/dl 3.08 ng/dl 3.12 ng/dl 2.82 ng/dl  Free PSA 0.32 ng/mL      0.23 ng/dl 0.21 ng/dl   % Free PSA 8 % PSA      7 % 7 %    08/23/06 09/09/04  Hormones  Testosterone, Total 3.65  4.19     PROCEDURES: None   ASSESSMENT:      ICD-10 Details  1 GU:   Prostate Cancer - C61 He has very low risk, Gleason 6(3+3) T1c Nx Mx prostate cancer with BPH with moderate LUTS and ED. I discussed Active surveillance, RALP, EXRT and Seeds and briefly mentioned cryotherapy and HIFU. He is an excellent candidate for active surveillance and would like to pursue that option for the time being.   2   Elevated PSA - R97.20   3   BPH w/LUTS - N40.1 He has moderate LUTS on tamsulosin. I discussed the addition of finasteride which might have an impact on the prostate cancer as well as the LUTS, but he is not interested in pursuing that option at this time.   4   Weak Urinary Stream - R39.12   5   ED  due to arterial insufficiency - N52.01 He has failed to improve with LI-EST.      PLAN:            Medications Stop Meds: Levaquin 750 mg tablet 1 tablet PO morning of procedure  Start: 08/21/2018  Discontinue: 09/13/2018  - Reason: The medication cycle was completed.            Schedule Labs: 3 Months - PSA  Return Visit/Planned Activity: 3 Months - Office Visit          Document Letter(s):  Created for Patient: Clinical Summary         Notes:   CC: Dr. Scarlette Calico and Mr. Tompson.         Next Appointment:      Next Appointment: 09/18/2018 07:30 AM    Appointment Type: Surgery     Location: [facility] [phone]    Provider: Irine Seal, M.D.    Reason for Visit: OP NE CYSTO LT RGP LT URS LASER STENT      E & M CODE: I spent at least 30 minutes face to face with the patient, more than 50% of that time was spent on counseling and/or coordinating care.

## 2018-09-18 ENCOUNTER — Ambulatory Visit (HOSPITAL_BASED_OUTPATIENT_CLINIC_OR_DEPARTMENT_OTHER): Payer: Medicare Other | Admitting: Anesthesiology

## 2018-09-18 ENCOUNTER — Encounter (HOSPITAL_BASED_OUTPATIENT_CLINIC_OR_DEPARTMENT_OTHER): Payer: Self-pay | Admitting: Emergency Medicine

## 2018-09-18 ENCOUNTER — Encounter (HOSPITAL_BASED_OUTPATIENT_CLINIC_OR_DEPARTMENT_OTHER)
Admission: RE | Disposition: A | Discharge status: Home / Self Care | Payer: Self-pay | Source: Home / Self Care | Attending: Urology

## 2018-09-18 ENCOUNTER — Ambulatory Visit (HOSPITAL_BASED_OUTPATIENT_CLINIC_OR_DEPARTMENT_OTHER)
Admission: RE | Admit: 2018-09-18 | Discharge: 2018-09-18 | Disposition: A | Discharge status: Home / Self Care | Payer: Medicare Other | Attending: Urology | Admitting: Urology

## 2018-09-18 ENCOUNTER — Other Ambulatory Visit: Payer: Self-pay

## 2018-09-18 DIAGNOSIS — F17200 Nicotine dependence, unspecified, uncomplicated: Secondary | ICD-10-CM | POA: Insufficient documentation

## 2018-09-18 DIAGNOSIS — Z888 Allergy status to other drugs, medicaments and biological substances status: Secondary | ICD-10-CM | POA: Diagnosis not present

## 2018-09-18 DIAGNOSIS — I771 Stricture of artery: Secondary | ICD-10-CM | POA: Insufficient documentation

## 2018-09-18 DIAGNOSIS — E119 Type 2 diabetes mellitus without complications: Secondary | ICD-10-CM | POA: Diagnosis not present

## 2018-09-18 DIAGNOSIS — C61 Malignant neoplasm of prostate: Secondary | ICD-10-CM | POA: Diagnosis not present

## 2018-09-18 DIAGNOSIS — Z8249 Family history of ischemic heart disease and other diseases of the circulatory system: Secondary | ICD-10-CM | POA: Insufficient documentation

## 2018-09-18 DIAGNOSIS — N400 Enlarged prostate without lower urinary tract symptoms: Secondary | ICD-10-CM | POA: Diagnosis not present

## 2018-09-18 DIAGNOSIS — Z91041 Radiographic dye allergy status: Secondary | ICD-10-CM | POA: Diagnosis not present

## 2018-09-18 DIAGNOSIS — Z794 Long term (current) use of insulin: Secondary | ICD-10-CM | POA: Diagnosis not present

## 2018-09-18 DIAGNOSIS — Z885 Allergy status to narcotic agent status: Secondary | ICD-10-CM | POA: Insufficient documentation

## 2018-09-18 DIAGNOSIS — Z7982 Long term (current) use of aspirin: Secondary | ICD-10-CM | POA: Insufficient documentation

## 2018-09-18 DIAGNOSIS — N200 Calculus of kidney: Secondary | ICD-10-CM | POA: Diagnosis not present

## 2018-09-18 DIAGNOSIS — I10 Essential (primary) hypertension: Secondary | ICD-10-CM | POA: Diagnosis not present

## 2018-09-18 DIAGNOSIS — Z87442 Personal history of urinary calculi: Secondary | ICD-10-CM | POA: Insufficient documentation

## 2018-09-18 DIAGNOSIS — Z91040 Latex allergy status: Secondary | ICD-10-CM | POA: Insufficient documentation

## 2018-09-18 HISTORY — DX: Unspecified osteoarthritis, unspecified site: M19.90

## 2018-09-18 HISTORY — DX: Presence of insulin pump (external) (internal): Z96.41

## 2018-09-18 HISTORY — PX: CYSTOSCOPY/URETEROSCOPY/HOLMIUM LASER/STENT PLACEMENT: SHX6546

## 2018-09-18 HISTORY — DX: Spinal stenosis, cervical region: M48.02

## 2018-09-18 HISTORY — DX: Chronic kidney disease, stage 1: N18.1

## 2018-09-18 LAB — GLUCOSE, CAPILLARY: Glucose-Capillary: 145 mg/dL — ABNORMAL HIGH (ref 70–99)

## 2018-09-18 SURGERY — CYSTOSCOPY/URETEROSCOPY/HOLMIUM LASER/STENT PLACEMENT
Anesthesia: General | Site: Ureter | Laterality: Left

## 2018-09-18 MED ORDER — HYDROCODONE-ACETAMINOPHEN 5-325 MG PO TABS
1.0000 | ORAL_TABLET | Freq: Once | ORAL | Status: AC
  Administered 2018-09-18: 1 via ORAL
  Filled 2018-09-18: qty 1

## 2018-09-18 MED ORDER — CEFAZOLIN SODIUM-DEXTROSE 2-4 GM/100ML-% IV SOLN
INTRAVENOUS | Status: AC
  Filled 2018-09-18: qty 100

## 2018-09-18 MED ORDER — FENTANYL CITRATE (PF) 100 MCG/2ML IJ SOLN
INTRAMUSCULAR | Status: AC
  Filled 2018-09-18: qty 2

## 2018-09-18 MED ORDER — PROMETHAZINE HCL 25 MG/ML IJ SOLN
6.2500 mg | INTRAMUSCULAR | Status: DC | PRN
  Filled 2018-09-18: qty 1

## 2018-09-18 MED ORDER — LIDOCAINE 2% (20 MG/ML) 5 ML SYRINGE
INTRAMUSCULAR | Status: AC
  Filled 2018-09-18: qty 5

## 2018-09-18 MED ORDER — OXYCODONE HCL 5 MG PO TABS
ORAL_TABLET | ORAL | Status: AC
  Filled 2018-09-18: qty 1

## 2018-09-18 MED ORDER — OXYCODONE HCL 5 MG PO TABS
5.0000 mg | ORAL_TABLET | Freq: Once | ORAL | Status: DC
  Filled 2018-09-18: qty 1

## 2018-09-18 MED ORDER — SODIUM CHLORIDE 0.9% FLUSH
3.0000 mL | INTRAVENOUS | Status: DC | PRN
  Filled 2018-09-18: qty 3

## 2018-09-18 MED ORDER — FENTANYL CITRATE (PF) 100 MCG/2ML IJ SOLN
INTRAMUSCULAR | Status: DC | PRN
  Administered 2018-09-18 (×2): 50 ug via INTRAVENOUS

## 2018-09-18 MED ORDER — PROPOFOL 10 MG/ML IV BOLUS
INTRAVENOUS | Status: AC
  Filled 2018-09-18: qty 40

## 2018-09-18 MED ORDER — ONDANSETRON HCL 4 MG/2ML IJ SOLN
INTRAMUSCULAR | Status: AC
  Filled 2018-09-18: qty 2

## 2018-09-18 MED ORDER — SODIUM CHLORIDE 0.9 % IV SOLN
250.0000 mL | INTRAVENOUS | Status: DC | PRN
  Filled 2018-09-18: qty 250

## 2018-09-18 MED ORDER — FENTANYL CITRATE (PF) 100 MCG/2ML IJ SOLN
25.0000 ug | INTRAMUSCULAR | Status: DC | PRN
  Filled 2018-09-18: qty 1

## 2018-09-18 MED ORDER — LIDOCAINE HCL (CARDIAC) PF 100 MG/5ML IV SOSY
PREFILLED_SYRINGE | INTRAVENOUS | Status: DC | PRN
  Administered 2018-09-18: 60 mg via INTRAVENOUS

## 2018-09-18 MED ORDER — FENTANYL CITRATE (PF) 100 MCG/2ML IJ SOLN
25.0000 ug | INTRAMUSCULAR | Status: DC | PRN
  Administered 2018-09-18 (×2): 25 ug via INTRAVENOUS
  Filled 2018-09-18: qty 1

## 2018-09-18 MED ORDER — PROPOFOL 10 MG/ML IV BOLUS
INTRAVENOUS | Status: DC | PRN
  Administered 2018-09-18: 200 mg via INTRAVENOUS

## 2018-09-18 MED ORDER — SODIUM CHLORIDE 0.9 % IR SOLN
Status: DC | PRN
  Administered 2018-09-18: 1 via INTRAVESICAL

## 2018-09-18 MED ORDER — CEFAZOLIN SODIUM-DEXTROSE 2-4 GM/100ML-% IV SOLN
2.0000 g | INTRAVENOUS | Status: AC
  Administered 2018-09-18: 2 g via INTRAVENOUS
  Filled 2018-09-18: qty 100

## 2018-09-18 MED ORDER — HYDROMORPHONE HCL 2 MG PO TABS
2.0000 mg | ORAL_TABLET | Freq: Four times a day (QID) | ORAL | Status: DC | PRN
  Filled 2018-09-18: qty 1

## 2018-09-18 MED ORDER — SODIUM CHLORIDE 0.9% FLUSH
3.0000 mL | Freq: Two times a day (BID) | INTRAVENOUS | Status: DC
  Filled 2018-09-18: qty 3

## 2018-09-18 MED ORDER — MIDAZOLAM HCL 2 MG/2ML IJ SOLN
INTRAMUSCULAR | Status: AC
  Filled 2018-09-18: qty 2

## 2018-09-18 MED ORDER — LACTATED RINGERS IV SOLN
INTRAVENOUS | Status: DC
  Administered 2018-09-18 (×2): via INTRAVENOUS
  Filled 2018-09-18: qty 1000

## 2018-09-18 MED ORDER — MIDAZOLAM HCL 2 MG/2ML IJ SOLN
INTRAMUSCULAR | Status: DC | PRN
  Administered 2018-09-18: 1 mg via INTRAVENOUS

## 2018-09-18 MED ORDER — ACETAMINOPHEN 650 MG RE SUPP
650.0000 mg | RECTAL | Status: DC | PRN
  Filled 2018-09-18: qty 1

## 2018-09-18 MED ORDER — DEXAMETHASONE SODIUM PHOSPHATE 10 MG/ML IJ SOLN
INTRAMUSCULAR | Status: AC
  Filled 2018-09-18: qty 1

## 2018-09-18 MED ORDER — ACETAMINOPHEN 325 MG PO TABS
650.0000 mg | ORAL_TABLET | ORAL | Status: DC | PRN
  Filled 2018-09-18: qty 2

## 2018-09-18 MED ORDER — CEPHALEXIN 500 MG PO CAPS: 500.0000 mg | ORAL_CAPSULE | Freq: Three times a day (TID) | ORAL | 0 refills | Status: AC

## 2018-09-18 MED ORDER — ONDANSETRON HCL 4 MG/2ML IJ SOLN
INTRAMUSCULAR | Status: DC | PRN
  Administered 2018-09-18: 4 mg via INTRAVENOUS

## 2018-09-18 MED ORDER — HYDROCODONE-ACETAMINOPHEN 5-325 MG PO TABS
ORAL_TABLET | ORAL | Status: AC
  Filled 2018-09-18: qty 1

## 2018-09-18 MED ORDER — DEXAMETHASONE SODIUM PHOSPHATE 10 MG/ML IJ SOLN
INTRAMUSCULAR | Status: DC | PRN
  Administered 2018-09-18: 5 mg via INTRAVENOUS

## 2018-09-18 SURGICAL SUPPLY — 24 items
BAG DRAIN URO-CYSTO SKYTR STRL (DRAIN) ×2 IMPLANT
BASKET STONE 1.7 NGAGE (UROLOGICAL SUPPLIES) IMPLANT
BASKET ZERO TIP NITINOL 2.4FR (BASKET) IMPLANT
CATH URET 5FR 28IN CONE TIP (BALLOONS)
CATH URET 5FR 28IN OPEN ENDED (CATHETERS) ×1 IMPLANT
CATH URET 5FR 70CM CONE TIP (BALLOONS) IMPLANT
CLOTH BEACON ORANGE TIMEOUT ST (SAFETY) ×2 IMPLANT
ELECT REM PT RETURN 9FT ADLT (ELECTROSURGICAL)
ELECTRODE REM PT RTRN 9FT ADLT (ELECTROSURGICAL) IMPLANT
FIBER LASER FLEXIVA 365 (UROLOGICAL SUPPLIES) IMPLANT
FIBER LASER TRAC TIP (UROLOGICAL SUPPLIES) ×1 IMPLANT
GLOVE SURG SS PI 8.0 STRL IVOR (GLOVE) ×2 IMPLANT
GOWN STRL REUS W/TWL XL LVL3 (GOWN DISPOSABLE) ×2 IMPLANT
GUIDEWIRE ANG ZIPWIRE 038X150 (WIRE) ×1 IMPLANT
GUIDEWIRE STR DUAL SENSOR (WIRE) ×2 IMPLANT
IV NS IRRIG 3000ML ARTHROMATIC (IV SOLUTION) ×2 IMPLANT
KIT TURNOVER CYSTO (KITS) ×2 IMPLANT
MANIFOLD NEPTUNE II (INSTRUMENTS) ×2 IMPLANT
NS IRRIG 500ML POUR BTL (IV SOLUTION) ×2 IMPLANT
PACK CYSTO (CUSTOM PROCEDURE TRAY) ×2 IMPLANT
SHEATH URET ACCESS 12FR/55CM (UROLOGICAL SUPPLIES) ×1 IMPLANT
STENT URET 6FRX26 CONTOUR (STENTS) ×1 IMPLANT
TUBE CONNECTING 12X1/4 (SUCTIONS) ×2 IMPLANT
TUBING UROLOGY SET (TUBING) ×1 IMPLANT

## 2018-09-18 NOTE — Transfer of Care (Signed)
Immediate Anesthesia Transfer of Care Note  Patient: Anthony Daniel.  Procedure(s) Performed: CYSTOSCOPY/ LEFT RETROGRADE LEFT URETEROSCOPY /HOLMIUM LASER/STENT PLACEMENT (Left Ureter)  Patient Location: PACU  Anesthesia Type:General  Level of Consciousness: awake, alert , oriented, drowsy and patient cooperative  Airway & Oxygen Therapy: Patient Spontanous Breathing and Patient connected to nasal cannula oxygen  Post-op Assessment: Report given to RN and Post -op Vital signs reviewed and stable  Post vital signs: Reviewed and stable  Last Vitals:  Vitals Value Taken Time  BP 129/73 09/18/2018  8:57 AM  Temp    Pulse 81 09/18/2018  9:00 AM  Resp 17 09/18/2018  8:59 AM  SpO2 98 % 09/18/2018  9:00 AM  Vitals shown include unvalidated device data.  Last Pain:  Vitals:   09/18/18 0552  TempSrc: Oral  PainSc: 0-No pain         Complications: No apparent anesthesia complications

## 2018-09-18 NOTE — Anesthesia Postprocedure Evaluation (Signed)
Anesthesia Post Note  Patient: Anthony Daniel.  Procedure(s) Performed: CYSTOSCOPY/ LEFT RETROGRADE LEFT URETEROSCOPY /HOLMIUM LASER/STENT PLACEMENT (Left Ureter)     Patient location during evaluation: PACU Anesthesia Type: General Level of consciousness: awake and alert Pain management: pain level controlled Vital Signs Assessment: post-procedure vital signs reviewed and stable Respiratory status: spontaneous breathing, nonlabored ventilation, respiratory function stable and patient connected to nasal cannula oxygen Cardiovascular status: blood pressure returned to baseline and stable Postop Assessment: no apparent nausea or vomiting Anesthetic complications: no    Last Vitals:  Vitals:   09/18/18 0939 09/18/18 0941  BP: (!) 142/84   Pulse: 71 71  Resp: (!) 0 (!) (P) 0  Temp:    SpO2: 96% 96%    Last Pain:  Vitals:   09/18/18 0930  TempSrc:   PainSc: 5                  Arnulfo Batson S

## 2018-09-18 NOTE — Discharge Instructions (Addendum)
Ureteral Stent Implantation, Care After Refer to this sheet in the next few weeks. These instructions provide you with information about caring for yourself after your procedure. Your health care provider may also give you more specific instructions. Your treatment has been planned according to current medical practices, but problems sometimes occur. Call your health care provider if you have any problems or questions after your procedure. What can I expect after the procedure? After the procedure, it is common to have:  Nausea.  Mild pain when you urinate. You may feel this pain in your lower back or lower abdomen. Pain should stop within a few minutes after you urinate. This may last for up to 1 week.  A small amount of blood in your urine for several days. Follow these instructions at home:  Medicines  Take over-the-counter and prescription medicines only as told by your health care provider.  If you were prescribed an antibiotic medicine, take it as told by your health care provider. Do not stop taking the antibiotic even if you start to feel better.  Do not drive for 24 hours if you received a sedative.  Do not drive or operate heavy machinery while taking prescription pain medicines. Activity  Return to your normal activities as told by your health care provider. Ask your health care provider what activities are safe for you.  Do not lift anything that is heavier than 10 lb (4.5 kg). Follow this limit for 1 week after your procedure, or for as long as told by your health care provider. General instructions  Watch for any blood in your urine. Call your health care provider if the amount of blood in your urine increases.  If you have a catheter: ? Follow instructions from your health care provider about taking care of your catheter and collection bag. ? Do not take baths, swim, or use a hot tub until your health care provider approves.  Drink enough fluid to keep your urine  clear or pale yellow.  Keep all follow-up visits as told by your health care provider. This is important. Contact a health care provider if:  You have pain that gets worse or does not get better with medicine, especially pain when you urinate.  You have difficulty urinating.  You feel nauseous or you vomit repeatedly during a period of more than 2 days after the procedure. Get help right away if:  Your urine is dark red or has blood clots in it.  You are leaking urine (have incontinence).  The end of the stent comes out of your urethra.  You cannot urinate.  You have sudden, sharp, or severe pain in your abdomen or lower back.  You have a fever.  You may pull the stent using the attached string on Friday morning and if you don't feel comfortable doing that, you can come to the office to have it done.  Please call if you need to come in.    This information is not intended to replace advice given to you by your health care provider. Make sure you discuss any questions you have with your health care provider. Document Released: 03/06/2013 Document Revised: 12/10/2015 Document Reviewed: 01/16/2015 Elsevier Interactive Patient Education  2019 [place] [facility] Instructions  Activity: Get plenty of rest for the remainder of the day. A responsible individual must stay with you for 24 hours following the procedure.  For the next 24 hours, DO NOT: -Drive a car -Operate  machinery -Drink alcoholic beverages -Take any medication unless instructed by your physician -Make any legal decisions or sign important papers.  Meals: Start with liquid foods such as gelatin or soup. Progress to regular foods as tolerated. Avoid greasy, spicy, heavy foods. If nausea and/or vomiting occur, drink only clear liquids until the nausea and/or vomiting subsides. Call your physician if vomiting continues.  Special Instructions/Symptoms: Your throat may feel dry or sore  from the anesthesia or the breathing tube placed in your throat during surgery. If this causes discomfort, gargle with warm salt water. The discomfort should disappear within 24 hours.

## 2018-09-18 NOTE — Interval H&P Note (Signed)
History and Physical Interval Note:  Anthony Daniel has multiple left renal stones that are growing but not asymptomatic.  He has elected URS for management and has been fully informed of the risks.    09/18/2018 7:19 AM  Anthony Aynor Jesus Genera.  has presented today for surgery, with the diagnosis of LEFT RENAL STONE  The various methods of treatment have been discussed with the patient and family. After consideration of risks, benefits and other options for treatment, the patient has consented to  Procedure(s): CYSTOSCOPY/ LEFT RETROGRADE LEFT URETEROSCOPY [place] LASER/STENT PLACEMENT (Left) as a surgical intervention .  The patient's history has been reviewed, patient examined, no change in status, stable for surgery.  I have reviewed the patient's chart and labs.  Questions were answered to the patient's satisfaction.     Irine Seal

## 2018-09-18 NOTE — Op Note (Signed)
Procedure: Cystoscopy with left ureteroscopy, holmium laser application with stone removal and placement of left double-J stent.  Preop diagnosis: Left renal stones.  Postop diagnosis: Same.  Surgeon: Dr. Irine Seal.  Anesthesia: General.  Specimen: Stone fragments.  Drains: 6 French by 26 cm left contour double-J stent with tether.  EBL: None.  Complications: None.  Indications: The patient is a 68 year old male with a history of urolithiasis who was noted to have multiple left renal stones that had grown significantly over the past year.  After reviewing the options for treatment he is elected to undergo ureteroscopy with laser.  Procedure: Taken operating room was given 2 g of Ancef.  General anesthetic was induced.  He was placed in lithotomy position and fitted with PAS hose.  His perineum and genitalia were prepped with Betadine solution and draped in usual sterile fashion.  Cystoscopy was performed using the 23 [place] scope and 30 degree lens.  Examination revealed a normal urethra.  The external sphincter was intact.  The prostatic urethra was approximately 3 cm in length with bilobar hyperplasia and mild obstruction.  Examination of bladder revealed mild trabeculation.  No tumors, stones or inflammation were noted.  Ureteral orifice ease were unremarkable.  The left ureteral orifice was cannulated with a sensor wire which was passed to the kidney.  The inner core of a 55 cm 12/14 [place] digital access sheath was passed over the wire to the kidney, without resistance.  The assembled sheath was then passed to the kidney also without resistance and the inner core and wire were removed.  The dual-lumen digital flexible ureteroscope was then passed through the sheath to the kidney.  Inspection demonstrated 3 to 4 stones in the lower pole that appeared to be approximately 6-8 mm each.  In the upper pole there was a small stone that was somewhat submucosal but it was dislodged and removed  with an engage basket.  There was a larger stone in a more medial upper pole calyx which is also somewhat adherent to the papilla, but the stone was engaged with the basket and moved to the collection of stones in the lower pole.  No additional mid and upper pole stones were noted.  A 200 m tract tip holmium laser fiber was then passed through the scope and the stones were engaged with the initial settings at 0.5 J and 10 Hz.  The power was adjusted up to 0.8 J at intervals and the frequency up to 53 Hz as needed.  The stones in the lower pole were broken and the manageable fragments and once adequately reduced in size removed with the engage basket.  Final inspection after completion of the fragmentation and removal of the large fragments demonstrated some residual grit and dust, all of which should pass without difficulty.  There was minimal bleeding.  A guidewire was passed to the kidney through the scope and the scope and sheath were removed under direct vision.  No ureteral injury was identified.  The cystoscope was reinserted over the wire and a 6 [place] by 26 cm contour double-J stent was passed under fluoroscopic guidance.  The wire was removed leaving a good coil in the kidney and a good coil in the bladder.  The bladder was drained and the cystoscope was removed leaving the stent string exiting the urethra.  The string was secured to the patient's penis.  He was taken down from lithotomy position, his anesthetic was reversed and he was moved to recovery in stable  condition.  There were no complications.

## 2018-09-18 NOTE — Anesthesia Procedure Notes (Signed)
Procedure Name: LMA Insertion Date/Time: 09/18/2018 7:30 AM Performed by: Raenette Rover, CRNA Pre-anesthesia Checklist: Patient identified, Emergency Drugs available, Suction available and Patient being monitored Patient Re-evaluated:Patient Re-evaluated prior to induction Oxygen Delivery Method: Circle system utilized Preoxygenation: Pre-oxygenation with 100% oxygen Induction Type: IV induction LMA: LMA inserted LMA Size: 4.0 Number of attempts: 1 Placement Confirmation: positive ETCO2,  CO2 detector and breath sounds checked- equal and bilateral Tube secured with: Tape Dental Injury: Teeth and Oropharynx as per pre-operative assessment

## 2018-09-18 NOTE — Anesthesia Preprocedure Evaluation (Signed)
Anesthesia Evaluation  Patient identified by MRN, date of birth, ID band Patient awake    Reviewed: Allergy & Precautions, NPO status , Patient's Chart, lab work & pertinent test results  Airway Mallampati: II  TM Distance: >3 FB Neck ROM: Full    Dental no notable dental hx.    Pulmonary sleep apnea , Current Smoker,    Pulmonary exam normal breath sounds clear to auscultation       Cardiovascular hypertension, Normal cardiovascular exam Rhythm:Regular Rate:Normal     Neuro/Psych negative neurological ROS  negative psych ROS   GI/Hepatic negative GI ROS, Neg liver ROS,   Endo/Other  diabetes, Insulin Dependent  Renal/GU negative Renal ROS  negative genitourinary   Musculoskeletal negative musculoskeletal ROS (+)   Abdominal   Peds negative pediatric ROS (+)  Hematology negative hematology ROS (+)   Anesthesia Other Findings   Reproductive/Obstetrics negative OB ROS                             Anesthesia Physical Anesthesia Plan  ASA: III  Anesthesia Plan: General   Post-op Pain Management:    Induction: Intravenous  PONV Risk Score and Plan: 2 and Ondansetron, Dexamethasone and Treatment may vary due to age or medical condition  Airway Management Planned: LMA  Additional Equipment:   Intra-op Plan:   Post-operative Plan: Extubation in OR  Informed Consent: I have reviewed the patients History and Physical, chart, labs and discussed the procedure including the risks, benefits and alternatives for the proposed anesthesia with the patient or authorized representative who has indicated his/her understanding and acceptance.     Dental advisory given  Plan Discussed with: CRNA and Surgeon  Anesthesia Plan Comments:         Anesthesia Quick Evaluation

## 2018-09-19 ENCOUNTER — Encounter (HOSPITAL_BASED_OUTPATIENT_CLINIC_OR_DEPARTMENT_OTHER): Payer: Self-pay | Admitting: Urology

## 2018-09-25 ENCOUNTER — Other Ambulatory Visit: Payer: BC Managed Care – PPO

## 2018-09-25 ENCOUNTER — Encounter: Payer: Self-pay | Admitting: Internal Medicine

## 2018-09-25 ENCOUNTER — Telehealth: Payer: Self-pay | Admitting: Internal Medicine

## 2018-09-25 ENCOUNTER — Ambulatory Visit (INDEPENDENT_AMBULATORY_CARE_PROVIDER_SITE_OTHER): Payer: Medicare Other | Admitting: Internal Medicine

## 2018-09-25 VITALS — BP 130/60 | HR 95 | Temp 97.8°F | Ht 73.0 in | Wt 251.2 lb

## 2018-09-25 DIAGNOSIS — L989 Disorder of the skin and subcutaneous tissue, unspecified: Secondary | ICD-10-CM

## 2018-09-25 DIAGNOSIS — L820 Inflamed seborrheic keratosis: Secondary | ICD-10-CM | POA: Diagnosis not present

## 2018-09-25 NOTE — Progress Notes (Signed)
Subjective:  Patient ID: Merrilee Seashore., male    DOB: Nov 17, 1950  Age: 68 y.o. MRN: [id number]  CC: Follow-up   HPI Keri Tavella. presents for he has had a lesion on his left posterior scalp that has been enlarging over the last 9 months.  It is not symptomatic but he wants to have it removed.  Outpatient Medications Prior to Visit  Medication Sig Dispense Refill  . aspirin EC 81 MG tablet Take 81 mg by mouth daily.    . BD INSULIN SYRINGE U/F 30G X 1/2" 0.5 ML MISC     . candesartan-hydrochlorothiazide (ATACAND HCT) 32-12.5 MG tablet Take 1 tablet by mouth daily. (Patient taking differently: Take 1 tablet by mouth every morning. ) 90 tablet 1  . Cholecalciferol (VITAMIN D) 2000 units tablet Take 2,000 Units by mouth daily.    . Ciclopirox 0.77 % gel APPLY TWICE A DAY TO AFFECTED AREA. (Patient taking differently: 2 (two) times daily as needed. Apply twice a day to affected area.) 30 g 2  . ezetimibe (ZETIA) 10 MG tablet TAKE 1 TABLET BY MOUTH EVERY DAY (Patient taking differently: every morning. TAKE 1 TABLET BY MOUTH EVERY DAY) 90 tablet 1  . fluticasone (FLONASE) 50 MCG/ACT nasal spray Place 2 sprays into both nostrils daily. (Patient taking differently: Place 2 sprays into both nostrils daily as needed. ) 16 g 3  . HYDROcodone-acetaminophen (NORCO/VICODIN) 5-325 MG tablet     . Insulin Glargine (LANTUS SOLOSTAR [place]) Inject 15 Units into the skin 2 (two) times daily as needed. Only uses when pump goes out    . Insulin Human (INSULIN PUMP) SOLN Inject 1 each into the skin continuous. Novolog insulin    . levocetirizine (XYZAL) 5 MG tablet TAKE 1 TABLET BY MOUTH EVERY DAY IN THE EVENING 90 tablet 1  . meloxicam (MOBIC) 15 MG tablet TAKE 1 TABLET (15 MG TOTAL) BY MOUTH DAILY. 90 tablet 0  . metaxalone (SKELAXIN) 400 MG tablet TAKE 1 TABLET (400 MG TOTAL) BY MOUTH 3 (THREE) TIMES DAILY. 90 tablet 2  . NOVOLOG 100 UNIT/ML injection     . nystatin (MYCOSTATIN/NYSTOP) powder     .  ONETOUCH VERIO test strip     . Pitavastatin Calcium (LIVALO) 2 MG TABS Take 1 tablet (2 mg total) by mouth daily. (Patient taking differently: Take 2 mg by mouth every morning. ) 90 tablet 1  . Polyethyl Glycol-Propyl Glycol (SYSTANE OP) Place 1 drop into both eyes every 8 (eight) hours as needed (dry eyes).    . Potassium Citrate 15 MEQ (1620 MG) TBCR Take 1 tablet by mouth 2 (two) times daily.    . sildenafil (REVATIO) 20 MG tablet Take 20 mg by mouth daily as needed (erectile dysfunction).    . tamsulosin (FLOMAX) 0.4 MG CAPS capsule Take 1 capsule (0.4 mg total) by mouth daily. (Patient taking differently: Take 0.4 mg by mouth every morning. ) 30 capsule 0   No facility-administered medications prior to visit.     ROS Review of Systems  Objective:  BP 130/60 (BP Location: Left Arm, Patient Position: Sitting, Cuff Size: Large)   Pulse 95   Temp 97.8 F (36.6 C) (Oral)   Ht 6\' 1"  (1.854 m)   Wt 251 lb 4 oz (114 kg)   SpO2 97%   BMI 33.15 kg/m   BP Readings from Last 3 Encounters:  09/25/18 130/60  09/18/18 (!) 150/75  09/06/18 (!) 156/92    Wt Readings from Last  3 Encounters:  09/25/18 251 lb 4 oz (114 kg)  09/18/18 247 lb 9.6 oz (112.3 kg)  09/06/18 251 lb 12 oz (114.2 kg)    Physical Exam HENT:     Head:      Comments: The area on the left posterior scalp was cleaned with Betadine.  Local anesthesia was obtained with instillation of 1% lidocaine with epi.  About 3 cc were used.  Next a 6 mm punch incision was used to remove the lesion.  The resulting wound was closed with 1 simple interrupted suture using 5-0 nylon on a PC 3.  Closure affect is good.  Hemostasis has been obtained. A dressing cannot be applied due to the hair.  Triple antibiotic ointment was applied.    Lab Results  Component Value Date   WBC 11.6 (H) 09/06/2018   HGB 15.3 09/06/2018   HCT 46.4 09/06/2018   PLT 164.0 09/06/2018   GLUCOSE 94 09/06/2018   CHOL 83 09/06/2018   TRIG 53.0 09/06/2018    HDL 37.10 (L) 09/06/2018   LDLCALC 35 09/06/2018   ALT 18 08/23/2017   AST 13 (A) 08/23/2017   NA 140 09/06/2018   K 4.2 09/06/2018   CL 106 09/06/2018   CREATININE 1.02 09/06/2018   BUN 16 09/06/2018   CO2 26 09/06/2018   TSH 0.90 09/06/2018   PSA 4.14 06/22/2018   INR 1.02 08/06/2015   HGBA1C 7.4 (H) 09/06/2018   MICROALBUR 4.46 01/30/2017    Dg Chest 2 View  Result Date: 09/06/2018 CLINICAL DATA:  Cough. EXAM: CHEST - 2 VIEW COMPARISON:  Chest x-ray 09/19/2012. FINDINGS: Mediastinum and hilar structures normal. Heart size normal. Lungs are clear. No pleural effusion or pneumothorax. IMPRESSION: No acute cardiopulmonary disease. Electronically Signed   By: [place]   On: 09/06/2018 15:12    Assessment & Plan:   Keyan was seen today for follow-up.  Diagnoses and all orders for this visit:  Skin lesion- The lesion has been successfully removed and sent for pathology.  It looks like a cutaneous horn. Will also rule rule out squamous cell carcinoma. -     Cancel: Dermatology pathology -     Dermatology pathology; Future   I am having Merrilee Seashore. "Tommy" maintain his Insulin Glargine (LANTUS SOLOSTAR [place]), Potassium Citrate, aspirin EC, tamsulosin, Polyethyl Glycol-Propyl Glycol (SYSTANE OP), insulin pump, sildenafil, Vitamin D, NovoLOG, Pitavastatin Calcium, ezetimibe, metaxalone, Ciclopirox, OneTouch Verio, HYDROcodone-acetaminophen, BD Insulin Syringe U/F, nystatin, fluticasone, meloxicam, candesartan-hydrochlorothiazide, and levocetirizine.  No orders of the defined types were placed in this encounter.    Follow-up: Return in about 1 week (around 10/02/2018).  Scarlette Calico, MD

## 2018-09-25 NOTE — Telephone Encounter (Signed)
Patient was seen today and asked if he should be taking an antibiotic? If so please sent to [facility] on [address].  Please follow up with patient.

## 2018-09-25 NOTE — Telephone Encounter (Signed)
Left detailed message informing pt that he does not need an abx at this time.

## 2018-09-25 NOTE — Patient Instructions (Signed)
Skin Biopsy, Care After  This sheet gives you information about how to care for yourself after your procedure. Your health care provider may also give you more specific instructions. If you have problems or questions, contact your health care provider.  What can I expect after the procedure?  After the procedure, it is common to have:   Soreness.   Bruising.   Itching.  Follow these instructions at home:  Biopsy site care  Follow instructions from your health care provider about how to take care of your biopsy site. Make sure you:   Wash your hands with soap and water before and after you change your bandage (dressing). If soap and water are not available, use hand sanitizer.   Apply ointment on your biopsy site as directed by your health care provider.   Change your dressing as told by your health care provider.   Leave stitches (sutures), skin glue, or adhesive strips in place. These skin closures may need to stay in place for 2 weeks or longer. If adhesive strip edges start to loosen and curl up, you may trim the loose edges. Do not remove adhesive strips completely unless your health care provider tells you to do that.   If the biopsy area bleeds, apply gentle pressure for 10 minutes.  Check your biopsy site every day for signs of infection. Check for:   Redness, swelling, or pain.   Fluid or blood.   Warmth.   Pus or a bad smell.    General instructions   Rest and then return to your normal activities as told by your health care provider.   Take over-the-counter and prescription medicines only as told by your health care provider.   Keep all follow-up visits as told by your health care provider. This is important.  Contact a health care provider if:   You have redness, swelling, or pain around your biopsy site.   You have fluid or blood coming from your biopsy site.   Your biopsy site feels warm to the touch.   You have pus or a bad smell coming from your biopsy site.   You have a  fever.   Your sutures, skin glue, or adhesive strips loosen or come off sooner than expected.  Get help right away if:   You have bleeding that does not stop with pressure or a dressing.  Summary   After the procedure, it is common to have soreness, bruising, and itching at the site.   Follow instructions from your health care provider about how to take care of your biopsy site.   Check your biopsy site every day for signs of infection.   Contact a health care provider if you have redness, swelling, or pain around your biopsy site, or your biopsy site feels warm to the touch.   Keep all follow-up visits as told by your health care provider. This is important.  This information is not intended to replace advice given to you by your health care provider. Make sure you discuss any questions you have with your health care provider.  Document Released: 07/31/2015 Document Revised: 01/01/2018 Document Reviewed: 01/01/2018  Elsevier Interactive Patient Education  2019 Elsevier Inc[employer]

## 2018-09-26 ENCOUNTER — Encounter: Payer: Self-pay | Admitting: Internal Medicine

## 2018-10-02 DIAGNOSIS — N200 Calculus of kidney: Secondary | ICD-10-CM | POA: Diagnosis not present

## 2018-10-03 ENCOUNTER — Other Ambulatory Visit: Payer: Self-pay | Admitting: Internal Medicine

## 2018-10-03 DIAGNOSIS — E785 Hyperlipidemia, unspecified: Secondary | ICD-10-CM

## 2018-10-03 DIAGNOSIS — M5412 Radiculopathy, cervical region: Secondary | ICD-10-CM

## 2018-10-03 DIAGNOSIS — M5030 Other cervical disc degeneration, unspecified cervical region: Secondary | ICD-10-CM

## 2018-10-04 ENCOUNTER — Other Ambulatory Visit: Payer: Self-pay | Admitting: Internal Medicine

## 2018-10-04 DIAGNOSIS — E785 Hyperlipidemia, unspecified: Secondary | ICD-10-CM

## 2018-10-10 ENCOUNTER — Other Ambulatory Visit: Payer: Self-pay

## 2018-10-10 ENCOUNTER — Encounter: Payer: Self-pay | Admitting: Internal Medicine

## 2018-10-10 ENCOUNTER — Ambulatory Visit (INDEPENDENT_AMBULATORY_CARE_PROVIDER_SITE_OTHER): Payer: Medicare Other | Admitting: Internal Medicine

## 2018-10-10 VITALS — BP 126/60 | HR 72 | Temp 98.2°F | Resp 16 | Ht 73.0 in | Wt 252.0 lb

## 2018-10-10 DIAGNOSIS — Z5189 Encounter for other specified aftercare: Secondary | ICD-10-CM

## 2018-10-10 NOTE — Progress Notes (Signed)
Subjective:  Patient ID: Anthony Seashore., male    DOB: 08-11-1950  Age: 68 y.o. MRN: [id number]  CC: Wound Check   HPI Anthony Daniel. presents for a wound check - He is 2 weeks status post excisional biopsy of a lesion on his left scalp.  The area has healed nicely.  The biopsy was positive for an inflamed seborrheic keratosis.  There was no evidence of squamous cell carcinoma.  Outpatient Medications Prior to Visit  Medication Sig Dispense Refill  . aspirin EC 81 MG tablet Take 81 mg by mouth daily.    . BD INSULIN SYRINGE U/F 30G X 1/2" 0.5 ML MISC     . candesartan-hydrochlorothiazide (ATACAND HCT) 32-12.5 MG tablet Take 1 tablet by mouth daily. (Patient taking differently: Take 1 tablet by mouth every morning. ) 90 tablet 1  . Cholecalciferol (VITAMIN D) 2000 units tablet Take 2,000 Units by mouth daily.    . Ciclopirox 0.77 % gel APPLY TWICE A DAY TO AFFECTED AREA. (Patient taking differently: 2 (two) times daily as needed. Apply twice a day to affected area.) 30 g 2  . ezetimibe (ZETIA) 10 MG tablet TAKE 1 TABLET BY MOUTH EVERY DAY (Patient taking differently: every morning. TAKE 1 TABLET BY MOUTH EVERY DAY) 90 tablet 1  . fluticasone (FLONASE) 50 MCG/ACT nasal spray Place 2 sprays into both nostrils daily. (Patient taking differently: Place 2 sprays into both nostrils daily as needed. ) 16 g 3  . HYDROcodone-acetaminophen (NORCO/VICODIN) 5-325 MG tablet     . Insulin Glargine (LANTUS SOLOSTAR Anthony Daniel) Inject 15 Units into the skin 2 (two) times daily as needed. Only uses when pump goes out    . Insulin Human (INSULIN PUMP) SOLN Inject 1 each into the skin continuous. Novolog insulin    . levocetirizine (XYZAL) 5 MG tablet TAKE 1 TABLET BY MOUTH EVERY DAY IN THE EVENING 90 tablet 1  . meloxicam (MOBIC) 15 MG tablet TAKE 1 TABLET (15 MG TOTAL) BY MOUTH DAILY. 90 tablet 0  . metaxalone (SKELAXIN) 400 MG tablet TAKE 1 TABLET (400 MG TOTAL) BY MOUTH 3 (THREE) TIMES DAILY. 270 tablet 0  .  NOVOLOG 100 UNIT/ML injection     . nystatin (MYCOSTATIN/NYSTOP) powder     . ONETOUCH VERIO test strip     . Pitavastatin Calcium (LIVALO) 2 MG TABS Take 1 tablet (2 mg total) by mouth every morning. 90 tablet 1  . Polyethyl Glycol-Propyl Glycol (SYSTANE OP) Place 1 drop into both eyes every 8 (eight) hours as needed (dry eyes).    . Potassium Citrate 15 MEQ (1620 MG) TBCR Take 1 tablet by mouth 2 (two) times daily.    . sildenafil (REVATIO) 20 MG tablet Take 20 mg by mouth daily as needed (erectile dysfunction).    . tamsulosin (FLOMAX) 0.4 MG CAPS capsule Take 1 capsule (0.4 mg total) by mouth daily. (Patient taking differently: Take 0.4 mg by mouth every morning. ) 30 capsule 0   No facility-administered medications prior to visit.     ROS Review of Systems  All other systems reviewed and are negative.   Objective:  BP 126/60 (BP Location: Left Arm, Patient Position: Sitting, Cuff Size: Large)   Pulse 72   Temp 98.2 F (36.8 C) (Oral)   Resp 16   Ht 6\' 1"  (1.854 m)   Wt 252 lb (114.3 kg)   SpO2 95%   BMI 33.25 kg/m   BP Readings from Last 3 Encounters:  10/10/18 126/60  09/25/18 130/60  09/18/18 (!) 150/75    Wt Readings from Last 3 Encounters:  10/10/18 252 lb (114.3 kg)  09/25/18 251 lb 4 oz (114 kg)  09/18/18 247 lb 9.6 oz (112.3 kg)    Physical Exam HENT:     Head:      Lab Results  Component Value Date   WBC 11.6 (H) 09/06/2018   HGB 15.3 09/06/2018   HCT 46.4 09/06/2018   PLT 164.0 09/06/2018   GLUCOSE 94 09/06/2018   CHOL 83 09/06/2018   TRIG 53.0 09/06/2018   HDL 37.10 (L) 09/06/2018   LDLCALC 35 09/06/2018   ALT 18 08/23/2017   AST 13 (A) 08/23/2017   NA 140 09/06/2018   K 4.2 09/06/2018   CL 106 09/06/2018   CREATININE 1.02 09/06/2018   BUN 16 09/06/2018   CO2 26 09/06/2018   TSH 0.90 09/06/2018   PSA 4.14 06/22/2018   INR 1.02 08/06/2015   HGBA1C 7.4 (H) 09/06/2018   MICROALBUR 4.46 01/30/2017    Dg Chest 2 View  Result Date:  09/06/2018 CLINICAL DATA:  Cough. EXAM: CHEST - 2 VIEW COMPARISON:  Chest x-ray 09/19/2012. FINDINGS: Mediastinum and hilar structures normal. Heart size normal. Lungs are clear. No pleural effusion or pneumothorax. IMPRESSION: No acute cardiopulmonary disease. Electronically Signed   By: [place]   On: 09/06/2018 15:12    Assessment & Plan:   Anthony Daniel was seen today for wound check.  Diagnoses and all orders for this visit:  Visit for wound check- The suture was removed.  The wound has healed without complications.   I am having Anthony Seashore. "Anthony Daniel" maintain his Insulin Glargine (LANTUS SOLOSTAR Anthony Daniel), Potassium Citrate, aspirin EC, tamsulosin, Polyethyl Glycol-Propyl Glycol (SYSTANE OP), insulin pump, sildenafil, Vitamin D, NovoLOG, ezetimibe, Ciclopirox, OneTouch Verio, HYDROcodone-acetaminophen, BD Insulin Syringe U/F, nystatin, fluticasone, meloxicam, candesartan-hydrochlorothiazide, levocetirizine, Pitavastatin Calcium, and metaxalone.  No orders of the defined types were placed in this encounter.    Follow-up: No follow-ups on file.  Scarlette Calico, MD

## 2018-10-10 NOTE — Patient Instructions (Signed)
Wound Care, Adult  Taking care of your wound properly can help to prevent pain, infection, and scarring. It can also help your wound to heal more quickly.  How to care for your wound  Wound care          Follow instructions from your health care provider about how to take care of your wound. Make sure you:  ? Wash your hands with soap and water before you change the bandage (dressing). If soap and water are not available, use hand sanitizer.  ? Change your dressing as told by your health care provider.  ? Leave stitches (sutures), skin glue, or adhesive strips in place. These skin closures may need to stay in place for 2 weeks or longer. If adhesive strip edges start to loosen and curl up, you may trim the loose edges. Do not remove adhesive strips completely unless your health care provider tells you to do that.   Check your wound area every day for signs of infection. Check for:  ? Redness, swelling, or pain.  ? Fluid or blood.  ? Warmth.  ? Pus or a bad smell.   Ask your health care provider if you should clean the wound with mild soap and water. Doing this may include:  ? Using a clean towel to pat the wound dry after cleaning it. Do not rub or scrub the wound.  ? Applying a cream or ointment. Do this only as told by your health care provider.  ? Covering the incision with a clean dressing.   Ask your health care provider when you can leave the wound uncovered.   Keep the dressing dry until your health care provider says it can be removed. Do not take baths, swim, use a hot tub, or do anything that would put the wound underwater until your health care provider approves. Ask your health care provider if you can take showers. You may only be allowed to take sponge baths.  Medicines     If you were prescribed an antibiotic medicine, cream, or ointment, take or use the antibiotic as told by your health care provider. Do not stop taking or using the antibiotic even if your condition improves.   Take  over-the-counter and prescription medicines only as told by your health care provider. If you were prescribed pain medicine, take it 30 or more minutes before you do any wound care or as told by your health care provider.  General instructions   Return to your normal activities as told by your health care provider. Ask your health care provider what activities are safe.   Do not scratch or pick at the wound.   Do not use any products that contain nicotine or tobacco, such as cigarettes and e-cigarettes. These may delay wound healing. If you need help quitting, ask your health care provider.   Keep all follow-up visits as told by your health care provider. This is important.   Eat a diet that includes protein, vitamin A, vitamin C, and other nutrient-rich foods to help the wound heal.  ? Foods rich in protein include meat, dairy, beans, nuts, and other sources.  ? Foods rich in vitamin A include carrots and dark green, leafy vegetables.  ? Foods rich in vitamin C include citrus, tomatoes, and other fruits and vegetables.  ? Nutrient-rich foods have protein, carbohydrates, fat, vitamins, or minerals. Eat a variety of healthy foods including vegetables, fruits, and whole grains.  Contact a health care provider if:     You received a tetanus shot and you have swelling, severe pain, redness, or bleeding at the injection site.   Your pain is not controlled with medicine.   You have redness, swelling, or pain around the wound.   You have fluid or blood coming from the wound.   Your wound feels warm to the touch.   You have pus or a bad smell coming from the wound.   You have a fever or chills.   You are nauseous or you vomit.   You are dizzy.  Get help right away if:   You have a red streak going away from your wound.   The edges of the wound open up and separate.   Your wound is bleeding, and the bleeding does not stop with gentle pressure.   You have a rash.   You faint.   You have trouble  breathing.  Summary   Always wash your hands with soap and water before changing your bandage (dressing).   To help with healing, eat foods that are rich in protein, vitamin A, vitamin C, and other nutrients.   Check your wound every day for signs of infection. Contact your health care provider if you suspect that your wound is infected.  This information is not intended to replace advice given to you by your health care provider. Make sure you discuss any questions you have with your health care provider.  Document Released: 04/12/2008 Document Revised: 08/15/2017 Document Reviewed: 01/19/2016  Elsevier Interactive Patient Education  2019 [employer]vier Inc.

## 2018-10-15 DIAGNOSIS — C61 Malignant neoplasm of prostate: Secondary | ICD-10-CM | POA: Diagnosis not present

## 2018-10-15 LAB — PSA
PSA: 5.32
PSA: 5.32

## 2018-10-22 DIAGNOSIS — C61 Malignant neoplasm of prostate: Secondary | ICD-10-CM | POA: Diagnosis not present

## 2018-10-22 DIAGNOSIS — N200 Calculus of kidney: Secondary | ICD-10-CM | POA: Diagnosis not present

## 2018-11-06 ENCOUNTER — Other Ambulatory Visit: Payer: Self-pay | Admitting: Internal Medicine

## 2018-11-06 DIAGNOSIS — M5030 Other cervical disc degeneration, unspecified cervical region: Secondary | ICD-10-CM

## 2018-11-18 ENCOUNTER — Other Ambulatory Visit: Payer: Self-pay | Admitting: Internal Medicine

## 2018-11-21 ENCOUNTER — Other Ambulatory Visit: Payer: Self-pay | Admitting: Internal Medicine

## 2018-11-21 DIAGNOSIS — M5030 Other cervical disc degeneration, unspecified cervical region: Secondary | ICD-10-CM

## 2018-12-18 DIAGNOSIS — E1022 Type 1 diabetes mellitus with diabetic chronic kidney disease: Secondary | ICD-10-CM | POA: Diagnosis not present

## 2018-12-18 DIAGNOSIS — N181 Chronic kidney disease, stage 1: Secondary | ICD-10-CM | POA: Diagnosis not present

## 2018-12-18 DIAGNOSIS — E785 Hyperlipidemia, unspecified: Secondary | ICD-10-CM | POA: Diagnosis not present

## 2018-12-18 DIAGNOSIS — Z794 Long term (current) use of insulin: Secondary | ICD-10-CM | POA: Diagnosis not present

## 2018-12-18 DIAGNOSIS — Z720 Tobacco use: Secondary | ICD-10-CM | POA: Diagnosis not present

## 2018-12-18 DIAGNOSIS — E1165 Type 2 diabetes mellitus with hyperglycemia: Secondary | ICD-10-CM | POA: Diagnosis not present

## 2018-12-21 ENCOUNTER — Encounter: Payer: Self-pay | Admitting: Internal Medicine

## 2018-12-21 ENCOUNTER — Telehealth: Payer: Self-pay

## 2018-12-21 NOTE — Telephone Encounter (Signed)
Key: [id number]

## 2018-12-21 NOTE — Telephone Encounter (Signed)
PA has been approved. Pt and pharmacy has been informed of same.

## 2018-12-30 ENCOUNTER — Other Ambulatory Visit: Payer: Self-pay | Admitting: Internal Medicine

## 2018-12-30 DIAGNOSIS — M5412 Radiculopathy, cervical region: Secondary | ICD-10-CM

## 2018-12-30 DIAGNOSIS — M5030 Other cervical disc degeneration, unspecified cervical region: Secondary | ICD-10-CM

## 2019-01-08 ENCOUNTER — Encounter: Payer: Self-pay | Admitting: Internal Medicine

## 2019-01-10 ENCOUNTER — Telehealth: Payer: Self-pay

## 2019-01-10 ENCOUNTER — Encounter: Payer: Self-pay | Admitting: Internal Medicine

## 2019-01-10 ENCOUNTER — Telehealth: Payer: Self-pay | Admitting: Internal Medicine

## 2019-01-10 DIAGNOSIS — Z20822 Contact with and (suspected) exposure to covid-19: Secondary | ICD-10-CM

## 2019-01-10 NOTE — Telephone Encounter (Signed)
Pt is currently experiencing symptoms according to PCP and my chart message pt sent.

## 2019-01-10 NOTE — Telephone Encounter (Signed)
Patient is requesting an antibody test due to being sick in January.  Call back # [phone]

## 2019-01-10 NOTE — Telephone Encounter (Signed)
PCP is requesting pt to have the COVID test.

## 2019-01-10 NOTE — Telephone Encounter (Signed)
Left patient a voicemail to return call to schedule COVID 19 test.  Order entered.

## 2019-01-11 DIAGNOSIS — Z20828 Contact with and (suspected) exposure to other viral communicable diseases: Secondary | ICD-10-CM | POA: Diagnosis not present

## 2019-01-11 NOTE — Telephone Encounter (Signed)
Pt informed that PCP wanted COVID test. Pt had COVID test today and pt asked about the abs test. I informed that the symptoms he listed were past symptoms and are no longer current. Pt informed that the abs test is not the most accurate at this time. Pt stated that he did not want the abs yet.

## 2019-01-11 NOTE — Telephone Encounter (Signed)
Patient would like to get antibody test due to illness he experienced in January 2020.  He states he was tested today for COVID 19 because he is experiencing symptoms.

## 2019-01-22 ENCOUNTER — Emergency Department (HOSPITAL_COMMUNITY)
Admission: EM | Admit: 2019-01-22 | Discharge: 2019-01-23 | Disposition: A | Discharge status: Home / Self Care | Payer: Medicare Other | Attending: Emergency Medicine | Admitting: Emergency Medicine

## 2019-01-22 ENCOUNTER — Other Ambulatory Visit: Payer: Self-pay

## 2019-01-22 DIAGNOSIS — Z791 Long term (current) use of non-steroidal anti-inflammatories (NSAID): Secondary | ICD-10-CM | POA: Diagnosis not present

## 2019-01-22 DIAGNOSIS — G4730 Sleep apnea, unspecified: Secondary | ICD-10-CM | POA: Insufficient documentation

## 2019-01-22 DIAGNOSIS — E1122 Type 2 diabetes mellitus with diabetic chronic kidney disease: Secondary | ICD-10-CM | POA: Diagnosis not present

## 2019-01-22 DIAGNOSIS — I129 Hypertensive chronic kidney disease with stage 1 through stage 4 chronic kidney disease, or unspecified chronic kidney disease: Secondary | ICD-10-CM | POA: Diagnosis not present

## 2019-01-22 DIAGNOSIS — F17210 Nicotine dependence, cigarettes, uncomplicated: Secondary | ICD-10-CM | POA: Diagnosis not present

## 2019-01-22 DIAGNOSIS — N200 Calculus of kidney: Secondary | ICD-10-CM | POA: Diagnosis not present

## 2019-01-22 DIAGNOSIS — Z1159 Encounter for screening for other viral diseases: Secondary | ICD-10-CM | POA: Insufficient documentation

## 2019-01-22 DIAGNOSIS — E785 Hyperlipidemia, unspecified: Secondary | ICD-10-CM | POA: Insufficient documentation

## 2019-01-22 DIAGNOSIS — R1031 Right lower quadrant pain: Secondary | ICD-10-CM | POA: Diagnosis not present

## 2019-01-22 DIAGNOSIS — N181 Chronic kidney disease, stage 1: Secondary | ICD-10-CM | POA: Diagnosis not present

## 2019-01-22 DIAGNOSIS — Z8551 Personal history of malignant neoplasm of bladder: Secondary | ICD-10-CM | POA: Insufficient documentation

## 2019-01-22 DIAGNOSIS — N202 Calculus of kidney with calculus of ureter: Secondary | ICD-10-CM | POA: Diagnosis not present

## 2019-01-22 DIAGNOSIS — N179 Acute kidney failure, unspecified: Secondary | ICD-10-CM

## 2019-01-22 DIAGNOSIS — Z6833 Body mass index (BMI) 33.0-33.9, adult: Secondary | ICD-10-CM | POA: Insufficient documentation

## 2019-01-22 DIAGNOSIS — R109 Unspecified abdominal pain: Secondary | ICD-10-CM | POA: Diagnosis present

## 2019-01-22 DIAGNOSIS — Z8546 Personal history of malignant neoplasm of prostate: Secondary | ICD-10-CM | POA: Insufficient documentation

## 2019-01-22 DIAGNOSIS — E669 Obesity, unspecified: Secondary | ICD-10-CM | POA: Diagnosis not present

## 2019-01-22 DIAGNOSIS — Z03818 Encounter for observation for suspected exposure to other biological agents ruled out: Secondary | ICD-10-CM | POA: Diagnosis not present

## 2019-01-22 DIAGNOSIS — Z794 Long term (current) use of insulin: Secondary | ICD-10-CM | POA: Diagnosis not present

## 2019-01-22 HISTORY — DX: Other specified postprocedural states: R11.2

## 2019-01-22 HISTORY — DX: Other specified postprocedural states: Z98.890

## 2019-01-22 LAB — BASIC METABOLIC PANEL
Anion gap: 9 (ref 5–15)
BUN: 16 mg/dL (ref 8–23)
CO2: 24 mmol/L (ref 22–32)
Calcium: 8.7 mg/dL — ABNORMAL LOW (ref 8.9–10.3)
Chloride: 103 mmol/L (ref 98–111)
Creatinine, Ser: 1.37 mg/dL — ABNORMAL HIGH (ref 0.61–1.24)
GFR calc Af Amer: >60 mL/min (ref >60)
GFR calc non Af Amer: 53 mL/min — ABNORMAL LOW (ref >60)
Glucose, Bld: 208 mg/dL — ABNORMAL HIGH (ref 70–99)
Potassium: 4.6 mmol/L (ref 3.5–5.1)
Sodium: 136 mmol/L (ref 135–145)

## 2019-01-22 LAB — URINALYSIS, ROUTINE W REFLEX MICROSCOPIC
Bilirubin Urine: NEGATIVE
Glucose, UA: NEGATIVE mg/dL
Ketones, ur: NEGATIVE mg/dL
Leukocytes,Ua: NEGATIVE
Nitrite: NEGATIVE
Protein, ur: NEGATIVE mg/dL
Specific Gravity, Urine: 1.019 (ref 1.005–1.030)
pH: 5 (ref 5.0–8.0)

## 2019-01-22 LAB — CBC
HCT: 42.6 % (ref 39.0–52.0)
Hemoglobin: 14.1 g/dL (ref 13.0–17.0)
MCH: 30.5 pg (ref 26.0–34.0)
MCHC: 33.1 g/dL (ref 30.0–36.0)
MCV: 92 fL (ref 80.0–100.0)
Platelets: 172 10*3/uL (ref 150–400)
RBC: 4.63 MIL/uL (ref 4.22–5.81)
RDW: 14.5 % (ref 11.5–15.5)
WBC: 12.3 10*3/uL — ABNORMAL HIGH (ref 4.0–10.5)
nRBC: 0 % (ref 0.0–0.2)

## 2019-01-22 MED ORDER — SODIUM CHLORIDE 0.9% FLUSH
3.0000 mL | Freq: Once | INTRAVENOUS | Status: AC
  Administered 2019-01-23: 3 mL via INTRAVENOUS

## 2019-01-22 NOTE — ED Triage Notes (Signed)
Pt says that he had onset of right flank pain tonight, radiates around to the right lower abdomen, associated with nausea. Denies hematuria, hx of kidney stones, feels similar, took 1 hydrocodone for pain PTA.

## 2019-01-23 ENCOUNTER — Emergency Department (HOSPITAL_COMMUNITY): Payer: Medicare Other | Admitting: Certified Registered Nurse Anesthetist

## 2019-01-23 ENCOUNTER — Encounter (HOSPITAL_COMMUNITY): Payer: Self-pay | Admitting: Anesthesiology

## 2019-01-23 ENCOUNTER — Other Ambulatory Visit: Payer: Self-pay | Admitting: Urology

## 2019-01-23 ENCOUNTER — Emergency Department (HOSPITAL_COMMUNITY): Payer: Medicare Other

## 2019-01-23 ENCOUNTER — Encounter (HOSPITAL_COMMUNITY): Admission: EM | Disposition: A | Discharge status: Home / Self Care | Payer: Self-pay | Source: Home / Self Care

## 2019-01-23 DIAGNOSIS — N181 Chronic kidney disease, stage 1: Secondary | ICD-10-CM | POA: Diagnosis not present

## 2019-01-23 DIAGNOSIS — I129 Hypertensive chronic kidney disease with stage 1 through stage 4 chronic kidney disease, or unspecified chronic kidney disease: Secondary | ICD-10-CM | POA: Diagnosis not present

## 2019-01-23 DIAGNOSIS — Z794 Long term (current) use of insulin: Secondary | ICD-10-CM | POA: Diagnosis not present

## 2019-01-23 DIAGNOSIS — R1031 Right lower quadrant pain: Secondary | ICD-10-CM | POA: Diagnosis not present

## 2019-01-23 DIAGNOSIS — G4730 Sleep apnea, unspecified: Secondary | ICD-10-CM | POA: Diagnosis not present

## 2019-01-23 DIAGNOSIS — Z791 Long term (current) use of non-steroidal anti-inflammatories (NSAID): Secondary | ICD-10-CM | POA: Diagnosis not present

## 2019-01-23 DIAGNOSIS — Z1159 Encounter for screening for other viral diseases: Secondary | ICD-10-CM | POA: Diagnosis not present

## 2019-01-23 DIAGNOSIS — Z743 Need for continuous supervision: Secondary | ICD-10-CM | POA: Diagnosis not present

## 2019-01-23 DIAGNOSIS — Z8551 Personal history of malignant neoplasm of bladder: Secondary | ICD-10-CM | POA: Diagnosis not present

## 2019-01-23 DIAGNOSIS — R52 Pain, unspecified: Secondary | ICD-10-CM | POA: Diagnosis not present

## 2019-01-23 DIAGNOSIS — N200 Calculus of kidney: Secondary | ICD-10-CM | POA: Diagnosis not present

## 2019-01-23 DIAGNOSIS — R279 Unspecified lack of coordination: Secondary | ICD-10-CM | POA: Diagnosis not present

## 2019-01-23 DIAGNOSIS — N202 Calculus of kidney with calculus of ureter: Secondary | ICD-10-CM | POA: Diagnosis not present

## 2019-01-23 DIAGNOSIS — F17210 Nicotine dependence, cigarettes, uncomplicated: Secondary | ICD-10-CM | POA: Diagnosis not present

## 2019-01-23 DIAGNOSIS — Z8546 Personal history of malignant neoplasm of prostate: Secondary | ICD-10-CM | POA: Diagnosis not present

## 2019-01-23 DIAGNOSIS — E785 Hyperlipidemia, unspecified: Secondary | ICD-10-CM | POA: Diagnosis not present

## 2019-01-23 DIAGNOSIS — E1122 Type 2 diabetes mellitus with diabetic chronic kidney disease: Secondary | ICD-10-CM | POA: Diagnosis not present

## 2019-01-23 HISTORY — PX: CYSTOSCOPY WITH RETROGRADE PYELOGRAM, URETEROSCOPY AND STENT PLACEMENT: SHX5789

## 2019-01-23 HISTORY — PX: HOLMIUM LASER APPLICATION: SHX5852

## 2019-01-23 LAB — SARS CORONAVIRUS 2 BY RT PCR (HOSPITAL ORDER, PERFORMED IN ~~LOC~~ HOSPITAL LAB): SARS Coronavirus 2: NEGATIVE

## 2019-01-23 LAB — GLUCOSE, CAPILLARY
Glucose-Capillary: 221 mg/dL — ABNORMAL HIGH (ref 70–99)
Glucose-Capillary: 214 mg/dL — ABNORMAL HIGH (ref 70–99)

## 2019-01-23 SURGERY — CYSTOURETEROSCOPY, WITH RETROGRADE PYELOGRAM AND STENT INSERTION
Anesthesia: General | Site: Ureter | Laterality: Right

## 2019-01-23 MED ORDER — CEFAZOLIN SODIUM-DEXTROSE 2-4 GM/100ML-% IV SOLN
2.0000 g | INTRAVENOUS | Status: AC
  Administered 2019-01-23: 2 g via INTRAVENOUS
  Filled 2019-01-23: qty 100

## 2019-01-23 MED ORDER — HYDROCODONE-ACETAMINOPHEN 5-325 MG PO TABS: 1.0000 | ORAL_TABLET | Freq: Four times a day (QID) | ORAL | 0 refills | Status: DC | PRN

## 2019-01-23 MED ORDER — LACTATED RINGERS IV SOLN
INTRAVENOUS | Status: DC
  Administered 2019-01-23: 16:00:00 via INTRAVENOUS

## 2019-01-23 MED ORDER — SODIUM CHLORIDE 0.9 % IR SOLN
Status: DC | PRN
  Administered 2019-01-23: 6000 mL via INTRAVESICAL

## 2019-01-23 MED ORDER — HYDROMORPHONE HCL 1 MG/ML IJ SOLN
1.0000 mg | Freq: Once | INTRAMUSCULAR | Status: AC
  Administered 2019-01-23: 1 mg via INTRAVENOUS
  Filled 2019-01-23: qty 1

## 2019-01-23 MED ORDER — PROPOFOL 10 MG/ML IV BOLUS
INTRAVENOUS | Status: DC | PRN
  Administered 2019-01-23: 200 mg via INTRAVENOUS

## 2019-01-23 MED ORDER — ONDANSETRON HCL 4 MG/2ML IJ SOLN
4.0000 mg | Freq: Once | INTRAMUSCULAR | Status: AC
  Administered 2019-01-23: 4 mg via INTRAVENOUS
  Filled 2019-01-23: qty 2

## 2019-01-23 MED ORDER — DEXAMETHASONE SODIUM PHOSPHATE 10 MG/ML IJ SOLN
INTRAMUSCULAR | Status: AC
  Filled 2019-01-23: qty 1

## 2019-01-23 MED ORDER — FENTANYL CITRATE (PF) 100 MCG/2ML IJ SOLN
INTRAMUSCULAR | Status: DC | PRN
  Administered 2019-01-23 (×2): 50 ug via INTRAVENOUS

## 2019-01-23 MED ORDER — MIDAZOLAM HCL 5 MG/5ML IJ SOLN
INTRAMUSCULAR | Status: DC | PRN
  Administered 2019-01-23 (×2): 1 mg via INTRAVENOUS

## 2019-01-23 MED ORDER — LACTATED RINGERS IV SOLN: INTRAVENOUS | Status: DC

## 2019-01-23 MED ORDER — ACETAMINOPHEN 325 MG PO TABS: 650.0000 mg | ORAL_TABLET | ORAL | Status: DC | PRN

## 2019-01-23 MED ORDER — MIDAZOLAM HCL 2 MG/2ML IJ SOLN
INTRAMUSCULAR | Status: AC
  Filled 2019-01-23: qty 2

## 2019-01-23 MED ORDER — PROPOFOL 10 MG/ML IV BOLUS
INTRAVENOUS | Status: AC
  Filled 2019-01-23: qty 20

## 2019-01-23 MED ORDER — ONDANSETRON HCL 4 MG/2ML IJ SOLN: 4.0000 mg | Freq: Once | INTRAMUSCULAR | Status: DC | PRN

## 2019-01-23 MED ORDER — HYDROCODONE-ACETAMINOPHEN 5-325 MG PO TABS
2.0000 | ORAL_TABLET | Freq: Once | ORAL | Status: AC
  Administered 2019-01-23: 2 via ORAL
  Filled 2019-01-23: qty 2

## 2019-01-23 MED ORDER — ONDANSETRON HCL 4 MG/2ML IJ SOLN
INTRAMUSCULAR | Status: DC | PRN
  Administered 2019-01-23: 4 mg via INTRAVENOUS

## 2019-01-23 MED ORDER — FENTANYL CITRATE (PF) 100 MCG/2ML IJ SOLN
50.0000 ug | Freq: Once | INTRAMUSCULAR | Status: AC
  Administered 2019-01-23: 50 ug via INTRAVENOUS
  Filled 2019-01-23: qty 2

## 2019-01-23 MED ORDER — LIDOCAINE 2% (20 MG/ML) 5 ML SYRINGE
INTRAMUSCULAR | Status: DC | PRN
  Administered 2019-01-23: 100 mg via INTRAVENOUS

## 2019-01-23 MED ORDER — ONDANSETRON HCL 4 MG/2ML IJ SOLN
INTRAMUSCULAR | Status: AC
  Filled 2019-01-23: qty 2

## 2019-01-23 MED ORDER — CEPHALEXIN 500 MG PO CAPS: 500.0000 mg | ORAL_CAPSULE | Freq: Four times a day (QID) | ORAL | 0 refills | Status: AC

## 2019-01-23 MED ORDER — METOCLOPRAMIDE HCL 5 MG/ML IJ SOLN
10.0000 mg | Freq: Once | INTRAMUSCULAR | Status: AC
  Administered 2019-01-23: 10 mg via INTRAVENOUS
  Filled 2019-01-23: qty 2

## 2019-01-23 MED ORDER — SODIUM CHLORIDE 0.9 % IV BOLUS (SEPSIS)
1000.0000 mL | Freq: Once | INTRAVENOUS | Status: AC
  Administered 2019-01-23: 1000 mL via INTRAVENOUS

## 2019-01-23 MED ORDER — SODIUM CHLORIDE 0.9 % IV SOLN: 250.0000 mL | INTRAVENOUS | Status: DC | PRN

## 2019-01-23 MED ORDER — IOHEXOL 300 MG/ML  SOLN
INTRAMUSCULAR | Status: DC | PRN
  Administered 2019-01-23: 7 mL via INTRAVENOUS

## 2019-01-23 MED ORDER — FENTANYL CITRATE (PF) 100 MCG/2ML IJ SOLN: 25.0000 ug | INTRAMUSCULAR | Status: DC | PRN

## 2019-01-23 MED ORDER — LACTATED RINGERS IV SOLN
INTRAVENOUS | Status: DC | PRN
  Administered 2019-01-23: 18:00:00 via INTRAVENOUS

## 2019-01-23 MED ORDER — SODIUM CHLORIDE 0.9% FLUSH: 3.0000 mL | INTRAVENOUS | Status: DC | PRN

## 2019-01-23 MED ORDER — 0.9 % SODIUM CHLORIDE (POUR BTL) OPTIME
TOPICAL | Status: DC | PRN
  Administered 2019-01-23: 1000 mL

## 2019-01-23 MED ORDER — ONDANSETRON 4 MG PO TBDP
4.0000 mg | ORAL_TABLET | Freq: Once | ORAL | Status: AC
  Administered 2019-01-23: 4 mg via ORAL
  Filled 2019-01-23: qty 1

## 2019-01-23 MED ORDER — ONDANSETRON HCL 4 MG PO TABS: 4.0000 mg | ORAL_TABLET | Freq: Four times a day (QID) | ORAL | 1 refills | Status: DC | PRN

## 2019-01-23 MED ORDER — LIDOCAINE 2% (20 MG/ML) 5 ML SYRINGE
INTRAMUSCULAR | Status: AC
  Filled 2019-01-23: qty 5

## 2019-01-23 MED ORDER — DEXAMETHASONE SODIUM PHOSPHATE 10 MG/ML IJ SOLN
INTRAMUSCULAR | Status: DC | PRN
  Administered 2019-01-23: 4 mg via INTRAVENOUS

## 2019-01-23 MED ORDER — ACETAMINOPHEN 650 MG RE SUPP
650.0000 mg | RECTAL | Status: DC | PRN
  Filled 2019-01-23: qty 1

## 2019-01-23 MED ORDER — FENTANYL CITRATE (PF) 100 MCG/2ML IJ SOLN
INTRAMUSCULAR | Status: AC
  Filled 2019-01-23: qty 2

## 2019-01-23 MED ORDER — SODIUM CHLORIDE 0.9% FLUSH: 3.0000 mL | Freq: Two times a day (BID) | INTRAVENOUS | Status: DC

## 2019-01-23 SURGICAL SUPPLY — 26 items
BAG URO CATCHER STRL LF (MISCELLANEOUS) ×2 IMPLANT
BASKET STONE 1.7 NGAGE (UROLOGICAL SUPPLIES) ×1 IMPLANT
BASKET STONE NCOMPASS (UROLOGICAL SUPPLIES) IMPLANT
CATH URET 5FR 28IN OPEN ENDED (CATHETERS) ×1 IMPLANT
CATH URET DUAL LUMEN 6-10FR 50 (CATHETERS) ×1 IMPLANT
CLOTH BEACON ORANGE TIMEOUT ST (SAFETY) ×1 IMPLANT
COVER WAND RF STERILE (DRAPES) IMPLANT
EXTRACTOR STONE NITINOL NGAGE (UROLOGICAL SUPPLIES) ×2 IMPLANT
FIBER LASER FLEXIVA 1000 (UROLOGICAL SUPPLIES) IMPLANT
FIBER LASER FLEXIVA 365 (UROLOGICAL SUPPLIES) IMPLANT
FIBER LASER FLEXIVA 550 (UROLOGICAL SUPPLIES) IMPLANT
FIBER LASER TRAC TIP (UROLOGICAL SUPPLIES) ×1 IMPLANT
GLOVE SURG SS PI 8.0 STRL IVOR (GLOVE) ×1 IMPLANT
GOWN STRL REUS W/TWL XL LVL3 (GOWN DISPOSABLE) ×2 IMPLANT
GUIDEWIRE STR DUAL SENSOR (WIRE) ×2 IMPLANT
IV NS 1000ML (IV SOLUTION)
IV NS 1000ML BAXH (IV SOLUTION) ×1 IMPLANT
IV NS IRRIG 3000ML ARTHROMATIC (IV SOLUTION) ×1 IMPLANT
KIT TURNOVER KIT A (KITS) ×1 IMPLANT
MANIFOLD NEPTUNE II (INSTRUMENTS) ×2 IMPLANT
PACK CYSTO (CUSTOM PROCEDURE TRAY) ×2 IMPLANT
SHEATH URETERAL 12FRX35CM (MISCELLANEOUS) ×2 IMPLANT
SHEATH URETERAL 12FRX55CM (UROLOGICAL SUPPLIES) ×1 IMPLANT
STENT CONTOUR 6FRX26X.038 (STENTS) ×1 IMPLANT
TUBING CONNECTING 10 (TUBING) ×2 IMPLANT
TUBING UROLOGY SET (TUBING) ×2 IMPLANT

## 2019-01-23 NOTE — Transfer of Care (Signed)
Immediate Anesthesia Transfer of Care Note  Patient: Anthony Daniel.  Procedure(s) Performed: CYSTOSCOPY WITH RIGHT RETROGRADE PYELOGRAM, URETEROSCOPY AND STENT PLACEMENT (Right Ureter) HOLMIUM LASER APPLICATION (Right Ureter)  Patient Location: PACU  Anesthesia Type:General  Level of Consciousness: awake, alert , oriented and patient cooperative  Airway & Oxygen Therapy: Patient Spontanous Breathing and Patient connected to face mask oxygen  Post-op Assessment: Report given to RN and Post -op Vital signs reviewed and stable  Post vital signs: Reviewed and stable  Last Vitals:  Vitals Value Taken Time  BP    Temp    Pulse 86 01/23/19 1953  Resp    SpO2 99 % 01/23/19 1953  Vitals shown include unvalidated device data.  Last Pain:  Vitals:   01/23/19 1530  TempSrc: Oral  PainSc: 6       Patients Stated Pain Goal: 3 (16/10/96 0454)  Complications: No apparent anesthesia complications

## 2019-01-23 NOTE — ED Notes (Signed)
Patient reports not having pain relief and requesting more pain medication at this time.

## 2019-01-23 NOTE — Anesthesia Preprocedure Evaluation (Signed)
Anesthesia Evaluation  Patient identified by MRN, date of birth, ID band Patient awake    Reviewed: Allergy & Precautions, NPO status , Patient's Chart, lab work & pertinent test results  Airway Mallampati: II  TM Distance: >3 FB Neck ROM: Full    Dental  (+) Poor Dentition, Missing   Pulmonary sleep apnea and Continuous Positive Airway Pressure Ventilation , Current Smoker,    Pulmonary exam normal breath sounds clear to auscultation       Cardiovascular hypertension, Pt. on medications Normal cardiovascular exam Rhythm:Regular Rate:Normal     Neuro/Psych negative neurological ROS  negative psych ROS   GI/Hepatic   Endo/Other  diabetes, Well Controlled, Type 2, Insulin DependentObesity Hyperlipidemia On insulin pump  Renal/GU Renal InsufficiencyRenal diseaseHx/o renal calculi  negative genitourinary   Musculoskeletal  (+) Arthritis , Osteoarthritis,  Cervical spinal stenosis   Abdominal (+) + obese,   Peds  Hematology negative hematology ROS (+)   Anesthesia Other Findings   Reproductive/Obstetrics                             Anesthesia Physical Anesthesia Plan  ASA: III  Anesthesia Plan: General   Post-op Pain Management:    Induction: Intravenous  PONV Risk Score and Plan: 3 and Ondansetron, Midazolam, Treatment may vary due to age or medical condition and Dexamethasone  Airway Management Planned: LMA  Additional Equipment:   Intra-op Plan:   Post-operative Plan: Extubation in OR  Informed Consent: I have reviewed the patients History and Physical, chart, labs and discussed the procedure including the risks, benefits and alternatives for the proposed anesthesia with the patient or authorized representative who has indicated his/her understanding and acceptance.     Dental advisory given  Plan Discussed with: CRNA  Anesthesia Plan Comments:         Anesthesia  Quick Evaluation

## 2019-01-23 NOTE — ED Notes (Signed)
urology Provider at bedside.

## 2019-01-23 NOTE — ED Notes (Signed)
[facility] will pick pt up at 2:30 to transport to [facility]

## 2019-01-23 NOTE — Consult Note (Signed)
Urology Consult Note   Requesting Attending Physician:  Ward, Delice Bison, DO Service Providing Consult: Urology  Consulting Attending: Jeffie Pollock   Reason for Consult:  Kidney stone  HPI: Anthony Daniel. is seen in consultation for reasons noted above at the request of Ward, Delice Bison, DO for evaluation of nephrolithiasis.  This is a 68 y.o. male with history of known bilateral nephrolithiasis.  Was recently treated with ureteroscopy in March 2020.  Presented to the [facility] emergency room last evening with right-sided flank pain.  CT showed a 1 cm right UPJ stone.  Also had stones on the left which are nonobstructing.  No sign of infection on urinalysis or labs.  No fevers at home or in the emergency room.  Hemodynamically stable.  Has been treated with pain medicine and feels well enough to go home.  We will plan on outpatient ureteral stent placement this afternoon.   Past Medical History: Past Medical History:  Diagnosis Date  . Arthritis    Back  . CKD (chronic kidney disease), stage I   . History of bladder cancer    1998--  TCC  . History of kidney stones   . Hyperlipidemia   . Hypertension   . Insulin pump in place   . LADA (latent autoimmune diabetes in adults), managed as type 1 [facility])    endocrinologist-- dr Buddy Duty  . Nephrolithiasis    bilateral --  nonobstructive per CT  . Spinal stenosis, cervical region     Past Surgical History:  Past Surgical History:  Procedure Laterality Date  . COLONOSCOPY  2018   x2  . CYSTOSCOPY W/ URETERAL STENT PLACEMENT Right 05/12/2015   Procedure: CYSTOSCOPY WITH STENT REPLACEMENT;  Surgeon: Cleon Gustin, MD;  Location: [facility];  Service: Urology;  Laterality: Right;  . CYSTOSCOPY WITH URETEROSCOPY AND STENT PLACEMENT Right 04/23/2015   Procedure: CYSTOSCOPY WITH URETEROSCOPY RETROGRADE PYELOGRAM AND STENT PLACEMENT;  Surgeon: Cleon Gustin, MD;  Location: [facility];  Service: Urology;  Laterality: Right;  .  CYSTOSCOPY/RETROGRADE/URETEROSCOPY/STONE EXTRACTION WITH BASKET Right 05/12/2015   Procedure: CYSTOSCOPY/RETROGRADE/URETEROSCOPY/STONE EXTRACTION WITH BASKET;  Surgeon: Cleon Gustin, MD;  Location: [facility];  Service: Urology;  Laterality: Right;  . CYSTOSCOPY/URETEROSCOPY/HOLMIUM LASER/STENT PLACEMENT Left 09/18/2018   Procedure: CYSTOSCOPY/ LEFT RETROGRADE LEFT URETEROSCOPY [place] LASER/STENT PLACEMENT;  Surgeon: Irine Seal, MD;  Location: [facility];  Service: Urology;  Laterality: Left;  . DENTAL SURGERY    . EXTRACORPOREAL SHOCK WAVE LITHOTRIPSY  x2 in  2006//   x2  in 2012  . HOLMIUM LASER APPLICATION Right 21/19/4174   Procedure: HOLMIUM LASER APPLICATION;  Surgeon: Cleon Gustin, MD;  Location: [facility];  Service: Urology;  Laterality: Right;  . LAPAROSCOPIC APPENDECTOMY N/A 11/07/2012   Procedure: APPENDECTOMY LAPAROSCOPIC;  Surgeon: Harl Bowie, MD;  Location: [facility];  Service: General;  Laterality: N/A;  . NEPHROLITHOTOMY Left 08/06/2015   Procedure: 1ST STAGE  LEFT PERCUTANEOUS NEPHROLITHOTOMY ;  Surgeon: Irine Seal, MD;  Location: [facility];  Service: Urology;  Laterality: Left;  . TONSILLECTOMY  1975  . TRANSURETHRAL RESECTION OF BLADDER TUMOR  1998    Medication: Current Facility-Administered Medications  Medication Dose Route Frequency Provider Last Rate Last Dose  . sodium chloride flush (NS) 0.9 % injection 3 mL  3 mL Intravenous Once Ward, Delice Bison, DO       Current Outpatient Medications  Medication Sig Dispense Refill  . aspirin EC 81 MG tablet  Take 81 mg by mouth daily.    . candesartan-hydrochlorothiazide (ATACAND HCT) 32-12.5 MG tablet Take 1 tablet by mouth daily. (Patient taking differently: Take 1 tablet by mouth every morning. ) 90 tablet 1  . Cholecalciferol (VITAMIN D) 2000 units tablet Take 2,000 Units by mouth daily.    Marland Kitchen ezetimibe (ZETIA) 10 MG tablet TAKE 1 TABLET BY MOUTH EVERY DAY (Patient  taking differently: every morning. TAKE 1 TABLET BY MOUTH EVERY DAY) 90 tablet 1  . fluticasone (FLONASE) 50 MCG/ACT nasal spray Place 2 sprays into both nostrils daily. (Patient taking differently: Place 2 sprays into both nostrils daily as needed for allergies. ) 16 g 3  . HYDROcodone-acetaminophen (NORCO/VICODIN) 5-325 MG tablet Take 1-2 tablets by mouth every 6 (six) hours as needed for moderate pain.     . Insulin Glargine (LANTUS SOLOSTAR [place]) Inject 15 Units into the skin 2 (two) times daily as needed. Only uses when pump goes out    . Insulin Human (INSULIN PUMP) SOLN Inject 1 each into the skin continuous. Novolog insulin    . levocetirizine (XYZAL) 5 MG tablet TAKE 1 TABLET BY MOUTH EVERY DAY IN THE EVENING (Patient taking differently: Take 5 mg by mouth daily as needed for allergies. ) 90 tablet 1  . meloxicam (MOBIC) 15 MG tablet TAKE 1 TABLET BY MOUTH EVERY DAY 90 tablet 0  . metaxalone (SKELAXIN) 400 MG tablet TAKE 1 TABLET (400 MG TOTAL) BY MOUTH 3 (THREE) TIMES DAILY. (Patient taking differently: Take 400 mg by mouth 3 (three) times daily as needed (pain). ) 270 tablet 0  . NOVOLOG 100 UNIT/ML injection Inject 60 Units into the skin See admin instructions. With pump. Through out the day    . Pitavastatin Calcium (LIVALO) 2 MG TABS Take 1 tablet (2 mg total) by mouth every morning. 90 tablet 1  . Polyethyl Glycol-Propyl Glycol (SYSTANE OP) Place 1 drop into both eyes every 8 (eight) hours as needed (dry eyes).    . Potassium Citrate 15 MEQ (1620 MG) TBCR Take 1 tablet by mouth 2 (two) times daily.    . sildenafil (REVATIO) 20 MG tablet Take 20 mg by mouth daily as needed (erectile dysfunction).    . tamsulosin (FLOMAX) 0.4 MG CAPS capsule Take 1 capsule (0.4 mg total) by mouth daily. (Patient taking differently: Take 0.4 mg by mouth every morning. ) 30 capsule 0  . Ciclopirox 0.77 % gel APPLY TWICE A DAY TO AFFECTED AREA. (Patient not taking: Reported on 01/23/2019) 30 g 2     Allergies: Allergies  Allergen Reactions  . Altace [Ramipril] Cough  . Crestor [Rosuvastatin] Other (See Comments)    Muscle aches, cramps  . Lipitor [Atorvastatin] Other (See Comments)    Muscle aches, cramps  . Contrast Media [Iodinated Diagnostic Agents] Itching and Nausea And Vomiting    PT TRIED THIS TWICE.EVEN WITH PRE-MEDS AND STILL HAD NAUSEA AND ITCHING--WAS TOLD HE CAN NOT TAKE iv ANYMORE  . Latex Other (See Comments)    Irritation  . Oxycodone Nausea Only    Pt can tolerate hydrocodone in small doses only!  . Adhesive [Tape] Rash  . Peanut-Containing Drug Products Anxiety and Other (See Comments)    Jittery    Social History: Social History   Tobacco Use  . Smoking status: Current Every Day Smoker    Packs/day: 0.50    Years: 30.00    Pack years: 15.00    Types: Cigarettes  . Smokeless tobacco: Never Used  Substance Use  Topics  . Alcohol use: Yes    Alcohol/week: 5.0 standard drinks    Types: 5 Shots of liquor per week    Comment: occasional  . Drug use: No    Family History Family History  Problem Relation Age of Onset  . Cancer Other        Colon and Prostate Cancer  . Hypertension Other   . Diabetes Other   . Alcohol abuse Neg Hx   . Drug abuse Neg Hx   . Early death Neg Hx   . Heart disease Neg Hx   . Hyperlipidemia Neg Hx   . Kidney disease Neg Hx   . Stroke Neg Hx     Review of Systems 10 systems were reviewed and are negative except as noted specifically in the HPI.  Objective   Vital signs in last 24 hours: BP (!) 146/70   Pulse 72   Temp 98.9 F (37.2 C) (Oral)   Resp 16   SpO2 99%   Physical Exam General: NAD, A&O, resting, appropriate HEENT: [place]/AT, EOMI, MMM Pulmonary: Normal work of breathing Cardiovascular: HDS, adequate peripheral perfusion Abdomen: Soft, NTTP, nondistended, . GU:  Minimal right CVA tenderness Extremities: warm and well perfused Neuro: Appropriate, no focal neurological deficits  Most Recent  Labs: Lab Results  Component Value Date   WBC 12.3 (H) 01/22/2019   HGB 14.1 01/22/2019   HCT 42.6 01/22/2019   PLT 172 01/22/2019    Lab Results  Component Value Date   NA 136 01/22/2019   K 4.6 01/22/2019   CL 103 01/22/2019   CO2 24 01/22/2019   BUN 16 01/22/2019   CREATININE 1.37 (H) 01/22/2019   CALCIUM 8.7 (L) 01/22/2019    Lab Results  Component Value Date   INR 1.02 08/06/2015   APTT 31 08/06/2015     IMAGING: Ct Renal Stone Study  Result Date: 01/23/2019 CLINICAL DATA:  Right flank pain and right lower quadrant pain with nausea. History of kidney stones. EXAM: CT ABDOMEN AND PELVIS WITHOUT CONTRAST TECHNIQUE: Multidetector CT imaging of the abdomen and pelvis was performed following the standard protocol without IV contrast. COMPARISON:  CT scan dated 01/01/2018 FINDINGS: Lower chest: Aortic atherosclerosis. No acute abnormality. Hepatobiliary: No focal liver abnormality is seen. No gallstones, gallbladder wall thickening, or biliary dilatation. Pancreas: Unremarkable. No pancreatic ductal dilatation or surrounding inflammatory changes. Spleen: Normal in size without focal abnormality. Adrenals/Urinary Tract: There is a 10 mm stone obstructing the right ureteral pelvic junction. Multiple bilateral renal calculi. Slight right perinephric soft tissue stranding consistent with urine extravasation. No left hydronephrosis. Distal right ureter is not distended. Adrenal glands are normal. Bladder is normal. Stomach/Bowel: Stomach is within normal limits. Appendix is not visualized. There appears to be a tiny clip at the tip of the cecum suggesting previous appendectomy. No evidence of bowel wall thickening, distention, or inflammatory changes. Vascular/Lymphatic: Aortic atherosclerosis. No enlarged abdominal or pelvic lymph nodes. Reproductive: Prostate is unremarkable. Other: No abdominal wall hernia or abnormality. No abdominopelvic ascites. Musculoskeletal: No acute or significant  osseous findings. IMPRESSION: 1. 10 mm stone obstructing the right ureteral pelvic junction. Slight right perinephric soft tissue stranding consistent with urine extravasation. 2. Multiple bilateral renal calculi. 3. Aortic atherosclerosis. Aortic Atherosclerosis (ICD10-I70.0). Electronically Signed   By: Lorriane Shire M.D.   On: 01/23/2019 05:19    ------  Assessment:  68 y.o. male with noninfected right-sided obstructing stone and nonobstructing left-sided stones.  Discussed the risk and benefits of ureteral stent  placement to resolve symptoms.  He would like to proceed.  Feels well enough to go home and return for surgery as an outpatient   Recommendations: -Okay to discharge from the emergency room from urology perspective -Patient to return at 3 PM to [facility] long for surgery -Should remain n.p.o.. Okay for clear liquids until noon   Thank you for this consult. Please contact the urology consult pager with any further questions/concerns.

## 2019-01-23 NOTE — Anesthesia Procedure Notes (Signed)
Procedure Name: LMA Insertion Date/Time: 01/23/2019 6:11 PM Performed by: Montel Clock, CRNA Pre-anesthesia Checklist: Patient identified, Emergency Drugs available, Suction available, Patient being monitored and Timeout performed Patient Re-evaluated:Patient Re-evaluated prior to induction Oxygen Delivery Method: Circle system utilized Preoxygenation: Pre-oxygenation with 100% oxygen Induction Type: IV induction LMA: LMA with gastric port inserted LMA Size: 4.0 Number of attempts: 1 Dental Injury: Teeth and Oropharynx as per pre-operative assessment

## 2019-01-23 NOTE — Anesthesia Postprocedure Evaluation (Signed)
Anesthesia Post Note  Patient: Anthony Daniel.  Procedure(s) Performed: CYSTOSCOPY WITH RIGHT RETROGRADE PYELOGRAM, URETEROSCOPY AND STENT PLACEMENT (Right Ureter) HOLMIUM LASER APPLICATION (Right Ureter)     Patient location during evaluation: PACU Anesthesia Type: General Level of consciousness: awake and alert Pain management: pain level controlled Vital Signs Assessment: post-procedure vital signs reviewed and stable Respiratory status: spontaneous breathing, nonlabored ventilation, respiratory function stable and patient connected to nasal cannula oxygen Cardiovascular status: blood pressure returned to baseline and stable Postop Assessment: no apparent nausea or vomiting Anesthetic complications: no    Last Vitals:  Vitals:   01/23/19 2015 01/23/19 2030  BP: (!) 158/52 (!) 149/91  Pulse: 78 79  Resp: 14 18  Temp:    SpO2: 95% 95%    Last Pain:  Vitals:   01/23/19 2030  TempSrc:   PainSc: 0-No pain                 Lidia Collum

## 2019-01-23 NOTE — ED Provider Notes (Signed)
Patient has poorly controlled symptoms with ongoing pain, nausea, vomiting, in spite of additional analgesia and antiemetics. I discussed this case with our urology colleagues. With planned procedure this afternoon he will be transferred to our affiliated facility.   Carmin Muskrat, MD 01/23/19 1255

## 2019-01-23 NOTE — ED Notes (Signed)
Patient transported to CT 

## 2019-01-23 NOTE — ED Provider Notes (Signed)
TIME SEEN: 4:16 AM  CHIEF COMPLAINT: Right flank pain  HPI: Patient is a 68 year old male with history of bladder cancer, prostate cancer, hypertension, hyperlipidemia, kidney stones, chronic kidney disease who presents to the emergency department right flank pain that started suddenly at 7 PM.  Described as waxing and waning, sharp and severe.  Took hydrocodone at home with relief.  Had nausea but no vomiting, diarrhea.  No fevers or chills.  No dysuria or gross hematuria.  Reports he has had multiple surgeries for his kidney stones.  Had appendectomy.  No other abdominal surgery.  ROS: See HPI Constitutional: no fever  Eyes: no drainage  ENT: no runny nose   Cardiovascular:  no chest pain  Resp: no SOB  GI: no vomiting GU: no dysuria Integumentary: no rash  Allergy: no hives  Musculoskeletal: no leg swelling  Neurological: no slurred speech ROS otherwise negative  PAST MEDICAL HISTORY/PAST SURGICAL HISTORY:  Past Medical History:  Diagnosis Date  . Arthritis    Back  . CKD (chronic kidney disease), stage I   . History of bladder cancer    1998--  TCC  . History of kidney stones   . Hyperlipidemia   . Hypertension   . Insulin pump in place   . LADA (latent autoimmune diabetes in adults), managed as type 1 [facility])    endocrinologist-- dr Buddy Duty  . Nephrolithiasis    bilateral --  nonobstructive per CT  . Spinal stenosis, cervical region     MEDICATIONS:  Prior to Admission medications   Medication Sig Start Date End Date Taking? Authorizing Provider  aspirin EC 81 MG tablet Take 81 mg by mouth daily.    [provider]  BD INSULIN SYRINGE U/F 30G X 1/2" 0.5 ML MISC  06/23/18   [provider]  candesartan-hydrochlorothiazide (ATACAND HCT) 32-12.5 MG tablet Take 1 tablet by mouth daily. Patient taking differently: Take 1 tablet by mouth every morning.  09/06/18   Janith Lima, MD  Cholecalciferol (VITAMIN D) 2000 units tablet Take 2,000 Units by mouth  daily.    [provider]  Ciclopirox 0.77 % gel APPLY TWICE A DAY TO AFFECTED AREA. 11/19/18   Janith Lima, MD  ezetimibe (ZETIA) 10 MG tablet TAKE 1 TABLET BY MOUTH EVERY DAY Patient taking differently: every morning. TAKE 1 TABLET BY MOUTH EVERY DAY 05/02/18   Janith Lima, MD  fluticasone [facility]) 50 MCG/ACT nasal spray Place 2 sprays into both nostrils daily. Patient taking differently: Place 2 sprays into both nostrils daily as needed.  08/07/18   Marrian Salvage, FNP  HYDROcodone-acetaminophen (NORCO/VICODIN) 5-325 MG tablet  07/25/18   [provider]  Insulin Glargine (LANTUS SOLOSTAR [place]) Inject 15 Units into the skin 2 (two) times daily as needed. Only uses when pump goes out    [provider]  Insulin Human (INSULIN PUMP) SOLN Inject 1 each into the skin continuous. Novolog insulin    [provider]  levocetirizine (XYZAL) 5 MG tablet TAKE 1 TABLET BY MOUTH EVERY DAY IN THE EVENING 09/14/18   Janith Lima, MD  meloxicam (MOBIC) 15 MG tablet TAKE 1 TABLET BY MOUTH EVERY DAY 11/21/18   Janith Lima, MD  metaxalone (SKELAXIN) 400 MG tablet TAKE 1 TABLET (400 MG TOTAL) BY MOUTH 3 (THREE) TIMES DAILY. 12/31/18   Janith Lima, MD  NOVOLOG 100 UNIT/ML injection  12/15/15   [provider]  nystatin (MYCOSTATIN/NYSTOP) powder  06/01/18   [provider]  ONETOUCH VERIO test strip  06/01/18   [provider]  Pitavastatin Calcium (LIVALO) 2 MG TABS Take 1 tablet (2 mg total) by mouth every morning. 10/03/18   Janith Lima, MD  Polyethyl Glycol-Propyl Glycol (SYSTANE OP) Place 1 drop into both eyes every 8 (eight) hours as needed (dry eyes).    [provider]  Potassium Citrate 15 MEQ (1620 MG) TBCR Take 1 tablet by mouth 2 (two) times daily.    [provider]  sildenafil (REVATIO) 20 MG tablet Take 20 mg by mouth daily as needed (erectile dysfunction).    [provider]  tamsulosin  (FLOMAX) 0.4 MG CAPS capsule Take 1 capsule (0.4 mg total) by mouth daily. Patient taking differently: Take 0.4 mg by mouth every morning.  04/22/15   Carmin Muskrat, MD    ALLERGIES:  Allergies  Allergen Reactions  . Altace [Ramipril] Cough  . Crestor [Rosuvastatin] Other (See Comments)    Muscle aches, cramps  . Lipitor [Atorvastatin] Other (See Comments)    Muscle aches, cramps  . Contrast Media [Iodinated Diagnostic Agents] Itching and Nausea And Vomiting    PT TRIED THIS TWICE.EVEN WITH PRE-MEDS AND STILL HAD NAUSEA AND ITCHING--WAS TOLD HE CAN NOT TAKE iv ANYMORE  . Latex Other (See Comments)    Irritation  . Oxycodone Nausea Only    Pt can tolerate hydrocodone in small doses only!  . Adhesive [Tape] Rash  . Peanut-Containing Drug Products Anxiety and Other (See Comments)    Jittery    SOCIAL HISTORY:  Social History   Tobacco Use  . Smoking status: Current Every Day Smoker    Packs/day: 0.50    Years: 30.00    Pack years: 15.00    Types: Cigarettes  . Smokeless tobacco: Never Used  Substance Use Topics  . Alcohol use: Yes    Alcohol/week: 5.0 standard drinks    Types: 5 Shots of liquor per week    Comment: occasional    FAMILY HISTORY: Family History  Problem Relation Age of Onset  . Cancer Other        Colon and Prostate Cancer  . Hypertension Other   . Diabetes Other   . Alcohol abuse Neg Hx   . Drug abuse Neg Hx   . Early death Neg Hx   . Heart disease Neg Hx   . Hyperlipidemia Neg Hx   . Kidney disease Neg Hx   . Stroke Neg Hx     EXAM: BP (!) 151/90 (BP Location: Right Arm)   Pulse 68   Temp 98.9 F (37.2 C) (Oral)   Resp 16   SpO2 99%  CONSTITUTIONAL: Alert and oriented and responds appropriately to questions. Well-appearing; well-nourished HEAD: Normocephalic EYES: Conjunctivae clear, pupils appear equal, EOMI ENT: normal nose; moist mucous membranes NECK: Supple, no meningismus, no nuchal rigidity, no LAD  CARD: RRR; S1 and S2  appreciated; no murmurs, no clicks, no rubs, no gallops RESP: Normal chest excursion without splinting or tachypnea; breath sounds clear and equal bilaterally; no wheezes, no rhonchi, no rales, no hypoxia or respiratory distress, speaking full sentences ABD/GI: Normal bowel sounds; non-distended; soft, non-tender, no rebound, no guarding, no peritoneal signs, no hepatosplenomegaly BACK:  The back appears normal and is non-tender to palpation, there is no CVA tenderness EXT: Normal ROM in all joints; non-tender to palpation; no edema; normal capillary refill; no cyanosis, no calf tenderness or swelling    SKIN: Normal color for age and race; warm; no  rash NEURO: Moves all extremities equally PSYCH: The patient's mood and manner are appropriate. Grooming and personal hygiene are appropriate.  MEDICAL DECISION MAKING: Patient with right flank pain.  States it feels like his previous kidney stones.  His labs show mild leukocytosis which may be reactive.  He has elevation of his creatinine which appears slightly elevated from previous.  Urine shows blood but no sign of infection.  Reports he has had surgery for several stones in the past.  Will obtain imaging today.  Pain is been well controlled with Vicodin.  Nausea well controlled with Zofran.  ED PROGRESS: Patient CT scan shows a 10 mm obstructing stone at the right UPJ with right perinephric soft tissue stranding consistent with urine extravasation.  Pain is now back and uncontrolled after oral medicine.  Will give IV medications and IV fluids.  Will discuss with urology on-call.   5:40 AM  Discussed with Dr. Anthony Sar with urology.  He will see patient in the emergency department.  We will keep him n.p.o. at this time.  6:10 AM  Urology has seen patient and will discuss with the urology team.  Patient would prefer admission to "go ahead and get this taken care of" rather than go home with continued pain.  We will go ahead and order a COVID  swab.   Signed out to oncoming ED physician to follow-up on neurology recommendations.   I reviewed all nursing notes, vitals, pertinent previous records, EKGs, lab and urine results, imaging (as available).     Natalyia Innes, Delice Bison, DO 01/23/19 [phone]

## 2019-01-23 NOTE — Op Note (Signed)
Procedure: 1.  Cystoscopy with right retrograde pyelogram and interpretation. 2.  Right ureteroscopy with holmium laser application and placement of right double-J stent.  Preop diagnosis: 1 cm right UPJ stone with multiple right renal stones.  Postop diagnosis: Same.  Surgeon: Dr. Irine Seal.  Anesthesia: General.  Specimen: Stone fragments.  Drain: 6 [place] by 26 cm contour double-J stent without tether.  EBL: Minimal.  Complications: None.  Indications: Anthony Daniel is a 68 year old male with a history of recurrent urolithiasis who presented to the [facility] emergency room this morning with right flank pain.  He was found to have a 1 cm obstructing right proximal ureteral stone with additional renal stones.  After reviewing the options he is elected to undergo ureteroscopy.  Procedure: He was given 2 g of Ancef.  A general anesthetic was induced.  He was placed in lithotomy position and fitted with PAS hose.  He was prepped with Hibiclens and draped in usual sterile fashion.  Cystoscopy was performed using the 23 [place] scope and 30 degree lens.  Examination revealed a normal urethra.  The external sphincter was intact.  The prostatic urethra had bilobar hyperplasia with mild obstruction and was approximately 2 to 3 cm in length.  The bladder wall had normal mucosa.  There was mild trabeculation.  There were a few small granules suggestive of uric acid stones in the bladder.  The ureteral orifices were unremarkable.  A right retrograde pyelogram was performed with a 5 French opening catheter and Omnipaque.  The right retrograde pyelogram demonstrated a normal ureter up to the level of the UPJ for a filling defect consistent with a stone was noted I did not push contrast by the stone because of his history of contrast allergy.  After completion of the retrograde pyelogram a sensor guidewire was advanced to the kidney and the UPJ stone was bounced back into the kidney.  The stones were  faintly visible on fluoroscopy.  The 12 French inner core of a 55 cm digital access sheath was passed over the wire to the level of the UPJ without difficulty.  The assembled sheath was then inserted to the UPJ with only mild resistance.  The inner core and wire were removed.  The dual-lumen digital flexible scope was passed to the kidney and the stones were identified.  There was a little bit of debris that appeared most consistent with old blood within the collecting system.  The stone from the UPJ was identified in the lower pole and was engaged with a 200 m tract tip laser fiber initially set on 0.5 J and 10 Hz with the with the frequency increased to 50 Hz to aid fragmentation.  As the stone was fragmented manageable fragments were removed with the engage basket as they were produced.  After the larger stone had been fragmented and removed inspection of the remaining calyces demonstrated several other small to medium sized stones which were removed and fragmented as needed.  On final inspection there was some minor grit remaining within the collecting system but no significant residual stones were seen with ureteroscopy or on fluoroscopy.  Ureteroscope was removed.  A sensor wire was then replaced to the kidney under fluoroscopic guidance and the access sheath was gently removed.  The cystoscope was then reinserted over the wire and a 6 [place] by 26 cm contour double-J stent without tether was passed to the kidney under fluoroscopic guidance.  The wire was removed, leaving a good coil in the kidney and a  good coil in the bladder.  The patient's bladder was drained and the cystoscope was removed.  He was taken down from lithotomy position, his anesthetic was reversed and he was moved recovery in stable condition.  There were no complications.  He will be sent home with the stone fragments to bring to the office and will be scheduled for a week from now for stent removal in the office.

## 2019-01-24 ENCOUNTER — Encounter (HOSPITAL_COMMUNITY): Payer: Self-pay | Admitting: Urology

## 2019-01-30 DIAGNOSIS — C61 Malignant neoplasm of prostate: Secondary | ICD-10-CM | POA: Diagnosis not present

## 2019-01-30 DIAGNOSIS — N200 Calculus of kidney: Secondary | ICD-10-CM | POA: Diagnosis not present

## 2019-02-13 ENCOUNTER — Other Ambulatory Visit: Payer: Self-pay | Admitting: Internal Medicine

## 2019-02-13 DIAGNOSIS — E139 Other specified diabetes mellitus without complications: Secondary | ICD-10-CM

## 2019-02-13 DIAGNOSIS — I10 Essential (primary) hypertension: Secondary | ICD-10-CM

## 2019-02-14 ENCOUNTER — Other Ambulatory Visit: Payer: Self-pay | Admitting: Internal Medicine

## 2019-02-14 DIAGNOSIS — M5030 Other cervical disc degeneration, unspecified cervical region: Secondary | ICD-10-CM

## 2019-02-19 ENCOUNTER — Other Ambulatory Visit: Payer: Self-pay | Admitting: Internal Medicine

## 2019-02-19 DIAGNOSIS — E139 Other specified diabetes mellitus without complications: Secondary | ICD-10-CM

## 2019-02-19 DIAGNOSIS — I10 Essential (primary) hypertension: Secondary | ICD-10-CM

## 2019-02-19 MED ORDER — INDAPAMIDE 1.25 MG PO TABS: 1.2500 mg | ORAL_TABLET | Freq: Every day | ORAL | 0 refills | Status: DC

## 2019-02-19 MED ORDER — IRBESARTAN 150 MG PO TABS: 150.0000 mg | ORAL_TABLET | Freq: Every day | ORAL | 1 refills | Status: DC

## 2019-02-20 ENCOUNTER — Encounter: Payer: Self-pay | Admitting: Internal Medicine

## 2019-02-20 ENCOUNTER — Other Ambulatory Visit: Payer: Self-pay | Admitting: Internal Medicine

## 2019-02-20 DIAGNOSIS — M5412 Radiculopathy, cervical region: Secondary | ICD-10-CM

## 2019-02-20 DIAGNOSIS — M5030 Other cervical disc degeneration, unspecified cervical region: Secondary | ICD-10-CM

## 2019-02-21 ENCOUNTER — Encounter: Payer: Self-pay | Admitting: Internal Medicine

## 2019-02-27 DIAGNOSIS — C61 Malignant neoplasm of prostate: Secondary | ICD-10-CM | POA: Diagnosis not present

## 2019-02-27 LAB — PSA: PSA: 4.89

## 2019-03-06 DIAGNOSIS — R972 Elevated prostate specific antigen [PSA]: Secondary | ICD-10-CM | POA: Diagnosis not present

## 2019-03-06 DIAGNOSIS — N5201 Erectile dysfunction due to arterial insufficiency: Secondary | ICD-10-CM | POA: Diagnosis not present

## 2019-03-06 DIAGNOSIS — C61 Malignant neoplasm of prostate: Secondary | ICD-10-CM | POA: Diagnosis not present

## 2019-03-06 DIAGNOSIS — N401 Enlarged prostate with lower urinary tract symptoms: Secondary | ICD-10-CM | POA: Diagnosis not present

## 2019-03-06 DIAGNOSIS — N200 Calculus of kidney: Secondary | ICD-10-CM | POA: Diagnosis not present

## 2019-03-06 DIAGNOSIS — R3121 Asymptomatic microscopic hematuria: Secondary | ICD-10-CM | POA: Diagnosis not present

## 2019-03-09 ENCOUNTER — Other Ambulatory Visit: Payer: Self-pay | Admitting: Internal Medicine

## 2019-03-09 DIAGNOSIS — E785 Hyperlipidemia, unspecified: Secondary | ICD-10-CM

## 2019-03-09 DIAGNOSIS — J301 Allergic rhinitis due to pollen: Secondary | ICD-10-CM

## 2019-03-29 DIAGNOSIS — N181 Chronic kidney disease, stage 1: Secondary | ICD-10-CM | POA: Diagnosis not present

## 2019-03-29 DIAGNOSIS — E785 Hyperlipidemia, unspecified: Secondary | ICD-10-CM | POA: Diagnosis not present

## 2019-03-29 DIAGNOSIS — E1022 Type 1 diabetes mellitus with diabetic chronic kidney disease: Secondary | ICD-10-CM | POA: Diagnosis not present

## 2019-03-29 DIAGNOSIS — Z720 Tobacco use: Secondary | ICD-10-CM | POA: Diagnosis not present

## 2019-03-29 DIAGNOSIS — Z794 Long term (current) use of insulin: Secondary | ICD-10-CM | POA: Diagnosis not present

## 2019-04-02 DIAGNOSIS — E1022 Type 1 diabetes mellitus with diabetic chronic kidney disease: Secondary | ICD-10-CM | POA: Diagnosis not present

## 2019-04-02 DIAGNOSIS — Z23 Encounter for immunization: Secondary | ICD-10-CM | POA: Diagnosis not present

## 2019-04-02 DIAGNOSIS — E1165 Type 2 diabetes mellitus with hyperglycemia: Secondary | ICD-10-CM | POA: Diagnosis not present

## 2019-05-03 DIAGNOSIS — H2513 Age-related nuclear cataract, bilateral: Secondary | ICD-10-CM | POA: Diagnosis not present

## 2019-05-03 DIAGNOSIS — H43813 Vitreous degeneration, bilateral: Secondary | ICD-10-CM | POA: Diagnosis not present

## 2019-05-03 DIAGNOSIS — E103393 Type 1 diabetes mellitus with moderate nonproliferative diabetic retinopathy without macular edema, bilateral: Secondary | ICD-10-CM | POA: Diagnosis not present

## 2019-05-03 DIAGNOSIS — H3582 Retinal ischemia: Secondary | ICD-10-CM | POA: Diagnosis not present

## 2019-05-08 LAB — HM DIABETES EYE EXAM

## 2019-05-13 ENCOUNTER — Other Ambulatory Visit: Payer: Self-pay | Admitting: Internal Medicine

## 2019-05-13 DIAGNOSIS — I10 Essential (primary) hypertension: Secondary | ICD-10-CM

## 2019-06-02 ENCOUNTER — Other Ambulatory Visit: Payer: Self-pay | Admitting: Internal Medicine

## 2019-06-02 DIAGNOSIS — E785 Hyperlipidemia, unspecified: Secondary | ICD-10-CM

## 2019-06-03 ENCOUNTER — Other Ambulatory Visit: Payer: Self-pay | Admitting: Internal Medicine

## 2019-06-03 DIAGNOSIS — M5030 Other cervical disc degeneration, unspecified cervical region: Secondary | ICD-10-CM

## 2019-06-03 DIAGNOSIS — M5412 Radiculopathy, cervical region: Secondary | ICD-10-CM

## 2019-06-27 DIAGNOSIS — N181 Chronic kidney disease, stage 1: Secondary | ICD-10-CM | POA: Diagnosis not present

## 2019-06-27 DIAGNOSIS — Z794 Long term (current) use of insulin: Secondary | ICD-10-CM | POA: Diagnosis not present

## 2019-06-27 DIAGNOSIS — Z720 Tobacco use: Secondary | ICD-10-CM | POA: Diagnosis not present

## 2019-06-27 DIAGNOSIS — E785 Hyperlipidemia, unspecified: Secondary | ICD-10-CM | POA: Diagnosis not present

## 2019-06-27 DIAGNOSIS — E1022 Type 1 diabetes mellitus with diabetic chronic kidney disease: Secondary | ICD-10-CM | POA: Diagnosis not present

## 2019-07-05 LAB — HEMOGLOBIN A1C: Hemoglobin A1C: 7.5

## 2019-08-06 ENCOUNTER — Other Ambulatory Visit: Payer: Self-pay | Admitting: Internal Medicine

## 2019-08-06 DIAGNOSIS — I10 Essential (primary) hypertension: Secondary | ICD-10-CM

## 2019-08-09 ENCOUNTER — Other Ambulatory Visit: Payer: Self-pay | Admitting: Internal Medicine

## 2019-08-09 DIAGNOSIS — E139 Other specified diabetes mellitus without complications: Secondary | ICD-10-CM

## 2019-08-09 DIAGNOSIS — I10 Essential (primary) hypertension: Secondary | ICD-10-CM

## 2019-08-19 DIAGNOSIS — Z20828 Contact with and (suspected) exposure to other viral communicable diseases: Secondary | ICD-10-CM | POA: Diagnosis not present

## 2019-08-20 DIAGNOSIS — N200 Calculus of kidney: Secondary | ICD-10-CM | POA: Diagnosis not present

## 2019-08-20 DIAGNOSIS — C61 Malignant neoplasm of prostate: Secondary | ICD-10-CM | POA: Diagnosis not present

## 2019-08-21 LAB — PSA: PSA: 6.08

## 2019-08-24 ENCOUNTER — Ambulatory Visit: Payer: BC Managed Care – PPO | Attending: Internal Medicine

## 2019-08-24 DIAGNOSIS — Z23 Encounter for immunization: Secondary | ICD-10-CM | POA: Insufficient documentation

## 2019-08-24 NOTE — Progress Notes (Signed)
   [id number]  Name:  Anthony Daniel.    MRN: [id number] DOB: 08/28/1950  08/24/2019  [name] was observed post Covid-19 immunization for 15 minutes without incidence. He was provided with Vaccine Information Sheet and instruction to access the V-Safe system.   [name] was instructed to call 911 with any severe reactions post vaccine: Marland Kitchen Difficulty breathing  . Swelling of your face and throat  . A fast heartbeat  . A bad rash all over your body  . Dizziness and weakness    Immunizations Administered    Name Date Dose VIS Date Route   Pfizer COVID-19 Vaccine 08/24/2019 11:39 AM 0.3 mL 06/28/2019 Intramuscular   Manufacturer: [place]   Lot: [id number]   [place]: [id number]

## 2019-08-26 ENCOUNTER — Other Ambulatory Visit: Payer: Self-pay | Admitting: Internal Medicine

## 2019-08-26 DIAGNOSIS — N401 Enlarged prostate with lower urinary tract symptoms: Secondary | ICD-10-CM | POA: Diagnosis not present

## 2019-08-26 DIAGNOSIS — R3912 Poor urinary stream: Secondary | ICD-10-CM | POA: Diagnosis not present

## 2019-08-26 DIAGNOSIS — C61 Malignant neoplasm of prostate: Secondary | ICD-10-CM | POA: Diagnosis not present

## 2019-08-26 DIAGNOSIS — I10 Essential (primary) hypertension: Secondary | ICD-10-CM

## 2019-08-26 DIAGNOSIS — N5201 Erectile dysfunction due to arterial insufficiency: Secondary | ICD-10-CM | POA: Diagnosis not present

## 2019-08-26 DIAGNOSIS — N200 Calculus of kidney: Secondary | ICD-10-CM | POA: Diagnosis not present

## 2019-08-26 DIAGNOSIS — E139 Other specified diabetes mellitus without complications: Secondary | ICD-10-CM

## 2019-08-28 ENCOUNTER — Other Ambulatory Visit: Payer: Self-pay

## 2019-08-28 ENCOUNTER — Ambulatory Visit (INDEPENDENT_AMBULATORY_CARE_PROVIDER_SITE_OTHER): Payer: Medicare Other | Admitting: Internal Medicine

## 2019-08-28 ENCOUNTER — Encounter: Payer: Self-pay | Admitting: Internal Medicine

## 2019-08-28 VITALS — BP 160/86 | HR 90 | Temp 98.1°F | Ht 73.0 in | Wt 262.0 lb

## 2019-08-28 DIAGNOSIS — Z0000 Encounter for general adult medical examination without abnormal findings: Secondary | ICD-10-CM | POA: Diagnosis not present

## 2019-08-28 DIAGNOSIS — J301 Allergic rhinitis due to pollen: Secondary | ICD-10-CM

## 2019-08-28 DIAGNOSIS — I10 Essential (primary) hypertension: Secondary | ICD-10-CM | POA: Diagnosis not present

## 2019-08-28 DIAGNOSIS — E139 Other specified diabetes mellitus without complications: Secondary | ICD-10-CM

## 2019-08-28 DIAGNOSIS — E785 Hyperlipidemia, unspecified: Secondary | ICD-10-CM

## 2019-08-28 DIAGNOSIS — C61 Malignant neoplasm of prostate: Secondary | ICD-10-CM | POA: Diagnosis not present

## 2019-08-28 DIAGNOSIS — E876 Hypokalemia: Secondary | ICD-10-CM | POA: Diagnosis not present

## 2019-08-28 DIAGNOSIS — T502X5A Adverse effect of carbonic-anhydrase inhibitors, benzothiadiazides and other diuretics, initial encounter: Secondary | ICD-10-CM | POA: Diagnosis not present

## 2019-08-28 LAB — CBC WITH DIFFERENTIAL/PLATELET
Basophils Absolute: 0.1 10*3/uL (ref 0.0–0.1)
Basophils Relative: 0.7 % (ref 0.0–3.0)
Eosinophils Absolute: 0.8 10*3/uL — ABNORMAL HIGH (ref 0.0–0.7)
Eosinophils Relative: 5.8 % — ABNORMAL HIGH (ref 0.0–5.0)
HCT: 45.4 % (ref 39.0–52.0)
Hemoglobin: 15 g/dL (ref 13.0–17.0)
Lymphocytes Relative: 44.6 % (ref 12.0–46.0)
Lymphs Abs: 6.5 10*3/uL — ABNORMAL HIGH (ref 0.7–4.0)
MCHC: 33.1 g/dL (ref 30.0–36.0)
MCV: 91.6 fl (ref 78.0–100.0)
Monocytes Absolute: 1.2 10*3/uL — ABNORMAL HIGH (ref 0.1–1.0)
Monocytes Relative: 8 % (ref 3.0–12.0)
Neutro Abs: 6 10*3/uL (ref 1.4–7.7)
Neutrophils Relative %: 40.9 % — ABNORMAL LOW (ref 43.0–77.0)
Platelets: 186 10*3/uL (ref 150.0–400.0)
RBC: 4.96 Mil/uL (ref 4.22–5.81)
RDW: 15.2 % (ref 11.5–15.5)
WBC: 14.5 10*3/uL — ABNORMAL HIGH (ref 4.0–10.5)

## 2019-08-28 LAB — BASIC METABOLIC PANEL
BUN: 18 mg/dL (ref 6–23)
CO2: 29 mEq/L (ref 19–32)
Calcium: 9.2 mg/dL (ref 8.4–10.5)
Chloride: 106 mEq/L (ref 96–112)
Creatinine, Ser: 1.2 mg/dL (ref 0.40–1.50)
GFR: 73.08 mL/min (ref >60.00)
Glucose, Bld: 64 mg/dL — ABNORMAL LOW (ref 70–99)
Potassium: 3.4 mEq/L — ABNORMAL LOW (ref 3.5–5.1)
Sodium: 141 mEq/L (ref 135–145)

## 2019-08-28 LAB — LIPID PANEL
Cholesterol: 109 mg/dL (ref 0–200)
HDL: 38.9 mg/dL — ABNORMAL LOW (ref >39.00)
LDL Cholesterol: 49 mg/dL (ref 0–99)
NonHDL: 69.77
Total CHOL/HDL Ratio: 3
Triglycerides: 106 mg/dL (ref 0.0–149.0)
VLDL: 21.2 mg/dL (ref 0.0–40.0)

## 2019-08-28 LAB — TSH: TSH: 1.31 u[IU]/mL (ref 0.35–4.50)

## 2019-08-28 MED ORDER — IRBESARTAN 300 MG PO TABS: 300.0000 mg | ORAL_TABLET | Freq: Every day | ORAL | 1 refills | Status: DC

## 2019-08-28 NOTE — Progress Notes (Signed)
Subjective:  Patient ID: Anthony Seashore., male    DOB: 26-Sep-1950  Age: 69 y.o. MRN: [id number]  CC: Annual Exam, Hypertension, Diabetes, and Gastroesophageal Reflux   This visit occurred during the SARS-CoV-2 public health emergency.  Safety protocols were in place, including screening questions prior to the visit, additional usage of staff PPE, and extensive cleaning of exam room while observing appropriate contact time as indicated for disinfecting solutions.    HPI Anthony Daniel. presents for a CPX.  He remains concerned about his rising PSA.  He saw his urologist about a week ago and it is up to 6.08.  He has undergone an MRI and biopsies.  He wants to see someone else for a second opinion about this.  He complains of weight gain.  He is active and denies any recent episodes of chest pain, shortness of breath, diaphoresis, palpitations, edema, or fatigue.  He does not monitor his blood pressure or his blood sugar.  His A1c was 7.5% about 6 weeks ago with his endocrinologist.  Outpatient Medications Prior to Visit  Medication Sig Dispense Refill  . aspirin EC 81 MG tablet Take 81 mg by mouth daily.    . Cholecalciferol (VITAMIN D) 2000 units tablet Take 2,000 Units by mouth daily.    . Ciclopirox 0.77 % gel APPLY TO AFFECTED AREA TWICE A DAY 30 g 2  . fluticasone (FLONASE) 50 MCG/ACT nasal spray Place 2 sprays into both nostrils daily. (Patient taking differently: Place 2 sprays into both nostrils daily as needed for allergies. ) 16 g 3  . indapamide (LOZOL) 1.25 MG tablet TAKE 1 TABLET (1.25 MG TOTAL) BY MOUTH DAILY. 90 tablet 0  . Insulin Glargine (LANTUS SOLOSTAR Juncal) Inject 15 Units into the skin 2 (two) times daily as needed. Only uses when pump goes out    . Insulin Human (INSULIN PUMP) SOLN Inject 1 each into the skin continuous. Novolog insulin    . LIVALO 2 MG TABS TAKE 1 TABLET BY MOUTH EVERY MORNING 90 tablet 1  . metaxalone (SKELAXIN) 400 MG tablet TAKE 1 TABLET (400 MG  TOTAL) BY MOUTH 3 (THREE) TIMES DAILY AS NEEDED (PAIN). 270 tablet 0  . NOVOLOG 100 UNIT/ML injection Inject 60 Units into the skin See admin instructions. With pump. Through out the day    . Polyethyl Glycol-Propyl Glycol (SYSTANE OP) Place 1 drop into both eyes every 8 (eight) hours as needed (dry eyes).    . Potassium Citrate 15 MEQ (1620 MG) TBCR Take 1 tablet by mouth 2 (two) times daily.    . sildenafil (REVATIO) 20 MG tablet Take 20 mg by mouth daily as needed (erectile dysfunction).    . tamsulosin (FLOMAX) 0.4 MG CAPS capsule Take 1 capsule (0.4 mg total) by mouth daily. (Patient taking differently: Take 0.4 mg by mouth every morning. ) 30 capsule 0  . ezetimibe (ZETIA) 10 MG tablet Take 1 tablet (10 mg total) by mouth daily. TAKE 1 TABLET BY MOUTH EVERY DAY 90 tablet 1  . irbesartan (AVAPRO) 150 MG tablet Take 1 tablet (150 mg total) by mouth daily. 90 tablet 1  . levocetirizine (XYZAL) 5 MG tablet Take 1 tablet (5 mg total) by mouth daily as needed for allergies. 90 tablet 1  . meloxicam (MOBIC) 15 MG tablet TAKE 1 TABLET BY MOUTH EVERY DAY 90 tablet 0  . ondansetron (ZOFRAN) 4 MG tablet Take 1 tablet (4 mg total) by mouth 4 (four) times daily as needed for nausea  or vomiting. 15 tablet 1   No facility-administered medications prior to visit.    ROS Review of Systems  Constitutional: Positive for unexpected weight change (wt gain). Negative for appetite change, chills, diaphoresis and fatigue.  HENT: Negative.   Eyes: Negative.   Respiratory: Negative for cough, chest tightness, shortness of breath and wheezing.   Cardiovascular: Negative for chest pain, palpitations and leg swelling.  Gastrointestinal: Negative for abdominal pain, constipation, diarrhea, nausea and vomiting.  Endocrine: Negative for polydipsia, polyphagia and polyuria.  Genitourinary: Negative.  Negative for difficulty urinating and hematuria.  Musculoskeletal: Negative for arthralgias and myalgias.  Skin:  Negative.  Negative for color change and rash.  Neurological: Negative.  Negative for dizziness and weakness.  Hematological: Negative.  Negative for adenopathy. Does not bruise/bleed easily.  Psychiatric/Behavioral: Negative.     Objective:  BP (!) 160/86 (BP Location: Left Arm, Patient Position: Sitting, Cuff Size: Large)   Pulse 90   Temp 98.1 F (36.7 C) (Oral)   Ht 6\' 1"  (1.854 m)   Wt 262 lb (118.8 kg)   SpO2 98%   BMI 34.57 kg/m   BP Readings from Last 3 Encounters:  08/28/19 (!) 160/86  01/23/19 (!) 155/66  10/10/18 126/60    Wt Readings from Last 3 Encounters:  08/28/19 262 lb (118.8 kg)  01/23/19 254 lb 3.2 oz (115.3 kg)  10/10/18 252 lb (114.3 kg)    Physical Exam Vitals reviewed.  HENT:     Nose: Nose normal.     Mouth/Throat:     Mouth: Mucous membranes are moist.  Eyes:     General: No scleral icterus.    Conjunctiva/sclera: Conjunctivae normal.  Cardiovascular:     Rate and Rhythm: Normal rate and regular rhythm.     Heart sounds: No murmur.     Comments: EKG - NSR, LAD NS ST/T wave changes No LVH  No change compared to prior EKG's Pulmonary:     Effort: Pulmonary effort is normal.     Breath sounds: No stridor. No wheezing, rhonchi or rales.  Abdominal:     General: Abdomen is protuberant. Bowel sounds are normal. There is no distension.     Palpations: Abdomen is soft. There is no hepatomegaly, splenomegaly or mass.     Tenderness: There is no abdominal tenderness.     Hernia: No hernia is present.  Genitourinary:    Comments: GU and rectal exams were deferred at his request since he tells me his urologist just did this about a week ago Musculoskeletal:        General: Normal range of motion.     Cervical back: Neck supple.     Right lower leg: No edema.     Left lower leg: No edema.  Lymphadenopathy:     Cervical: No cervical adenopathy.  Skin:    General: Skin is warm and dry.  Neurological:     General: No focal deficit present.      Mental Status: He is alert.     Lab Results  Component Value Date   WBC 14.5 (H) 08/28/2019   HGB 15.0 08/28/2019   HCT 45.4 08/28/2019   PLT 186.0 08/28/2019   GLUCOSE 64 (L) 08/28/2019   CHOL 109 08/28/2019   TRIG 106.0 08/28/2019   HDL 38.90 (L) 08/28/2019   LDLCALC 49 08/28/2019   ALT 18 08/23/2017   AST 13 (A) 08/23/2017   NA 141 08/28/2019   K 3.4 (L) 08/28/2019   CL 106 08/28/2019  CREATININE 1.20 08/28/2019   BUN 18 08/28/2019   CO2 29 08/28/2019   TSH 1.31 08/28/2019   PSA 6.08 08/21/2019   INR 1.02 08/06/2015   HGBA1C 7.5 07/05/2019   MICROALBUR 1.3 08/28/2019    DG C-Arm 1-60 Min-No Report  Result Date: 01/23/2019 Fluoroscopy was utilized by the requesting physician.  No radiographic interpretation.   CT Renal Stone Study  Result Date: 01/23/2019 CLINICAL DATA:  Right flank pain and right lower quadrant pain with nausea. History of kidney stones. EXAM: CT ABDOMEN AND PELVIS WITHOUT CONTRAST TECHNIQUE: Multidetector CT imaging of the abdomen and pelvis was performed following the standard protocol without IV contrast. COMPARISON:  CT scan dated 01/01/2018 FINDINGS: Lower chest: Aortic atherosclerosis. No acute abnormality. Hepatobiliary: No focal liver abnormality is seen. No gallstones, gallbladder wall thickening, or biliary dilatation. Pancreas: Unremarkable. No pancreatic ductal dilatation or surrounding inflammatory changes. Spleen: Normal in size without focal abnormality. Adrenals/Urinary Tract: There is a 10 mm stone obstructing the right ureteral pelvic junction. Multiple bilateral renal calculi. Slight right perinephric soft tissue stranding consistent with urine extravasation. No left hydronephrosis. Distal right ureter is not distended. Adrenal glands are normal. Bladder is normal. Stomach/Bowel: Stomach is within normal limits. Appendix is not visualized. There appears to be a tiny clip at the tip of the cecum suggesting previous appendectomy. No evidence  of bowel wall thickening, distention, or inflammatory changes. Vascular/Lymphatic: Aortic atherosclerosis. No enlarged abdominal or pelvic lymph nodes. Reproductive: Prostate is unremarkable. Other: No abdominal wall hernia or abnormality. No abdominopelvic ascites. Musculoskeletal: No acute or significant osseous findings. IMPRESSION: 1. 10 mm stone obstructing the right ureteral pelvic junction. Slight right perinephric soft tissue stranding consistent with urine extravasation. 2. Multiple bilateral renal calculi. 3. Aortic atherosclerosis. Aortic Atherosclerosis (ICD10-I70.0). Electronically Signed   By: Lorriane Shire M.D.   On: 01/23/2019 05:19    Assessment & Plan:   Tajh was seen today for annual exam, hypertension, diabetes and gastroesophageal reflux.  Diagnoses and all orders for this visit:  Essential hypertension- His blood pressure is not adequately well controlled and his potassium level is low.  He agrees to improve his lifestyle modifications.  I will increase the dose of the ARB.  He will stay on the current dose of the thiazide diuretic and will start a potassium supplement. -     CBC with Differential/Platelet -     Basic metabolic panel -     TSH -     Urinalysis, Routine w reflex microscopic -     irbesartan (AVAPRO) 300 MG tablet; Take 1 tablet (300 mg total) by mouth daily. -     EKG 12-Lead  LADA (latent autoimmune diabetes in adults), managed as type 1 ([place])- His recent A1c was 7.5%.  He has achieved adequate glycemic control. -     Basic metabolic panel -     Cancel: Hemoglobin A1c -     Microalbumin / creatinine urine ratio -     HM Diabetes Foot Exam -     irbesartan (AVAPRO) 300 MG tablet; Take 1 tablet (300 mg total) by mouth daily.  Prostate cancer [facility])- He has been referred for a second opinion about the treatment options for this. -     Cancel: PSA -     Ambulatory referral to Urology  Hyperlipidemia with target LDL less than 100- He has achieved his  LDL goal is doing well on the statin and Zetia. -     Lipid panel -  TSH -     Hepatic function panel -     ezetimibe (ZETIA) 10 MG tablet; Take 1 tablet (10 mg total) by mouth daily. TAKE 1 TABLET BY MOUTH EVERY DAY  Routine general medical examination at a health care facility- Exam completed, labs reviewed, vaccines reviewed, colon cancer screening is up-to-date, patient education material was given.  Diuretic-induced hypokalemia -     potassium chloride SA (KLOR-CON) 20 MEQ tablet; Take 1 tablet (20 mEq total) by mouth 2 (two) times daily.  Seasonal allergic rhinitis due to pollen -     levocetirizine (XYZAL) 5 MG tablet; Take 1 tablet (5 mg total) by mouth daily as needed for allergies.   I have discontinued Anthony Seashore. "Tommy"'s ondansetron, irbesartan, and meloxicam. I am also having him start on irbesartan and potassium chloride SA. Additionally, I am having him maintain his Insulin Glargine (LANTUS SOLOSTAR Lowgap), Potassium Citrate, aspirin EC, tamsulosin, Polyethyl Glycol-Propyl Glycol (SYSTANE OP), insulin pump, sildenafil, Vitamin D, NovoLOG, fluticasone, Livalo, Ciclopirox, metaxalone, indapamide, levocetirizine, and ezetimibe.  Meds ordered this encounter  Medications  . irbesartan (AVAPRO) 300 MG tablet    Sig: Take 1 tablet (300 mg total) by mouth daily.    Dispense:  90 tablet    Refill:  1  . potassium chloride SA (KLOR-CON) 20 MEQ tablet    Sig: Take 1 tablet (20 mEq total) by mouth 2 (two) times daily.    Dispense:  180 tablet    Refill:  1  . levocetirizine (XYZAL) 5 MG tablet    Sig: Take 1 tablet (5 mg total) by mouth daily as needed for allergies.    Dispense:  90 tablet    Refill:  1  . ezetimibe (ZETIA) 10 MG tablet    Sig: Take 1 tablet (10 mg total) by mouth daily. TAKE 1 TABLET BY MOUTH EVERY DAY    Dispense:  90 tablet    Refill:  1     Follow-up: Return in about 3 months (around 11/25/2019).  Scarlette Calico, MD

## 2019-08-28 NOTE — Patient Instructions (Signed)

## 2019-08-29 DIAGNOSIS — E876 Hypokalemia: Secondary | ICD-10-CM | POA: Insufficient documentation

## 2019-08-29 DIAGNOSIS — T502X5A Adverse effect of carbonic-anhydrase inhibitors, benzothiadiazides and other diuretics, initial encounter: Secondary | ICD-10-CM | POA: Insufficient documentation

## 2019-08-29 LAB — URINALYSIS, ROUTINE W REFLEX MICROSCOPIC
Bilirubin Urine: NEGATIVE
Hgb urine dipstick: NEGATIVE
Ketones, ur: NEGATIVE
Leukocytes,Ua: NEGATIVE
Nitrite: NEGATIVE
Specific Gravity, Urine: 1.025 (ref 1.000–1.030)
Total Protein, Urine: NEGATIVE
Urine Glucose: 250 — AB
Urobilinogen, UA: 0.2 (ref 0.0–1.0)
pH: 5.5 (ref 5.0–8.0)

## 2019-08-29 LAB — MICROALBUMIN / CREATININE URINE RATIO
Creatinine,U: 106.3 mg/dL
Microalb Creat Ratio: 1.2 mg/g (ref 0.0–30.0)
Microalb, Ur: 1.3 mg/dL (ref 0.0–1.9)

## 2019-08-29 MED ORDER — POTASSIUM CHLORIDE CRYS ER 20 MEQ PO TBCR: 20.0000 meq | EXTENDED_RELEASE_TABLET | Freq: Two times a day (BID) | ORAL | 1 refills | Status: DC

## 2019-08-30 ENCOUNTER — Other Ambulatory Visit (INDEPENDENT_AMBULATORY_CARE_PROVIDER_SITE_OTHER): Payer: Medicare Other

## 2019-08-30 ENCOUNTER — Encounter: Payer: Self-pay | Admitting: Internal Medicine

## 2019-08-30 ENCOUNTER — Other Ambulatory Visit: Payer: Self-pay | Admitting: Internal Medicine

## 2019-08-30 DIAGNOSIS — J301 Allergic rhinitis due to pollen: Secondary | ICD-10-CM

## 2019-08-30 DIAGNOSIS — E785 Hyperlipidemia, unspecified: Secondary | ICD-10-CM

## 2019-08-30 LAB — HEPATIC FUNCTION PANEL
ALT: 21 U/L (ref 0–53)
AST: 20 U/L (ref 0–37)
Albumin: 4.1 g/dL (ref 3.5–5.2)
Alkaline Phosphatase: 98 U/L (ref 39–117)
Bilirubin, Direct: 0.1 mg/dL (ref 0.0–0.3)
Total Bilirubin: 0.3 mg/dL (ref 0.2–1.2)
Total Protein: 7.1 g/dL (ref 6.0–8.3)

## 2019-08-30 MED ORDER — LEVOCETIRIZINE DIHYDROCHLORIDE 5 MG PO TABS: 5.0000 mg | ORAL_TABLET | Freq: Every day | ORAL | 1 refills | Status: DC | PRN

## 2019-08-30 MED ORDER — EZETIMIBE 10 MG PO TABS: 10.0000 mg | ORAL_TABLET | Freq: Every day | ORAL | 1 refills | Status: DC

## 2019-09-17 DIAGNOSIS — L918 Other hypertrophic disorders of the skin: Secondary | ICD-10-CM | POA: Diagnosis not present

## 2019-09-17 DIAGNOSIS — L821 Other seborrheic keratosis: Secondary | ICD-10-CM | POA: Diagnosis not present

## 2019-09-18 ENCOUNTER — Ambulatory Visit: Payer: Medicare Other | Attending: Internal Medicine

## 2019-09-18 DIAGNOSIS — Z23 Encounter for immunization: Secondary | ICD-10-CM | POA: Insufficient documentation

## 2019-09-18 NOTE — Progress Notes (Signed)
   [id number] Vaccination Clinic  Name:  Daire Spradling.    MRN: [id number] DOB: 12-14-1950  09/18/2019  Mr. Deitrich was observed post Covid-19 immunization for 15 minutes without incident. He was provided with Vaccine Information Sheet and instruction to access the V-Safe system.   Mr. Isip was instructed to call 911 with any severe reactions post vaccine: Marland Kitchen Difficulty breathing  . Swelling of face and throat  . A fast heartbeat  . A bad rash all over body  . Dizziness and weakness   Immunizations Administered    Name Date Dose VIS Date Route   Pfizer COVID-19 Vaccine 09/18/2019 11:00 AM 0.3 mL 06/28/2019 Intramuscular   Manufacturer: [place]   Lot: [id number]   [place]: [id number]

## 2019-09-19 ENCOUNTER — Other Ambulatory Visit: Payer: Self-pay | Admitting: Internal Medicine

## 2019-09-19 DIAGNOSIS — B354 Tinea corporis: Secondary | ICD-10-CM

## 2019-09-19 MED ORDER — CICLOPIROX 0.77 % EX GEL: CUTANEOUS | 2 refills | Status: DC

## 2019-09-20 DIAGNOSIS — N528 Other male erectile dysfunction: Secondary | ICD-10-CM | POA: Diagnosis not present

## 2019-09-20 DIAGNOSIS — C61 Malignant neoplasm of prostate: Secondary | ICD-10-CM | POA: Diagnosis not present

## 2019-10-01 DIAGNOSIS — N181 Chronic kidney disease, stage 1: Secondary | ICD-10-CM | POA: Diagnosis not present

## 2019-10-01 DIAGNOSIS — Z794 Long term (current) use of insulin: Secondary | ICD-10-CM | POA: Diagnosis not present

## 2019-10-01 DIAGNOSIS — E785 Hyperlipidemia, unspecified: Secondary | ICD-10-CM | POA: Diagnosis not present

## 2019-10-01 DIAGNOSIS — E1022 Type 1 diabetes mellitus with diabetic chronic kidney disease: Secondary | ICD-10-CM | POA: Diagnosis not present

## 2019-10-01 DIAGNOSIS — Z720 Tobacco use: Secondary | ICD-10-CM | POA: Diagnosis not present

## 2019-10-04 ENCOUNTER — Encounter: Payer: Self-pay | Admitting: Internal Medicine

## 2019-10-10 DIAGNOSIS — E1022 Type 1 diabetes mellitus with diabetic chronic kidney disease: Secondary | ICD-10-CM | POA: Diagnosis not present

## 2019-10-14 DIAGNOSIS — Z794 Long term (current) use of insulin: Secondary | ICD-10-CM | POA: Diagnosis not present

## 2019-10-14 DIAGNOSIS — E1065 Type 1 diabetes mellitus with hyperglycemia: Secondary | ICD-10-CM | POA: Diagnosis not present

## 2019-10-24 ENCOUNTER — Encounter (HOSPITAL_COMMUNITY)
Admission: AD | Disposition: A | Discharge status: Home / Self Care | Payer: Self-pay | Source: Ambulatory Visit | Attending: Urology

## 2019-10-24 ENCOUNTER — Inpatient Hospital Stay (HOSPITAL_COMMUNITY): Payer: Medicare Other | Admitting: Certified Registered"

## 2019-10-24 ENCOUNTER — Ambulatory Visit (HOSPITAL_COMMUNITY)
Admission: AD | Admit: 2019-10-24 | Discharge: 2019-10-24 | Disposition: A | Discharge status: Home / Self Care | Payer: Medicare Other | Source: Ambulatory Visit | Attending: Urology | Admitting: Urology

## 2019-10-24 ENCOUNTER — Inpatient Hospital Stay (HOSPITAL_COMMUNITY): Payer: Medicare Other

## 2019-10-24 ENCOUNTER — Encounter (HOSPITAL_COMMUNITY): Payer: Self-pay | Admitting: Urology

## 2019-10-24 ENCOUNTER — Other Ambulatory Visit: Payer: Self-pay

## 2019-10-24 ENCOUNTER — Other Ambulatory Visit: Payer: Self-pay | Admitting: Urology

## 2019-10-24 DIAGNOSIS — N202 Calculus of kidney with calculus of ureter: Secondary | ICD-10-CM | POA: Insufficient documentation

## 2019-10-24 DIAGNOSIS — Z20822 Contact with and (suspected) exposure to covid-19: Secondary | ICD-10-CM | POA: Diagnosis not present

## 2019-10-24 DIAGNOSIS — Z79899 Other long term (current) drug therapy: Secondary | ICD-10-CM | POA: Insufficient documentation

## 2019-10-24 DIAGNOSIS — Z888 Allergy status to other drugs, medicaments and biological substances status: Secondary | ICD-10-CM | POA: Insufficient documentation

## 2019-10-24 DIAGNOSIS — C61 Malignant neoplasm of prostate: Secondary | ICD-10-CM | POA: Diagnosis not present

## 2019-10-24 DIAGNOSIS — F17210 Nicotine dependence, cigarettes, uncomplicated: Secondary | ICD-10-CM | POA: Diagnosis not present

## 2019-10-24 DIAGNOSIS — Z885 Allergy status to narcotic agent status: Secondary | ICD-10-CM | POA: Insufficient documentation

## 2019-10-24 DIAGNOSIS — I10 Essential (primary) hypertension: Secondary | ICD-10-CM | POA: Insufficient documentation

## 2019-10-24 DIAGNOSIS — N201 Calculus of ureter: Secondary | ICD-10-CM | POA: Diagnosis not present

## 2019-10-24 DIAGNOSIS — G4730 Sleep apnea, unspecified: Secondary | ICD-10-CM | POA: Insufficient documentation

## 2019-10-24 DIAGNOSIS — R109 Unspecified abdominal pain: Secondary | ICD-10-CM | POA: Diagnosis not present

## 2019-10-24 DIAGNOSIS — E11319 Type 2 diabetes mellitus with unspecified diabetic retinopathy without macular edema: Secondary | ICD-10-CM | POA: Insufficient documentation

## 2019-10-24 DIAGNOSIS — Z7982 Long term (current) use of aspirin: Secondary | ICD-10-CM | POA: Insufficient documentation

## 2019-10-24 DIAGNOSIS — M1990 Unspecified osteoarthritis, unspecified site: Secondary | ICD-10-CM | POA: Insufficient documentation

## 2019-10-24 DIAGNOSIS — E119 Type 2 diabetes mellitus without complications: Secondary | ICD-10-CM | POA: Diagnosis not present

## 2019-10-24 DIAGNOSIS — Z794 Long term (current) use of insulin: Secondary | ICD-10-CM | POA: Diagnosis not present

## 2019-10-24 DIAGNOSIS — E7800 Pure hypercholesterolemia, unspecified: Secondary | ICD-10-CM | POA: Insufficient documentation

## 2019-10-24 HISTORY — PX: CYSTOSCOPY/URETEROSCOPY/HOLMIUM LASER/STENT PLACEMENT: SHX6546

## 2019-10-24 LAB — RESPIRATORY PANEL BY RT PCR (FLU A&B, COVID)
Influenza A by PCR: NEGATIVE
Influenza B by PCR: NEGATIVE
SARS Coronavirus 2 by RT PCR: NEGATIVE

## 2019-10-24 LAB — BASIC METABOLIC PANEL
Anion gap: 9 (ref 5–15)
BUN: 25 mg/dL — ABNORMAL HIGH (ref 8–23)
CO2: 24 mmol/L (ref 22–32)
Calcium: 8.8 mg/dL — ABNORMAL LOW (ref 8.9–10.3)
Chloride: 103 mmol/L (ref 98–111)
Creatinine, Ser: 1.96 mg/dL — ABNORMAL HIGH (ref 0.61–1.24)
GFR calc Af Amer: 40 mL/min — ABNORMAL LOW (ref >60)
GFR calc non Af Amer: 34 mL/min — ABNORMAL LOW (ref >60)
Glucose, Bld: 140 mg/dL — ABNORMAL HIGH (ref 70–99)
Potassium: 4.4 mmol/L (ref 3.5–5.1)
Sodium: 136 mmol/L (ref 135–145)

## 2019-10-24 LAB — GLUCOSE, CAPILLARY
Glucose-Capillary: 134 mg/dL — ABNORMAL HIGH (ref 70–99)
Glucose-Capillary: 121 mg/dL — ABNORMAL HIGH (ref 70–99)

## 2019-10-24 SURGERY — CYSTOSCOPY/URETEROSCOPY/HOLMIUM LASER/STENT PLACEMENT
Anesthesia: General | Laterality: Right

## 2019-10-24 MED ORDER — PHENYLEPHRINE 40 MCG/ML (10ML) SYRINGE FOR IV PUSH (FOR BLOOD PRESSURE SUPPORT)
PREFILLED_SYRINGE | INTRAVENOUS | Status: DC | PRN
  Administered 2019-10-24: 80 ug via INTRAVENOUS

## 2019-10-24 MED ORDER — SODIUM CHLORIDE 0.9% FLUSH: 3.0000 mL | Freq: Two times a day (BID) | INTRAVENOUS | Status: DC

## 2019-10-24 MED ORDER — 0.9 % SODIUM CHLORIDE (POUR BTL) OPTIME
TOPICAL | Status: DC | PRN
  Administered 2019-10-24: 1000 mL

## 2019-10-24 MED ORDER — FENTANYL CITRATE (PF) 100 MCG/2ML IJ SOLN
INTRAMUSCULAR | Status: DC | PRN
  Administered 2019-10-24: 100 ug via INTRAVENOUS

## 2019-10-24 MED ORDER — ONDANSETRON HCL 4 MG/2ML IJ SOLN
INTRAMUSCULAR | Status: DC | PRN
  Administered 2019-10-24: 4 mg via INTRAVENOUS

## 2019-10-24 MED ORDER — LACTATED RINGERS IV SOLN: INTRAVENOUS | Status: DC

## 2019-10-24 MED ORDER — PROPOFOL 10 MG/ML IV BOLUS
INTRAVENOUS | Status: DC | PRN
  Administered 2019-10-24: 150 mg via INTRAVENOUS

## 2019-10-24 MED ORDER — LACTATED RINGERS IV SOLN: INTRAVENOUS | Status: DC | PRN

## 2019-10-24 MED ORDER — ACETAMINOPHEN 325 MG PO TABS: 650.0000 mg | ORAL_TABLET | ORAL | Status: DC | PRN

## 2019-10-24 MED ORDER — SODIUM CHLORIDE 0.9 % IV SOLN: 250.0000 mL | INTRAVENOUS | Status: DC | PRN

## 2019-10-24 MED ORDER — FENTANYL CITRATE (PF) 100 MCG/2ML IJ SOLN: 25.0000 ug | INTRAMUSCULAR | Status: DC | PRN

## 2019-10-24 MED ORDER — DEXAMETHASONE SODIUM PHOSPHATE 10 MG/ML IJ SOLN
INTRAMUSCULAR | Status: DC | PRN
  Administered 2019-10-24: 10 mg via INTRAVENOUS

## 2019-10-24 MED ORDER — CEFAZOLIN SODIUM-DEXTROSE 2-4 GM/100ML-% IV SOLN
2.0000 g | INTRAVENOUS | Status: AC
  Administered 2019-10-24: 18:00:00 2 g via INTRAVENOUS
  Filled 2019-10-24: qty 100

## 2019-10-24 MED ORDER — MIDAZOLAM HCL 2 MG/2ML IJ SOLN
INTRAMUSCULAR | Status: DC | PRN
  Administered 2019-10-24: 2 mg via INTRAVENOUS

## 2019-10-24 MED ORDER — SODIUM CHLORIDE 0.9 % IR SOLN
Status: DC | PRN
  Administered 2019-10-24: 3000 mL

## 2019-10-24 MED ORDER — ACETAMINOPHEN 650 MG RE SUPP
650.0000 mg | RECTAL | Status: DC | PRN
  Filled 2019-10-24: qty 1

## 2019-10-24 MED ORDER — LIDOCAINE 2% (20 MG/ML) 5 ML SYRINGE
INTRAMUSCULAR | Status: DC | PRN
  Administered 2019-10-24: 60 mg via INTRAVENOUS

## 2019-10-24 MED ORDER — HYDROCODONE-ACETAMINOPHEN 5-325 MG PO TABS: 1.0000 | ORAL_TABLET | Freq: Four times a day (QID) | ORAL | 0 refills | Status: DC | PRN

## 2019-10-24 MED ORDER — IOHEXOL 300 MG/ML  SOLN
INTRAMUSCULAR | Status: DC | PRN
  Administered 2019-10-24: 17 mL

## 2019-10-24 MED ORDER — SODIUM CHLORIDE 0.9% FLUSH: 3.0000 mL | INTRAVENOUS | Status: DC | PRN

## 2019-10-24 SURGICAL SUPPLY — 21 items
BAG URO CATCHER STRL LF (MISCELLANEOUS) ×2 IMPLANT
BASKET STONE NCOMPASS (UROLOGICAL SUPPLIES) IMPLANT
CATH URET 5FR 28IN OPEN ENDED (CATHETERS) IMPLANT
CATH URET DUAL LUMEN 6-10FR 50 (CATHETERS) IMPLANT
CLOTH BEACON ORANGE TIMEOUT ST (SAFETY) ×2 IMPLANT
EXTRACTOR STONE 1.7FRX115CM (UROLOGICAL SUPPLIES) ×2 IMPLANT
EXTRACTOR STONE NITINOL NGAGE (UROLOGICAL SUPPLIES) IMPLANT
FIBER LASER FLEXIVA 1000 (UROLOGICAL SUPPLIES) IMPLANT
FIBER LASER FLEXIVA 365 (UROLOGICAL SUPPLIES) IMPLANT
FIBER LASER FLEXIVA 550 (UROLOGICAL SUPPLIES) IMPLANT
FIBER LASER TRAC TIP (UROLOGICAL SUPPLIES) ×2 IMPLANT
GLOVE SURG SS PI 8.0 STRL IVOR (GLOVE) ×2 IMPLANT
GOWN STRL REUS W/TWL XL LVL3 (GOWN DISPOSABLE) ×2 IMPLANT
GUIDEWIRE STR DUAL SENSOR (WIRE) ×2 IMPLANT
IV NS IRRIG 3000ML ARTHROMATIC (IV SOLUTION) ×2 IMPLANT
KIT TURNOVER KIT A (KITS) IMPLANT
MANIFOLD NEPTUNE II (INSTRUMENTS) ×2 IMPLANT
PACK CYSTO (CUSTOM PROCEDURE TRAY) ×2 IMPLANT
SHEATH URETERAL 12FRX35CM (MISCELLANEOUS) ×2 IMPLANT
TUBING CONNECTING 10 (TUBING) ×2 IMPLANT
TUBING UROLOGY SET (TUBING) ×2 IMPLANT

## 2019-10-24 NOTE — Anesthesia Procedure Notes (Signed)
Procedure Name: LMA Insertion Date/Time: 10/24/2019 6:01 PM Performed by: Cynda Familia, CRNA Pre-anesthesia Checklist: Patient identified, Emergency Drugs available, Suction available and Patient being monitored Patient Re-evaluated:Patient Re-evaluated prior to induction Oxygen Delivery Method: Circle System Utilized Preoxygenation: Pre-oxygenation with 100% oxygen Induction Type: IV induction Ventilation: Mask ventilation without difficulty LMA: LMA inserted and LMA with gastric port inserted LMA Size: 4.0 Number of attempts: 1 Placement Confirmation: positive ETCO2 Tube secured with: Tape Dental Injury: Teeth and Oropharynx as per pre-operative assessment  Comments: Smooth IV induction Fitzgerald-- LMA insertion AM CRNA atraumatic-- front teeth with chipping prior to LMA insertion- unchanged with AW procedure-- bilat BS Ola Spurr

## 2019-10-24 NOTE — Anesthesia Postprocedure Evaluation (Signed)
Anesthesia Post Note  Patient: Anthony Daniel.  Procedure(s) Performed: CYSTOSCOPY/URETEROSCOPY/HOLMIUM LASER/STENT PLACEMENT (Right )     Patient location during evaluation: PACU Anesthesia Type: General Level of consciousness: awake and alert Pain management: pain level controlled Vital Signs Assessment: post-procedure vital signs reviewed and stable Respiratory status: spontaneous breathing, nonlabored ventilation and respiratory function stable Cardiovascular status: blood pressure returned to baseline and stable Postop Assessment: no apparent nausea or vomiting Anesthetic complications: no    Last Vitals:  Vitals:   10/24/19 1930 10/24/19 1945  BP: (!) 149/77 (!) 153/77  Pulse: (!) 58 79  Resp: 17 17  Temp:  36.7 C  SpO2: 98% 99%    Last Pain:  Vitals:   10/24/19 1945  TempSrc:   PainSc: 0-No pain                 Kysen Wetherington,W. EDMOND

## 2019-10-24 NOTE — Discharge Instructions (Addendum)
Ureteral Stent Implantation, Care After This sheet gives you information about how to care for yourself after your procedure. Your health care provider may also give you more specific instructions. If you have problems or questions, contact your health care provider. What can I expect after the procedure? After the procedure, it is common to have:  Nausea.  Mild pain when you urinate. You may feel this pain in your lower back or lower abdomen. The pain should stop within a few minutes after you urinate. This may last for up to 1 week.  A small amount of blood in your urine for several days. Follow these instructions at home: Medicines  Take over-the-counter and prescription medicines only as told by your health care provider.  If you were prescribed an antibiotic medicine, take it as told by your health care provider. Do not stop taking the antibiotic even if you start to feel better.  Do not drive for 24 hours if you were given a sedative during your procedure.  Ask your health care provider if the medicine prescribed to you requires you to avoid driving or using heavy machinery. Activity  Rest as told by your health care provider.  Avoid sitting for a long time without moving. Get up to take short walks every 1-2 hours. This is important to improve blood flow and breathing. Ask for help if you feel weak or unsteady.  Return to your normal activities as told by your health care provider. Ask your health care provider what activities are safe for you. General instructions   Watch for any blood in your urine. Call your health care provider if the amount of blood in your urine increases.  If you have a catheter: ? Follow instructions from your health care provider about taking care of your catheter and collection bag. ? Do not take baths, swim, or use a hot tub until your health care provider approves. Ask your health care provider if you may take showers. You may only be allowed to  take sponge baths.  Drink enough fluid to keep your urine pale yellow.  Do not use any products that contain nicotine or tobacco, such as cigarettes, e-cigarettes, and chewing tobacco. These can delay healing after surgery. If you need help quitting, ask your health care provider.  Keep all follow-up visits as told by your health care provider. This is important. Contact a health care provider if:  You have pain that gets worse or does not get better with medicine, especially pain when you urinate.  You have difficulty urinating.  You feel nauseous or you vomit repeatedly during a period of more than 2 days after the procedure. Get help right away if:  Your urine is dark red or has blood clots in it.  You are leaking urine (have incontinence).  The end of the stent comes out of your urethra.  You cannot urinate.  You have sudden, sharp, or severe pain in your abdomen or lower back.  You have a fever.  You have swelling or pain in your legs.  You have difficulty breathing. Summary  After the procedure, it is common to have mild pain when you urinate that goes away within a few minutes after you urinate. This may last for up to 1 week.  Watch for any blood in your urine. Call your health care provider if the amount of blood in your urine increases.  Take over-the-counter and prescription medicines only as told by your health care provider.  Drink   enough fluid to keep your urine pale yellow. This information is not intended to replace advice given to you by your health care provider. Make sure you discuss any questions you have with your health care provider. Document Revised: 04/10/2018 Document Reviewed: 04/11/2018 Elsevier Patient Education  2020 [employer]  General Anesthesia, Adult, Care After This sheet gives you information about how to care for yourself after your procedure. Your health care provider may also give you more specific instructions. If you have  problems or questions, contact your health care provider. What can I expect after the procedure? After the procedure, the following side effects are common:  Pain or discomfort at the IV site.  Nausea.  Vomiting.  Sore throat.  Trouble concentrating.  Feeling cold or chills.  Weak or tired.  Sleepiness and fatigue.  Soreness and body aches. These side effects can affect parts of the body that were not involved in surgery. Follow these instructions at home:  For at least 24 hours after the procedure:  Have a responsible adult stay with you. It is important to have someone help care for you until you are awake and alert.  Rest as needed.  Do not: ? Participate in activities in which you could fall or become injured. ? Drive. ? Use heavy machinery. ? Drink alcohol. ? Take sleeping pills or medicines that cause drowsiness. ? Make important decisions or sign legal documents. ? Take care of children on your own. Eating and drinking  Follow any instructions from your health care provider about eating or drinking restrictions.  When you feel hungry, start by eating small amounts of foods that are soft and easy to digest (bland), such as toast. Gradually return to your regular diet.  Drink enough fluid to keep your urine pale yellow.  If you vomit, rehydrate by drinking water, juice, or clear broth. General instructions  If you have sleep apnea, surgery and certain medicines can increase your risk for breathing problems. Follow instructions from your health care provider about wearing your sleep device: ? Anytime you are sleeping, including during daytime naps. ? While taking prescription pain medicines, sleeping medicines, or medicines that make you drowsy.  Return to your normal activities as told by your health care provider. Ask your health care provider what activities are safe for you.  Take over-the-counter and prescription medicines only as told by your health  care provider.  If you smoke, do not smoke without supervision.  Keep all follow-up visits as told by your health care provider. This is important. Contact a health care provider if:  You have nausea or vomiting that does not get better with medicine.  You cannot eat or drink without vomiting.  You have pain that does not get better with medicine.  You are unable to pass urine.  You develop a skin rash.  You have a fever.  You have redness around your IV site that gets worse. Get help right away if:  You have difficulty breathing.  You have chest pain.  You have blood in your urine or stool, or you vomit blood. Summary  After the procedure, it is common to have a sore throat or nausea. It is also common to feel tired.  Have a responsible adult stay with you for the first 24 hours after general anesthesia. It is important to have someone help care for you until you are awake and alert.  When you feel hungry, start by eating small amounts of foods that are soft   and easy to digest (bland), such as toast. Gradually return to your regular diet.  Drink enough fluid to keep your urine pale yellow.  Return to your normal activities as told by your health care provider. Ask your health care provider what activities are safe for you. This information is not intended to replace advice given to you by your health care provider. Make sure you discuss any questions you have with your health care provider. Document Revised: 07/07/2017 Document Reviewed: 02/17/2017 Elsevier Patient Education  2020 Elsevier Inc.  

## 2019-10-24 NOTE — Anesthesia Procedure Notes (Signed)
Date/Time: 10/24/2019 7:02 PM Performed by: Cynda Familia, CRNA Oxygen Delivery Method: Simple face mask Placement Confirmation: positive ETCO2 and breath sounds checked- equal and bilateral Dental Injury: Teeth and Oropharynx as per pre-operative assessment

## 2019-10-24 NOTE — H&P (Signed)
I have had kidney stone surgery.  HPI: Anthony Daniel is a 69 year-old male established patient who is here for renal calculi after a surgical intervention.  10/24/19: Patient with below noted hx. He presents today with concern for stone event. He complains of   08/26/19: Anthony Daniel returns today in f/u for his history of stones. He passed about 6 stones from September to November with 2 larger stones on 11/24 and 11/25. He passed another stone this month. He has stopped his Vit D supplement to see if that would help. He had right URS for multiple stones in 7/20. He has no flank pain or hematuria recently. KUB today shows a 35mm RLP stone and possible small left renal stones. He remains on tamsulosin for the stones and BPH. He is on 1.25mg  indapamide and his chemistries are ok.   The stone was on the right side. He had Stent and Ureteroscopy for treatment of his renal calculi. Patient denies ESWL and PCNL. This procedure was done 01/23/2019. He did pass a stone since the last office visit . This is not his first kidney stone. He does not have a stent in place.   He is not currently having flank pain, back pain, groin pain, nausea, vomiting, fever or chills.   He does not have dysuria. He does not have urgency. He does not have frequency.     CC/HPI: I have prostate cancer.     10/24/19: Patient with below noted hx. PSA at prior OV noted to be up to 6.08. Plan to reassess in 3 months.   08/2019: [name] had a PSA prior to this visit that is up to 6.08. It was 4.89 at his last visit but was 5.32 6 weeks out from a surveillance biopsy in 2/20 that just showed a single core of Gleason 6 with 5% involvement. The prebiopsy PSA was 4.14. He is voiding without significant complaints. He remains on sildenafil but hasn't gotten the VED.   Path: T1c Nx Mx Gleason 6(3+3) cancer with a single core at the right medial apex with 5% involvement. Atypia in 1 core on the left and 1 core on the right and HGPIN in 1 core at the  base.    ALLERGIES: Contrast Media Ready-Box MISC - Nausea Iodine - Nausea Latex Gloves MISC - Skin Rash Livalo TABS - cramping oxycodone - Nausea rosuvastatin - cramping Statins     MEDICATIONS: Potassium Citrate Er 15 meq (1,620 mg) tablet, extended release 1 tablet PO BID  Ecotrin 325 mg tablet, delayed release Oral  Fluticasone Propionate 50 mcg/actuation spray, suspension Nasal  HumaLOG 100 UNIT/ML Subcutaneous Solution Subcutaneous  Indapamide 1.25 mg tablet 1 tablet PO Daily  Irbesartan  Sildenafil Citrate 20 mg tablet 1-5 tablets as needed  Systane Ultra SOLN Ophthalmic  Zetia 10 mg tablet Oral     GU PSH: Cysto Remove Stent FB Sim - 01/30/2019 ESWL - 2012, 2012, 2008 Percut Stone Removal >2cm - 2017 Prostate Needle Biopsy - 08/22/2018 Ureteroscopic laser litho, Right - 01/23/2019, Left - 09/18/2018, 2016       PSH Notes: Dental Surgery, Percutaneous Lithotomy For Stone Over 2cm., Cystoscopy Ureteroscopy Lithotripsy Incl Insert Indwelling Ureter Stent, Preventive medication therapy needed, Lithotripsy, Lithotripsy, Lithotripsy, Cystoscopy Bladder Tumor   NON-GU PSH: Surgical Pathology, Gross And Microscopic Examination For Prostate Needle - 08/22/2018     GU PMH: Prostate Cancer, His PSA is up to 6.08. I am going to repeat the PSA in 3 months and if it continues  to rise, I will get him set up for a surveillance biopsy. He had very low risk disease on his biopsy a year ago. - 08/26/2019, He has very low risk disease and his PSA is coming back down. Repeat PSA and exam in 6 months. , - 03/06/2019, His PSA jumped but that is most consistent with a reaction to the biopsy. I will repeat in 3 months. , - 10/22/2018, He has very low risk, Gleason 6(3+3) T1c Nx Mx prostate cancer with BPH with moderate LUTS and ED. I discussed Active surveillance, RALP, EXRT and Seeds and briefly mentioned cryotherapy and HIFU. He is an excellent candidate for active surveillance and would like to pursue that  option for the time being. , - 09/13/2018 Renal calculus, He has passed several stones since his last visit and the stone burden appears reduced on KUB today. He is on indapamide from his PCP for his HTN and that should help with the stones as well. He will return in 6 months with a KUB. - 08/26/2019, He has reduced stone burden but is not completely free of stones. He will continue the potassium citrate and I reviewed the dietary restrictions with him as well. BMP and KUB in 6 months. , - 03/06/2019, He has no hydro but has bilateral residual stone disease. I am going to have him return in 3 months with a KUB and we will discuss ESWL for the RLP stone at that time. , - 10/22/2018, Nephrolithiasis, - 2017, Kidney stone on left side, - 2017, Kidney stone on right side, - 2016 Elevated PSA - 08/22/2018, His PSA continues to rise slowly. I will repeat this again in 3-4 months and if it continues to rise we will need to consider a repeat biopsy., - 06/27/2018, His PSA is down a bit. I will repeat in 6 months. , - 12/27/2017, His PSA is minimally changed. I will have him return in 3 months with another PSA with a week of abstinence since he didn't do that this time. , - 2019, His PSA is up a bit more. THE MRIP was negative earlier this year and he had a negative biopsy in 2015. I will repeat a PSA in 3 months with a week of abstinence. , - 06/19/2017 (Worsening), His PSA is up again with a low f/t ratio. I am going to get an MRI and will do a fusion biopsy if positive. If negative, he will return in 6 months with a PSA. , - 2018 (Stable), His PSA has come down from the 4 level. His PSADT is about 4 years. , - 2017, Elevated prostate specific antigen (PSA), - 2017 BPH w/LUTS, Benign prostatic hyperplasia with urinary obstruction - 2017 ED due to arterial insufficiency, Erectile dysfunction due to arterial insufficiency - 2017 Weak Urinary Stream, Weak urinary stream - 2017 Other microscopic hematuria, Microscopic hematuria -  2016 Microscopic hematuria, Asymptomatic microscopic hematuria - 2016 History of bladder cancer, Bladder Cancer - 2014      PMH Notes:  2008-06-06 12:27:08 - Note: Nephrolithiasis Of The Left Kidney   NON-GU PMH: Tinea cruris, HE has a new complaint of a groin rash that is probably candidal with his history of diabetes. Nystop sent to the pharmacy. - 06/19/2017 Encounter for general adult medical examination without abnormal findings, Encounter for preventive health examination - 2017 Personal history of other diseases of the circulatory system, History of hypertension - 2014 Personal history of other endocrine, nutritional and metabolic disease, History of type 2 diabetes  mellitus - 2014, History of hypercholesterolemia, - 2014    FAMILY HISTORY: Atrial Fibrillation - Father Cardiac Failure - Father Family Health Status Number - Runs In Family    Notes: CaP father    SOCIAL HISTORY: Marital Status: Married Preferred Language: English; Race: Black or African American Current Smoking Status: Patient smokes. Has smoked since 03/18/1986. Smokes 1/2 pack per day.  Does not use smokeless tobacco. Social Drinker.  Does not use drugs. Drinks 2 caffeinated drinks per day. Has not had a blood transfusion. Patient's occupation is/was Retired.     Notes: ETOH maybe once per week    REVIEW OF SYSTEMS:    GU Review Male:   Patient denies frequent urination, hard to postpone urination, burning/ pain with urination, get up at night to urinate, leakage of urine, stream starts and stops, trouble starting your stream, have to strain to urinate , erection problems, and penile pain.  Gastrointestinal (Upper):   Patient reports nausea and vomiting. Patient denies indigestion/ heartburn.  Gastrointestinal (Lower):   Patient denies diarrhea and constipation.  Constitutional:   Patient reports night sweats. Patient denies fever, weight loss, and fatigue.  Skin:   Patient denies skin rash/ lesion and  itching.  Eyes:   Patient denies blurred vision and double vision.  Ears/ Nose/ Throat:   Patient denies sore throat and sinus problems.  Hematologic/Lymphatic:   Patient denies swollen glands and easy bruising.  Cardiovascular:   Patient denies leg swelling and chest pains.  Respiratory:   Patient denies cough and shortness of breath.  Endocrine:   Patient denies excessive thirst.  Musculoskeletal:   Patient reports back pain. Patient denies joint pain.  Neurological:   Patient denies headaches and dizziness.  Psychologic:   Patient denies depression and anxiety.   Notes: Right flank pains    VITAL SIGNS:      10/24/2019 01:30 PM  Weight 259 lb / 117.48 kg  Height 73 in / 185.42 cm  BP 136/79 mmHg  Pulse 73 /min  Temperature 98.2 F / 36.7 C  BMI 34.2 kg/m  Notes: Patient glucose:124   GU PHYSICAL EXAMINATION:    Anus and Perineum: No hemorrhoids. No anal stenosis. No rectal fissure, no anal fissure. No edema, no dimple, no perineal tenderness, no anal tenderness.  Scrotum: No lesions. No edema. No cysts. No warts.  Epididymides: Right: no spermatocele, no masses, no cysts, no tenderness, no induration, no enlargement. Left: no spermatocele, no masses, no cysts, no tenderness, no induration, no enlargement.  Testes: No tenderness, no swelling, no enlargement left testes. No tenderness, no swelling, no enlargement right testes. Normal location left testes. Normal location right testes. No mass, no cyst, no varicocele, no hydrocele left testes. No mass, no cyst, no varicocele, no hydrocele right testes.  Urethral Meatus: Normal size. No lesion, no wart, no discharge, no polyp. Normal location.  Penis: Circumcised, no warts, no cracks. No dorsal Peyronie's plaques, no left corporal Peyronie's plaques, no right corporal Peyronie's plaques, no scarring, no warts. No balanitis, no meatal stenosis.  Prostate: 40 gram or 2+ size. Left lobe normal consistency, right lobe normal consistency.  Symmetrical lobes. No prostate nodule. Left lobe no tenderness, right lobe no tenderness.  Seminal Vesicles: Nonpalpable.  Sphincter Tone: Normal sphincter. No rectal tenderness. No rectal mass.    MULTI-SYSTEM PHYSICAL EXAMINATION:    Constitutional: Well-nourished. No physical deformities. Normally developed. Good grooming.  Neck: Neck symmetrical, not swollen. Normal tracheal position.  Respiratory: No labored breathing, no use of  accessory muscles.   Cardiovascular: Normal temperature, normal extremity pulses, no swelling, no varicosities.  Lymphatic: No enlargement of neck, axillae, groin.  Skin: No paleness, no jaundice, no cyanosis. No lesion, no ulcer, no rash.  Neurologic / Psychiatric: Oriented to time, oriented to place, oriented to person. No depression, no anxiety, no agitation.  Gastrointestinal: No mass, no tenderness, no rigidity, non obese abdomen.  Eyes: Normal conjunctivae. Normal eyelids.  Ears, Nose, Mouth, and Throat: Left ear no scars, no lesions, no masses. Right ear no scars, no lesions, no masses. Nose no scars, no lesions, no masses. Normal hearing. Normal lips.  Musculoskeletal: Normal gait and station of head and neck.     PAST DATA REVIEWED:  Source Of History:  Patient   08/21/19 02/27/19 10/15/18 06/22/18 12/21/17 09/15/17 06/12/17 12/05/16  PSA  Total PSA 6.08 ng/mL 4.89 ng/mL 5.32 ng/mL 4.14 ng/mL 3.63 ng/mL 3.82 ng/mL 3.78 ng/mL 3.38 ng/dl  Free PSA    0.32 ng/mL      % Free PSA    8 % PSA        08/23/06 09/09/04  Hormones  Testosterone, Total 3.65  4.19     PROCEDURES:         C.T. Urogram - [id number]      Patient confirmed No Neulasta OnPro Device.          KUB - [id number]  A single view of the abdomen is obtained.       Patient confirmed No Neulasta OnPro Device.           Urinalysis w/Scope Dipstick Dipstick Cont'd Micro  Color: Yellow Bilirubin: Neg mg/dL WBC/hpf: NS (Not Seen)  Appearance: Clear Ketones: Neg mg/dL RBC/hpf: 20 -  40/hpf  Specific Gravity: 1.025 Blood: 3+ ery/uL Bacteria: Rare (0-9/hpf)  pH: <=5.0 Protein: Neg mg/dL Cystals: NS (Not Seen)  Glucose: Neg mg/dL Urobilinogen: 0.2 mg/dL Casts: NS (Not Seen)    Nitrites: Neg Trichomonas: Not Present    Leukocyte Esterase: Neg leu/uL Mucous: Not Present      Epithelial Cells: NS (Not Seen)      Yeast: NS (Not Seen)      Sperm: Not Present         Ketoralac 30mg  - [id number], [id number] The injection site was sterilely prepped with alcohol. Ketoralac was injected (IM) using standard technique. The patient tolerated the procedure well.   Qty: 30 Adm. By: Audley Hose  Unit: mg Lot No [id number]  Route: IM Exp. Date 04/16/2021  Freq: None Mfgr.:   Site: Right Buttock   ASSESSMENT:      ICD-10 Details  1 GU:   Prostate Cancer - C61   2   Renal calculus - N20.0    PLAN:           Orders X-Rays: C.T. Stone Protocol Without Contrast    KUB    CT shows a cluster of stones in the right proximal ureter with obstruction.  He would like to proceed with ureteroscopy today.

## 2019-10-24 NOTE — Interval H&P Note (Signed)
History and Physical Interval Note:  He has no additional questions.   10/24/2019 5:51 PM  Anthony  [name].  has presented today for surgery, with the diagnosis of RIGHT URETERAL STONE.  The various methods of treatment have been discussed with the patient and family. After consideration of risks, benefits and other options for treatment, the patient has consented to  Procedure(s): CYSTOSCOPY/URETEROSCOPY/HOLMIUM LASER/STENT PLACEMENT (Right) as a surgical intervention.  The patient's history has been reviewed, patient examined, no change in status, stable for surgery.  I have reviewed the patient's chart and labs.  Questions were answered to the patient's satisfaction.     Irine Seal

## 2019-10-24 NOTE — Transfer of Care (Signed)
Immediate Anesthesia Transfer of Care Note  Patient: Anthony Daniel.  Procedure(s) Performed: CYSTOSCOPY/URETEROSCOPY/HOLMIUM LASER/STENT PLACEMENT (Right )  Patient Location: PACU  Anesthesia Type:General  Level of Consciousness: awake and alert   Airway & Oxygen Therapy: Patient Spontanous Breathing and Patient connected to face mask oxygen  Post-op Assessment: Report given to RN and Post -op Vital signs reviewed and stable  Post vital signs: Reviewed and stable  Last Vitals:  Vitals Value Taken Time  BP 146/74 10/24/19 1915  Temp    Pulse 41 10/24/19 1917  Resp 15 10/24/19 1917  SpO2 100 % 10/24/19 1917  Vitals shown include unvalidated device data.  Last Pain:  Vitals:   10/24/19 1657  TempSrc: Oral         Complications: No apparent anesthesia complications

## 2019-10-24 NOTE — Anesthesia Preprocedure Evaluation (Addendum)
Anesthesia Evaluation  Patient identified by MRN, date of birth, ID band Patient awake    Reviewed: Allergy & Precautions, H&P , NPO status , Patient's Chart, lab work & pertinent test results  History of Anesthesia Complications (+) PONV  Airway Mallampati: II  TM Distance: >3 FB Neck ROM: Full    Dental no notable dental hx. (+) Teeth Intact, Dental Advisory Given   Pulmonary sleep apnea , Current Smoker and Patient abstained from smoking.,    Pulmonary exam normal breath sounds clear to auscultation       Cardiovascular hypertension, Pt. on medications  Rhythm:Regular Rate:Normal     Neuro/Psych negative neurological ROS  negative psych ROS   GI/Hepatic negative GI ROS, Neg liver ROS,   Endo/Other  diabetes, Insulin Dependent  Renal/GU Renal disease  negative genitourinary   Musculoskeletal  (+) Arthritis , Osteoarthritis,    Abdominal   Peds  Hematology negative hematology ROS (+)   Anesthesia Other Findings   Reproductive/Obstetrics negative OB ROS                            Anesthesia Physical Anesthesia Plan  ASA: III  Anesthesia Plan: General   Post-op Pain Management:    Induction: Intravenous  PONV Risk Score and Plan: 3 and Ondansetron, Dexamethasone and Midazolam  Airway Management Planned: LMA  Additional Equipment:   Intra-op Plan:   Post-operative Plan: Extubation in OR  Informed Consent: I have reviewed the patients History and Physical, chart, labs and discussed the procedure including the risks, benefits and alternatives for the proposed anesthesia with the patient or authorized representative who has indicated his/her understanding and acceptance.     Dental advisory given  Plan Discussed with: CRNA  Anesthesia Plan Comments:         Anesthesia Quick Evaluation

## 2019-10-24 NOTE — Op Note (Signed)
Procedure: 1. Cystoscopy with right retrograde pyelogram and interpretation. 2. Right ureteroscopy with holmium laser application, stone manipulation and placement of right double-J stent. 3. Application of fluoroscopy.  Preop diagnosis: Right proximal ureteral and renal stones.  Postop diagnosis: Same.  Surgeon: Dr. Irine Seal.  Anesthesia: General.  Specimen: Stone fragments.  Drains: 6 French by 26 cm right contour double-J stent.  EBL: None.  Complications: None.  Indications: The patient is a 69 year old African-American male with a history recurrent urolithiasis who presented to the office today with right flank pain and was found to have a cluster of stones in the right proximal ureter as well as renal stones. He elected to undergo ureteroscopic stone extraction.  Procedure: He was given 2 g of Ancef. A general anesthetic was induced. He was placed in lithotomy position and fitted with PAS hose. His perineum and genitalia were prepped with Hibiclens and he was draped in usual sterile fashion. Latex precautions were in place.  Cystoscopy was performed in a 101 [place] scope and 30 degree lens. Examination revealed a normal urethra. The external sphincter was intact. The prostatic urethra was approximately 2 to 3 cm in length with bilobar hyperplasia with minimal obstruction. Examination of bladder revealed moderate trabeculation. There were no mucosal lesions. There were multiple small stones with appearance consistent of uric acid. The stones were evacuated. Ureteral orifices were unremarkable.  A right retrograde pyelogram was performed using Omnipaque and gentle pressure.   The right retrograde pyelogram demonstrated a normal caliber ureter with a filling defect in the lower proximal ureter consistent with two small stones. I did not inject contrast back to the kidney  but there did not appear to be significant obstruction at this time and after the retrograde some small stones past  from the ureter into the bladder.  A sensor wire was advanced to the kidney and the open-ended catheter and cystoscope were removed. The dual-lumen semirigid 6.5 French ureteroscope was advanced alongside the wire and 2 stones were identified in the lower proximal ureter and were removed to the bladder with an engage basket. The scope was then advanced to the maximum extent no other stones were seen in the ureter.  The semirigid scope was removed and a 12/14 French 35 cm digital access sheath was inserted over the wire into the proximal ureter. The wire and inner core removed. A dual-lumen digital flexible scope was then passed. An additional stone was identified in the renal pelvis and removed with the engage basket. Further inspection revealed a large volume of stones in an mid pole calyx and in the lower pole calyx. The 200 m tract tip laser fiber was then passed in the power setting 0.5 J. The frequency was varied from 10 to 50 Hz depending on need. The midpole calyceal stones were broken into small fragments a few of which were removed but the remainder were felt to be small enough to pass. The lower calyceal stones were more difficult to approach due to angulation within the calyx but some small intact stones were removed and some of the larger stones were fragmented. However large stone burden remained and it was not felt that they would be possible to remove with the ureteroscope.  Ureteroscope was then removed followed by the access sheath and the cystoscope was reinserted over the wire. A six [place] by 26 cm contour double-J stent was then advanced to the kidney under fluoroscopic guidance. The wire was removed, leaving a good coil in the kidney and a  good coil in the bladder.  The stone fragments were then retrieved from the bladder and collected to give the patient to bring to the office. His bladder was partially drained and the cystoscope was removed. He was taken down from lithotomy position,  his anesthetic was reversed and he was moved recovery in stable condition. There were no complications.

## 2019-10-24 NOTE — OR Nursing (Signed)
Stones sent with Dr. Jeffie Pollock.

## 2019-10-31 DIAGNOSIS — N200 Calculus of kidney: Secondary | ICD-10-CM | POA: Diagnosis not present

## 2019-11-06 DIAGNOSIS — N200 Calculus of kidney: Secondary | ICD-10-CM | POA: Diagnosis not present

## 2019-11-06 DIAGNOSIS — C61 Malignant neoplasm of prostate: Secondary | ICD-10-CM | POA: Diagnosis not present

## 2019-11-06 DIAGNOSIS — R809 Proteinuria, unspecified: Secondary | ICD-10-CM | POA: Diagnosis not present

## 2019-11-06 DIAGNOSIS — N528 Other male erectile dysfunction: Secondary | ICD-10-CM | POA: Diagnosis not present

## 2019-11-06 DIAGNOSIS — R310 Gross hematuria: Secondary | ICD-10-CM | POA: Diagnosis not present

## 2019-11-06 DIAGNOSIS — R82998 Other abnormal findings in urine: Secondary | ICD-10-CM | POA: Diagnosis not present

## 2019-11-07 ENCOUNTER — Other Ambulatory Visit: Payer: Self-pay | Admitting: Urology

## 2019-11-07 ENCOUNTER — Other Ambulatory Visit (HOSPITAL_COMMUNITY): Payer: Self-pay | Admitting: Urology

## 2019-11-07 DIAGNOSIS — N200 Calculus of kidney: Secondary | ICD-10-CM | POA: Diagnosis not present

## 2019-11-11 DIAGNOSIS — C61 Malignant neoplasm of prostate: Secondary | ICD-10-CM | POA: Diagnosis not present

## 2019-11-11 DIAGNOSIS — N400 Enlarged prostate without lower urinary tract symptoms: Secondary | ICD-10-CM | POA: Diagnosis not present

## 2019-11-17 ENCOUNTER — Other Ambulatory Visit: Payer: Self-pay | Admitting: Internal Medicine

## 2019-11-17 DIAGNOSIS — I10 Essential (primary) hypertension: Secondary | ICD-10-CM

## 2019-11-19 NOTE — Patient Instructions (Addendum)
DUE TO COVID-19 ONLY ONE VISITOR IS ALLOWED TO COME WITH YOU AND STAY IN THE WAITING ROOM ONLY DURING PRE OP AND PROCEDURE DAY OF SURGERY. THE 1 VISITOR MAY VISIT WITH YOU AFTER SURGERY IN YOUR PRIVATE ROOM DURING VISITING HOURS ONLY!  YOU NEED TO HAVE A COVID 19 TEST ON: 11/22/19 @ 9:25 am, THIS TEST MUST BE DONE BEFORE SURGERY, COME  [place], [place] , [id number].  ([place]) ONCE YOUR COVID TEST IS COMPLETED, PLEASE BEGIN THE QUARANTINE INSTRUCTIONS AS OUTLINED IN YOUR HANDOUT.                [place].   Your procedure is scheduled on: 11/26/19   Report to [facility] Main  Entrance   Report to short stay at: 5:30 AM     Call this number if you have problems the morning of surgery [phone]    Remember: Do not eat food or drink liquids :After Midnight.   BRUSH YOUR TEETH MORNING OF SURGERY AND RINSE YOUR MOUTH OUT, NO CHEWING GUM CANDY OR MINTS.     Take these medicines the morning of surgery with A SIP OF WATER: levocetirizine,tamsulosin.Use Flonase as usual.  How to Manage Your Diabetes Before and After Surgery  Why is it important to control my blood sugar before and after surgery? . Improving blood sugar levels before and after surgery helps healing and can limit problems. . A way of improving blood sugar control is eating a healthy diet by: o  Eating less sugar and carbohydrates o  Increasing activity/exercise o  Talking with your doctor about reaching your blood sugar goals . High blood sugars (greater than 180 mg/dL) can raise your risk of infections and slow your recovery, so you will need to focus on controlling your diabetes during the weeks before surgery. . Make sure that the doctor who takes care of your diabetes knows about your planned surgery including the date and location.  How do I manage my blood sugar before surgery? . Check your blood sugar at least 4 times a day, starting 2 days before surgery, to make sure  that the level is not too high or low. o Check your blood sugar the morning of your surgery when you wake up and every 2 hours until you get to the Short Stay unit. . If your blood sugar is less than 70 mg/dL, you will need to treat for low blood sugar: o Do not take insulin. o Treat a low blood sugar (less than 70 mg/dL) with  cup of clear juice (cranberry or apple), 4 glucose tablets, OR glucose gel. o Recheck blood sugar in 15 minutes after treatment (to make sure it is greater than 70 mg/dL). If your blood sugar is not greater than 70 mg/dL on recheck, call [phone] for further instructions. . Report your blood sugar to the short stay nurse when you get to Short Stay.  . If you are admitted to the hospital after surgery: o Your blood sugar will be checked by the staff and you will probably be given insulin after surgery (instead of oral diabetes medicines) to make sure you have good blood sugar levels. o The goal for blood sugar control after surgery is 80-180 mg/dL.   WHAT DO I DO ABOUT MY DIABETES MEDICATION?  Marland Kitchen Do not take oral diabetes medicines (pills) the morning of surgery.  . THE NIGHT BEFORE SURGERY, take     units of  insulin.       . THE MORNING OF SURGERY, take   units of         insulin.  . The day of surgery, do not take other diabetes injectables, including Byetta (exenatide), Bydureon (exenatide ER), Victoza (liraglutide), or Trulicity (dulaglutide).  . If your CBG is greater than 220 mg/dL, you may take  of your sliding scale  . (correction) dose of insulin.    For patients with insulin pumps: Contact your diabetes doctor for specific instructions before surgery. Decrease basal rates by 20% at midnight the night before your surgery. Note that if your surgery is planned to be longer than 2 hours, your insulin pump will be removed and intravenous (IV) insulin will be started and managed by the nurses and the anesthesiologist. You will be able to restart  your insulin pump once you are awake and able to manage it.  Make sure to bring insulin pump supplies to the hospital with you in case the  site needs to be changed.  Patient Signature:  Date:   Nurse Signature:  Date:   Reviewed and Endorsed by [facility] Patient Education Committee, August 2015     DO NOT TAKE ANY DIABETIC MEDICATIONS DAY OF YOUR SURGERY                               You may not have any metal on your body including hair pins and              piercings  Do not wear jewelry, lotions, powders or perfumes, deodorant             Men may shave face and neck.   Do not bring valuables to the hospital. [place].  Contacts, dentures or bridgework may not be worn into surgery.  Leave suitcase in the car. After surgery it may be brought to your room.     Patients discharged the day of surgery will not be allowed to drive home. IF YOU ARE HAVING SURGERY AND GOING HOME THE SAME DAY, YOU MUST HAVE AN ADULT TO DRIVE YOU HOME AND BE WITH YOU FOR 24 HOURS. YOU MAY GO HOME BY TAXI OR UBER OR ORTHERWISE, BUT AN ADULT MUST ACCOMPANY YOU HOME AND STAY WITH YOU FOR 24 HOURS.  Name and phone number of your driver:  Special Instructions: N/A              Please read over the following fact sheets you were given: _____________________________________________________________________             [facility] - Preparing for Surgery Before surgery, you can play an important role.  Because skin is not sterile, your skin needs to be as free of germs as possible.  You can reduce the number of germs on your skin by washing with CHG (chlorahexidine gluconate) soap before surgery.  CHG is an antiseptic cleaner which kills germs and bonds with the skin to continue killing germs even after washing. Please DO NOT use if you have an allergy to CHG or antibacterial soaps.  If your skin becomes reddened/irritated stop using the CHG and inform your  nurse when you arrive at Short Stay. Do not shave (including legs and underarms) for at least 48 hours prior to the first CHG shower.  You may  shave your face/neck. Please follow these instructions carefully:  1.  Shower with CHG Soap the night before surgery and the  morning of Surgery.  2.  If you choose to wash your hair, wash your hair first as usual with your  normal  shampoo.  3.  After you shampoo, rinse your hair and body thoroughly to remove the  shampoo.                           4.  Use CHG as you would any other liquid soap.  You can apply chg directly  to the skin and wash                       Gently with a scrungie or clean washcloth.  5.  Apply the CHG Soap to your body ONLY FROM THE NECK DOWN.   Do not use on face/ open                           Wound or open sores. Avoid contact with eyes, ears mouth and genitals (private parts).                       Wash face,  Genitals (private parts) with your normal soap.             6.  Wash thoroughly, paying special attention to the area where your surgery  will be performed.  7.  Thoroughly rinse your body with warm water from the neck down.  8.  DO NOT shower/wash with your normal soap after using and rinsing off  the CHG Soap.                9.  Pat yourself dry with a clean towel.            10.  Wear clean pajamas.            11.  Place clean sheets on your bed the night of your first shower and do not  sleep with pets. Day of Surgery : Do not apply any lotions/deodorants the morning of surgery.  Please wear clean clothes to the hospital/surgery center.  FAILURE TO FOLLOW THESE INSTRUCTIONS MAY RESULT IN THE CANCELLATION OF YOUR SURGERY PATIENT SIGNATURE_________________________________  NURSE SIGNATURE__________________________________  ________________________________________________________________________

## 2019-11-20 ENCOUNTER — Other Ambulatory Visit: Payer: Self-pay

## 2019-11-20 ENCOUNTER — Encounter (HOSPITAL_COMMUNITY): Payer: Self-pay

## 2019-11-20 ENCOUNTER — Encounter (HOSPITAL_COMMUNITY)
Admission: RE | Admit: 2019-11-20 | Discharge: 2019-11-20 | Disposition: A | Discharge status: Home / Self Care | Payer: Medicare Other | Source: Ambulatory Visit | Attending: Urology | Admitting: Urology

## 2019-11-20 DIAGNOSIS — Z9641 Presence of insulin pump (external) (internal): Secondary | ICD-10-CM | POA: Insufficient documentation

## 2019-11-20 DIAGNOSIS — Z01812 Encounter for preprocedural laboratory examination: Secondary | ICD-10-CM | POA: Insufficient documentation

## 2019-11-20 DIAGNOSIS — E118 Type 2 diabetes mellitus with unspecified complications: Secondary | ICD-10-CM | POA: Diagnosis not present

## 2019-11-20 LAB — CBC
HCT: 42.2 % (ref 39.0–52.0)
Hemoglobin: 13.9 g/dL (ref 13.0–17.0)
MCH: 30.2 pg (ref 26.0–34.0)
MCHC: 32.9 g/dL (ref 30.0–36.0)
MCV: 91.5 fL (ref 80.0–100.0)
Platelets: 193 10*3/uL (ref 150–400)
RBC: 4.61 MIL/uL (ref 4.22–5.81)
RDW: 14.1 % (ref 11.5–15.5)
WBC: 12.2 10*3/uL — ABNORMAL HIGH (ref 4.0–10.5)
nRBC: 0 % (ref 0.0–0.2)

## 2019-11-20 LAB — BASIC METABOLIC PANEL
Anion gap: 10 (ref 5–15)
BUN: 22 mg/dL (ref 8–23)
CO2: 25 mmol/L (ref 22–32)
Calcium: 8.7 mg/dL — ABNORMAL LOW (ref 8.9–10.3)
Chloride: 102 mmol/L (ref 98–111)
Creatinine, Ser: 1.29 mg/dL — ABNORMAL HIGH (ref 0.61–1.24)
GFR calc Af Amer: >60 mL/min (ref >60)
GFR calc non Af Amer: 57 mL/min — ABNORMAL LOW (ref >60)
Glucose, Bld: 267 mg/dL — ABNORMAL HIGH (ref 70–99)
Potassium: 4.1 mmol/L (ref 3.5–5.1)
Sodium: 137 mmol/L (ref 135–145)

## 2019-11-20 LAB — PSA: Prostatic Specific Antigen: 6.2 ng/mL — ABNORMAL HIGH (ref 0.00–4.00)

## 2019-11-20 LAB — HEMOGLOBIN A1C
Hgb A1c MFr Bld: 7.9 % — ABNORMAL HIGH (ref 4.8–5.6)
Mean Plasma Glucose: 180.03 mg/dL

## 2019-11-20 LAB — GLUCOSE, CAPILLARY: Glucose-Capillary: 287 mg/dL — ABNORMAL HIGH (ref 70–99)

## 2019-11-20 NOTE — Progress Notes (Signed)
Diabetes coordinator was contacted by RN to inform about the pt.  Coordinator is aware that Anthony Daniel has an insulin pump and that he's having surgery on 11/26/19.

## 2019-11-20 NOTE — Progress Notes (Signed)
Pt. Was advised by RN to call Dr. Delrae Rend and ask for pump instructions on regard the upcomming surgery.Pt. verbalized his understanding of the instructions. Pt. Contract for insulin pump was place in the chart.

## 2019-11-20 NOTE — Progress Notes (Signed)
PCP - Dr. Ronnald Ramp T. LOV: 08/28/19. Cardiologist -   Chest x-ray -  EKG - 08/28/19 Stress Test -  ECHO -  Cardiac Cath -   Sleep Study -  CPAP -   Fasting Blood Sugar -  Checks Blood Sugar _____ times a day  Blood Thinner Instructions: Aspirin Instructions: Last Dose:11/16/19  Anesthesia review:   Patient denies shortness of breath, fever, cough and chest pain at PAT appointment   Patient verbalized understanding of instructions that were given to them at the PAT appointment. Patient was also instructed that they will need to review over the PAT instructions again at home before surgery.

## 2019-11-21 LAB — URINE CULTURE: Culture: NO GROWTH

## 2019-11-22 ENCOUNTER — Other Ambulatory Visit (HOSPITAL_COMMUNITY)
Admission: RE | Admit: 2019-11-22 | Discharge: 2019-11-22 | Disposition: A | Discharge status: Home / Self Care | Payer: Medicare Other | Source: Ambulatory Visit | Attending: Urology | Admitting: Urology

## 2019-11-22 ENCOUNTER — Other Ambulatory Visit: Payer: Self-pay | Admitting: Radiology

## 2019-11-22 DIAGNOSIS — Z01812 Encounter for preprocedural laboratory examination: Secondary | ICD-10-CM | POA: Insufficient documentation

## 2019-11-22 DIAGNOSIS — Z20822 Contact with and (suspected) exposure to covid-19: Secondary | ICD-10-CM | POA: Diagnosis not present

## 2019-11-22 LAB — SARS CORONAVIRUS 2 (TAT 6-24 HRS): SARS Coronavirus 2: NEGATIVE

## 2019-11-25 ENCOUNTER — Other Ambulatory Visit: Payer: Self-pay

## 2019-11-25 ENCOUNTER — Encounter (HOSPITAL_COMMUNITY): Payer: Self-pay

## 2019-11-25 ENCOUNTER — Ambulatory Visit (HOSPITAL_COMMUNITY)
Admission: RE | Admit: 2019-11-25 | Discharge: 2019-11-25 | Disposition: A | Discharge status: Home / Self Care | Payer: Medicare Other | Source: Ambulatory Visit | Attending: Urology | Admitting: Urology

## 2019-11-25 DIAGNOSIS — Z91040 Latex allergy status: Secondary | ICD-10-CM | POA: Insufficient documentation

## 2019-11-25 DIAGNOSIS — I129 Hypertensive chronic kidney disease with stage 1 through stage 4 chronic kidney disease, or unspecified chronic kidney disease: Secondary | ICD-10-CM | POA: Insufficient documentation

## 2019-11-25 DIAGNOSIS — Z9641 Presence of insulin pump (external) (internal): Secondary | ICD-10-CM | POA: Insufficient documentation

## 2019-11-25 DIAGNOSIS — Z7982 Long term (current) use of aspirin: Secondary | ICD-10-CM | POA: Insufficient documentation

## 2019-11-25 DIAGNOSIS — Z885 Allergy status to narcotic agent status: Secondary | ICD-10-CM | POA: Insufficient documentation

## 2019-11-25 DIAGNOSIS — Z888 Allergy status to other drugs, medicaments and biological substances status: Secondary | ICD-10-CM | POA: Insufficient documentation

## 2019-11-25 DIAGNOSIS — Z79899 Other long term (current) drug therapy: Secondary | ICD-10-CM | POA: Insufficient documentation

## 2019-11-25 DIAGNOSIS — M1990 Unspecified osteoarthritis, unspecified site: Secondary | ICD-10-CM | POA: Insufficient documentation

## 2019-11-25 DIAGNOSIS — N200 Calculus of kidney: Secondary | ICD-10-CM | POA: Diagnosis not present

## 2019-11-25 DIAGNOSIS — Z794 Long term (current) use of insulin: Secondary | ICD-10-CM | POA: Insufficient documentation

## 2019-11-25 DIAGNOSIS — E785 Hyperlipidemia, unspecified: Secondary | ICD-10-CM | POA: Insufficient documentation

## 2019-11-25 DIAGNOSIS — Z91041 Radiographic dye allergy status: Secondary | ICD-10-CM | POA: Insufficient documentation

## 2019-11-25 DIAGNOSIS — N181 Chronic kidney disease, stage 1: Secondary | ICD-10-CM | POA: Insufficient documentation

## 2019-11-25 HISTORY — PX: IR URETERAL STENT RIGHT NEW ACCESS W/O SEP NEPHROSTOMY CATH: IMG6076

## 2019-11-25 LAB — PROTIME-INR
INR: 1 (ref 0.8–1.2)
Prothrombin Time: 12.5 seconds (ref 11.4–15.2)

## 2019-11-25 LAB — CBC WITH DIFFERENTIAL/PLATELET
Abs Immature Granulocytes: 0.07 10*3/uL (ref 0.00–0.07)
Basophils Absolute: 0.1 10*3/uL (ref 0.0–0.1)
Basophils Relative: 0 %
Eosinophils Absolute: 0 10*3/uL (ref 0.0–0.5)
Eosinophils Relative: 0 %
HCT: 42.9 % (ref 39.0–52.0)
Hemoglobin: 14.3 g/dL (ref 13.0–17.0)
Immature Granulocytes: 0 %
Lymphocytes Relative: 12 %
Lymphs Abs: 2.2 10*3/uL (ref 0.7–4.0)
MCH: 30.2 pg (ref 26.0–34.0)
MCHC: 33.3 g/dL (ref 30.0–36.0)
MCV: 90.5 fL (ref 80.0–100.0)
Monocytes Absolute: 0.4 10*3/uL (ref 0.1–1.0)
Monocytes Relative: 2 %
Neutro Abs: 15.7 10*3/uL — ABNORMAL HIGH (ref 1.7–7.7)
Neutrophils Relative %: 86 %
Platelets: 204 10*3/uL (ref 150–400)
RBC: 4.74 MIL/uL (ref 4.22–5.81)
RDW: 14.2 % (ref 11.5–15.5)
WBC: 18.3 10*3/uL — ABNORMAL HIGH (ref 4.0–10.5)
nRBC: 0 % (ref 0.0–0.2)

## 2019-11-25 LAB — BASIC METABOLIC PANEL
Anion gap: 11 (ref 5–15)
BUN: 21 mg/dL (ref 8–23)
CO2: 24 mmol/L (ref 22–32)
Calcium: 9 mg/dL (ref 8.9–10.3)
Chloride: 103 mmol/L (ref 98–111)
Creatinine, Ser: 1.09 mg/dL (ref 0.61–1.24)
GFR calc Af Amer: >60 mL/min (ref >60)
GFR calc non Af Amer: >60 mL/min (ref >60)
Glucose, Bld: 199 mg/dL — ABNORMAL HIGH (ref 70–99)
Potassium: 4.1 mmol/L (ref 3.5–5.1)
Sodium: 138 mmol/L (ref 135–145)

## 2019-11-25 LAB — GLUCOSE, CAPILLARY: Glucose-Capillary: 211 mg/dL — ABNORMAL HIGH (ref 70–99)

## 2019-11-25 MED ORDER — CEFAZOLIN SODIUM-DEXTROSE 2-4 GM/100ML-% IV SOLN: 2.0000 g | INTRAVENOUS | Status: AC

## 2019-11-25 MED ORDER — LIDOCAINE HCL (PF) 1 % IJ SOLN
INTRAMUSCULAR | Status: AC | PRN
  Administered 2019-11-25: 5 mL
  Administered 2019-11-25: 10 mL
  Administered 2019-11-25: 5 mL

## 2019-11-25 MED ORDER — CEFAZOLIN SODIUM-DEXTROSE 2-4 GM/100ML-% IV SOLN
INTRAVENOUS | Status: AC
  Administered 2019-11-25: 2 g via INTRAVENOUS
  Filled 2019-11-25: qty 100

## 2019-11-25 MED ORDER — FENTANYL CITRATE (PF) 100 MCG/2ML IJ SOLN
INTRAMUSCULAR | Status: AC | PRN
  Administered 2019-11-25 (×4): 50 ug via INTRAVENOUS

## 2019-11-25 MED ORDER — MIDAZOLAM HCL 2 MG/2ML IJ SOLN
INTRAMUSCULAR | Status: AC | PRN
  Administered 2019-11-25 (×6): 1 mg via INTRAVENOUS

## 2019-11-25 MED ORDER — LIDOCAINE HCL 1 % IJ SOLN
INTRAMUSCULAR | Status: AC
  Filled 2019-11-25: qty 20

## 2019-11-25 MED ORDER — ACETAMINOPHEN 500 MG PO TABS
500.0000 mg | ORAL_TABLET | ORAL | Status: DC | PRN
  Administered 2019-11-25: 500 mg via ORAL
  Filled 2019-11-25: qty 1

## 2019-11-25 MED ORDER — IOHEXOL 300 MG/ML  SOLN
50.0000 mL | Freq: Once | INTRAMUSCULAR | Status: AC | PRN
  Administered 2019-11-25: 35 mL

## 2019-11-25 MED ORDER — LACTATED RINGERS IV SOLN: INTRAVENOUS | Status: DC

## 2019-11-25 MED ORDER — FENTANYL CITRATE (PF) 100 MCG/2ML IJ SOLN
INTRAMUSCULAR | Status: AC
  Filled 2019-11-25: qty 4

## 2019-11-25 MED ORDER — MIDAZOLAM HCL 2 MG/2ML IJ SOLN
INTRAMUSCULAR | Status: AC
  Filled 2019-11-25: qty 6

## 2019-11-25 NOTE — Anesthesia Preprocedure Evaluation (Addendum)
Anesthesia Evaluation  Patient identified by MRN, date of birth, ID band Patient awake    Reviewed: Allergy & Precautions, NPO status , Patient's Chart, lab work & pertinent test results  History of Anesthesia Complications (+) PONV and history of anesthetic complications  Airway Mallampati: II  TM Distance: >3 FB Neck ROM: Full    Dental  (+) Teeth Intact, Dental Advisory Given, Caps, Chipped,    Pulmonary sleep apnea , Current SmokerPatient did not abstain from smoking.,    Pulmonary exam normal breath sounds clear to auscultation       Cardiovascular hypertension, Pt. on medications (-) angina(-) CAD and (-) Past MI Normal cardiovascular exam Rhythm:Regular Rate:Normal     Neuro/Psych negative neurological ROS  negative psych ROS   GI/Hepatic negative GI ROS, Neg liver ROS,   Endo/Other  diabetes, Type 1, Insulin DependentObesity   Renal/GU Renal disease   Bladder cancer    Musculoskeletal  (+) Arthritis ,   Abdominal   Peds  Hematology negative hematology ROS (+)   Anesthesia Other Findings Day of surgery medications reviewed with the patient.  Reproductive/Obstetrics                           Anesthesia Physical Anesthesia Plan  ASA: III  Anesthesia Plan: General   Post-op Pain Management:    Induction: Intravenous  PONV Risk Score and Plan: 3 and Dexamethasone, Ondansetron, Treatment may vary due to age or medical condition, Propofol infusion and Midazolam  Airway Management Planned: Oral ETT  Additional Equipment:   Intra-op Plan:   Post-operative Plan: Extubation in OR  Informed Consent: I have reviewed the patients History and Physical, chart, labs and discussed the procedure including the risks, benefits and alternatives for the proposed anesthesia with the patient or authorized representative who has indicated his/her understanding and acceptance.     Dental  advisory given  Plan Discussed with: CRNA  Anesthesia Plan Comments:        Anesthesia Quick Evaluation

## 2019-11-25 NOTE — H&P (Signed)
Referring Physician(s): Irine Seal  Supervising Physician: Markus Daft  Patient Status:  [facility] OP  Chief Complaint: kidney stones   Subjective: Patient familiar to IR service from left nephrostomy/nephroureteral catheter placement in 2017 as well as left retroperitoneal urinoma drain placement in 2017.  He has a history of bilateral nephrolithiasis and is status post right ureteral stent placement and cystoscopy on 10/24/2019.  He presents again today for right percutaneous nephrostomy/nephroureteral catheter placement prior to nephrolithotomy.  He currently denies fever, headache, chest pain, dyspnea, cough, abdominal pain, nausea, vomiting or bleeding.  He does have some mild intermittent flank discomfort.   Past Medical History:  Diagnosis Date  . Arthritis    Back  . Cancer ([place])    bladder  . CKD (chronic kidney disease), stage I   . History of bladder cancer    1998--  TCC  . History of kidney stones   . Hyperlipidemia   . Hypertension   . Insulin pump in place   . LADA (latent autoimmune diabetes in adults), managed as type 1 [facility])    endocrinologist-- dr Buddy Duty  . Nephrolithiasis    bilateral --  nonobstructive per CT  . PONV (postoperative nausea and vomiting)   . Spinal stenosis, cervical region    Past Surgical History:  Procedure Laterality Date  . COLONOSCOPY  2018   x2  . CYSTOSCOPY W/ URETERAL STENT PLACEMENT Right 05/12/2015   Procedure: CYSTOSCOPY WITH STENT REPLACEMENT;  Surgeon: Cleon Gustin, MD;  [facility]: [facility];  Service: Urology;  Laterality: Right;  . CYSTOSCOPY WITH RETROGRADE PYELOGRAM, URETEROSCOPY AND STENT PLACEMENT Right 01/23/2019   Procedure: CYSTOSCOPY WITH RIGHT RETROGRADE PYELOGRAM, URETEROSCOPY AND STENT PLACEMENT;  Surgeon: Irine Seal, MD;  [facility];  Service: Urology;  Laterality: Right;  . CYSTOSCOPY WITH URETEROSCOPY AND STENT PLACEMENT Right 04/23/2015   Procedure: CYSTOSCOPY WITH URETEROSCOPY RETROGRADE  PYELOGRAM AND STENT PLACEMENT;  Surgeon: Cleon Gustin, MD;  [facility];  Service: Urology;  Laterality: Right;  . CYSTOSCOPY/RETROGRADE/URETEROSCOPY/STONE EXTRACTION WITH BASKET Right 05/12/2015   Procedure: CYSTOSCOPY/RETROGRADE/URETEROSCOPY/STONE EXTRACTION WITH BASKET;  Surgeon: Cleon Gustin, MD;  Location: [facility];  Service: Urology;  Laterality: Right;  . CYSTOSCOPY/URETEROSCOPY/HOLMIUM LASER/STENT PLACEMENT Left 09/18/2018   Procedure: CYSTOSCOPY/ LEFT RETROGRADE LEFT URETEROSCOPY [place] LASER/STENT PLACEMENT;  Surgeon: Irine Seal, MD;  [facility];  Service: Urology;  Laterality: Left;  . CYSTOSCOPY/URETEROSCOPY/HOLMIUM LASER/STENT PLACEMENT Right 10/24/2019   Procedure: CYSTOSCOPY/URETEROSCOPY/HOLMIUM LASER/STENT PLACEMENT;  Surgeon: Irine Seal, MD;  Location[facility];  Service: Urology;  Laterality: Right;  . DENTAL SURGERY    . EXTRACORPOREAL SHOCK WAVE LITHOTRIPSY  x2 in  2006//   x2  in 2012  . HOLMIUM LASER APPLICATION Right [id number]   Procedure: HOLMIUM LASER APPLICATION;  Surgeon: Cleon Gustin, MD;  Location: [facility];  Service: Urology;  Laterality: Right;  . HOLMIUM LASER APPLICATION Right [id number]   Procedure: HOLMIUM LASER APPLICATION;  Surgeon: Irine Seal, MD;  [facility];  Service: Urology;  Laterality: Right;  . LAPAROSCOPIC APPENDECTOMY N/A 11/07/2012   Procedure: APPENDECTOMY LAPAROSCOPIC;  Surgeon: Harl Bowie, MD;  Location: [place];  Service: General;  Laterality: N/A;  . NEPHROLITHOTOMY Left 08/06/2015   Procedure: 1ST STAGE  LEFT PERCUTANEOUS NEPHROLITHOTOMY ;  Surgeon: Irine Seal, MD;  Location: [facility];  Service: Urology;  Laterality: Left;  . TONSILLECTOMY  1975  . TRANSURETHRAL RESECTION OF BLADDER TUMOR  1998      Allergies:  Altace [ramipril], Crestor [rosuvastatin], Lipitor [atorvastatin], Contrast media [iodinated diagnostic agents], Iodine, Latex, Oxycodone,  Adhesive [tape], and Peanut-containing drug products  Medications: Prior to Admission medications   Medication Sig Start Date End Date Taking? Authorizing Provider  aspirin EC 81 MG tablet Take 81 mg by mouth daily.    [provider]  Cholecalciferol (VITAMIN D) 2000 units tablet Take 2,000 Units by mouth daily.    [provider]  Ciclopirox 0.77 % gel APPLY TO AFFECTED AREA TWICE A DAY Patient taking differently: Apply 1 application topically as needed (raash).  09/19/19   Janith Lima, MD  ezetimibe (ZETIA) 10 MG tablet Take 1 tablet (10 mg total) by mouth daily. TAKE 1 TABLET BY MOUTH EVERY DAY 08/30/19   Janith Lima, MD  fluticasone [facility]) 50 MCG/ACT nasal spray Place 2 sprays into both nostrils daily. Patient taking differently: Place 2 sprays into both nostrils daily as needed for allergies.  08/07/18   Marrian Salvage, FNP  HYDROcodone-acetaminophen (NORCO/VICODIN) 5-325 MG tablet Take 1 tablet by mouth every 6 (six) hours as needed for moderate pain. 10/24/19 10/23/20  Irine Seal, MD  indapamide (LOZOL) 1.25 MG tablet TAKE 1 TABLET (1.25 MG TOTAL) BY MOUTH DAILY. 11/17/19   Janith Lima, MD  Insulin Glargine (LANTUS SOLOSTAR [place]) Inject 15 Units into the skin 2 (two) times daily as needed. Only uses when pump goes out    [provider]  Insulin Human (INSULIN PUMP) SOLN Inject 1 each into the skin continuous. Novolog insulin    [provider]  irbesartan (AVAPRO) 300 MG tablet Take 1 tablet (300 mg total) by mouth daily. 08/28/19   Janith Lima, MD  levocetirizine (XYZAL) 5 MG tablet Take 1 tablet (5 mg total) by mouth daily as needed for allergies. 08/30/19   Janith Lima, MD  LIVALO 2 MG TABS TAKE 1 TABLET BY MOUTH EVERY MORNING 06/03/19   Janith Lima, MD  metaxalone (SKELAXIN) 400 MG tablet TAKE 1 TABLET (400 MG TOTAL) BY MOUTH 3 (THREE) TIMES DAILY AS NEEDED (PAIN). 06/03/19   Janith Lima, MD  NOVOLOG 100 UNIT/ML injection  Inject 60 Units into the skin See admin instructions. With pump. Through out the day 12/15/15   [provider]  Polyethyl Glycol-Propyl Glycol (SYSTANE OP) Place 1 drop into both eyes every 8 (eight) hours as needed (dry eyes).    [provider]  potassium chloride SA (KLOR-CON) 20 MEQ tablet Take 1 tablet (20 mEq total) by mouth 2 (two) times daily. 08/29/19   Janith Lima, MD  Potassium Citrate 15 MEQ (1620 MG) TBCR Take 1 tablet by mouth 2 (two) times daily.    [provider]  sildenafil (REVATIO) 20 MG tablet Take 20 mg by mouth daily as needed (erectile dysfunction).    [provider]  tamsulosin (FLOMAX) 0.4 MG CAPS capsule Take 1 capsule (0.4 mg total) by mouth daily. Patient taking differently: Take 0.4 mg by mouth every morning.  04/22/15   Carmin Muskrat, MD  indapamide (LOZOL) 1.25 MG tablet TAKE 1 TABLET (1.25 MG TOTAL) BY MOUTH DAILY. 08/06/19   Janith Lima, MD     Vital Signs: BP (!) 149/83 (BP Location: Right Arm)   Pulse 95   Temp 98.3 F (36.8 C) (Oral)   Resp 18   SpO2 98%   Physical Exam awake, alert.  Chest clear to auscultation bilaterally.  Heart with regular rate and rhythm.  Abdomen soft, positive  bowel sounds, nontender.  No lower extremity edema.  Imaging: No results found.  Labs:  CBC: Recent Labs    01/22/19 2219 08/28/19 1619 11/20/19 1325  WBC 12.3* 14.5* 12.2*  HGB 14.1 15.0 13.9  HCT 42.6 45.4 42.2  PLT 172 186.0 193    COAGS: No results for input(s): INR, APTT in the last 8760 hours.  BMP: Recent Labs    01/22/19 2219 08/28/19 1619 10/24/19 1711 11/20/19 1325  NA 136 141 136 137  K 4.6 3.4* 4.4 4.1  CL 103 106 103 102  CO2 24 29 24 25   GLUCOSE 208* 64* 140* 267*  BUN 16 18 25* 22  CALCIUM 8.7* 9.2 8.8* 8.7*  CREATININE 1.37* 1.20 1.96* 1.29*  GFRNONAA 53*  --  34* 57*  GFRAA >60  --  40* >60    LIVER FUNCTION TESTS: Recent Labs    08/30/19 1134  BILITOT 0.3  AST 20  ALT 21    ALKPHOS 98  PROT 7.1  ALBUMIN 4.1    Assessment and Plan: Patient with history of bilateral nephrolithiasis and prior left percutaneous nephrostomy/nephroureteral catheter placement and left retroperitoneal urinoma drain placement in 2017; status post cystoscopy with right ureteral stent placement on 10/24/2019.  He presents today for right percutaneous nephrostomy/nephroureteral catheter placement prior to nephrolithotomy on 5/11.Risks and benefits of right PCN placement was discussed with the patient including, but not limited to, infection, bleeding, significant bleeding causing loss or decrease in renal function or damage to adjacent structures.   All of the patient's questions were answered, patient is agreeable to proceed.  Consent signed and in chart.  Patient has received 13-hour prednisone/Benadryl prep secondary to reported contrast allergy.  LABS PENDING   Electronically Signed: D. Rowe Robert, PA-C 11/25/2019, 8:18 AM   I spent a total of 25 minutes at the the patient's bedside AND on the patient's hospital floor or unit, greater than 50% of which was counseling/coordinating care for right percutaneous nephrostomy/nephroureteral catheter placement

## 2019-11-25 NOTE — Discharge Instructions (Signed)
For any questions or concerns call [phone]; for after hours call  [phone] and ask for on call MD     Percutaneous Nephrostomy, Care After This sheet gives you information about how to care for yourself after your procedure. Your health care provider may also give you more specific instructions. If you have problems or questions, contact your health care provider. What can I expect after the procedure? After the procedure, it is common to have:  Some soreness where the nephrostomy tube was inserted (tube insertion site).  Blood-tinged drainage from the nephrostomy tube for the first 24 hours. Follow these instructions at home: Activity  Return to your normal activities as told by your health care provider. Ask your health care provider what activities are safe for you.  Avoid activities that may cause the nephrostomy tubing to bend.  Do not take baths, swim, or use a hot tub until your health care provider approves. Ask your health care provider if you can take showers. Cover the nephrostomy tube dressing with a watertight covering when you take a shower.  Donot drive for 24 hours if you were given a medicine to help you relax (sedative). Care of the tube insertion site   Follow instructions from your health care provider about how to take care of your tube insertion site. Make sure you: ? Wash your hands with soap and water before you change your bandage (dressing). If soap and water are not available, use hand sanitizer. ? Change your dressing as told by your health care provider. Be careful not to pull on the tube while removing the dressing. ? When you change the dressing, wash the skin around the tube, rinse well, and pat the skin dry.  Check the tube insertion area every day for signs of infection. Check for: ? More redness, swelling, or pain. ? More fluid or blood. ? Warmth. ? Pus or a bad smell. Care of the nephrostomy tube and drainage bag  Always keep the  tubing, the leg bag, or the bedside drainage bags below the level of the kidney so that your urine drains freely.  When connecting your nephrostomy tube to a drainage bag, make sure that there are no kinks in the tubing and that your urine is draining freely. You may want to use an elastic bandage to wrap any exposed tubing that goes from the nephrostomy tube to any of the connecting tubes.  At night, you may want to connect your nephrostomy tube or the leg bag to a larger bedside drainage bag.  Follow instructions from your health care provider about how to empty or change the drainage bag.  Empty the drainage bag when it becomes ? full.  Replace the drainage bag and any extension tubing that is connected to your nephrostomy tube every 3 weeks or as often as told by your health care provider. Your health care provider will explain how to change the drainage bag and extension tubing. General instructions  Take over-the-counter and prescription medicines only as told by your health care provider.  Keep all follow-up visits as told by your health care provider. This is important. Contact a health care provider if:  You have problems with any of the valves or tubing.  You have persistent pain or soreness in your back.  You have more redness, swelling, or pain around your tube insertion site.  You have more fluid or blood coming from your tube insertion site.  Your tube insertion site feels warm to the touch.  You have pus or a bad smell coming from your tube insertion site.  You have increased urine output or you feel burning when urinating. Get help right away if:  You have pain in your abdomen during the first week.  You have chest pain or have trouble breathing.  You have a new appearance of blood in your urine.  You have a fever or chills.  You have back pain that is not relieved by your medicine.  You have decreased urine output.  Your nephrostomy tube comes out. This  information is not intended to replace advice given to you by your health care provider. Make sure you discuss any questions you have with your health care provider. Document Revised: 06/16/2017 Document Reviewed: 04/15/2016 Elsevier Patient Education  [place]. Moderate Conscious Sedation, Adult, Care After These instructions provide you with information about caring for yourself after your procedure. Your health care provider may also give you more specific instructions. Your treatment has been planned according to current medical practices, but problems sometimes occur. Call your health care provider if you have any problems or questions after your procedure. What can I expect after the procedure? After your procedure, it is common:  To feel sleepy for several hours.  To feel clumsy and have poor balance for several hours.  To have poor judgment for several hours.  To vomit if you eat too soon. Follow these instructions at home: For at least 24 hours after the procedure:   Do not: ? Participate in activities where you could fall or become injured. ? Drive. ? Use heavy machinery. ? Drink alcohol. ? Take sleeping pills or medicines that cause drowsiness. ? Make important decisions or sign legal documents. ? Take care of children on your own.  Rest. Eating and drinking  Follow the diet recommended by your health care provider.  If you vomit: ? Drink water, juice, or soup when you can drink without vomiting. ? Make sure you have little or no nausea before eating solid foods. General instructions  Have a responsible adult stay with you until you are awake and alert.  Take over-the-counter and prescription medicines only as told by your health care provider.  If you smoke, do not smoke without supervision.  Keep all follow-up visits as told by your health care provider. This is important. Contact a health care provider if:  You keep feeling nauseous or you keep  vomiting.  You feel light-headed.  You develop a rash.  You have a fever. Get help right away if:  You have trouble breathing. This information is not intended to replace advice given to you by your health care provider. Make sure you discuss any questions you have with your health care provider. Document Revised: 06/16/2017 Document Reviewed: 10/24/2015 Elsevier Patient Education  2020 [employer].

## 2019-11-25 NOTE — Procedures (Signed)
Interventional Radiology Procedure:   Indications: Right kidney stones and scheduled for nephrolithotomy procedures, needs percutaneous access.   Procedure:  Right nephrostogram and placement of two nephroureteral catheters   Findings: Bifid collecting system with multiple stones in an ANTERIOR lower pole calyx.  Access obtained from an upper pole POSTERIOR calyx and second access obtained from POSTERIOR lower pole calyx.  Both catheters advanced into bladder.   Complications: None     EBL: less than 20 ml  Plan: Observe for 3 hours then discharge to home.  Return for surgery tomorrow.     Anthony Schwinn R. Anselm Pancoast, MD  Pager: [phone]

## 2019-11-26 ENCOUNTER — Ambulatory Visit (HOSPITAL_COMMUNITY): Payer: Medicare Other | Admitting: Certified Registered Nurse Anesthetist

## 2019-11-26 ENCOUNTER — Inpatient Hospital Stay (HOSPITAL_COMMUNITY)
Admission: RE | Admit: 2019-11-26 | Discharge: 2019-11-29 | DRG: 660 | Disposition: A | Discharge status: Home / Self Care | Payer: Medicare Other | Attending: Urology | Admitting: Urology

## 2019-11-26 ENCOUNTER — Encounter (HOSPITAL_COMMUNITY): Payer: Self-pay | Admitting: Urology

## 2019-11-26 ENCOUNTER — Ambulatory Visit (HOSPITAL_COMMUNITY): Payer: Medicare Other

## 2019-11-26 ENCOUNTER — Encounter (HOSPITAL_COMMUNITY)
Admission: RE | Disposition: A | Discharge status: Home / Self Care | Payer: Self-pay | Source: Ambulatory Visit | Attending: Urology

## 2019-11-26 DIAGNOSIS — Z91048 Other nonmedicinal substance allergy status: Secondary | ICD-10-CM

## 2019-11-26 DIAGNOSIS — N138 Other obstructive and reflux uropathy: Secondary | ICD-10-CM | POA: Diagnosis not present

## 2019-11-26 DIAGNOSIS — E109 Type 1 diabetes mellitus without complications: Secondary | ICD-10-CM | POA: Diagnosis not present

## 2019-11-26 DIAGNOSIS — E1022 Type 1 diabetes mellitus with diabetic chronic kidney disease: Secondary | ICD-10-CM | POA: Diagnosis present

## 2019-11-26 DIAGNOSIS — Z9049 Acquired absence of other specified parts of digestive tract: Secondary | ICD-10-CM

## 2019-11-26 DIAGNOSIS — Z906 Acquired absence of other parts of urinary tract: Secondary | ICD-10-CM

## 2019-11-26 DIAGNOSIS — E785 Hyperlipidemia, unspecified: Secondary | ICD-10-CM | POA: Diagnosis present

## 2019-11-26 DIAGNOSIS — I129 Hypertensive chronic kidney disease with stage 1 through stage 4 chronic kidney disease, or unspecified chronic kidney disease: Secondary | ICD-10-CM | POA: Diagnosis present

## 2019-11-26 DIAGNOSIS — E669 Obesity, unspecified: Secondary | ICD-10-CM | POA: Diagnosis not present

## 2019-11-26 DIAGNOSIS — N200 Calculus of kidney: Secondary | ICD-10-CM

## 2019-11-26 DIAGNOSIS — N401 Enlarged prostate with lower urinary tract symptoms: Secondary | ICD-10-CM | POA: Diagnosis not present

## 2019-11-26 DIAGNOSIS — F17210 Nicotine dependence, cigarettes, uncomplicated: Secondary | ICD-10-CM | POA: Diagnosis present

## 2019-11-26 DIAGNOSIS — N320 Bladder-neck obstruction: Secondary | ICD-10-CM | POA: Diagnosis present

## 2019-11-26 DIAGNOSIS — Z8546 Personal history of malignant neoplasm of prostate: Secondary | ICD-10-CM

## 2019-11-26 DIAGNOSIS — Z87442 Personal history of urinary calculi: Secondary | ICD-10-CM

## 2019-11-26 DIAGNOSIS — Z6833 Body mass index (BMI) 33.0-33.9, adult: Secondary | ICD-10-CM | POA: Diagnosis not present

## 2019-11-26 DIAGNOSIS — Z8249 Family history of ischemic heart disease and other diseases of the circulatory system: Secondary | ICD-10-CM

## 2019-11-26 DIAGNOSIS — I10 Essential (primary) hypertension: Secondary | ICD-10-CM | POA: Diagnosis not present

## 2019-11-26 DIAGNOSIS — Z79899 Other long term (current) drug therapy: Secondary | ICD-10-CM

## 2019-11-26 DIAGNOSIS — Z91040 Latex allergy status: Secondary | ICD-10-CM

## 2019-11-26 DIAGNOSIS — Z888 Allergy status to other drugs, medicaments and biological substances status: Secondary | ICD-10-CM

## 2019-11-26 DIAGNOSIS — Z91010 Allergy to peanuts: Secondary | ICD-10-CM

## 2019-11-26 DIAGNOSIS — M4802 Spinal stenosis, cervical region: Secondary | ICD-10-CM | POA: Diagnosis present

## 2019-11-26 DIAGNOSIS — G4730 Sleep apnea, unspecified: Secondary | ICD-10-CM | POA: Diagnosis present

## 2019-11-26 DIAGNOSIS — Z885 Allergy status to narcotic agent status: Secondary | ICD-10-CM

## 2019-11-26 DIAGNOSIS — Z419 Encounter for procedure for purposes other than remedying health state, unspecified: Secondary | ICD-10-CM

## 2019-11-26 DIAGNOSIS — Z91041 Radiographic dye allergy status: Secondary | ICD-10-CM

## 2019-11-26 DIAGNOSIS — N181 Chronic kidney disease, stage 1: Secondary | ICD-10-CM | POA: Diagnosis present

## 2019-11-26 DIAGNOSIS — Z9089 Acquired absence of other organs: Secondary | ICD-10-CM

## 2019-11-26 DIAGNOSIS — Z7982 Long term (current) use of aspirin: Secondary | ICD-10-CM

## 2019-11-26 DIAGNOSIS — Z794 Long term (current) use of insulin: Secondary | ICD-10-CM

## 2019-11-26 DIAGNOSIS — Z8551 Personal history of malignant neoplasm of bladder: Secondary | ICD-10-CM

## 2019-11-26 DIAGNOSIS — Z9641 Presence of insulin pump (external) (internal): Secondary | ICD-10-CM | POA: Diagnosis present

## 2019-11-26 HISTORY — PX: NEPHROLITHOTOMY: SHX5134

## 2019-11-26 LAB — TYPE AND SCREEN
ABO/RH(D): A POS
Antibody Screen: NEGATIVE

## 2019-11-26 LAB — HIV ANTIBODY (ROUTINE TESTING W REFLEX): HIV Screen 4th Generation wRfx: NONREACTIVE

## 2019-11-26 LAB — GLUCOSE, CAPILLARY
Glucose-Capillary: 119 mg/dL — ABNORMAL HIGH (ref 70–99)
Glucose-Capillary: 134 mg/dL — ABNORMAL HIGH (ref 70–99)
Glucose-Capillary: 213 mg/dL — ABNORMAL HIGH (ref 70–99)
Glucose-Capillary: 362 mg/dL — ABNORMAL HIGH (ref 70–99)
Glucose-Capillary: 201 mg/dL — ABNORMAL HIGH (ref 70–99)
Glucose-Capillary: 155 mg/dL — ABNORMAL HIGH (ref 70–99)
Glucose-Capillary: 111 mg/dL — ABNORMAL HIGH (ref 70–99)

## 2019-11-26 SURGERY — NEPHROLITHOTOMY PERCUTANEOUS
Anesthesia: General | Laterality: Right

## 2019-11-26 MED ORDER — LIDOCAINE 2% (20 MG/ML) 5 ML SYRINGE
INTRAMUSCULAR | Status: AC
  Filled 2019-11-26: qty 5

## 2019-11-26 MED ORDER — TAMSULOSIN HCL 0.4 MG PO CAPS
0.4000 mg | ORAL_CAPSULE | Freq: Every day | ORAL | Status: DC
  Administered 2019-11-27: 0.4 mg via ORAL
  Filled 2019-11-26: qty 1

## 2019-11-26 MED ORDER — BISACODYL 10 MG RE SUPP: 10.0000 mg | Freq: Every day | RECTAL | Status: DC | PRN

## 2019-11-26 MED ORDER — CEFAZOLIN SODIUM-DEXTROSE 2-4 GM/100ML-% IV SOLN
INTRAVENOUS | Status: AC
  Filled 2019-11-26: qty 100

## 2019-11-26 MED ORDER — HYOSCYAMINE SULFATE 0.125 MG SL SUBL
0.1250 mg | SUBLINGUAL_TABLET | SUBLINGUAL | Status: DC | PRN
  Filled 2019-11-26: qty 1

## 2019-11-26 MED ORDER — MIDAZOLAM HCL 2 MG/2ML IJ SOLN
INTRAMUSCULAR | Status: AC
  Filled 2019-11-26: qty 2

## 2019-11-26 MED ORDER — HYDROMORPHONE HCL 1 MG/ML IJ SOLN
INTRAMUSCULAR | Status: AC
  Administered 2019-11-26: 0.5 mg via INTRAVENOUS
  Filled 2019-11-26: qty 1

## 2019-11-26 MED ORDER — HYDROCODONE-ACETAMINOPHEN 5-325 MG PO TABS
ORAL_TABLET | ORAL | Status: AC
  Filled 2019-11-26: qty 1

## 2019-11-26 MED ORDER — INSULIN PUMP
Freq: Three times a day (TID) | SUBCUTANEOUS | Status: DC
  Filled 2019-11-26: qty 1

## 2019-11-26 MED ORDER — HYDROMORPHONE HCL 1 MG/ML IJ SOLN
0.5000 mg | INTRAMUSCULAR | Status: DC | PRN
  Administered 2019-11-26: 1 mg via INTRAVENOUS
  Administered 2019-11-26: 0.5 mg via INTRAVENOUS
  Administered 2019-11-27 – 2019-11-28 (×4): 1 mg via INTRAVENOUS
  Filled 2019-11-26 (×6): qty 1

## 2019-11-26 MED ORDER — FLEET ENEMA 7-19 GM/118ML RE ENEM: 1.0000 | ENEMA | Freq: Once | RECTAL | Status: DC | PRN

## 2019-11-26 MED ORDER — ACETAMINOPHEN 500 MG PO TABS: 1000.0000 mg | ORAL_TABLET | Freq: Once | ORAL | Status: AC

## 2019-11-26 MED ORDER — SUGAMMADEX SODIUM 200 MG/2ML IV SOLN
INTRAVENOUS | Status: DC | PRN
  Administered 2019-11-26: 200 mg via INTRAVENOUS

## 2019-11-26 MED ORDER — ACETAMINOPHEN 325 MG PO TABS: 650.0000 mg | ORAL_TABLET | ORAL | Status: DC | PRN

## 2019-11-26 MED ORDER — PROPOFOL 10 MG/ML IV BOLUS
INTRAVENOUS | Status: DC | PRN
  Administered 2019-11-26: 150 mg via INTRAVENOUS

## 2019-11-26 MED ORDER — AMISULPRIDE (ANTIEMETIC) 5 MG/2ML IV SOLN: 10.0000 mg | Freq: Once | INTRAVENOUS | Status: DC | PRN

## 2019-11-26 MED ORDER — IRBESARTAN 300 MG PO TABS
300.0000 mg | ORAL_TABLET | Freq: Every day | ORAL | Status: DC
  Administered 2019-11-27: 300 mg via ORAL
  Filled 2019-11-26: qty 1

## 2019-11-26 MED ORDER — FENTANYL CITRATE (PF) 100 MCG/2ML IJ SOLN
INTRAMUSCULAR | Status: AC
  Filled 2019-11-26: qty 2

## 2019-11-26 MED ORDER — INSULIN ASPART 100 UNIT/ML ~~LOC~~ SOLN: 0.0000 [IU] | Freq: Three times a day (TID) | SUBCUTANEOUS | Status: DC

## 2019-11-26 MED ORDER — HYDROMORPHONE HCL 1 MG/ML IJ SOLN
INTRAMUSCULAR | Status: AC
  Filled 2019-11-26: qty 1

## 2019-11-26 MED ORDER — LEVOCETIRIZINE DIHYDROCHLORIDE 5 MG PO TABS: 5.0000 mg | ORAL_TABLET | Freq: Every day | ORAL | Status: DC | PRN

## 2019-11-26 MED ORDER — POLYETHYL GLYCOL-PROPYL GLYCOL 0.4-0.3 % OP GEL: Freq: Three times a day (TID) | OPHTHALMIC | Status: DC | PRN

## 2019-11-26 MED ORDER — SENNOSIDES-DOCUSATE SODIUM 8.6-50 MG PO TABS: 1.0000 | ORAL_TABLET | Freq: Every evening | ORAL | Status: DC | PRN

## 2019-11-26 MED ORDER — ZOLPIDEM TARTRATE 5 MG PO TABS: 5.0000 mg | ORAL_TABLET | Freq: Every evening | ORAL | Status: DC | PRN

## 2019-11-26 MED ORDER — PROPOFOL 500 MG/50ML IV EMUL
INTRAVENOUS | Status: AC
  Filled 2019-11-26: qty 50

## 2019-11-26 MED ORDER — ROCURONIUM BROMIDE 10 MG/ML (PF) SYRINGE
PREFILLED_SYRINGE | INTRAVENOUS | Status: DC | PRN
  Administered 2019-11-26: 60 mg via INTRAVENOUS

## 2019-11-26 MED ORDER — 0.9 % SODIUM CHLORIDE (POUR BTL) OPTIME
TOPICAL | Status: DC | PRN
  Administered 2019-11-26: 1000 mL

## 2019-11-26 MED ORDER — FENTANYL CITRATE (PF) 100 MCG/2ML IJ SOLN
INTRAMUSCULAR | Status: AC
  Administered 2019-11-26: 50 ug via INTRAVENOUS
  Filled 2019-11-26: qty 2

## 2019-11-26 MED ORDER — ONDANSETRON HCL 4 MG/2ML IJ SOLN
INTRAMUSCULAR | Status: AC
  Filled 2019-11-26: qty 2

## 2019-11-26 MED ORDER — METAXALONE 800 MG PO TABS
400.0000 mg | ORAL_TABLET | Freq: Three times a day (TID) | ORAL | Status: DC | PRN
  Filled 2019-11-26: qty 1

## 2019-11-26 MED ORDER — INSULIN PUMP
1.0000 | SUBCUTANEOUS | Status: DC
  Filled 2019-11-26: qty 1

## 2019-11-26 MED ORDER — INSULIN ASPART 100 UNIT/ML ~~LOC~~ SOLN: 60.0000 [IU] | SUBCUTANEOUS | Status: DC

## 2019-11-26 MED ORDER — FENTANYL CITRATE (PF) 100 MCG/2ML IJ SOLN
25.0000 ug | INTRAMUSCULAR | Status: DC | PRN
  Administered 2019-11-26: 25 ug via INTRAVENOUS
  Administered 2019-11-26 (×2): 50 ug via INTRAVENOUS
  Administered 2019-11-26: 25 ug via INTRAVENOUS

## 2019-11-26 MED ORDER — PHENYLEPHRINE 40 MCG/ML (10ML) SYRINGE FOR IV PUSH (FOR BLOOD PRESSURE SUPPORT)
PREFILLED_SYRINGE | INTRAVENOUS | Status: DC | PRN
  Administered 2019-11-26 (×2): 120 ug via INTRAVENOUS
  Administered 2019-11-26 (×2): 80 ug via INTRAVENOUS

## 2019-11-26 MED ORDER — DIPHENHYDRAMINE HCL 50 MG/ML IJ SOLN: 12.5000 mg | Freq: Four times a day (QID) | INTRAMUSCULAR | Status: DC | PRN

## 2019-11-26 MED ORDER — PROPOFOL 500 MG/50ML IV EMUL
INTRAVENOUS | Status: DC | PRN
Start: 2019-11-26 — End: 2019-11-26
  Administered 2019-11-26: 25 ug/kg/min via INTRAVENOUS

## 2019-11-26 MED ORDER — CHLORHEXIDINE GLUCONATE CLOTH 2 % EX PADS: 6.0000 | MEDICATED_PAD | Freq: Every day | CUTANEOUS | Status: DC

## 2019-11-26 MED ORDER — FLUTICASONE PROPIONATE 50 MCG/ACT NA SUSP
2.0000 | Freq: Every day | NASAL | Status: DC | PRN
  Filled 2019-11-26: qty 16

## 2019-11-26 MED ORDER — DEXTROSE-NACL 5-0.45 % IV SOLN: INTRAVENOUS | Status: DC

## 2019-11-26 MED ORDER — LACTATED RINGERS IV SOLN: INTRAVENOUS | Status: DC

## 2019-11-26 MED ORDER — FAMOTIDINE 20 MG PO TABS: 20.0000 mg | ORAL_TABLET | Freq: Once | ORAL | Status: DC

## 2019-11-26 MED ORDER — POTASSIUM CHLORIDE CRYS ER 20 MEQ PO TBCR
20.0000 meq | EXTENDED_RELEASE_TABLET | Freq: Two times a day (BID) | ORAL | Status: DC
  Administered 2019-11-26 – 2019-11-28 (×4): 20 meq via ORAL
  Filled 2019-11-26 (×4): qty 1

## 2019-11-26 MED ORDER — ARTIFICIAL TEARS OPHTHALMIC OINT
TOPICAL_OINTMENT | Freq: Three times a day (TID) | OPHTHALMIC | Status: DC | PRN
  Filled 2019-11-26: qty 3.5

## 2019-11-26 MED ORDER — DEXAMETHASONE SODIUM PHOSPHATE 10 MG/ML IJ SOLN
INTRAMUSCULAR | Status: AC
  Filled 2019-11-26: qty 1

## 2019-11-26 MED ORDER — LORATADINE 10 MG PO TABS: 10.0000 mg | ORAL_TABLET | Freq: Every day | ORAL | Status: DC | PRN

## 2019-11-26 MED ORDER — ROCURONIUM BROMIDE 10 MG/ML (PF) SYRINGE
PREFILLED_SYRINGE | INTRAVENOUS | Status: AC
  Filled 2019-11-26: qty 10

## 2019-11-26 MED ORDER — INDAPAMIDE 1.25 MG PO TABS
1.2500 mg | ORAL_TABLET | Freq: Every day | ORAL | Status: DC
  Administered 2019-11-27 – 2019-11-28 (×2): 1.25 mg via ORAL
  Filled 2019-11-26 (×3): qty 1

## 2019-11-26 MED ORDER — PROPOFOL 10 MG/ML IV BOLUS
INTRAVENOUS | Status: AC
  Filled 2019-11-26: qty 20

## 2019-11-26 MED ORDER — SODIUM CHLORIDE 0.9 % IR SOLN
Status: DC | PRN
  Administered 2019-11-26: 12000 mL

## 2019-11-26 MED ORDER — MIDAZOLAM HCL 5 MG/5ML IJ SOLN
INTRAMUSCULAR | Status: DC | PRN
  Administered 2019-11-26: 2 mg via INTRAVENOUS

## 2019-11-26 MED ORDER — CEFAZOLIN SODIUM-DEXTROSE 1-4 GM/50ML-% IV SOLN
1.0000 g | Freq: Three times a day (TID) | INTRAVENOUS | Status: DC
  Administered 2019-11-26 – 2019-11-29 (×9): 1 g via INTRAVENOUS
  Filled 2019-11-26 (×9): qty 50

## 2019-11-26 MED ORDER — CEFAZOLIN SODIUM-DEXTROSE 2-4 GM/100ML-% IV SOLN
2.0000 g | INTRAVENOUS | Status: AC
  Administered 2019-11-26: 2 g via INTRAVENOUS

## 2019-11-26 MED ORDER — CEFAZOLIN SODIUM-DEXTROSE 1-4 GM/50ML-% IV SOLN
INTRAVENOUS | Status: AC
  Filled 2019-11-26: qty 50

## 2019-11-26 MED ORDER — ONDANSETRON HCL 4 MG/2ML IJ SOLN
4.0000 mg | INTRAMUSCULAR | Status: DC | PRN
  Administered 2019-11-27 – 2019-11-28 (×5): 4 mg via INTRAVENOUS
  Filled 2019-11-26 (×5): qty 2

## 2019-11-26 MED ORDER — HYDROCODONE-ACETAMINOPHEN 5-325 MG PO TABS
1.0000 | ORAL_TABLET | ORAL | Status: DC | PRN
  Administered 2019-11-26: 1 via ORAL

## 2019-11-26 MED ORDER — EZETIMIBE 10 MG PO TABS
10.0000 mg | ORAL_TABLET | Freq: Every day | ORAL | Status: DC
  Administered 2019-11-27: 10 mg via ORAL
  Filled 2019-11-26: qty 1

## 2019-11-26 MED ORDER — IOHEXOL 300 MG/ML  SOLN
INTRAMUSCULAR | Status: DC | PRN
  Administered 2019-11-26: 5 mL

## 2019-11-26 MED ORDER — LIDOCAINE 2% (20 MG/ML) 5 ML SYRINGE
INTRAMUSCULAR | Status: DC | PRN
  Administered 2019-11-26: 80 mg via INTRAVENOUS

## 2019-11-26 MED ORDER — ACETAMINOPHEN 10 MG/ML IV SOLN
INTRAVENOUS | Status: AC
  Filled 2019-11-26: qty 100

## 2019-11-26 MED ORDER — ACETAMINOPHEN 500 MG PO TABS
ORAL_TABLET | ORAL | Status: AC
  Administered 2019-11-26: 1000 mg via ORAL
  Filled 2019-11-26: qty 2

## 2019-11-26 MED ORDER — POTASSIUM CITRATE ER 15 MEQ (1620 MG) PO TBCR: 1.0000 | EXTENDED_RELEASE_TABLET | Freq: Two times a day (BID) | ORAL | Status: DC

## 2019-11-26 MED ORDER — ONDANSETRON HCL 4 MG/2ML IJ SOLN
INTRAMUSCULAR | Status: DC | PRN
  Administered 2019-11-26: 4 mg via INTRAVENOUS

## 2019-11-26 MED ORDER — DEXAMETHASONE SODIUM PHOSPHATE 4 MG/ML IJ SOLN
INTRAMUSCULAR | Status: DC | PRN
  Administered 2019-11-26: 5 mg via INTRAVENOUS

## 2019-11-26 MED ORDER — GLYCOPYRROLATE PF 0.2 MG/ML IJ SOSY
PREFILLED_SYRINGE | INTRAMUSCULAR | Status: AC
  Filled 2019-11-26: qty 1

## 2019-11-26 MED ORDER — DIPHENHYDRAMINE HCL 12.5 MG/5ML PO ELIX: 12.5000 mg | ORAL_SOLUTION | Freq: Four times a day (QID) | ORAL | Status: DC | PRN

## 2019-11-26 MED ORDER — STERILE WATER FOR IRRIGATION IR SOLN
Status: DC | PRN
  Administered 2019-11-26: 500 mL

## 2019-11-26 MED ORDER — FENTANYL CITRATE (PF) 100 MCG/2ML IJ SOLN
INTRAMUSCULAR | Status: DC | PRN
  Administered 2019-11-26 (×2): 50 ug via INTRAVENOUS
  Administered 2019-11-26: 100 ug via INTRAVENOUS

## 2019-11-26 MED ORDER — HYDROCODONE-ACETAMINOPHEN 5-325 MG PO TABS: 1.0000 | ORAL_TABLET | Freq: Four times a day (QID) | ORAL | 0 refills | Status: DC | PRN

## 2019-11-26 SURGICAL SUPPLY — 46 items
BAG URINE DRAIN 2000ML AR STRL (UROLOGICAL SUPPLIES) ×3 IMPLANT
BASKET ZERO TIP NITINOL 2.4FR (BASKET) IMPLANT
BENZOIN TINCTURE PRP APPL 2/3 (GAUZE/BANDAGES/DRESSINGS) ×3 IMPLANT
BLADE SURG 15 STRL LF DISP TIS (BLADE) ×1 IMPLANT
BLADE SURG 15 STRL SS (BLADE) ×3
CATH AINSWORTH 30CC 24FR (CATHETERS) IMPLANT
CATH FOLEY LATEX FREE 22FR (CATHETERS) ×3
CATH FOLEY LF 22FR (CATHETERS) ×1 IMPLANT
CATH ROBINSON RED A/P 20FR (CATHETERS) IMPLANT
CATH URET 5FR 28IN OPEN ENDED (CATHETERS) ×3 IMPLANT
CATH URET DUAL LUMEN 6-10FR 50 (CATHETERS) ×3 IMPLANT
CATH UROLOGY TORQUE 40 (MISCELLANEOUS) ×3 IMPLANT
CATH X-FORCE N30 NEPHROSTOMY (TUBING) ×3 IMPLANT
CHLORAPREP W/TINT 26 (MISCELLANEOUS) ×3 IMPLANT
COVER SURGICAL LIGHT HANDLE (MISCELLANEOUS) IMPLANT
COVER WAND RF STERILE (DRAPES) IMPLANT
DRAPE C-ARM 42X120 X-RAY (DRAPES) ×3 IMPLANT
DRAPE LINGEMAN PERC (DRAPES) ×3 IMPLANT
DRAPE SURG IRRIG POUCH 19X23 (DRAPES) ×3 IMPLANT
DRSG PAD ABDOMINAL 8X10 ST (GAUZE/BANDAGES/DRESSINGS) ×3 IMPLANT
DRSG TEGADERM 8X12 (GAUZE/BANDAGES/DRESSINGS) ×3 IMPLANT
EXTRACTOR STONE NITINOL NGAGE (UROLOGICAL SUPPLIES) ×3 IMPLANT
FIBER LASER FLEXIVA 365 (UROLOGICAL SUPPLIES) IMPLANT
FIBER LASER TRAC TIP (UROLOGICAL SUPPLIES) IMPLANT
GAUZE SPONGE 4X4 12PLY STRL (GAUZE/BANDAGES/DRESSINGS) ×3 IMPLANT
GLOVE SURG SS PI 8.0 STRL IVOR (GLOVE) ×3 IMPLANT
GOWN STRL REUS W/TWL XL LVL3 (GOWN DISPOSABLE) ×3 IMPLANT
GUIDEWIRE AMPLAZ .035X145 (WIRE) ×6 IMPLANT
KIT BASIN (CUSTOM PROCEDURE TRAY) ×3 IMPLANT
KIT PROBE TRILOGY 3.9X350 (MISCELLANEOUS) ×3 IMPLANT
KIT TURNOVER KIT A (KITS) IMPLANT
MANIFOLD NEPTUNE II (INSTRUMENTS) ×3 IMPLANT
NS IRRIG 1000ML POUR BTL (IV SOLUTION) ×3 IMPLANT
SPONGE LAP 4X18 RFD (DISPOSABLE) ×3 IMPLANT
SUT SILK 2 0 30  PSL (SUTURE) ×3
SUT SILK 2 0 30 PSL (SUTURE) ×1 IMPLANT
SYR 10ML LL (SYRINGE) ×3 IMPLANT
SYR 20ML LL LF (SYRINGE) ×6 IMPLANT
TOWEL OR 17X26 10 PK STRL BLUE (TOWEL DISPOSABLE) ×3 IMPLANT
TOWEL OR NON WOVEN STRL DISP B (DISPOSABLE) ×3 IMPLANT
TRAY CYSTO PACK (CUSTOM PROCEDURE TRAY) ×3 IMPLANT
TRAY FOLEY MTR SLVR 16FR STAT (SET/KITS/TRAYS/PACK) ×3 IMPLANT
TUBING CONNECTING 10 (TUBING) ×4 IMPLANT
TUBING CONNECTING 10' (TUBING) ×2
TUBING STONE CATCHER TRILOGY (MISCELLANEOUS) ×3 IMPLANT
TUBING UROLOGY SET (TUBING) ×3 IMPLANT

## 2019-11-26 NOTE — Anesthesia Procedure Notes (Deleted)
Spinal  Patient location during procedure: OR Start time: 11/26/2019 10:12 AM End time: 11/26/2019 10:16 AM Staffing Performed: anesthesiologist  Anesthesiologist: Lyn Hollingshead, MD Preanesthetic Checklist Completed: patient identified, IV checked, site marked, risks and benefits discussed, surgical consent, monitors and equipment checked, pre-op evaluation and timeout performed Spinal Block Patient position: sitting Prep: DuraPrep and site prepped and draped Patient monitoring: continuous pulse ox and blood pressure Approach: right paramedian Location: L3-4 Injection technique: single-shot Needle Needle type: Pencan  Needle gauge: 24 G Needle length: 10 cm Needle insertion depth: 6 cm Assessment Sensory level: T6

## 2019-11-26 NOTE — Discharge Instructions (Signed)
Percutaneous Nephrolithotomy, Care After This sheet gives you information about how to care for yourself after your procedure. Your health care provider may also give you more specific instructions. If you have problems or questions, contact your health care provider. What can I expect after the procedure? After the procedure, it is common to have:  Soreness or pain.  A small amount of blood or clear fluid coming from your incision for a few days.  Fatigue.  Some blood in your urine. This will last for a few days. Follow these instructions at home: Medicines  Take over-the-counter and prescription medicines only as told by your health care provider.  If you were prescribed an antibiotic medicine, take it as told by your health care provider. Do not stop using the antibiotic even if you start to feel better.  Ask your health care provider if the medicine prescribed to you: ? Requires you to avoid driving or using heavy machinery. ? Can cause constipation. You may need to take these actions to prevent or treat constipation:  Drink enough fluid to keep your urine pale yellow.  Take over-the-counter or prescription medicines.  Eat foods that are high in fiber, such as beans, whole grains, and fresh fruits and vegetables.  Limit foods that are high in fat and processed sugars, such as fried or sweet foods. Incision care   Follow instructions from your health care provider about how to take care of your incision. Make sure you: ? Wash your hands with soap and water before and after you change your bandage (dressing). If soap and water are not available, use hand sanitizer. ? Change your dressing as told by your health care provider. ? Leave stitches (sutures), skin glue, or adhesive strips in place. These skin closures may need to stay in place for 2 weeks or longer. If adhesive strip edges start to loosen and curl up, you may trim the loose edges. Do not remove adhesive strips  completely unless your health care provider tells you to do that.  Check your incision area every day for signs of infection. Check for: ? Redness, swelling, or pain. ? More fluid or blood. ? Warmth. ? Pus or a bad smell.  Do not take baths, swim, or use a hot tub until your health care provider approves. Ask your health care provider if you may take showers. You may only be allowed to take sponge baths. Activity  Avoid strenuous activities for as long as told by your health care provider.  Return to your normal activities as told by your health care provider. Ask your health care provider what activities are safe for you. General instructions  If you were sent home with a catheter or surgical drain (nephrostomy tube), follow your health care provider's instructions on how to take care of it.  Wear compression stockings as told by your health care provider. These stockings help to prevent blood clots and reduce swelling in your legs.  Do not use any products that contain nicotine or tobacco, such as cigarettes, e-cigarettes, and chewing tobacco. These can delay incision healing after surgery. If you need help quitting, ask your health care provider.  Keep all follow-up visits as told by your health care provider. This is important. ? If you still have a stent, your health care provider will need to remove it after 1-2 weeks. Contact a health care provider if:  You have a fever.  You have redness, swelling, or pain around your incision.  You have more  fluid or blood coming from your incision. °· Your incision feels warm to the touch. °· You have pus or a bad smell coming from your incision. °· You lose your appetite. °· You feel nauseous or you vomit. °· You have been sent home with a urinary catheter or a surgical drain and urine flow suddenly stops, followed by kidney pain. °Get help right away if: °· You notice a large amount of blood in your urine. °· You have blood clots in your  urine. °· You cannot urinate. °· You have chest pain or trouble breathing. °Summary °· After the procedure, it is common to feel soreness and discomfort. You may also see blood in your urine. °· You will be told how to care for yourself after the procedure. Follow instructions on how to care for your incision and how to recognize signs of infection. °· Avoid activities that require great effort. Return to activities as told by your health care provider. °· Get help right away if you have blood clots in your urine, you cannot urinate, or you have trouble breathing. °This information is not intended to replace advice given to you by your health care provider. Make sure you discuss any questions you have with your health care provider. °Document Revised: 01/24/2018 Document Reviewed: 01/24/2018 °Elsevier Patient Education © 2020 Elsevier Inc. ° °

## 2019-11-26 NOTE — Op Note (Addendum)
Procedure: 1.  First stage right percutaneous nephrolithotomy for less than 2 cm stone. 2.  Right antegrade nephrostogram with interpretation. 3.  Application of fluoroscopy.  Preop diagnosis: right renal stones.  Postop diagnosis: Same.  Surgeon: Dr. Irine Seal.  Anesthesia: General.  Specimen: Stones.  Drains: 22 French silicone Foley catheter with coaxial 5 French ureteral catheter, 6 French nephrostomy catheter and Foley catheter.  EBL: 20 mL.  Complications: None.  Indications: The patient is a 69 year old male with history recurrent urolithiasis who recently underwent ureteroscopy for right proximal ureteral stones.  He had multiple rightrenal stones but was only partially cleared with ureteroscopy.  His stones primarily radiolucent but some of the lower pole stones are visible on plain film.  It was felt the percutaneous nephrostomy was most appropriate for managing his residual stones which are distributed in lower mid and upper calyceal areas of the kidneys with the primary burden in the lower calyx.  Procedure: He had been taken to the interventional radiology suite on 11/25/2019 and had placement of both lower and upper pole access catheters.  He was taken operating room on 11/26/2019.  He was given antibiotics.  A general anesthetic was induced on the holding room stretcher.  A Foley catheter was placed using sterile technique.  He was then rolled prone on chest rolls with great care taken to pad all pressure points.  He was fitted with PAS hose.  His nephrostomy site was prepped with ChloraPrep he was draped in usual sterile fashion after appropriate drying time.  After review of his recent imaging from the nephrostomy tube placement I elected to initiate the procedures to the upper pole access.  An Amplatz Super Stiff guidewire was placed through the upper pole access catheter and advanced to the bladder under fluoroscopic guidance.  The access catheter was removed and the  incision was lengthened to 1 cm.  A dual-lumen 8 French catheter was passed over the wire into the proximal ureter and a second Super Stiff wire was negotiated into the bladder.  The dual-lumen catheter was removed and the nephrostomy balloon was passed.  The balloon was inflated to 25 atm and the nephrostomy access sheath was advanced over the balloon into the upper pole pelvis.  There was minimal bleeding.  An initial passage of the rigid nephroscope was performed and grasping forceps were used to remove several small pieces of old clot.  A few small stones were identified and removed with a grasping forceps.  The previously placed double-J ureteral stent was removed to provide more room to work.  The rigid scope was exchanged for the flexible scope which was negotiated into the upper pole calyceal system where several small stones were identified.  Several of the stones were flushed into the renal pelvis but a number of them were also removed using the engage basket.  I then advanced the flexible scope into the lower calyceal system where the bulk of the stone were identified.  Several stones were removed using the engage basket with the largest approximately 9 mm.  All the stones were round with a yellowish cast consistent with uric acid.  Multiple stones were then flushed into the renal pelvis.  Several of these stones were removed with the engage basket.  I then replaced the rigid nephroscope and inserted the trilogy device with the settings placed on ultrasonic only.  Several small stones were evacuated from the renal pelvis using the trilogy.  Additionally multiple stones have been irrigated out through the nephrostomy  tract and collected in the pouch.  Final inspection demonstrated only minimal tiny stones that were too small to grasp with any of our grasping instruments.  The nephroscope was then removed and final fluoroscopy demonstrated no obvious residual radiolucent stones.  The safety wire  vitamin placed in the upper pole access tract was removed.  A 22 French silicone catheter was prepared by removing the tip and inserting a 5 [place] opening catheter in a coaxial fashion through the catheter.  The ureteral catheter was then passed over the wire and advanced into the distal ureter with the tip of the Foley catheter in the renal pelvis.  Contrast was instilled confirming good position in the renal pelvis of the Foley catheter, no extravasation and good antegrade flow.  The balloon of the catheter was then inflated with 3 mL of sterile water.  The access sheath was removed and the skin incision was tightened around the nephrostomy catheter using a 2-0 silk suture which was then used to carry the nephrostomy tube to the skin.  There was minimal bleeding from the nephrostomy site.  The access sheath was cut away from the catheter and the catheter was placed to straight drainage.  The lower pole access catheter was left in place.  The drapes were removed and a dressing was applied.  He was then rolled supine on the recovery room stretcher and his anesthetic was reversed.  He was moved recovery in stable condition.  There were no complications.  The stone fragments were given to the patient.  His stone burden was less than 2 cm and greatest diameter.

## 2019-11-26 NOTE — Anesthesia Procedure Notes (Signed)
Procedure Name: Intubation Date/Time: 11/26/2019 9:05 AM Performed by: Claudia Desanctis, CRNA Pre-anesthesia Checklist: Patient identified, Emergency Drugs available, Suction available and Patient being monitored Patient Re-evaluated:Patient Re-evaluated prior to induction Oxygen Delivery Method: Circle system utilized Preoxygenation: Pre-oxygenation with 100% oxygen Induction Type: IV induction Ventilation: Mask ventilation without difficulty Laryngoscope Size: 2 and Miller Grade View: Grade II Tube type: Oral Number of attempts: 1 Airway Equipment and Method: Stylet Placement Confirmation: ETT inserted through vocal cords under direct vision,  positive ETCO2 and breath sounds checked- equal and bilateral Secured at: 22 cm Tube secured with: Tape Dental Injury: Teeth and Oropharynx as per pre-operative assessment

## 2019-11-26 NOTE — Transfer of Care (Signed)
Immediate Anesthesia Transfer of Care Note  Patient: Anthony Daniel.  Procedure(s) Performed: NEPHROLITHOTOMY PERCUTANEOUS (Right )  Patient Location: PACU  Anesthesia Type:General  Level of Consciousness: drowsy  Airway & Oxygen Therapy: Patient Spontanous Breathing and Patient connected to face mask  Post-op Assessment: Report given to RN and Post -op Vital signs reviewed and stable  Post vital signs: Reviewed and stable  Last Vitals:  Vitals Value Taken Time  BP 128/71 11/26/19 1043  Temp    Pulse    Resp    SpO2    Vitals shown include unvalidated device data.  Last Pain:  Vitals:   11/26/19 0617  TempSrc: Oral  PainSc:       Patients Stated Pain Goal: 4 ([id number])  Complications: No apparent anesthesia complications

## 2019-11-26 NOTE — Anesthesia Postprocedure Evaluation (Signed)
Anesthesia Post Note  Patient: Anthony Daniel.  Procedure(s) Performed: NEPHROLITHOTOMY PERCUTANEOUS (Right )     Patient location during evaluation: PACU Anesthesia Type: General Level of consciousness: awake and sedated Pain management: pain level not controlled Vital Signs Assessment: post-procedure vital signs reviewed and stable Respiratory status: spontaneous breathing Cardiovascular status: stable Postop Assessment: no apparent nausea or vomiting Anesthetic complications: no    Last Vitals:  Vitals:   11/26/19 1130 11/26/19 1145  BP: (!) 144/78 (!) 161/86  Pulse: 68 63  Resp: 14 18  Temp: 36.5 C 36.5 C  SpO2: 100% 97%    Last Pain:  Vitals:   11/26/19 1158  TempSrc:   PainSc: 7    Pain Goal: Patients Stated Pain Goal: 4 (11/26/19 0556)                 [name]

## 2019-11-26 NOTE — H&P (Signed)
I have had kidney stone surgery.  HPI: Anthony Daniel is a 69 year-old male established patient who is here for renal calculi after a surgical intervention.  The stone was on the right side. He had Stent and Ureteroscopy for treatment of his renal calculi. Patient denies ESWL and PCNL. This procedure was done 08-Nov-2019. He does have a stent in place.   He is not currently having flank pain, back pain, groin pain, nausea, vomiting, fever or chills.   He does not have dysuria. He does have urgency. He does not have frequency.   11/07/19: Anthony Daniel returns today in f/u to discuss options for management of his remaining right renal stones. He is s/p Right Ureteroscopy for obstructing ureteral stones and has a stent. An attempt was made to fragement several renal stones but anatomy and bleeding precluded complete fragmentation. A post op CT shows residual stones in 3 lower calyces on the right and scattered small left renal stones. The largest stone is about 78mm. He is on potassium citrate and indapamide and his stones were 50% uric acid and 50% CaOx on prior testing. He has had a metabolic w/u in the past but not recently. He has had a prior left PCNL, multiple lithotripsies and ureteroscopies. He is getting frustrated with his stone disease.   His urologic history is also significant for Low risk prostate cancer with his last biopsy in 2/20. He has a history of bladder cancer in the 1990's without subsequent recurrence. He has BPH with BOO and had been on tamsulosin but not currently. He has ED and has been on sildenafil.     ALLERGIES: Contrast Media Ready-Box MISC - Nausea Iodine - Nausea Latex Gloves MISC - Skin Rash Livalo TABS - cramping oxycodone - Nausea rosuvastatin - cramping Statins     MEDICATIONS: Potassium Citrate Er 15 meq (1,620 mg) tablet, extended release 1 tablet PO BID  Ecotrin 325 mg tablet, delayed release Oral  Fluticasone Propionate 50 mcg/actuation spray, suspension Nasal   HumaLOG 100 UNIT/ML Subcutaneous Solution Subcutaneous  Indapamide 1.25 mg tablet 1 tablet PO Daily  Irbesartan  Sildenafil Citrate 20 mg tablet 1-5 tablets as needed  Systane Ultra SOLN Ophthalmic  Zetia 10 mg tablet Oral     GU PSH: Cysto Remove Stent FB Sim - 01/30/2019 ESWL - 2012, 2012, 2008 Percut Stone Removal >2cm - 2017 Prostate Needle Biopsy - 08/22/2018 Ureteroscopic laser litho, Right - 11-08-19, Right - 01/23/2019, Left - 09/18/2018, 2016       PSH Notes: Dental Surgery, Percutaneous Lithotomy For Stone Over 2cm., Cystoscopy Ureteroscopy Lithotripsy Incl Insert Indwelling Ureter Stent, Preventive medication therapy needed, Lithotripsy, Lithotripsy, Lithotripsy, Cystoscopy Bladder Tumor   NON-GU PSH: Surgical Pathology, Gross And Microscopic Examination For Prostate Needle - 08/22/2018     GU PMH: Renal calculus - 15-Nov-2019, - November 08, 2019, He has passed several stones since his last visit and the stone burden appears reduced on KUB today. He is on indapamide from his PCP for his HTN and that should help with the stones as well. He will return in 6 months with a KUB. , - 08/26/2019, He has reduced stone burden but is not completely free of stones. He will continue the potassium citrate and I reviewed the dietary restrictions with him as well. BMP and KUB in 6 months. , - 03/06/2019, He has no hydro but has bilateral residual stone disease. I am going to have him return in 3 months with a KUB and we will discuss ESWL for  the RLP stone at that time. , - 10/22/2018, Nephrolithiasis, - 2017, Kidney stone on left side, - 2017, Kidney stone on right side, - 2016 Prostate Cancer - 10/24/2019, His PSA is up to 6.08. I am going to repeat the PSA in 3 months and if it continues to rise, I will get him set up for a surveillance biopsy. He had very low risk disease on his biopsy a year ago. , - 08/26/2019, He has very low risk disease and his PSA is coming back down. Repeat PSA and exam in 6 months. , -  03/06/2019, His PSA jumped but that is most consistent with a reaction to the biopsy. I will repeat in 3 months. , - 10/22/2018, He has very low risk, Gleason 6(3+3) T1c Nx Mx prostate cancer with BPH with moderate LUTS and ED. I discussed Active surveillance, RALP, EXRT and Seeds and briefly mentioned cryotherapy and HIFU. He is an excellent candidate for active surveillance and would like to pursue that option for the time being. , - 09/13/2018 Ureteral calculus - 10/24/2019 Elevated PSA - 08/22/2018, His PSA continues to rise slowly. I will repeat this again in 3-4 months and if it continues to rise we will need to consider a repeat biopsy., - 06/27/2018, His PSA is down a bit. I will repeat in 6 months. , - 12/27/2017, His PSA is minimally changed. I will have him return in 3 months with another PSA with a week of abstinence since he didn't do that this time. , - 2019, His PSA is up a bit more. THE MRIP was negative earlier this year and he had a negative biopsy in 2015. I will repeat a PSA in 3 months with a week of abstinence. , - 06/19/2017 (Worsening), His PSA is up again with a low f/t ratio. I am going to get an MRI and will do a fusion biopsy if positive. If negative, he will return in 6 months with a PSA. , - 2018 (Stable), His PSA has come down from the 4 level. His PSADT is about 4 years. , - 2017, Elevated prostate specific antigen (PSA), - 2017 BPH w/LUTS, Benign prostatic hyperplasia with urinary obstruction - 2017 ED due to arterial insufficiency, Erectile dysfunction due to arterial insufficiency - 2017 Weak Urinary Stream, Weak urinary stream - 2017 Other microscopic hematuria, Microscopic hematuria - 2016 Microscopic hematuria, Asymptomatic microscopic hematuria - 2016 History of bladder cancer, Bladder Cancer - 2014      PMH Notes:  2008-06-06 12:27:08 - Note: Nephrolithiasis Of The Left Kidney   NON-GU PMH: Tinea cruris, HE has a new complaint of a groin rash that is probably candidal with  his history of diabetes. Nystop sent to the pharmacy. - 06/19/2017 Encounter for general adult medical examination without abnormal findings, Encounter for preventive health examination - 2017 Personal history of other diseases of the circulatory system, History of hypertension - 2014 Personal history of other endocrine, nutritional and metabolic disease, History of type 2 diabetes mellitus - 2014, History of hypercholesterolemia, - 2014    FAMILY HISTORY: Atrial Fibrillation - Father Cardiac Failure - Father Family Health Status Number - Runs In Family    Notes: CaP father    SOCIAL HISTORY: Marital Status: Married Preferred Language: English; Race: Black or African American Current Smoking Status: Patient smokes. Has smoked since 03/18/1986. Smokes 1/2 pack per day.  Does not use smokeless tobacco. Social Drinker.  Does not use drugs. Drinks 2 caffeinated drinks per day. Has not had  a blood transfusion. Patient's occupation is/was Retired.     Notes: ETOH maybe once per week    REVIEW OF SYSTEMS:    GU Review Male:   Patient denies frequent urination, hard to postpone urination, burning/ pain with urination, get up at night to urinate, leakage of urine, stream starts and stops, trouble starting your stream, have to strain to urinate , erection problems, and penile pain.  Gastrointestinal (Upper):   Patient denies nausea, vomiting, and indigestion/ heartburn.  Gastrointestinal (Lower):   Patient denies diarrhea and constipation.  Constitutional:   Patient denies fever, night sweats, weight loss, and fatigue.  Skin:   Patient denies skin rash/ lesion and itching.  Eyes:   Patient denies blurred vision and double vision.  Ears/ Nose/ Throat:   Patient denies sore throat and sinus problems.  Hematologic/Lymphatic:   Patient denies swollen glands and easy bruising.  Cardiovascular:   Patient denies leg swelling and chest pains.  Respiratory:   Patient denies cough and shortness of  breath.  Endocrine:   Patient denies excessive thirst.  Musculoskeletal:   Patient denies back pain and joint pain.  Neurological:   Patient denies dizziness and headaches.  Psychologic:   Patient denies depression and anxiety.   Notes: discuss next surgery     VITAL SIGNS:      11/07/2019 07:50 AM  BP 153/66 mmHg  Heart Rate 90 /min  Temperature 98.6 F / 37 C   Complexity of Data:  Records Review:   Previous Patient Records  Urine Test Review:   Urinalysis  X-Ray Review: [facility].T. Stone Protocol: Reviewed Films. Discussed With Patient.     08/21/19 02/27/19 10/15/18 06/22/18 12/21/17 09/15/17 06/12/17 12/05/16  PSA  Total PSA 6.08 ng/mL 4.89 ng/mL 5.32 ng/mL 4.14 ng/mL 3.63 ng/mL 3.82 ng/mL 3.78 ng/mL 3.38 ng/dl  Free PSA    0.32 ng/mL      % Free PSA    8 % PSA        08/23/06 09/09/04  Hormones  Testosterone, Total 3.65  4.19     PROCEDURES:          Urinalysis w/Scope Dipstick Dipstick Cont'd Micro  Color: Amber Bilirubin: Neg mg/dL WBC/hpf: 6 - 10/hpf  Appearance: Cloudy Ketones: Neg mg/dL RBC/hpf: >60/hpf  Specific Gravity: 1.020 Blood: 3+ ery/uL Bacteria: Few (10-25/hpf)  pH: 5.5 Protein: 2+ mg/dL Cystals: NS (Not Seen)  Glucose: Neg mg/dL Urobilinogen: 0.2 mg/dL Casts: NS (Not Seen)    Nitrites: Neg Trichomonas: Not Present    Leukocyte Esterase: 1+ leu/uL Mucous: Present      Epithelial Cells: 0 - 5/hpf      Yeast: NS (Not Seen)      Sperm: Not Present    ASSESSMENT:      ICD-10 Details  1 GU:   Renal calculus - N20.0 Chronic, Stable - He has multiple residual RLP stones that I couldn't adequately fragment with URS. I have discussed the options including stent removal and monitoring, ESWL, repeat URS and PCNL. I explained that PCNL would provide the highest probability of becoming stone free. He is most interested in that approach and would like to begin the scheduling process but would also like to consider a second opinion. I will get him set up to see Dr.  Kandee Keen at [facility] but will go ahead and work on scheduling here as to not delay the process. I reviewed the risks of bleeding, infection, injury to the kidney and adjacent structures, renal loss, need for secondary procedures,  urine leaks, thrombotic events and anesthetic complications.      PLAN:           Orders Labs Urine Culture          Schedule Return Visit/Planned Activity: ASAP - Consult             Note: Dierdre Harness MD with [facility].   Return Visit/Planned Activity: Next Available Appointment - Schedule Surgery  Procedure: Approximately 3 Weeks - PCNL - 50080, right          Document Letter(s):  Created for Patient: Clinical Summary         Notes:   CC: Dr. Scarlette Calico.         Next Appointment:      Next Appointment: 11/26/2019 07:30 AM    Appointment Type: Surgery     Location: [facility] [phone]    Provider: Irine Seal, M.D.    Reason for Visit: OBS [facility] RT PCNL

## 2019-11-26 NOTE — Progress Notes (Addendum)
Inpatient Diabetes Program Recommendations  AACE/ADA: New Consensus Statement on Inpatient Glycemic Control (2015)  Target Ranges:  Prepandial:   less than 140 mg/dL      Peak postprandial:   less than 180 mg/dL (1-2 hours)      Critically ill patients:  140 - 180 mg/dL   Lab Results  Component Value Date   GLUCAP 362 (H) 11/26/2019   HGBA1C 7.9 (H) 11/20/2019    Review of Glycemic Control Results for Anthony Pain JR. "TOMMY" (MRN [id number]) as of 11/26/2019 13:43  Ref. Range 11/26/2019 05:51 11/26/2019 08:50 11/26/2019 10:49 11/26/2019 13:26  Glucose-Capillary Latest Ref Range: 70 - 99 mg/dL 119 (H) 134 (H) 213 (H) 362 (H)   Diabetes history: DM 1-LADA Outpatient Diabetes medications:  Insulin pump-See's Dr. Buddy Duty Current orders for Inpatient glycemic control:  Insulin pump  Inpatient Diabetes Program Recommendations:    Of note patient received Decadron 5 mg during surgery for PONV.  This has likely increased blood sugars. Per RN, patient has administered Novolog 12 units x2 while in PACU due to elevated blood sugars.  Advised RN to tell patient not to take additional insulin at this time.  Recheck blood sugar in 2 hours.  Will see patient as soon as he transfers to floor and get pump settings.   Thanks,  Adah Perl, RN, BC-ADM Inpatient Diabetes Coordinator Pager [phone] (8a-5p)  Addendum: 1640-Patient now in room 1408.  He has on Tandem insulin pump with Dexcom sensor.  His insulin pump settings are:  12a-6a- 1 unit/hr- 1 unit/5 grams CHO, 1 unit drops blood sugar 18 mg/dL, Goal=100 mg/dL 6a-MN- 2 units/hr- 1 unit/5 grams CHO, 1 unit drops blood sugar 18 mg/dL, Goal=100 mg/dL  Patient has insulin pump supplies in car. Wife will bring them in.  He states that last A1C was 7%.  Told him to let RN know how much he boluses with each meal so that she can document.  Patient verbalized understanding.

## 2019-11-27 ENCOUNTER — Observation Stay (HOSPITAL_COMMUNITY): Payer: Medicare Other

## 2019-11-27 DIAGNOSIS — Z906 Acquired absence of other parts of urinary tract: Secondary | ICD-10-CM | POA: Diagnosis not present

## 2019-11-27 DIAGNOSIS — Z87442 Personal history of urinary calculi: Secondary | ICD-10-CM | POA: Diagnosis not present

## 2019-11-27 DIAGNOSIS — Z9641 Presence of insulin pump (external) (internal): Secondary | ICD-10-CM | POA: Diagnosis present

## 2019-11-27 DIAGNOSIS — I10 Essential (primary) hypertension: Secondary | ICD-10-CM | POA: Diagnosis not present

## 2019-11-27 DIAGNOSIS — F17210 Nicotine dependence, cigarettes, uncomplicated: Secondary | ICD-10-CM | POA: Diagnosis present

## 2019-11-27 DIAGNOSIS — Z7982 Long term (current) use of aspirin: Secondary | ICD-10-CM | POA: Diagnosis not present

## 2019-11-27 DIAGNOSIS — E669 Obesity, unspecified: Secondary | ICD-10-CM | POA: Diagnosis not present

## 2019-11-27 DIAGNOSIS — Z8551 Personal history of malignant neoplasm of bladder: Secondary | ICD-10-CM | POA: Diagnosis not present

## 2019-11-27 DIAGNOSIS — N401 Enlarged prostate with lower urinary tract symptoms: Secondary | ICD-10-CM | POA: Diagnosis not present

## 2019-11-27 DIAGNOSIS — Z794 Long term (current) use of insulin: Secondary | ICD-10-CM | POA: Diagnosis not present

## 2019-11-27 DIAGNOSIS — Z9089 Acquired absence of other organs: Secondary | ICD-10-CM | POA: Diagnosis not present

## 2019-11-27 DIAGNOSIS — E1022 Type 1 diabetes mellitus with diabetic chronic kidney disease: Secondary | ICD-10-CM | POA: Diagnosis not present

## 2019-11-27 DIAGNOSIS — C61 Malignant neoplasm of prostate: Secondary | ICD-10-CM | POA: Diagnosis not present

## 2019-11-27 DIAGNOSIS — Z8546 Personal history of malignant neoplasm of prostate: Secondary | ICD-10-CM | POA: Diagnosis not present

## 2019-11-27 DIAGNOSIS — N181 Chronic kidney disease, stage 1: Secondary | ICD-10-CM | POA: Diagnosis not present

## 2019-11-27 DIAGNOSIS — G4730 Sleep apnea, unspecified: Secondary | ICD-10-CM | POA: Diagnosis not present

## 2019-11-27 DIAGNOSIS — M4802 Spinal stenosis, cervical region: Secondary | ICD-10-CM | POA: Diagnosis present

## 2019-11-27 DIAGNOSIS — I129 Hypertensive chronic kidney disease with stage 1 through stage 4 chronic kidney disease, or unspecified chronic kidney disease: Secondary | ICD-10-CM | POA: Diagnosis not present

## 2019-11-27 DIAGNOSIS — Z6833 Body mass index (BMI) 33.0-33.9, adult: Secondary | ICD-10-CM | POA: Diagnosis not present

## 2019-11-27 DIAGNOSIS — N200 Calculus of kidney: Secondary | ICD-10-CM | POA: Diagnosis not present

## 2019-11-27 DIAGNOSIS — Z8249 Family history of ischemic heart disease and other diseases of the circulatory system: Secondary | ICD-10-CM | POA: Diagnosis not present

## 2019-11-27 DIAGNOSIS — Z79899 Other long term (current) drug therapy: Secondary | ICD-10-CM | POA: Diagnosis not present

## 2019-11-27 DIAGNOSIS — N320 Bladder-neck obstruction: Secondary | ICD-10-CM | POA: Diagnosis not present

## 2019-11-27 DIAGNOSIS — E785 Hyperlipidemia, unspecified: Secondary | ICD-10-CM | POA: Diagnosis not present

## 2019-11-27 DIAGNOSIS — Z9049 Acquired absence of other specified parts of digestive tract: Secondary | ICD-10-CM | POA: Diagnosis not present

## 2019-11-27 DIAGNOSIS — N138 Other obstructive and reflux uropathy: Secondary | ICD-10-CM | POA: Diagnosis not present

## 2019-11-27 LAB — GLUCOSE, CAPILLARY
Glucose-Capillary: 233 mg/dL — ABNORMAL HIGH (ref 70–99)
Glucose-Capillary: 89 mg/dL (ref 70–99)
Glucose-Capillary: 253 mg/dL — ABNORMAL HIGH (ref 70–99)
Glucose-Capillary: 193 mg/dL — ABNORMAL HIGH (ref 70–99)
Glucose-Capillary: 101 mg/dL — ABNORMAL HIGH (ref 70–99)

## 2019-11-27 LAB — HEMOGLOBIN AND HEMATOCRIT, BLOOD
HCT: 43.3 % (ref 39.0–52.0)
Hemoglobin: 14.4 g/dL (ref 13.0–17.0)

## 2019-11-27 MED ORDER — ACETAMINOPHEN 10 MG/ML IV SOLN
1000.0000 mg | Freq: Four times a day (QID) | INTRAVENOUS | Status: AC
  Administered 2019-11-27 – 2019-11-28 (×4): 1000 mg via INTRAVENOUS
  Filled 2019-11-27 (×5): qty 100

## 2019-11-27 NOTE — Progress Notes (Signed)
The CT showed a sizable residual fragment that appears to be in the mid kidney and will need a second look.  He is having nausea today.  I will put him on the IV tylenol for pain control to see if that will help with the symptoms.

## 2019-11-28 ENCOUNTER — Inpatient Hospital Stay (HOSPITAL_COMMUNITY): Payer: Medicare Other

## 2019-11-28 ENCOUNTER — Encounter (HOSPITAL_COMMUNITY): Payer: Self-pay | Admitting: Urology

## 2019-11-28 ENCOUNTER — Encounter (HOSPITAL_COMMUNITY)
Admission: RE | Disposition: A | Discharge status: Home / Self Care | Payer: Self-pay | Source: Ambulatory Visit | Attending: Urology

## 2019-11-28 ENCOUNTER — Inpatient Hospital Stay (HOSPITAL_COMMUNITY): Payer: Medicare Other | Admitting: Anesthesiology

## 2019-11-28 HISTORY — PX: NEPHROLITHOTOMY: SHX5134

## 2019-11-28 LAB — GLUCOSE, CAPILLARY
Glucose-Capillary: 84 mg/dL (ref 70–99)
Glucose-Capillary: 290 mg/dL — ABNORMAL HIGH (ref 70–99)
Glucose-Capillary: 135 mg/dL — ABNORMAL HIGH (ref 70–99)
Glucose-Capillary: 135 mg/dL — ABNORMAL HIGH (ref 70–99)
Glucose-Capillary: 204 mg/dL — ABNORMAL HIGH (ref 70–99)

## 2019-11-28 SURGERY — NEPHROLITHOTOMY PERCUTANEOUS SECOND LOOK
Anesthesia: General | Site: Flank | Laterality: Right

## 2019-11-28 MED ORDER — ACETAMINOPHEN 500 MG PO TABS
1000.0000 mg | ORAL_TABLET | Freq: Three times a day (TID) | ORAL | Status: DC
  Administered 2019-11-28 – 2019-11-29 (×3): 1000 mg via ORAL
  Filled 2019-11-28 (×4): qty 2

## 2019-11-28 MED ORDER — LACTATED RINGERS IV SOLN: INTRAVENOUS | Status: DC

## 2019-11-28 MED ORDER — SUGAMMADEX SODIUM 500 MG/5ML IV SOLN
INTRAVENOUS | Status: AC
  Filled 2019-11-28: qty 5

## 2019-11-28 MED ORDER — LACTATED RINGERS IV SOLN: INTRAVENOUS | Status: DC | PRN

## 2019-11-28 MED ORDER — SUGAMMADEX SODIUM 500 MG/5ML IV SOLN
INTRAVENOUS | Status: DC | PRN
Start: 2019-11-28 — End: 2019-11-28
  Administered 2019-11-28: 300 mg via INTRAVENOUS

## 2019-11-28 MED ORDER — DEXTROSE 50 % IV SOLN
INTRAVENOUS | Status: AC
  Filled 2019-11-28: qty 50

## 2019-11-28 MED ORDER — HYDROMORPHONE HCL 1 MG/ML IJ SOLN
0.2500 mg | INTRAMUSCULAR | Status: DC | PRN
  Administered 2019-11-28: 0.5 mg via INTRAVENOUS

## 2019-11-28 MED ORDER — LIDOCAINE 2% (20 MG/ML) 5 ML SYRINGE
INTRAMUSCULAR | Status: DC | PRN
  Administered 2019-11-28: 70 mg via INTRAVENOUS

## 2019-11-28 MED ORDER — DEXAMETHASONE SODIUM PHOSPHATE 10 MG/ML IJ SOLN
INTRAMUSCULAR | Status: AC
  Filled 2019-11-28: qty 1

## 2019-11-28 MED ORDER — FENTANYL CITRATE (PF) 100 MCG/2ML IJ SOLN
INTRAMUSCULAR | Status: DC | PRN
  Administered 2019-11-28: 50 ug via INTRAVENOUS
  Administered 2019-11-28: 100 ug via INTRAVENOUS
  Administered 2019-11-28: 50 ug via INTRAVENOUS

## 2019-11-28 MED ORDER — ESMOLOL HCL 100 MG/10ML IV SOLN
INTRAVENOUS | Status: AC
  Filled 2019-11-28: qty 10

## 2019-11-28 MED ORDER — ONDANSETRON HCL 4 MG/2ML IJ SOLN
INTRAMUSCULAR | Status: DC | PRN
  Administered 2019-11-28: 4 mg via INTRAVENOUS

## 2019-11-28 MED ORDER — FENTANYL CITRATE (PF) 100 MCG/2ML IJ SOLN
INTRAMUSCULAR | Status: AC
  Filled 2019-11-28: qty 2

## 2019-11-28 MED ORDER — PROPOFOL 10 MG/ML IV BOLUS
INTRAVENOUS | Status: DC | PRN
  Administered 2019-11-28: 150 mg via INTRAVENOUS

## 2019-11-28 MED ORDER — MIDAZOLAM HCL 5 MG/5ML IJ SOLN
INTRAMUSCULAR | Status: DC | PRN
  Administered 2019-11-28: 1 mg via INTRAVENOUS

## 2019-11-28 MED ORDER — SODIUM CHLORIDE 0.9 % IR SOLN
Status: DC | PRN
  Administered 2019-11-28: 3000 mL

## 2019-11-28 MED ORDER — MIDAZOLAM HCL 2 MG/2ML IJ SOLN
INTRAMUSCULAR | Status: AC
  Filled 2019-11-28: qty 2

## 2019-11-28 MED ORDER — ONDANSETRON HCL 4 MG/2ML IJ SOLN: 4.0000 mg | Freq: Once | INTRAMUSCULAR | Status: DC | PRN

## 2019-11-28 MED ORDER — HYDROMORPHONE HCL 1 MG/ML IJ SOLN
INTRAMUSCULAR | Status: AC
  Filled 2019-11-28: qty 1

## 2019-11-28 MED ORDER — DEXAMETHASONE SODIUM PHOSPHATE 10 MG/ML IJ SOLN
INTRAMUSCULAR | Status: DC | PRN
  Administered 2019-11-28: 4 mg via INTRAVENOUS

## 2019-11-28 MED ORDER — IOHEXOL 300 MG/ML  SOLN
INTRAMUSCULAR | Status: DC | PRN
  Administered 2019-11-28: 30 mL

## 2019-11-28 MED ORDER — MEPERIDINE HCL 50 MG/ML IJ SOLN: 6.2500 mg | INTRAMUSCULAR | Status: DC | PRN

## 2019-11-28 MED ORDER — PROPOFOL 10 MG/ML IV BOLUS
INTRAVENOUS | Status: AC
  Filled 2019-11-28: qty 20

## 2019-11-28 MED ORDER — SODIUM CHLORIDE 0.9 % IR SOLN
Status: DC | PRN
  Administered 2019-11-28: 3000 mL
  Administered 2019-11-28: 1000 mL

## 2019-11-28 MED ORDER — CHLORHEXIDINE GLUCONATE CLOTH 2 % EX PADS: 6.0000 | MEDICATED_PAD | Freq: Every day | CUTANEOUS | Status: DC

## 2019-11-28 MED ORDER — ROCURONIUM BROMIDE 10 MG/ML (PF) SYRINGE
PREFILLED_SYRINGE | INTRAVENOUS | Status: DC | PRN
  Administered 2019-11-28: 80 mg via INTRAVENOUS

## 2019-11-28 MED ORDER — ONDANSETRON HCL 4 MG/2ML IJ SOLN
INTRAMUSCULAR | Status: AC
  Filled 2019-11-28: qty 2

## 2019-11-28 MED ORDER — LIDOCAINE 2% (20 MG/ML) 5 ML SYRINGE
INTRAMUSCULAR | Status: AC
  Filled 2019-11-28: qty 5

## 2019-11-28 SURGICAL SUPPLY — 47 items
BAG URINE DRAIN 2000ML AR STRL (UROLOGICAL SUPPLIES) ×2 IMPLANT
BASKET LASER NITINOL 1.9FR (BASKET) ×1 IMPLANT
BASKET STONE NCOMPASS (UROLOGICAL SUPPLIES) ×1 IMPLANT
BASKET ZERO TIP NITINOL 2.4FR (BASKET) IMPLANT
BENZOIN TINCTURE PRP APPL 2/3 (GAUZE/BANDAGES/DRESSINGS) ×8 IMPLANT
BLADE SURG 15 STRL LF DISP TIS (BLADE) ×1 IMPLANT
BLADE SURG 15 STRL SS (BLADE) ×2
CATH FOLEY 2W COUNCIL 20FR 5CC (CATHETERS) IMPLANT
CATH ROBINSON RED A/P 20FR (CATHETERS) IMPLANT
CATH X-FORCE N30 NEPHROSTOMY (TUBING) ×2 IMPLANT
COVER WAND RF STERILE (DRAPES) IMPLANT
DRAPE C-ARM 42X120 X-RAY (DRAPES) ×2 IMPLANT
DRAPE LINGEMAN PERC (DRAPES) ×2 IMPLANT
DRAPE SURG IRRIG POUCH 19X23 (DRAPES) ×2 IMPLANT
DRSG PAD ABDOMINAL 8X10 ST (GAUZE/BANDAGES/DRESSINGS) ×4 IMPLANT
DRSG TEGADERM 2-3/8X2-3/4 SM (GAUZE/BANDAGES/DRESSINGS) ×1 IMPLANT
DRSG TEGADERM 4X4.75 (GAUZE/BANDAGES/DRESSINGS) ×1 IMPLANT
DRSG TEGADERM 8X12 (GAUZE/BANDAGES/DRESSINGS) ×3 IMPLANT
FIBER LASER FLEXIVA 1000 (UROLOGICAL SUPPLIES) IMPLANT
FIBER LASER FLEXIVA 550 (UROLOGICAL SUPPLIES) IMPLANT
GAUZE SPONGE 4X4 12PLY STRL (GAUZE/BANDAGES/DRESSINGS) ×2 IMPLANT
GLOVE BIOGEL M STRL SZ7.5 (GLOVE) ×2 IMPLANT
GOWN STRL REUS W/TWL LRG LVL3 (GOWN DISPOSABLE) ×4 IMPLANT
GUIDEWIRE ANG ZIPWIRE 038X150 (WIRE) ×2 IMPLANT
GUIDEWIRE STR DUAL SENSOR (WIRE) ×2 IMPLANT
KIT BASIN (CUSTOM PROCEDURE TRAY) ×2 IMPLANT
KIT PROBE 340X3.4XDISP GRN (MISCELLANEOUS) IMPLANT
KIT PROBE TRILOGY 3.4X340 (MISCELLANEOUS)
KIT PROBE TRILOGY 3.9X350 (MISCELLANEOUS) IMPLANT
KIT TURNOVER KIT A (KITS) IMPLANT
MANIFOLD NEPTUNE II (INSTRUMENTS) ×2 IMPLANT
NS IRRIG 1000ML POUR BTL (IV SOLUTION) ×2 IMPLANT
PACK BASIC VI WITH GOWN DISP (CUSTOM PROCEDURE TRAY) ×2 IMPLANT
PENCIL SMOKE EVACUATOR (MISCELLANEOUS) IMPLANT
SPONGE LAP 4X18 RFD (DISPOSABLE) ×2 IMPLANT
SURGIFLO W/THROMBIN 8M KIT (HEMOSTASIS) IMPLANT
SUT SILK 2 0 30  PSL (SUTURE)
SUT SILK 2 0 30 PSL (SUTURE) IMPLANT
SUT VIC AB 0 CT2 27 (SUTURE) IMPLANT
SUT VIC AB 2-0 CT1 27 (SUTURE) ×2
SUT VIC AB 2-0 CT1 TAPERPNT 27 (SUTURE) IMPLANT
SYR 10ML LL (SYRINGE) ×2 IMPLANT
SYR 20ML LL LF (SYRINGE) ×4 IMPLANT
TOWEL OR NON WOVEN STRL DISP B (DISPOSABLE) ×2 IMPLANT
TRAY FOLEY MTR SLVR 16FR STAT (SET/KITS/TRAYS/PACK) ×1 IMPLANT
TUBING CONNECTING 10 (TUBING) ×6 IMPLANT
TUBING UROLOGY SET (TUBING) ×2 IMPLANT

## 2019-11-28 NOTE — Progress Notes (Signed)
2 Days Post-Op   Subjective/Chief Complaint:  1 - Large RIGHT Renal Stones - s/p RIGHT 1st stage perutaneous nephrostolithotomy with IR access 11/26/19 b Dr Jeffie Pollock. CT 5/12 shows vast majority of stone removed, small volme mid and lower remain.  Today "Anthony Daniel" is stable. Residual stone discsussed and he otps for 2nd stage procedure today. No interval fevers. Last Hgb 14, UCX negative, remains on prophs ancef.   Objective: Vital signs in last 24 hours: Temp:  [98.3 F (36.8 C)] 98.3 F (36.8 C) (05/13 0612) Pulse Rate:  [79-106] 106 (05/13 0751) Resp:  [20] 20 (05/13 0612) BP: (119-135)/(60-69) 121/69 (05/13 0751) SpO2:  [92 %-95 %] 95 % (05/13 0751) Last BM Date: 11/26/19  Intake/Output from previous day: 05/12 0701 - 05/13 0700 In: 3869.8 [P.O.:360; I.V.:2978.2; IV Piggyback:531.6] Out: 1830 [Urine:1830] Intake/Output this shift: No intake/output data recorded.  General appearance: alert, cooperative and wife at bedside Eyes: negative Nose: Nares normal. Septum midline. Mucosa normal. No drainage or sinus tenderness. Throat: lips, mucosa, and tongue normal; teeth and gums normal Neck: supple, symmetrical, trachea midline Back: symmetric, no curvature. ROM normal. No CVA tenderness., Rt nephrostomy (foley silicone) with nephroureteral stent in expected position with non-foul urine. No large echcymoses.  Resp: non-labored on room air.  Cardio: Nl rate GI: soft, non-tender; bowel sounds normal; no masses,  no organomegaly Male genitalia: normal Extremities: extremities normal, atraumatic, no cyanosis or edema Skin: Skin color, texture, turgor normal. No rashes or lesions Lymph nodes: Cervical, supraclavicular, and axillary nodes normal. Neurologic: Grossly normal  Lab Results:  Recent Labs    11/27/19 0500  HGB 14.4  HCT 43.3   BMET No results for input(s): NA, K, CL, CO2, GLUCOSE, BUN, CREATININE, CALCIUM in the last 72 hours. PT/INR No results for input(s): LABPROT,  INR in the last 72 hours. ABG No results for input(s): PHART, HCO3 in the last 72 hours.  Invalid input(s): PCO2, PO2  Studies/Results: CT ABDOMEN PELVIS WO CONTRAST  Result Date: 11/27/2019 CLINICAL DATA:  Inpatient. Follow-up urolithiasis status post right ureteral stent placement and percutaneous nephrolithotomy. EXAM: CT ABDOMEN AND PELVIS WITHOUT CONTRAST TECHNIQUE: Multidetector CT imaging of the abdomen and pelvis was performed following the standard protocol without IV contrast. COMPARISON:  10/31/2019 CT abdomen/pelvis. FINDINGS: Lower chest: Hypoventilatory changes at the dependent lung bases, right greater than left. Trace dependent right pleural effusion. Coronary atherosclerosis. Hepatobiliary: Normal liver size. No liver mass. Normal gallbladder with no radiopaque cholelithiasis. No biliary ductal dilatation. Pancreas: Normal, with no mass or duct dilation. Spleen: Normal size. No mass. Adrenals/Urinary Tract: Normal adrenals. There are 2 well-positioned percutaneous right nephroureteral stents, both terminating in the bladder lumen. Expected small amount of right perinephric gas and fat stranding without perinephric fluid collection. Expected gas within the right renal collecting system. Mild right sided caliectasis. No left hydronephrosis. Irregular 12 x 10 mm interpolar right renal stone fragment. Tiny lower right renal stone fragments measuring 4 mm and 2 mm. Several nonobstructing left renal stones, unchanged, largest 6 mm in the lower left kidney. Simple 1.3 cm upper left renal cyst. No additional contour deforming renal lesions. No stone fragments in right ureter. Ureters are normal caliber. No left ureteral stones. Collapsed bladder. Tiny amount of expected gas in the nondependent bladder lumen. No bladder stones. Stomach/Bowel: Normal non-distended stomach. Normal caliber small bowel with no small bowel wall thickening. Appendectomy. Normal large bowel with no diverticulosis, large  bowel wall thickening or pericolonic fat stranding. Vascular/Lymphatic: Atherosclerotic nonaneurysmal abdominal aorta. No pathologically  enlarged lymph nodes in the abdomen or pelvis. Reproductive: Top-normal size prostate with coarse internal prostatic calcification, nonspecific. Other: No pneumoperitoneum, ascites or focal fluid collection. Musculoskeletal: No aggressive appearing focal osseous lesions. IMPRESSION: 1. Well-positioned percutaneous right nephroureteral stents x 2. Mild diffuse right caliectasis. Three residual stone fragments in the right renal collecting system as detailed. No stone fragments in the right ureter or bladder. 2. Nonobstructing left nephrolithiasis, stable. 3. Hypoventilatory changes at the dependent lung bases, right greater than left. Trace dependent right pleural effusion. 4. Aortic Atherosclerosis (ICD10-I70.0). Electronically Signed   By: Ilona Sorrel M.D.   On: 11/27/2019 12:59   DG C-Arm 1-60 Min-No Report  Result Date: 11/26/2019 Fluoroscopy was utilized by the requesting physician.  No radiographic interpretation.    Anti-infectives: Anti-infectives (From admission, onward)   Start     Dose/Rate Route Frequency Ordered Stop   11/26/19 1700  ceFAZolin (ANCEF) IVPB 1 g/50 mL premix     1 g 100 mL/hr over 30 Minutes Intravenous Every 8 hours 11/26/19 1229     11/26/19 1338  ceFAZolin (ANCEF) 1-4 GM/50ML-% IVPB    Note to Pharmacy: Ward, Christa   : cabinet override      11/26/19 1338 11/27/19 0144   11/26/19 0543  ceFAZolin (ANCEF) 2-4 GM/100ML-% IVPB    Note to Pharmacy: Randa Evens  : cabinet override      11/26/19 0543 11/26/19 0930   11/26/19 0538  ceFAZolin (ANCEF) IVPB 2g/100 mL premix     2 g 200 mL/hr over 30 Minutes Intravenous 30 min pre-op 11/26/19 0538 11/26/19 0918      Assessment/Plan:  Proceed as planned with 2nd stage RIGHT percutaneous nephrostolithotomy. Risks, benefits, alternatives, expected peri-op course discused with pt  and wife who are in agreement.   Alexis Frock 11/28/2019

## 2019-11-28 NOTE — Anesthesia Procedure Notes (Signed)
Procedure Name: Intubation Date/Time: 11/28/2019 1:30 PM Performed by: Lavina Hamman, CRNA Pre-anesthesia Checklist: Patient identified, Emergency Drugs available, Suction available, Patient being monitored and Timeout performed Patient Re-evaluated:Patient Re-evaluated prior to induction Oxygen Delivery Method: Circle system utilized Preoxygenation: Pre-oxygenation with 100% oxygen Induction Type: IV induction Ventilation: Mask ventilation without difficulty and Oral airway inserted - appropriate to patient size Laryngoscope Size: Mac and 4 Grade View: Grade II Tube type: Oral Tube size: 7.5 mm Number of attempts: 1 Airway Equipment and Method: Stylet Placement Confirmation: ETT inserted through vocal cords under direct vision,  positive ETCO2,  CO2 detector and breath sounds checked- equal and bilateral Secured at: 23 cm Tube secured with: Tape Dental Injury: Teeth and Oropharynx as per pre-operative assessment  Comments: ATOI

## 2019-11-28 NOTE — Op Note (Signed)
NAME: Anthony JR., Rice [id number] ACCOUNT [id number] DATE OF BIRTH:[birth year] FACILITY: [facility] LOCATION: [facility] PHYSICIAN:Detrich Rakestraw Tresa Moore, MD  OPERATIVE REPORT  DATE OF PROCEDURE:  11/28/2019  SURGEON:  Alexis Frock, MD  PREOPERATIVE DIAGNOSIS:  Residual right renal stone, status post right first-stage percutaneous nephrostolithotomy.  POSTOPERATIVE DIAGNOSIS:  Residual right renal stone, status post right first-stage percutaneous nephrostolithotomy.  PROCEDURE: 1.  Right second-stage percutaneous nephrostolithotomy stone less than 2 cm. 2.  Right diagnostic ureteroscopy. 3.  Exchange of right ureteral stent. 4.  Right antegrade nephrostogram interpretation.  ESTIMATED BLOOD LOSS:  Nil.  COMPLICATIONS:  None.  SPECIMENS:  Right renal stone fragments given to patient.  DRAINS:  Foley catheter to straight drain.  FINDINGS: 1.  Relatively small volume right mid and lower pole renal stone as anticipated. 2.  Complete resolution of all accessible stone fragments larger than one-third mm following a second-stage procedure. 3.  Successful placement of a right ureteral stent, proximal end in renal pelvis, distal end in urinary bladder.  INDICATIONS:  The patient is a pleasant 69 year old man with history of recurrent urolithiasis.  He underwent first-stage right percutaneous nephrostolithotomy several days ago by my colleagues that was very successful in removing the vast majority of  his right renal stone.  As the patient's goal is completely stone free, postoperative axial imaging was obtained, which did reveal some small volume residual stone in the location likely acutely angled to the prior sheath access.  Options were discussed  for continued management, including medical therapy alone versus a second-stage procedure and the patient wished to proceed.  Informed consent was obtained and placed in medical record.  DESCRIPTION OF PROCEDURE:  The patient being  identified, the procedure being right second-stage percutaneous nephrostolithotomy was confirmed.  Procedure timeout was performed.  Intravenous antibiotics were administered.  General endotracheal anesthesia  induced.  The patient was placed into a prone position after a new latex-free Foley catheter was placed free to straight drain.  His in situ right lower pole nephroureteral stent and upper pole nephroureteral stent and nephrostomy tube were prepped and  draped with his right flank using chlorhexidine gluconate and a percutaneous drape was applied.  Initial spot fluoroscopic images and right antegrade nephrostogram revealed expected course of the right lower pole nephroureteral stent and upper pole nephrostomy without any extravasation.  The upper pole nephrostomy was removed after deflating the  balloon.  The sensor wire was placed via the upper pole nephroureteral stent acting as a safety wire and a flexible nephroscopy was performed using a 16-French flexible cystoscope.  Inspection of the kidney revealed small volume residual stone, estimated  approximately 1 cm with 2 dominant foci, 1 midpole, another lower pole.  The midpole stone was amenable to basketing with an escape basket x2.  Lower pole stones were removed using a combination of Escape basket and NCompass basket.  Via the upper  access and flexible nephroscopy,  I was able to visualize the lower access and traced the course of it as well.  Therefore, I determined no need to dilate this tract.  As the goal today was to clearly render her stone free, the lower pole left ureteral  stent was completely removed.  A second Glidewire was placed through the upper tract to the level of the urinary bladder, over which a flexible digital ureteroscope was placed in antegrade fashion to the level of the urinary bladder and diagnostic  ureteroscopy was performed the entire length right ureter, which revealed no additional calcifications, no  evidence of  ureteral injury.  This was overall quite favorable.  We achieved the goals of surgery today.  Given the uncomplicated course, it was  felt that closure of the percutaneous tract and placement of just a ureteral stent would be most advantageous.  As such, a new 6 x 26 Contour Polaris-type stent was placed in antegrade fashion over the remaining safety wire using nephroscopic and  fluoroscopic guidance.  Good proximal and distal planes were noted.  The percutaneous tract was closed with 10 mL of FloSeal and then at the level of the skin using interrupted Vicryl x4, followed by dry dressing.  The procedure was terminated.  The  patient tolerated the procedure well.  No immediate perioperative complications.  The patient was taken to postanesthesia care unit in stable condition with plan for hospital stay tonight, likely discharge tomorrow.  VN/NUANCE  D:11/28/2019 T:11/28/2019 JOB:[id number]

## 2019-11-28 NOTE — Anesthesia Preprocedure Evaluation (Addendum)
Anesthesia Evaluation  Patient identified by MRN, date of birth, ID band Patient awake    Reviewed: Allergy & Precautions, NPO status , Patient's Chart, lab work & pertinent test results  History of Anesthesia Complications (+) PONV  Airway Mallampati: II  TM Distance: >3 FB Neck ROM: Full    Dental no notable dental hx. (+) Dental Advisory Given, Teeth Intact   Pulmonary Current Smoker and Patient abstained from smoking.,    Pulmonary exam normal breath sounds clear to auscultation       Cardiovascular hypertension, Pt. on medications Normal cardiovascular exam Rhythm:Regular Rate:Normal     Neuro/Psych negative neurological ROS  negative psych ROS   GI/Hepatic   Endo/Other  diabetes, Type 1, Insulin DependentPt w insulin Pump BS 151 Will cut to 1/2 basal rate for surgery   Renal/GU      Musculoskeletal   Abdominal (+) + obese,   Peds  Hematology   Anesthesia Other Findings   Reproductive/Obstetrics                            Anesthesia Physical Anesthesia Plan  ASA: III  Anesthesia Plan: General   Post-op Pain Management:    Induction: Intravenous  PONV Risk Score and Plan: 2 and Ondansetron, Treatment may vary due to age or medical condition and Midazolam  Airway Management Planned: Oral ETT  Additional Equipment: None  Intra-op Plan:   Post-operative Plan: Extubation in OR  Informed Consent: I have reviewed the patients History and Physical, chart, labs and discussed the procedure including the risks, benefits and alternatives for the proposed anesthesia with the patient or authorized representative who has indicated his/her understanding and acceptance.     Dental advisory given  Plan Discussed with: CRNA and Surgeon  Anesthesia Plan Comments:        Anesthesia Quick Evaluation

## 2019-11-28 NOTE — Transfer of Care (Signed)
Immediate Anesthesia Transfer of Care Note  Patient: Anthony Daniel.  Procedure(s) Performed: NEPHROLITHOTOMY PERCUTANEOUS SECOND LOOK (Right Flank)  Patient Location: PACU  Anesthesia Type:General  Level of Consciousness: drowsy, patient cooperative and responds to stimulation  Airway & Oxygen Therapy: Patient Spontanous Breathing and Patient connected to face mask oxygen  Post-op Assessment: Report given to RN and Post -op Vital signs reviewed and stable  Post vital signs: Reviewed and stable  Last Vitals:  Vitals Value Taken Time  BP    Temp    Pulse    Resp    SpO2      Last Pain:  Vitals:   11/28/19 1229  TempSrc:   PainSc: 5       Patients Stated Pain Goal: 4 ([id number])  Complications: No apparent anesthesia complications

## 2019-11-28 NOTE — Brief Op Note (Signed)
11/28/2019  2:52 PM  PATIENT:  Anthony Daniel.  69 y.o. male  PRE-OPERATIVE DIAGNOSIS:  RIGHT RENAL STONE  POST-OPERATIVE DIAGNOSIS:  RIGHT RENAL STONE  PROCEDURE:  Procedure(s): NEPHROLITHOTOMY PERCUTANEOUS SECOND LOOK (Right) Diagnostic Ureteroscopy, Right Ureteral STent Exchange, RIght  SURGEON:  Surgeon(s) and Role:    * Alexis Frock, MD - Primary  PHYSICIAN ASSISTANT:   ASSISTANTS: none   ANESTHESIA:   general  EBL:  minimal   BLOOD ADMINISTERED:none  DRAINS: foley to gravity   LOCAL MEDICATIONS USED:  NONE  SPECIMEN:  Source of Specimen:  Rt renal stone fragments  DISPOSITION OF SPECIMEN:  given to patient  COUNTS:  YES  TOURNIQUET:  * No tourniquets in log *  DICTATION: .Other Dictation: Dictation Number [id number]  PLAN OF CARE: Admit to inpatient   PATIENT DISPOSITION:  PACU - hemodynamically stable.   Delay start of Pharmacological VTE agent (>24hrs) due to surgical blood loss or risk of bleeding: yes

## 2019-11-28 NOTE — Progress Notes (Signed)
PACU NOTE:  Pt disoriented upon arrival to PACU. Pt took off simple mask and refused to wear it or a nasal cannula. Pt consistently tried to get out of the bed by sitting up and grabbing the handrails. Pt refused to keep blankets covering his legs and attempted to remove his gown. Fentanyl and Dilauded given, patient denies pain. Dr. Eligha Bridegroom re-evaluated patient and stated not to given any further pain medications.

## 2019-11-29 ENCOUNTER — Encounter: Payer: Self-pay | Admitting: *Deleted

## 2019-11-29 LAB — BASIC METABOLIC PANEL
Anion gap: 10 (ref 5–15)
BUN: 13 mg/dL (ref 8–23)
CO2: 26 mmol/L (ref 22–32)
Calcium: 8.3 mg/dL — ABNORMAL LOW (ref 8.9–10.3)
Chloride: 98 mmol/L (ref 98–111)
Creatinine, Ser: 1.05 mg/dL (ref 0.61–1.24)
GFR calc Af Amer: >60 mL/min (ref >60)
GFR calc non Af Amer: >60 mL/min (ref >60)
Glucose, Bld: 151 mg/dL — ABNORMAL HIGH (ref 70–99)
Potassium: 4.4 mmol/L (ref 3.5–5.1)
Sodium: 134 mmol/L — ABNORMAL LOW (ref 135–145)

## 2019-11-29 LAB — HEMOGLOBIN AND HEMATOCRIT, BLOOD
HCT: 40.7 % (ref 39.0–52.0)
Hemoglobin: 13.5 g/dL (ref 13.0–17.0)

## 2019-11-29 LAB — GLUCOSE, CAPILLARY
Glucose-Capillary: 78 mg/dL (ref 70–99)
Glucose-Capillary: 213 mg/dL — ABNORMAL HIGH (ref 70–99)

## 2019-11-29 MED ORDER — HYDROCODONE-ACETAMINOPHEN 5-325 MG PO TABS: 1.0000 | ORAL_TABLET | Freq: Four times a day (QID) | ORAL | 0 refills | Status: DC | PRN

## 2019-11-29 MED ORDER — SENNOSIDES-DOCUSATE SODIUM 8.6-50 MG PO TABS: 1.0000 | ORAL_TABLET | Freq: Two times a day (BID) | ORAL | 0 refills | Status: DC

## 2019-11-29 NOTE — Anesthesia Postprocedure Evaluation (Signed)
Anesthesia Post Note  Patient: Anthony Daniel.  Procedure(s) Performed: NEPHROLITHOTOMY PERCUTANEOUS SECOND LOOK (Right Flank)     Patient location during evaluation: PACU Anesthesia Type: General Level of consciousness: awake and alert Pain management: pain level controlled Vital Signs Assessment: post-procedure vital signs reviewed and stable Respiratory status: spontaneous breathing, nonlabored ventilation, respiratory function stable and patient connected to nasal cannula oxygen Cardiovascular status: blood pressure returned to baseline and stable Postop Assessment: no apparent nausea or vomiting Anesthetic complications: no    Last Vitals:  Vitals:   11/28/19 2100 11/29/19 0543  BP: (!) 153/70 (!) 147/36  Pulse: 85 93  Resp:    Temp: 37.3 C 36.8 C  SpO2: 96% 97%    Last Pain:  Vitals:   11/29/19 0800  TempSrc:   PainSc: 0-No pain                 Barnet Glasgow

## 2019-11-29 NOTE — Discharge Summary (Signed)
Physician Discharge Summary  [name]. MRN: [id number] DOB/AGE: 18-Aug-1950 69 y.o.  Admit date: 11/26/2019 Discharge date: 11/29/2019  Admission Diagnoses: Large RIGHT Renal Stones  Discharge Diagnoses:  Active Problems:   Right kidney stone   Discharged Condition: good  Hospital Course:    1 - Large RIGHT Renal Stones - s/p RIGHT 1st stage perutaneous nephrostolithotomy with IR access 11/26/19, the day of admission by Dr Jeffie Pollock. CT 5/12 shows vast majority of stone removed, small volme mid and lower remain. Underwent RIGHT 2nd stage surgery 5/13 by Dr. Tresa Moore with right ureteral stent placement. BY the AM aof 5/14, the day of discharge, he is ambulaory, pain controlled on PO meds, maintaining PO nutrition, and felt to be adequate for discharge.   Consults: None  Significant Diagnostic Studies: labs: Cr 1.05, Hgb 13.5 at discharge  Treatments: surgery: as per above  Discharge Exam: Blood pressure (!) 147/36, pulse 93, temperature 98.3 F (36.8 C), temperature source Oral, resp. rate 18, height 6\' 1"  (1.854 m), weight 115.7 kg, SpO2 97 %.   General appearance: alert, cooperative. IN good mood today.  Eyes: negative Nose: Nares normal. Septum midline. Mucosa normal. No drainage or sinus tenderness. Throat: lips, mucosa, and tongue normal; teeth and gums normal Neck: supple, symmetrical, trachea midline Back: symmetric, no curvature. ROM normal. No CVA tenderness., Rt flank dry dressing in place w/o ecchymoses.  Resp: non-labored on room air.  Cardio: Nl rate GI: soft, non-tender; bowel sounds normal; no masses,  no organomegaly Male genitalia: normal. FOely in place with non-foul urine, removed.  Extremities: extremities normal, atraumatic, no cyanosis or edema Skin: Skin color, texture, turgor normal. No rashes or lesions Lymph nodes: Cervical, supraclavicular, and axillary nodes normal. Neurologic: Grossly normal Disposition: Discharge disposition: 01-Home  or Self Care        Allergies as of 11/29/2019      Reactions   Altace [ramipril] Cough   Crestor [rosuvastatin] Other (See Comments)   Muscle aches, cramps   Lipitor [atorvastatin] Other (See Comments)   Muscle aches, cramps   Contrast Media [iodinated Diagnostic Agents] Itching, Nausea And Vomiting   PT TRIED THIS TWICE.EVEN WITH PRE-MEDS AND STILL HAD NAUSEA AND ITCHING--WAS TOLD HE CAN NOT TAKE iv ANYMORE   Iodine    Betadine/iodine rash per patient   Latex Other (See Comments)   Irritation   Oxycodone Nausea Only   Pt can tolerate hydrocodone in small doses only!   Adhesive [tape] Rash   Peanut-containing Drug Products Anxiety, Other (See Comments)   Jittery      Medication List    TAKE these medications   aspirin EC 81 MG tablet Take 81 mg by mouth daily.   Ciclopirox 0.77 % gel APPLY TO AFFECTED AREA TWICE A DAY What changed:   how much to take  how to take this  when to take this  reasons to take this  additional instructions   diphenhydrAMINE 25 MG tablet Commonly known as: BENADRYL Take 50 mg by mouth once. 2 tablets 1 hour before procedure   ezetimibe 10 MG tablet Commonly known as: ZETIA Take 1 tablet (10 mg total) by mouth daily. TAKE 1 TABLET BY MOUTH EVERY DAY   famotidine 20 MG tablet Commonly known as: PEPCID Take 20 mg by mouth once. 1 hour before procedure   fluticasone 50 MCG/ACT nasal spray Commonly known as: Flonase Place 2 sprays into both nostrils daily. What changed:   when to take this  reasons to take  this   HYDROcodone-acetaminophen 5-325 MG tablet Commonly known as: NORCO/VICODIN Take 1 tablet by mouth every 6 (six) hours as needed for moderate pain. What changed: Another medication with the same name was added. Make sure you understand how and when to take each.   HYDROcodone-acetaminophen 5-325 MG tablet Commonly known as: NORCO/VICODIN Take 1-2 tablets by mouth every 6 (six) hours as needed for moderate pain or  severe pain. Post-operatively What changed: You were already taking a medication with the same name, and this prescription was added. Make sure you understand how and when to take each.   indapamide 1.25 MG tablet Commonly known as: LOZOL TAKE 1 TABLET (1.25 MG TOTAL) BY MOUTH DAILY.   insulin pump Soln Inject 1 each into the skin continuous. Novolog insulin   irbesartan 300 MG tablet Commonly known as: AVAPRO Take 1 tablet (300 mg total) by mouth daily.   LANTUS SOLOSTAR Benson Inject 15 Units into the skin 2 (two) times daily as needed. Only uses when pump goes out   levocetirizine 5 MG tablet Commonly known as: XYZAL Take 1 tablet (5 mg total) by mouth daily as needed for allergies.   Livalo 2 MG Tabs Generic drug: Pitavastatin Calcium TAKE 1 TABLET BY MOUTH EVERY MORNING   metaxalone 400 MG tablet Commonly known as: SKELAXIN TAKE 1 TABLET (400 MG TOTAL) BY MOUTH 3 (THREE) TIMES DAILY AS NEEDED (PAIN).   NovoLOG 100 UNIT/ML injection Generic drug: insulin aspart Inject 60 Units into the skin See admin instructions. With pump. Through out the day   potassium chloride SA 20 MEQ tablet Commonly known as: KLOR-CON Take 1 tablet (20 mEq total) by mouth 2 (two) times daily.   Potassium Citrate 15 MEQ (1620 MG) Tbcr Take 1 tablet by mouth 2 (two) times daily.   predniSONE 50 MG tablet Commonly known as: DELTASONE Take 50 mg by mouth once. 1 hour before procedure   senna-docusate 8.6-50 MG tablet Commonly known as: Senokot-S Take 1 tablet by mouth 2 (two) times daily. While taking strong pain meds to prevent constipation   sildenafil 20 MG tablet Commonly known as: REVATIO Take 20 mg by mouth daily as needed (erectile dysfunction).   SYSTANE OP Place 1 drop into both eyes every 8 (eight) hours as needed (dry eyes).   tamsulosin 0.4 MG Caps capsule Commonly known as: FLOMAX Take 1 capsule (0.4 mg total) by mouth daily. What changed: when to take this   Vitamin D 50  MCG (2000 UT) tablet Take 2,000 Units by mouth daily.      Follow-up Information    Irine Seal, MD On 12/09/2019.   Specialty: Urology Why: [place] information: [place] [address] [phone]           Signed: Alexis Frock 11/29/2019, 7:34 AM

## 2019-11-29 NOTE — Progress Notes (Signed)
Discharge instructions reviewed with patient and patients wife at bedside, all questions answered. Pt voided 149ml of yellow urine post foley removal. IVs removed. Patient discharged in stable condition.

## 2019-12-01 ENCOUNTER — Other Ambulatory Visit: Payer: Self-pay | Admitting: Internal Medicine

## 2019-12-01 DIAGNOSIS — E785 Hyperlipidemia, unspecified: Secondary | ICD-10-CM

## 2019-12-03 ENCOUNTER — Emergency Department (HOSPITAL_COMMUNITY)
Admission: EM | Admit: 2019-12-03 | Discharge: 2019-12-03 | Disposition: A | Discharge status: Home / Self Care | Payer: Medicare Other | Attending: Emergency Medicine | Admitting: Emergency Medicine

## 2019-12-03 ENCOUNTER — Other Ambulatory Visit: Payer: Self-pay

## 2019-12-03 ENCOUNTER — Emergency Department (HOSPITAL_COMMUNITY): Payer: Medicare Other

## 2019-12-03 DIAGNOSIS — E1022 Type 1 diabetes mellitus with diabetic chronic kidney disease: Secondary | ICD-10-CM | POA: Diagnosis not present

## 2019-12-03 DIAGNOSIS — N200 Calculus of kidney: Secondary | ICD-10-CM | POA: Diagnosis not present

## 2019-12-03 DIAGNOSIS — R300 Dysuria: Secondary | ICD-10-CM | POA: Insufficient documentation

## 2019-12-03 DIAGNOSIS — Z960 Presence of urogenital implants: Secondary | ICD-10-CM | POA: Insufficient documentation

## 2019-12-03 DIAGNOSIS — Z9641 Presence of insulin pump (external) (internal): Secondary | ICD-10-CM | POA: Insufficient documentation

## 2019-12-03 DIAGNOSIS — F17210 Nicotine dependence, cigarettes, uncomplicated: Secondary | ICD-10-CM | POA: Diagnosis not present

## 2019-12-03 DIAGNOSIS — Z79899 Other long term (current) drug therapy: Secondary | ICD-10-CM | POA: Insufficient documentation

## 2019-12-03 DIAGNOSIS — Z7982 Long term (current) use of aspirin: Secondary | ICD-10-CM | POA: Insufficient documentation

## 2019-12-03 DIAGNOSIS — N181 Chronic kidney disease, stage 1: Secondary | ICD-10-CM | POA: Diagnosis not present

## 2019-12-03 DIAGNOSIS — Z85038 Personal history of other malignant neoplasm of large intestine: Secondary | ICD-10-CM | POA: Insufficient documentation

## 2019-12-03 DIAGNOSIS — Z8546 Personal history of malignant neoplasm of prostate: Secondary | ICD-10-CM | POA: Insufficient documentation

## 2019-12-03 DIAGNOSIS — I129 Hypertensive chronic kidney disease with stage 1 through stage 4 chronic kidney disease, or unspecified chronic kidney disease: Secondary | ICD-10-CM | POA: Insufficient documentation

## 2019-12-03 DIAGNOSIS — R1031 Right lower quadrant pain: Secondary | ICD-10-CM | POA: Diagnosis not present

## 2019-12-03 DIAGNOSIS — N23 Unspecified renal colic: Secondary | ICD-10-CM | POA: Diagnosis not present

## 2019-12-03 DIAGNOSIS — R52 Pain, unspecified: Secondary | ICD-10-CM | POA: Diagnosis not present

## 2019-12-03 DIAGNOSIS — I959 Hypotension, unspecified: Secondary | ICD-10-CM | POA: Diagnosis not present

## 2019-12-03 DIAGNOSIS — I499 Cardiac arrhythmia, unspecified: Secondary | ICD-10-CM | POA: Diagnosis not present

## 2019-12-03 LAB — CBC WITH DIFFERENTIAL/PLATELET
Abs Immature Granulocytes: 0.06 10*3/uL (ref 0.00–0.07)
Basophils Absolute: 0.1 10*3/uL (ref 0.0–0.1)
Basophils Relative: 0 %
Eosinophils Absolute: 0.4 10*3/uL (ref 0.0–0.5)
Eosinophils Relative: 3 %
HCT: 40 % (ref 39.0–52.0)
Hemoglobin: 13.2 g/dL (ref 13.0–17.0)
Immature Granulocytes: 0 %
Lymphocytes Relative: 15 %
Lymphs Abs: 2.1 10*3/uL (ref 0.7–4.0)
MCH: 30.6 pg (ref 26.0–34.0)
MCHC: 33 g/dL (ref 30.0–36.0)
MCV: 92.6 fL (ref 80.0–100.0)
Monocytes Absolute: 0.9 10*3/uL (ref 0.1–1.0)
Monocytes Relative: 7 %
Neutro Abs: 9.9 10*3/uL — ABNORMAL HIGH (ref 1.7–7.7)
Neutrophils Relative %: 75 %
Platelets: 235 10*3/uL (ref 150–400)
RBC: 4.32 MIL/uL (ref 4.22–5.81)
RDW: 14.4 % (ref 11.5–15.5)
WBC: 13.4 10*3/uL — ABNORMAL HIGH (ref 4.0–10.5)
nRBC: 0 % (ref 0.0–0.2)

## 2019-12-03 LAB — URINALYSIS, ROUTINE W REFLEX MICROSCOPIC
Bilirubin Urine: NEGATIVE
Glucose, UA: 50 mg/dL — AB
Ketones, ur: 20 mg/dL — AB
Nitrite: NEGATIVE
Protein, ur: 30 mg/dL — AB
Specific Gravity, Urine: 1.019 (ref 1.005–1.030)
pH: 6 (ref 5.0–8.0)

## 2019-12-03 LAB — BASIC METABOLIC PANEL
Anion gap: 13 (ref 5–15)
BUN: 18 mg/dL (ref 8–23)
CO2: 22 mmol/L (ref 22–32)
Calcium: 8.4 mg/dL — ABNORMAL LOW (ref 8.9–10.3)
Chloride: 101 mmol/L (ref 98–111)
Creatinine, Ser: 1.53 mg/dL — ABNORMAL HIGH (ref 0.61–1.24)
GFR calc Af Amer: 54 mL/min — ABNORMAL LOW (ref >60)
GFR calc non Af Amer: 46 mL/min — ABNORMAL LOW (ref >60)
Glucose, Bld: 199 mg/dL — ABNORMAL HIGH (ref 70–99)
Potassium: 4.6 mmol/L (ref 3.5–5.1)
Sodium: 136 mmol/L (ref 135–145)

## 2019-12-03 MED ORDER — KETOROLAC TROMETHAMINE 30 MG/ML IJ SOLN: 30.0000 mg | Freq: Once | INTRAMUSCULAR | Status: AC

## 2019-12-03 MED ORDER — MORPHINE SULFATE (PF) 4 MG/ML IV SOLN
4.0000 mg | INTRAVENOUS | Status: DC | PRN
  Administered 2019-12-03: 4 mg via INTRAVENOUS
  Filled 2019-12-03: qty 1

## 2019-12-03 MED ORDER — KETOROLAC TROMETHAMINE 30 MG/ML IJ SOLN
INTRAMUSCULAR | Status: AC
  Administered 2019-12-03: 30 mg via INTRAVENOUS
  Filled 2019-12-03: qty 1

## 2019-12-03 MED ORDER — ONDANSETRON HCL 4 MG/2ML IJ SOLN
4.0000 mg | Freq: Once | INTRAMUSCULAR | Status: AC
  Administered 2019-12-03: 4 mg via INTRAVENOUS
  Filled 2019-12-03: qty 2

## 2019-12-03 MED ORDER — FENTANYL CITRATE (PF) 100 MCG/2ML IJ SOLN
100.0000 ug | Freq: Once | INTRAMUSCULAR | Status: AC
  Administered 2019-12-03: 100 ug via INTRAVENOUS
  Filled 2019-12-03: qty 2

## 2019-12-03 MED ORDER — TAMSULOSIN HCL 0.4 MG PO CAPS
0.4000 mg | ORAL_CAPSULE | Freq: Once | ORAL | Status: AC
  Administered 2019-12-03: 0.4 mg via ORAL
  Filled 2019-12-03: qty 1

## 2019-12-03 MED ORDER — KETOROLAC TROMETHAMINE 30 MG/ML IJ SOLN
30.0000 mg | Freq: Once | INTRAMUSCULAR | Status: AC
  Administered 2019-12-03: 30 mg via INTRAVENOUS
  Filled 2019-12-03: qty 1

## 2019-12-03 MED ORDER — ONDANSETRON HCL 4 MG/2ML IJ SOLN
4.0000 mg | INTRAMUSCULAR | Status: DC | PRN
  Administered 2019-12-03: 4 mg via INTRAVENOUS
  Filled 2019-12-03: qty 2

## 2019-12-03 NOTE — ED Notes (Signed)
Pt requesting ice chips or water. MD aware. Awaiting orders.

## 2019-12-03 NOTE — ED Provider Notes (Addendum)
Renal stent in place.  Severe pain.  Follow-up CT scan and call Dr. Gloriann Loan with results. Physical Exam  BP (!) 189/67 (BP Location: Left Arm)   Pulse 94   Temp 97.8 F (36.6 C) (Oral)   Resp 16   SpO2 96%   Physical Exam Patient is alert and appropriate.  He is sitting at the edge of the bed having a paroxysmal pain.  No respiratory distress.  Status clear. ED Course/Procedures     Procedures  MDM  CT scan shows stent have good placement.  Note is made of small effusion or atelectasis base of right lung.  Patient denies he had any coughing or shortness of breath.  He reports his pain symptoms are coming after urinating or feeling that he needs to urinate.  At that time he gets intense spasms of pain in his flank.   08: 30 we will order morphine 4 mg and Zofran as needed.  Will reconsult urology for intractable recurrent pain episodes.  Consult: Reviewed with Dr. Gloriann Loan.  Suggested giving the patient Toradol and Flomax.  Reassess and see if he is adequately comfortable.  If not likely plan will be for patient to go to office for stent removal today.  Recheck 12: 24.  Patient has been much more comfortable.  I have checked on him several times.  He has been trying to urinate to make sure that he did not go into spasms of pain again.  He has been taking fluids orally but has not needed to urinate.  He will try again to see if this elicits a severe pain episode.  He is not needing additional pain medications currently.  Patient remained pain control.  He was able to urinate.  However, after extensive discussion, he does not feel comfortable going home with the stent in place.  He is concerned that despite treatment with ibuprofen and Flomax and Vicodin, that he will get a severe pain paroxysmal similar to what brought him to the emergency department.  Patient wishes to go to [facility] urology have the stent removed.  14: 26 I reviewed this with Dr. Gloriann Loan and he advises to send the patient directly  to the office where he will remove the stent.       Charlesetta Shanks, MD 12/03/19 [place], MD 12/03/19 1426

## 2019-12-03 NOTE — ED Triage Notes (Signed)
PER EMS: Pt is coming from home with c/o dysuria. Pt had surgery on Thursday for his kidney stones and a stent was placed. Since 12/02/2019 morning pt has had pain with urination. Pain is on the right side of his abdomen and radiates to his flank. Pt had 264ml of output since 0630pm last night.   20G LHAND  EMS VITALS: BP 130/56 HR 82  CBG 149

## 2019-12-03 NOTE — ED Notes (Signed)
EDP at bedside  

## 2019-12-03 NOTE — ED Notes (Signed)
Pts dressing to right back changed at this time. No signs of infection noted.

## 2019-12-03 NOTE — Discharge Instructions (Addendum)
1.  Go directly to [facility] urology office for your stent removal.

## 2019-12-03 NOTE — ED Provider Notes (Signed)
[facility] DEPT Provider Note   CSN: [id number] Arrival date & time: 12/03/19  0540     History No chief complaint on file.   Anthony Daniel. is a 69 y.o. male.  The history is provided by the patient.  Flank Pain This is a new problem. The current episode started 12 to 24 hours ago. The problem occurs constantly. The problem has been rapidly worsening. Pertinent negatives include no chest pain. Exacerbated by: movement/palpation. The symptoms are relieved by rest.  Patient with history of chronic kidney disease, kidney stones, hypertension presents with flank pain. Patient underwent a percutaneous nephrostolithotomy on May 13, also had exchange of a right ureteral stent. Reports approximate 12 hours ago he had sudden onset of right flank pain that radiates to his abdomen.  No fevers.  He reports dysuria.     Past Medical History:  Diagnosis Date  . Arthritis    Back  . Cancer ([place])    bladder  . CKD (chronic kidney disease), stage I   . History of bladder cancer    1998--  TCC  . History of kidney stones   . Hyperlipidemia   . Hypertension   . Insulin pump in place   . LADA (latent autoimmune diabetes in adults), managed as type 1 [facility])    endocrinologist-- dr Buddy Duty  . Nephrolithiasis    bilateral --  nonobstructive per CT  . PONV (postoperative nausea and vomiting)   . Spinal stenosis, cervical region     Patient Active Problem List   Diagnosis Date Noted  . Right kidney stone 11/26/2019  . Diuretic-induced hypokalemia 08/29/2019  . Prostate cancer ([place]) 09/06/2018  . Tobacco abuse 07/17/2018  . Essential hypertension 03/07/2018  . Seasonal allergic rhinitis due to pollen 09/26/2017  . Spinal stenosis in cervical region 12/01/2015  . Sleep apnea in adult 06/24/2015  . DDD (degenerative disc disease), cervical 11/10/2014  . Background diabetic retinopathy ([place]) 08/05/2013  . Hyperlipidemia with target LDL less than 100 11/05/2012   . Routine general medical examination at a health care facility 06/26/2012  . LADA (latent autoimmune diabetes in adults), managed as type 1 ([place]) 06/26/2012    Past Surgical History:  Procedure Laterality Date  . COLONOSCOPY  2018   x2  . CYSTOSCOPY W/ URETERAL STENT PLACEMENT Right 05/12/2015   Procedure: CYSTOSCOPY WITH STENT REPLACEMENT;  Surgeon: Cleon Gustin, MD;  Location: [facility];  Service: Urology;  Laterality: Right;  . CYSTOSCOPY WITH RETROGRADE PYELOGRAM, URETEROSCOPY AND STENT PLACEMENT Right 01/23/2019   Procedure: CYSTOSCOPY WITH RIGHT RETROGRADE PYELOGRAM, URETEROSCOPY AND STENT PLACEMENT;  Surgeon: Irine Seal, MD;  Location: [facility];  Service: Urology;  Laterality: Right;  . CYSTOSCOPY WITH URETEROSCOPY AND STENT PLACEMENT Right 04/23/2015   Procedure: CYSTOSCOPY WITH URETEROSCOPY RETROGRADE PYELOGRAM AND STENT PLACEMENT;  Surgeon: Cleon Gustin, MD;  Location: [facility];  Service: Urology;  Laterality: Right;  . CYSTOSCOPY/RETROGRADE/URETEROSCOPY/STONE EXTRACTION WITH BASKET Right 05/12/2015   Procedure: CYSTOSCOPY/RETROGRADE/URETEROSCOPY/STONE EXTRACTION WITH BASKET;  Surgeon: Cleon Gustin, MD;  Location: [facility];  Service: Urology;  Laterality: Right;  . CYSTOSCOPY/URETEROSCOPY/HOLMIUM LASER/STENT PLACEMENT Left 09/18/2018   Procedure: CYSTOSCOPY/ LEFT RETROGRADE LEFT URETEROSCOPY [place] LASER/STENT PLACEMENT;  Surgeon: Irine Seal, MD;  Location: [facility];  Service: Urology;  Laterality: Left;  . CYSTOSCOPY/URETEROSCOPY/HOLMIUM LASER/STENT PLACEMENT Right 10/24/2019   Procedure: CYSTOSCOPY/URETEROSCOPY/HOLMIUM LASER/STENT PLACEMENT;  Surgeon: Irine Seal, MD;  Location: [facility];  Service: Urology;  Laterality: Right;  . DENTAL  SURGERY    . EXTRACORPOREAL SHOCK WAVE LITHOTRIPSY  x2 in  2006//   x2  in 2012  . HOLMIUM LASER APPLICATION Right [id number]   Procedure: HOLMIUM LASER APPLICATION;  Surgeon: Cleon Gustin, MD;  Location: [facility];  Service: Urology;  Laterality: Right;  . HOLMIUM LASER APPLICATION Right [id number]   Procedure: HOLMIUM LASER APPLICATION;  Surgeon: Irine Seal, MD;  Location: [facility];  Service: Urology;  Laterality: Right;  . IR URETERAL STENT RIGHT NEW ACCESS W/O SEP NEPHROSTOMY CATH  11/25/2019  . LAPAROSCOPIC APPENDECTOMY N/A 11/07/2012   Procedure: APPENDECTOMY LAPAROSCOPIC;  Surgeon: Harl Bowie, MD;  Location: [facility] [facility];  Service: General;  Laterality: N/A;  . NEPHROLITHOTOMY Left 08/06/2015   Procedure: 1ST STAGE  LEFT PERCUTANEOUS NEPHROLITHOTOMY ;  Surgeon: Irine Seal, MD;  Location: [facility];  Service: Urology;  Laterality: Left;  . NEPHROLITHOTOMY Right 11/26/2019   Procedure: NEPHROLITHOTOMY PERCUTANEOUS;  Surgeon: Irine Seal, MD;  Location: [facility];  Service: Urology;  Laterality: Right;  . NEPHROLITHOTOMY Right 11/28/2019   Procedure: NEPHROLITHOTOMY PERCUTANEOUS SECOND LOOK;  Surgeon: Alexis Frock, MD;  Location: [facility];  Service: Urology;  Laterality: Right;  . TONSILLECTOMY  1975  . TRANSURETHRAL RESECTION OF BLADDER TUMOR  1998       Family History  Problem Relation Age of Onset  . Cancer Other        Colon and Prostate Cancer  . Hypertension Other   . Diabetes Other   . Alcohol abuse Neg Hx   . Drug abuse Neg Hx   . Early death Neg Hx   . Heart disease Neg Hx   . Hyperlipidemia Neg Hx   . Kidney disease Neg Hx   . Stroke Neg Hx     Social History   Tobacco Use  . Smoking status: Current Every Day Smoker    Packs/day: 0.50    Years: 30.00    Pack years: 15.00    Types: Cigarettes  . Smokeless tobacco: Never Used  Substance Use Topics  . Alcohol use: Not Currently    Alcohol/week: 5.0 standard drinks    Types: 5 Shots of liquor per week    Comment: occasional  . Drug use: No    Home Medications Prior to Admission medications   Medication Sig Start Date End Date Taking? Authorizing Provider  aspirin EC 81 MG  tablet Take 81 mg by mouth daily.    [provider]  Cholecalciferol (VITAMIN D) 2000 units tablet Take 2,000 Units by mouth daily.    [provider]  Ciclopirox 0.77 % gel APPLY TO AFFECTED AREA TWICE A DAY Patient taking differently: Apply 1 application topically as needed (raash).  09/19/19   Janith Lima, MD  diphenhydrAMINE (BENADRYL) 25 MG tablet Take 50 mg by mouth once. 2 tablets 1 hour before procedure    [provider]  ezetimibe (ZETIA) 10 MG tablet Take 1 tablet (10 mg total) by mouth daily. TAKE 1 TABLET BY MOUTH EVERY DAY 08/30/19   Janith Lima, MD  famotidine (PEPCID) 20 MG tablet Take 20 mg by mouth once. 1 hour before procedure    [provider]  fluticasone (FLONASE) 50 MCG/ACT nasal spray Place 2 sprays into both nostrils daily. Patient taking differently: Place 2 sprays into both nostrils daily as needed for allergies.  08/07/18   Marrian Salvage, FNP  HYDROcodone-acetaminophen (NORCO/VICODIN) 5-325 MG tablet Take 1 tablet by mouth every 6 (six) hours as needed  for moderate pain. 11/26/19 11/25/20  Irine Seal, MD  HYDROcodone-acetaminophen (NORCO/VICODIN) 5-325 MG tablet Take 1-2 tablets by mouth every 6 (six) hours as needed for moderate pain or severe pain. Post-operatively 11/29/19   Alexis Frock, MD  indapamide (LOZOL) 1.25 MG tablet TAKE 1 TABLET (1.25 MG TOTAL) BY MOUTH DAILY. 11/17/19   Janith Lima, MD  Insulin Glargine (LANTUS SOLOSTAR [place]) Inject 15 Units into the skin 2 (two) times daily as needed. Only uses when pump goes out    [provider]  Insulin Human (INSULIN PUMP) SOLN Inject 1 each into the skin continuous. Novolog insulin    [provider]  irbesartan (AVAPRO) 300 MG tablet Take 1 tablet (300 mg total) by mouth daily. 08/28/19   Janith Lima, MD  levocetirizine (XYZAL) 5 MG tablet Take 1 tablet (5 mg total) by mouth daily as needed for allergies. 08/30/19   Janith Lima, MD  LIVALO 2  MG TABS TAKE 1 TABLET BY MOUTH EVERY MORNING 12/01/19   Janith Lima, MD  metaxalone (SKELAXIN) 400 MG tablet TAKE 1 TABLET (400 MG TOTAL) BY MOUTH 3 (THREE) TIMES DAILY AS NEEDED (PAIN). 06/03/19   Janith Lima, MD  NOVOLOG 100 UNIT/ML injection Inject 60 Units into the skin See admin instructions. With pump. Through out the day 12/15/15   [provider]  Polyethyl Glycol-Propyl Glycol (SYSTANE OP) Place 1 drop into both eyes every 8 (eight) hours as needed (dry eyes).    [provider]  potassium chloride SA (KLOR-CON) 20 MEQ tablet Take 1 tablet (20 mEq total) by mouth 2 (two) times daily. 08/29/19   Janith Lima, MD  Potassium Citrate 15 MEQ (1620 MG) TBCR Take 1 tablet by mouth 2 (two) times daily.    [provider]  predniSONE (DELTASONE) 50 MG tablet Take 50 mg by mouth once. 1 hour before procedure    [provider]  senna-docusate (SENOKOT-S) 8.6-50 MG tablet Take 1 tablet by mouth 2 (two) times daily. While taking strong pain meds to prevent constipation 11/29/19   Alexis Frock, MD  sildenafil (REVATIO) 20 MG tablet Take 20 mg by mouth daily as needed (erectile dysfunction).    [provider]  tamsulosin (FLOMAX) 0.4 MG CAPS capsule Take 1 capsule (0.4 mg total) by mouth daily. Patient taking differently: Take 0.4 mg by mouth every morning.  04/22/15   Carmin Muskrat, MD  indapamide (LOZOL) 1.25 MG tablet TAKE 1 TABLET (1.25 MG TOTAL) BY MOUTH DAILY. 08/06/19   Janith Lima, MD  LIVALO 2 MG TABS TAKE 1 TABLET BY MOUTH EVERY MORNING 06/03/19   Janith Lima, MD    Allergies    Altace [ramipril], Crestor [rosuvastatin], Lipitor [atorvastatin], Contrast media [iodinated diagnostic agents], Iodine, Latex, Oxycodone, Adhesive [tape], and Peanut-containing drug products  Review of Systems   Review of Systems  Constitutional: Negative for fever.  Cardiovascular: Negative for chest pain.  Genitourinary: Positive for dysuria and  flank pain.  All other systems reviewed and are negative.   Physical Exam Updated Vital Signs BP (!) 189/67 (BP Location: Left Arm)   Pulse 94   Temp 97.8 F (36.6 C) (Oral)   Resp 16   SpO2 96%   Physical Exam CONSTITUTIONAL: Elderly, uncomfortable appearing HEAD: Normocephalic/atraumatic EYES: EOMI ENMT: Mucous membranes moist NECK: supple no meningeal signs SPINE/BACK:entire spine nontender CV: S1/S2 noted, no murmurs/rubs/gallops noted LUNGS: Lungs are clear to auscultation bilaterally, no apparent distress ABDOMEN: soft, nontender, no rebound  or guarding, bowel sounds noted throughout abdomen GU: Bandage noted to right flank.  Right CVA tenderness noted NEURO: Pt is awake/alert/appropriate, moves all extremitiesx4.  No facial droop.  EXTREMITIES: pulses normal/equal SKIN: warm, color normal PSYCH: Mildly anxious  ED Results / Procedures / Treatments   Labs (all labs ordered are listed, but only abnormal results are displayed) Labs Reviewed  BASIC METABOLIC PANEL - Abnormal; Notable for the following components:      Result Value   Glucose, Bld 199 (*)    Creatinine, Ser 1.53 (*)    Calcium 8.4 (*)    GFR calc non Af Amer 46 (*)    GFR calc Af Amer 54 (*)    All other components within normal limits  CBC WITH DIFFERENTIAL/PLATELET - Abnormal; Notable for the following components:   WBC 13.4 (*)    Neutro Abs 9.9 (*)    All other components within normal limits  URINALYSIS, ROUTINE W REFLEX MICROSCOPIC - Abnormal; Notable for the following components:   Glucose, UA 50 (*)    Hgb urine dipstick MODERATE (*)    Ketones, ur 20 (*)    Protein, ur 30 (*)    Leukocytes,Ua SMALL (*)    Bacteria, UA RARE (*)    All other components within normal limits  URINE CULTURE    EKG None  Radiology No results found.  Procedures Procedures   Medications Ordered in ED Medications  fentaNYL (SUBLIMAZE) injection 100 mcg (100 mcg Intravenous Given 12/03/19 0642)    ondansetron (ZOFRAN) injection 4 mg (4 mg Intravenous Given 12/03/19 [id number])    ED Course  I have reviewed the triage vital signs and the nursing notes.  Pertinent labs & imaging results that were available during my care of the patient were reviewed by me and considered in my medical decision making (see chart for details).    MDM Rules/Calculators/A&P                      7:26 AM Pt with recent ureteral stent placement He is having severe pain D/w dr bell with urology He requests CT renal and call back Signed out to Dr. Johnney Killian to f/u on ct imaging  Final Clinical Impression(s) / ED Diagnoses Final diagnoses:  None    Rx / DC Orders ED Discharge Orders    None       Ripley Fraise, MD 12/03/19 [phone]

## 2019-12-04 LAB — URINE CULTURE: Culture: NO GROWTH

## 2019-12-09 DIAGNOSIS — N200 Calculus of kidney: Secondary | ICD-10-CM | POA: Diagnosis not present

## 2020-01-13 ENCOUNTER — Encounter: Payer: Self-pay | Admitting: Internal Medicine

## 2020-01-13 ENCOUNTER — Ambulatory Visit (INDEPENDENT_AMBULATORY_CARE_PROVIDER_SITE_OTHER): Payer: Medicare Other

## 2020-01-13 ENCOUNTER — Other Ambulatory Visit: Payer: Self-pay

## 2020-01-13 ENCOUNTER — Ambulatory Visit (INDEPENDENT_AMBULATORY_CARE_PROVIDER_SITE_OTHER): Payer: Medicare Other | Admitting: Internal Medicine

## 2020-01-13 ENCOUNTER — Telehealth: Payer: Self-pay

## 2020-01-13 VITALS — BP 116/62 | HR 96 | Temp 97.6°F | Resp 16 | Ht 73.0 in | Wt 247.0 lb

## 2020-01-13 DIAGNOSIS — S80212A Abrasion, left knee, initial encounter: Secondary | ICD-10-CM | POA: Diagnosis not present

## 2020-01-13 DIAGNOSIS — S8992XA Unspecified injury of left lower leg, initial encounter: Secondary | ICD-10-CM

## 2020-01-13 DIAGNOSIS — Z23 Encounter for immunization: Secondary | ICD-10-CM

## 2020-01-13 NOTE — Telephone Encounter (Signed)
Key: [id number]  PA for Livalo was denied.   They prefer Vytorin (ezetemibe-simvastatin) to be tried and failed.   I can appeal the determination. Please advise

## 2020-01-13 NOTE — Progress Notes (Signed)
Subjective:  [name]., male    DOB: Jul 03, 1951  Age: 69 y.o. MRN: [id number]  CC: Knee Pain     HPI Kriston Pasquarello. presents for concerns about his left knee.  He was in [place] about 8 days ago when he stepped into a pool and injured his left knee.  It was a blunt trauma over the tibial tuberosity.  He developed an abrasion with pain and swelling.  He says the knee felt unstable for a few days but all other symptoms are rapidly improving.  He is controlling the pain with Aleve, hydrocodone, and acetaminophen.  Outpatient Medications Prior to Visit  Medication Sig Dispense Refill  . aspirin EC 81 MG tablet Take 81 mg by mouth daily.    . Cholecalciferol (VITAMIN D) 2000 units tablet Take 2,000 Units by mouth daily.    . Ciclopirox 0.77 % gel APPLY TO AFFECTED AREA TWICE A DAY (Patient taking differently: Apply 1 application topically as needed (raash). ) 30 g 2  . ezetimibe (ZETIA) 10 MG tablet Take 1 tablet (10 mg total) by mouth daily. TAKE 1 TABLET BY MOUTH EVERY DAY 90 tablet 1  . fluticasone (FLONASE) 50 MCG/ACT nasal spray Place 2 sprays into both nostrils daily. (Patient taking differently: Place 2 sprays into both nostrils daily as needed for allergies. ) 16 g 3  . HYDROcodone-acetaminophen (NORCO/VICODIN) 5-325 MG tablet Take 1 tablet by mouth every 6 (six) hours as needed for moderate pain. 15 tablet 0  . HYDROcodone-acetaminophen (NORCO/VICODIN) 5-325 MG tablet Take 1-2 tablets by mouth every 6 (six) hours as needed for moderate pain or severe pain. Post-operatively 15 tablet 0  . indapamide (LOZOL) 1.25 MG tablet TAKE 1 TABLET (1.25 MG TOTAL) BY MOUTH DAILY. 90 tablet 1  . Insulin Glargine (LANTUS SOLOSTAR [place]) Inject 15 Units into the skin 2 (two) times daily as needed. Only uses when pump goes out    . Insulin Human (INSULIN PUMP) SOLN Inject 1 each into the skin continuous. Novolog insulin    . irbesartan (AVAPRO) 300 MG tablet Take 1 tablet (300 mg total) by  mouth daily. 90 tablet 1  . levocetirizine (XYZAL) 5 MG tablet Take 1 tablet (5 mg total) by mouth daily as needed for allergies. 90 tablet 1  . LIVALO 2 MG TABS TAKE 1 TABLET BY MOUTH EVERY MORNING (Patient taking differently: Take 2 mg by mouth daily. ) 90 tablet 1  . metaxalone (SKELAXIN) 400 MG tablet TAKE 1 TABLET (400 MG TOTAL) BY MOUTH 3 (THREE) TIMES DAILY AS NEEDED (PAIN). 270 tablet 0  . NOVOLOG 100 UNIT/ML injection Inject 60 Units into the skin See admin instructions. With pump. Through out the day    . Polyethyl Glycol-Propyl Glycol (SYSTANE OP) Place 1 drop into both eyes every 8 (eight) hours as needed (dry eyes).    . potassium chloride SA (KLOR-CON) 20 MEQ tablet Take 1 tablet (20 mEq total) by mouth 2 (two) times daily. 180 tablet 1  . Potassium Citrate 15 MEQ (1620 MG) TBCR Take 1 tablet by mouth 2 (two) times daily.    Marland Kitchen senna-docusate (SENOKOT-S) 8.6-50 MG tablet Take 1 tablet by mouth 2 (two) times daily. While taking strong pain meds to prevent constipation 10 tablet 0  . sildenafil (REVATIO) 20 MG tablet Take 20 mg by mouth daily as needed (erectile dysfunction).    . tamsulosin (FLOMAX) 0.4 MG CAPS capsule Take 1 capsule (0.4 mg total) by mouth daily. (Patient taking  differently: Take 0.4 mg by mouth every morning. ) 30 capsule 0   No facility-administered medications prior to visit.    ROS Review of Systems  Constitutional: Negative.   Genitourinary: Negative.  Negative for difficulty urinating.  Musculoskeletal: Positive for arthralgias. Negative for myalgias.  Skin: Positive for wound.  Neurological: Negative.  Negative for dizziness, numbness and headaches.  Hematological: Negative for adenopathy. Does not bruise/bleed easily.  Psychiatric/Behavioral: The patient is not nervous/anxious.   All other systems reviewed and are negative.   Objective:  BP 116/62 (BP Location: Left Arm, Patient Position: Sitting, Cuff Size: Large)   Pulse 96   Temp 97.6 F (36.4  C) (Oral)   Resp 16   Ht 6\' 1"  (1.854 m)   Wt 247 lb (112 kg)   SpO2 97%   BMI 32.59 kg/m   BP Readings from Last 3 Encounters:  01/13/20 116/62  12/03/19 138/70  11/29/19 (!) 147/36    Wt Readings from Last 3 Encounters:  01/13/20 247 lb (112 kg)  11/28/19 255 lb 1.2 oz (115.7 kg)  11/20/19 249 lb (112.9 kg)    Physical Exam Vitals reviewed.  Constitutional:      Appearance: Normal appearance.  HENT:     Nose: Nose normal.     Mouth/Throat:     Mouth: Mucous membranes are moist.  Eyes:     Conjunctiva/sclera: Conjunctivae normal.  Cardiovascular:     Rate and Rhythm: Normal rate and regular rhythm.     Heart sounds: No murmur heard.   Pulmonary:     Effort: Pulmonary effort is normal.     Breath sounds: No stridor. No wheezing, rhonchi or rales.  Abdominal:     General: Abdomen is flat.  Musculoskeletal:        General: Deformity present. Normal range of motion.     Cervical back: Neck supple.     Lumbar back: Normal.     Right knee: Normal.     Left knee: Swelling, bony tenderness and crepitus present. No deformity, effusion, erythema or ecchymosis. Normal range of motion. Tenderness present over the lateral joint line. No LCL laxity, MCL laxity, ACL laxity or PCL laxity.Normal alignment.     Comments: There are healed abrasions over the TT.  The entire knee is mildly swollen.  Lymphadenopathy:     Cervical: No cervical adenopathy.  Skin:    General: Skin is warm and dry.  Neurological:     Mental Status: He is alert.     Lab Results  Component Value Date   WBC 13.4 (H) 12/03/2019   HGB 13.2 12/03/2019   HCT 40.0 12/03/2019   PLT 235 12/03/2019   GLUCOSE 199 (H) 12/03/2019   CHOL 109 08/28/2019   TRIG 106.0 08/28/2019   HDL 38.90 (L) 08/28/2019   LDLCALC 49 08/28/2019   ALT 21 08/30/2019   AST 20 08/30/2019   NA 136 12/03/2019   K 4.6 12/03/2019   CL 101 12/03/2019   CREATININE 1.53 (H) 12/03/2019   BUN 18 12/03/2019   CO2 22 12/03/2019    TSH 1.31 08/28/2019   PSA 6.08 08/21/2019   INR 1.0 11/25/2019   HGBA1C 7.9 (H) 11/20/2019   MICROALBUR 1.3 08/28/2019    CT Renal Stone Study  Result Date: 12/03/2019 CLINICAL DATA:  Right flank pain, dysuria. History of nephrolithotomy 11/26/2019 EXAM: CT ABDOMEN AND PELVIS WITHOUT CONTRAST TECHNIQUE: Multidetector CT imaging of the abdomen and pelvis was performed following the standard protocol without IV contrast. COMPARISON:  11/27/2019 FINDINGS: Lower chest: Small right pleural effusion with dependent right basilar airspace consolidation. Mild left basilar atelectasis. Coronary artery calcification. Hepatobiliary: No focal liver abnormality is seen. No gallstones, gallbladder wall thickening, or biliary dilatation. Pancreas: Unremarkable. No pancreatic ductal dilatation or surrounding inflammatory changes. Spleen: Normal in size without focal abnormality. Adrenals/Urinary Tract: Unremarkable adrenal glands. Interval removal of the previously seen right percutaneous nephroureteral stents with interval placement of a well-positioned double-J right-sided nephroureteral stent. Small amount of residual gas within the right renal collecting system, decreased from prior. There are a few small locules of residual gas within the right perirenal space (series 2, images 40-41), decreased from prior. The previously seen large right renal calculus is no longer evident. There is a tiny 1 mm punctate residual stone within the inferior calyx of the right kidney. Multiple nonobstructing left-sided renal stones, the 2 largest measuring 8 and 7 mm. Left ureter is unremarkable. There is a 5 mm stone located within the dependent portion of the urinary bladder. Bladder appears otherwise unremarkable. Stomach/Bowel: Stomach is within normal limits. Appendix is surgically absent. No evidence of bowel wall thickening, distention, or inflammatory changes. Vascular/Lymphatic: Aortoiliac atherosclerosis without aneurysm. No  abdominopelvic lymphadenopathy. Reproductive: Mildly prominent prostate gland with coarse calcification. Other: No free fluid within the abdomen or pelvis. No abdominal wall hernia. Musculoskeletal: No acute or significant osseous findings. IMPRESSION: 1. Interval placement of a well-positioned double-J right-sided nephroureteral stent without evidence of complication 2. The previously seen large right renal calculus is no longer evident. There is a tiny 1 mm residual stone within the inferior calyx of the right kidney. 3. Multiple nonobstructing left-sided renal stones, the 2 largest measuring 8 mm and 7 mm. 4. There is a 5 mm stone located within the dependent portion of the urinary bladder. 5. Small right pleural effusion with dependent right basilar airspace consolidation, which may represent atelectasis or pneumonia. 6. Aortic Atherosclerosis (ICD10-I70.0). Electronically Signed   By: Davina Poke D.O.   On: 12/03/2019 08:12   DG Knee Complete 4 Views Left  Result Date: 01/13/2020 CLINICAL DATA:  Fall 6 days ago. EXAM: LEFT KNEE - COMPLETE 4+ VIEW COMPARISON:  None. FINDINGS: No evidence of fracture, dislocation, or joint effusion. No evidence of arthropathy or other focal bone abnormality. Mild arterial calcification. IMPRESSION: Negative. Electronically Signed   By: Franchot Gallo M.D.   On: 01/13/2020 16:27    Assessment & Plan:   Teofil was seen today for knee pain.  Diagnoses and all orders for this visit:  Abrasion, left knee, initial encounter -     Tdap vaccine greater than or equal to 7yo IM -     DG Knee Complete 4 Views Left; Future  Left knee injury, initial encounter- His symptoms are rapidly improving.  I do not appreciate any instability today.  Plain films are negative for fracture.  If his knee continues to improve then no additional intervention is needed.  If it does not improve then will consider referral to orthopedics and/or ordering an MRI. -     DG Knee Complete 4  Views Left; Future   I am having Anthony Daniel. "Tommy" maintain his Insulin Glargine (LANTUS SOLOSTAR ), Potassium Citrate, aspirin EC, tamsulosin, Polyethyl Glycol-Propyl Glycol (SYSTANE OP), insulin pump, sildenafil, Vitamin D, NovoLOG, fluticasone, metaxalone, irbesartan, potassium chloride SA, levocetirizine, ezetimibe, Ciclopirox, indapamide, HYDROcodone-acetaminophen, HYDROcodone-acetaminophen, senna-docusate, and Livalo.  No orders of the defined types were placed in this encounter.    Follow-up: Return if symptoms worsen or  fail to improve.  Scarlette Calico, MD

## 2020-01-13 NOTE — Patient Instructions (Signed)
Knee Sprain  A knee sprain is a stretch or tear in a knee ligament. Knee ligaments are bands of tissue that connect bones in the knee to each other. Follow these instructions at home: If you have a splint or brace:  Wear the splint or brace as told by your doctor. Remove it only as told by your doctor.  Loosen the splint or brace if your toes tingle, get numb, or turn cold and blue.  Keep the splint or brace clean.  If the splint or brace is not waterproof: ? Do not let it get wet. ? Cover it with a watertight covering when you take a bath or a shower. If you have a cast:  Do not stick anything inside the cast to scratch your skin.  Check the skin around the cast every day. Tell your doctor about any concerns.  You may put lotion on dry skin around the edges of the cast. Do not put lotion on the skin underneath the cast.  Keep the cast clean.  If the cast is not waterproof: ? Do not let it get wet. ? Cover it with a watertight covering when you take a bath or a shower. Managing pain, stiffness, and swelling   Gently move your toes often to avoid stiffness and to lessen swelling.  Raise (elevate) the injured area above the level of your heart while you are sitting or lying down.  Take over-the-counter and prescription medicines only as told by your doctor.  If directed, put ice on the injured area. ? If you have a removable splint or brace, remove it as told by your doctor. ? Put ice in a plastic bag. ? Place a towel between your skin and the bag or between your cast and the bag. ? Leave the ice on for 20 minutes, 2-3 times a day. General instructions  Do exercises as told by your doctor.  Keep all follow-up visits as told by your doctor. This is important. Contact a doctor if:  You have pain that gets worse.  The cast, brace, or splint does not fit right.  The cast, brace, or splint gets damaged. Get help right away if:  You cannot lean on your knee to stand or  walk.  You cannot move the injured area.  You knee buckles or you have pain after you walk only a few steps.  You have very bad pain, swelling, or numbness below the cast, brace, or splint. Summary  A knee sprain is a stretch or tear in a band (ligament) that connects your knee bones to each other.  You may need to wear a splint, brace, or cast to help your knee get better.  Contact your doctor if you have very bad pain, swelling, or numbness, or if you cannot walk. This information is not intended to replace advice given to you by your health care provider. Make sure you discuss any questions you have with your health care provider. Document Revised: 10/26/2018 Document Reviewed: 03/22/2016 Elsevier Patient Education  2020 [employer]  

## 2020-01-14 ENCOUNTER — Encounter: Payer: Self-pay | Admitting: Internal Medicine

## 2020-01-14 DIAGNOSIS — C61 Malignant neoplasm of prostate: Secondary | ICD-10-CM | POA: Diagnosis not present

## 2020-01-14 LAB — PSA: PSA: 5.66

## 2020-01-15 DIAGNOSIS — N529 Male erectile dysfunction, unspecified: Secondary | ICD-10-CM | POA: Insufficient documentation

## 2020-01-15 NOTE — Telephone Encounter (Signed)
Appeal letter completed, signed and faxed.

## 2020-01-15 NOTE — Telephone Encounter (Signed)
What options does my provider have? If there is relevant information that has not been previously submitted, the treating physician may request a Provider Courtesy Review (PCR) by calling [phone] and ask for Pharmacy Provider Courtesy Reviews or by calling [phone]. You may also send a written request within 180 days to: [facility] Department / Provider [place] [address] [place],  [address]

## 2020-01-16 NOTE — Telephone Encounter (Signed)
Pt contacted and informed that the appeal has been faxed and that we have samples of the Livalo 20 mg tablets for him to pick up at his convenience.

## 2020-01-23 NOTE — Telephone Encounter (Signed)
Pt picked up samples yesterday.   Awaiting response from insurance about appeal.

## 2020-01-24 ENCOUNTER — Telehealth: Payer: Self-pay | Admitting: Internal Medicine

## 2020-01-24 NOTE — Telephone Encounter (Signed)
New message:    Pt is calling and states you agreed to take his mother on as a new patient. Please advise me on this and I will call and schedule the appt. Please advise.

## 2020-01-27 DIAGNOSIS — Z01818 Encounter for other preprocedural examination: Secondary | ICD-10-CM | POA: Diagnosis not present

## 2020-01-28 DIAGNOSIS — Z794 Long term (current) use of insulin: Secondary | ICD-10-CM | POA: Diagnosis not present

## 2020-01-28 DIAGNOSIS — N181 Chronic kidney disease, stage 1: Secondary | ICD-10-CM | POA: Diagnosis not present

## 2020-01-28 DIAGNOSIS — E1022 Type 1 diabetes mellitus with diabetic chronic kidney disease: Secondary | ICD-10-CM | POA: Diagnosis not present

## 2020-01-28 DIAGNOSIS — Z720 Tobacco use: Secondary | ICD-10-CM | POA: Diagnosis not present

## 2020-01-28 DIAGNOSIS — E785 Hyperlipidemia, unspecified: Secondary | ICD-10-CM | POA: Diagnosis not present

## 2020-01-30 DIAGNOSIS — N200 Calculus of kidney: Secondary | ICD-10-CM | POA: Diagnosis not present

## 2020-01-30 DIAGNOSIS — N189 Chronic kidney disease, unspecified: Secondary | ICD-10-CM | POA: Diagnosis not present

## 2020-01-30 DIAGNOSIS — E1022 Type 1 diabetes mellitus with diabetic chronic kidney disease: Secondary | ICD-10-CM | POA: Diagnosis not present

## 2020-01-30 DIAGNOSIS — E669 Obesity, unspecified: Secondary | ICD-10-CM | POA: Diagnosis not present

## 2020-01-30 DIAGNOSIS — M1990 Unspecified osteoarthritis, unspecified site: Secondary | ICD-10-CM | POA: Diagnosis not present

## 2020-01-30 DIAGNOSIS — F17210 Nicotine dependence, cigarettes, uncomplicated: Secondary | ICD-10-CM | POA: Diagnosis not present

## 2020-01-30 DIAGNOSIS — E1040 Type 1 diabetes mellitus with diabetic neuropathy, unspecified: Secondary | ICD-10-CM | POA: Diagnosis not present

## 2020-01-30 DIAGNOSIS — Z6832 Body mass index (BMI) 32.0-32.9, adult: Secondary | ICD-10-CM | POA: Diagnosis not present

## 2020-01-30 HISTORY — PX: CYSTOSCOPY/URETEROSCOPY/HOLMIUM LASER/STENT PLACEMENT: SHX6546

## 2020-01-30 NOTE — Telephone Encounter (Signed)
Appeal for Chanda Busing has been approved. (My chart message sent by patient informing of same).

## 2020-01-30 NOTE — Telephone Encounter (Signed)
Appeal for Anthony Daniel was approved.

## 2020-02-03 ENCOUNTER — Other Ambulatory Visit: Payer: Self-pay | Admitting: Internal Medicine

## 2020-02-03 DIAGNOSIS — B354 Tinea corporis: Secondary | ICD-10-CM

## 2020-02-15 ENCOUNTER — Other Ambulatory Visit: Payer: Self-pay | Admitting: Internal Medicine

## 2020-02-15 DIAGNOSIS — I10 Essential (primary) hypertension: Secondary | ICD-10-CM

## 2020-02-15 DIAGNOSIS — E139 Other specified diabetes mellitus without complications: Secondary | ICD-10-CM

## 2020-02-15 DIAGNOSIS — E785 Hyperlipidemia, unspecified: Secondary | ICD-10-CM

## 2020-02-20 ENCOUNTER — Other Ambulatory Visit: Payer: Self-pay | Admitting: Internal Medicine

## 2020-02-20 DIAGNOSIS — J301 Allergic rhinitis due to pollen: Secondary | ICD-10-CM

## 2020-02-24 DIAGNOSIS — N200 Calculus of kidney: Secondary | ICD-10-CM | POA: Diagnosis not present

## 2020-03-08 DIAGNOSIS — N200 Calculus of kidney: Secondary | ICD-10-CM | POA: Diagnosis not present

## 2020-03-09 DIAGNOSIS — N200 Calculus of kidney: Secondary | ICD-10-CM | POA: Diagnosis not present

## 2020-03-30 DIAGNOSIS — C61 Malignant neoplasm of prostate: Secondary | ICD-10-CM | POA: Diagnosis not present

## 2020-03-30 LAB — PSA: PSA: 5.98

## 2020-04-06 DIAGNOSIS — N401 Enlarged prostate with lower urinary tract symptoms: Secondary | ICD-10-CM | POA: Diagnosis not present

## 2020-04-06 DIAGNOSIS — C61 Malignant neoplasm of prostate: Secondary | ICD-10-CM | POA: Diagnosis not present

## 2020-04-06 DIAGNOSIS — Z87442 Personal history of urinary calculi: Secondary | ICD-10-CM | POA: Diagnosis not present

## 2020-04-06 DIAGNOSIS — R972 Elevated prostate specific antigen [PSA]: Secondary | ICD-10-CM | POA: Diagnosis not present

## 2020-04-06 DIAGNOSIS — R3912 Poor urinary stream: Secondary | ICD-10-CM | POA: Diagnosis not present

## 2020-04-29 DIAGNOSIS — Z794 Long term (current) use of insulin: Secondary | ICD-10-CM | POA: Diagnosis not present

## 2020-04-29 DIAGNOSIS — E1165 Type 2 diabetes mellitus with hyperglycemia: Secondary | ICD-10-CM | POA: Diagnosis not present

## 2020-04-29 DIAGNOSIS — E1022 Type 1 diabetes mellitus with diabetic chronic kidney disease: Secondary | ICD-10-CM | POA: Diagnosis not present

## 2020-04-29 DIAGNOSIS — Z23 Encounter for immunization: Secondary | ICD-10-CM | POA: Diagnosis not present

## 2020-04-29 DIAGNOSIS — E785 Hyperlipidemia, unspecified: Secondary | ICD-10-CM | POA: Diagnosis not present

## 2020-04-29 DIAGNOSIS — Z720 Tobacco use: Secondary | ICD-10-CM | POA: Diagnosis not present

## 2020-04-30 DIAGNOSIS — N529 Male erectile dysfunction, unspecified: Secondary | ICD-10-CM | POA: Diagnosis not present

## 2020-05-01 DIAGNOSIS — H43813 Vitreous degeneration, bilateral: Secondary | ICD-10-CM | POA: Diagnosis not present

## 2020-05-01 DIAGNOSIS — E103393 Type 1 diabetes mellitus with moderate nonproliferative diabetic retinopathy without macular edema, bilateral: Secondary | ICD-10-CM | POA: Diagnosis not present

## 2020-05-01 DIAGNOSIS — H2513 Age-related nuclear cataract, bilateral: Secondary | ICD-10-CM | POA: Diagnosis not present

## 2020-05-01 DIAGNOSIS — H3582 Retinal ischemia: Secondary | ICD-10-CM | POA: Diagnosis not present

## 2020-05-03 ENCOUNTER — Other Ambulatory Visit: Payer: Self-pay | Admitting: Internal Medicine

## 2020-05-03 DIAGNOSIS — T502X5A Adverse effect of carbonic-anhydrase inhibitors, benzothiadiazides and other diuretics, initial encounter: Secondary | ICD-10-CM

## 2020-05-03 DIAGNOSIS — E876 Hypokalemia: Secondary | ICD-10-CM

## 2020-05-03 DIAGNOSIS — I10 Essential (primary) hypertension: Secondary | ICD-10-CM

## 2020-05-03 DIAGNOSIS — E139 Other specified diabetes mellitus without complications: Secondary | ICD-10-CM

## 2020-05-03 MED ORDER — POTASSIUM CHLORIDE CRYS ER 20 MEQ PO TBCR: 20.0000 meq | EXTENDED_RELEASE_TABLET | Freq: Two times a day (BID) | ORAL | 0 refills | Status: DC

## 2020-05-03 MED ORDER — INDAPAMIDE 1.25 MG PO TABS: 1.2500 mg | ORAL_TABLET | Freq: Every day | ORAL | 0 refills | Status: DC

## 2020-05-03 MED ORDER — GVOKE HYPOPEN 2-PACK 1 MG/0.2ML ~~LOC~~ SOAJ: 1.0000 | Freq: Every day | SUBCUTANEOUS | 5 refills | Status: DC | PRN

## 2020-05-03 MED ORDER — IRBESARTAN 300 MG PO TABS: 300.0000 mg | ORAL_TABLET | Freq: Every day | ORAL | 0 refills | Status: DC

## 2020-05-05 ENCOUNTER — Other Ambulatory Visit: Payer: Self-pay | Admitting: Internal Medicine

## 2020-05-05 ENCOUNTER — Encounter: Payer: Self-pay | Admitting: Internal Medicine

## 2020-05-05 DIAGNOSIS — B354 Tinea corporis: Secondary | ICD-10-CM

## 2020-05-05 DIAGNOSIS — I10 Essential (primary) hypertension: Secondary | ICD-10-CM

## 2020-05-05 DIAGNOSIS — E139 Other specified diabetes mellitus without complications: Secondary | ICD-10-CM

## 2020-05-05 MED ORDER — EDARBI 40 MG PO TABS: 1.0000 | ORAL_TABLET | Freq: Every day | ORAL | 1 refills | Status: DC

## 2020-05-07 ENCOUNTER — Other Ambulatory Visit: Payer: Self-pay | Admitting: Internal Medicine

## 2020-05-07 DIAGNOSIS — E139 Other specified diabetes mellitus without complications: Secondary | ICD-10-CM

## 2020-05-07 DIAGNOSIS — I10 Essential (primary) hypertension: Secondary | ICD-10-CM

## 2020-05-07 MED ORDER — CANDESARTAN CILEXETIL 16 MG PO TABS: 16.0000 mg | ORAL_TABLET | Freq: Every day | ORAL | 1 refills | Status: DC

## 2020-05-13 DIAGNOSIS — M1030 Gout due to renal impairment, unspecified site: Secondary | ICD-10-CM | POA: Insufficient documentation

## 2020-05-13 DIAGNOSIS — N200 Calculus of kidney: Secondary | ICD-10-CM | POA: Diagnosis not present

## 2020-05-13 DIAGNOSIS — R82993 Hyperuricosuria: Secondary | ICD-10-CM | POA: Diagnosis not present

## 2020-05-13 DIAGNOSIS — Z87442 Personal history of urinary calculi: Secondary | ICD-10-CM | POA: Diagnosis not present

## 2020-05-13 DIAGNOSIS — R82992 Hyperoxaluria: Secondary | ICD-10-CM | POA: Diagnosis not present

## 2020-05-13 DIAGNOSIS — R82991 Hypocitraturia: Secondary | ICD-10-CM | POA: Diagnosis not present

## 2020-05-13 DIAGNOSIS — N289 Disorder of kidney and ureter, unspecified: Secondary | ICD-10-CM | POA: Diagnosis not present

## 2020-05-13 DIAGNOSIS — Z79899 Other long term (current) drug therapy: Secondary | ICD-10-CM | POA: Diagnosis not present

## 2020-05-13 DIAGNOSIS — N209 Urinary calculus, unspecified: Secondary | ICD-10-CM | POA: Diagnosis not present

## 2020-05-24 ENCOUNTER — Other Ambulatory Visit: Payer: Self-pay | Admitting: Internal Medicine

## 2020-05-24 DIAGNOSIS — E785 Hyperlipidemia, unspecified: Secondary | ICD-10-CM

## 2020-06-28 IMAGING — CT CT RENAL STONE PROTOCOL
2 series · 13 of 42 positions shown, 14 images · non-contrast
Comparison: 11/27/2019

CLINICAL DATA: Right flank pain, dysuria. History of
nephrolithotomy 11/26/2019

EXAM:
CT ABDOMEN AND PELVIS WITHOUT CONTRAST
TECHNIQUE: Multidetector CT imaging of the abdomen and pelvis was performed
following the standard protocol without IV contrast.

[Series 4: coronal · coronal · 0.80mm/px · 12 of 144 slices shown, 13 images]
[im 12/144  lung]
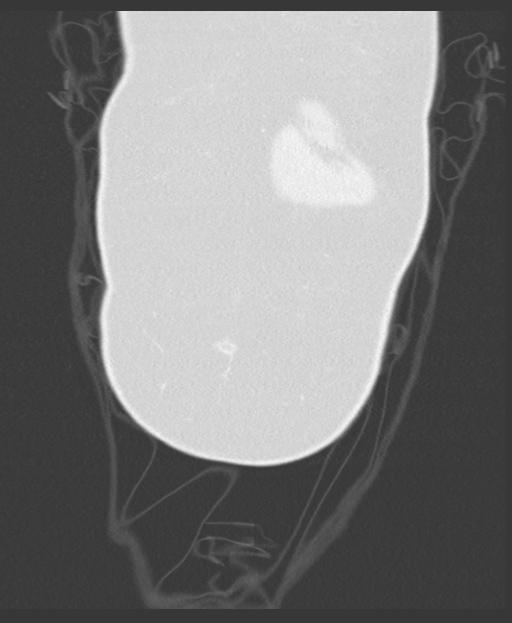
[im 16/144  soft-tissue]
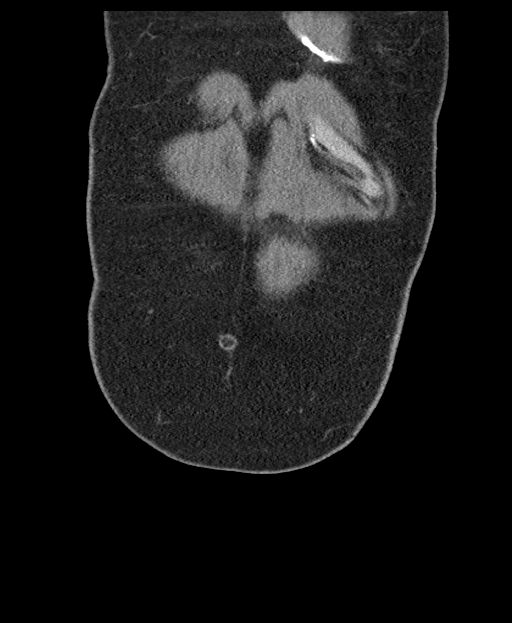
[im 16/144  bone]
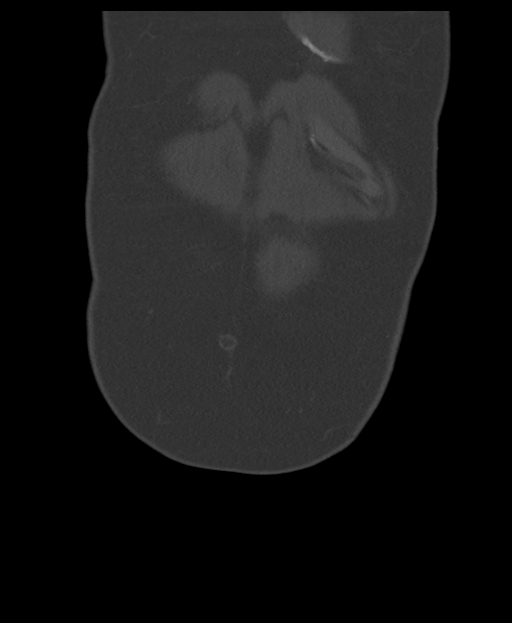
[im 23/144  lung]
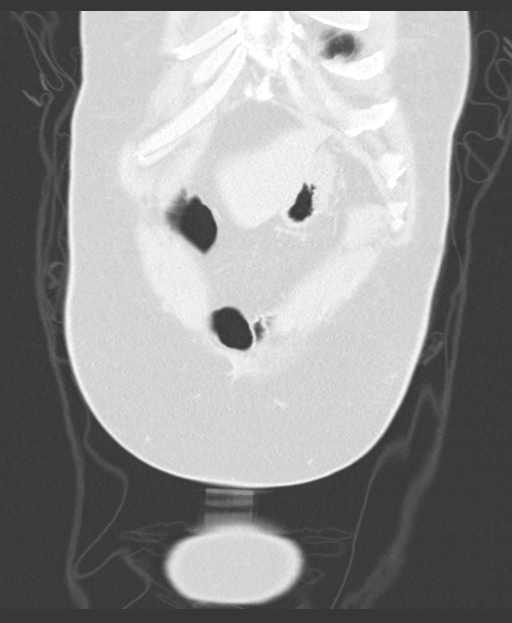
[im 32/144  soft-tissue]
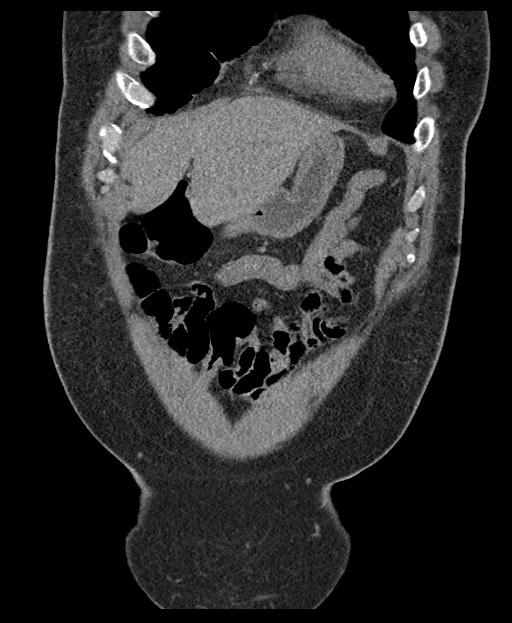
[im 34/144  lung]
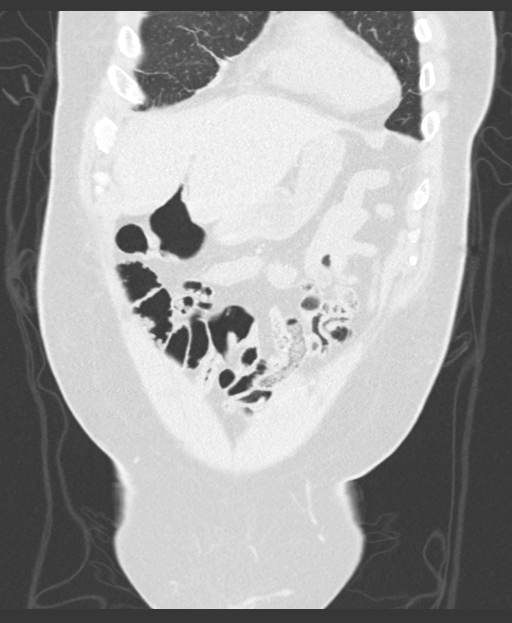
[im 45/144  lung]
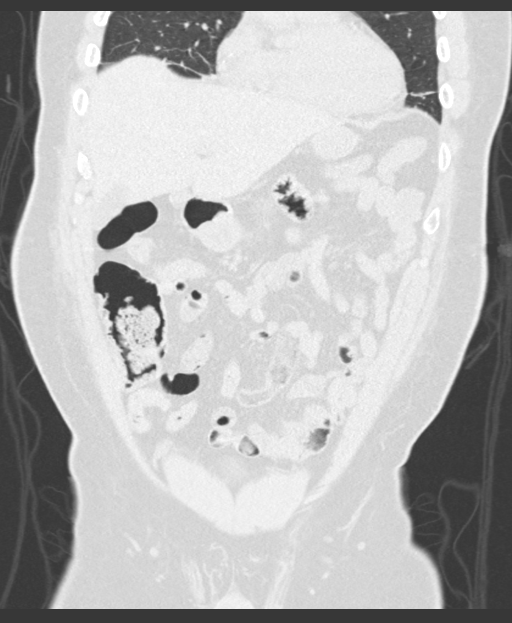
[im 48/144  soft-tissue]
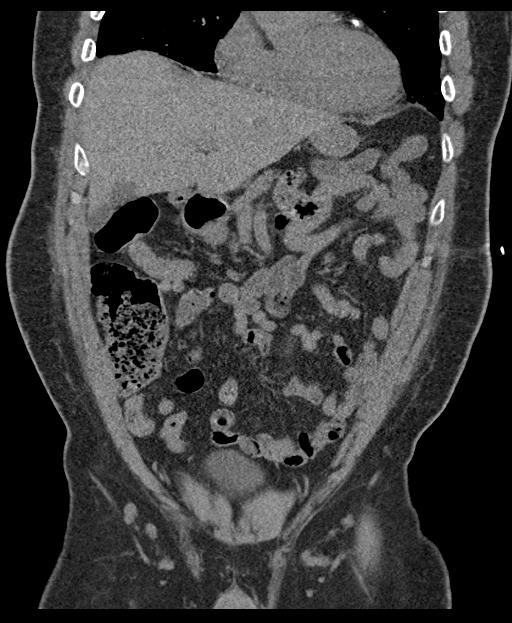
[im 64/144  soft-tissue]
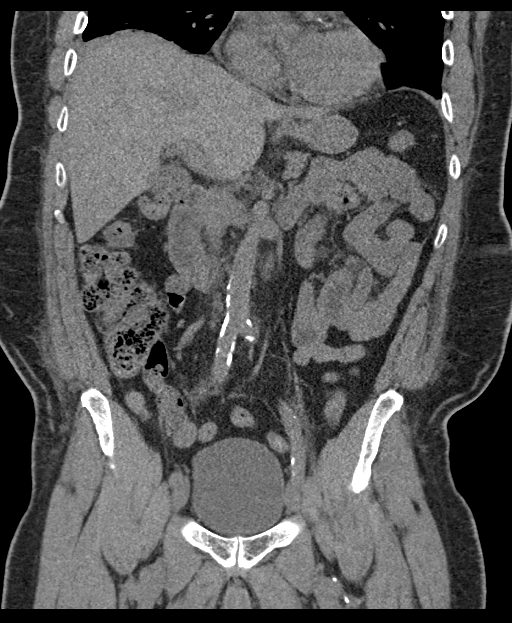
[im 80/144  soft-tissue]
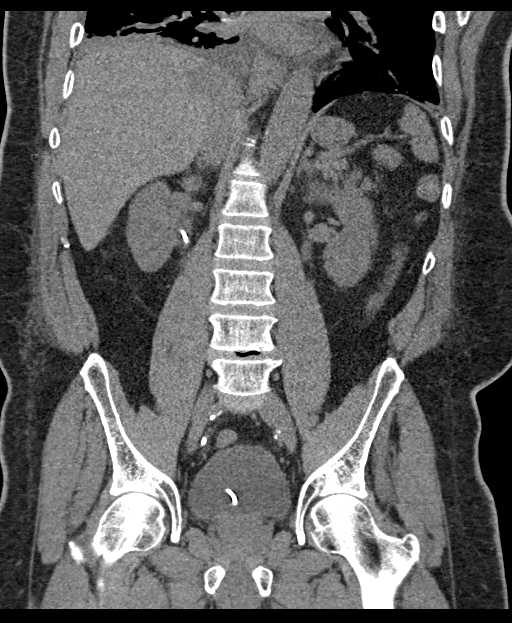
[im 96/144  soft-tissue]
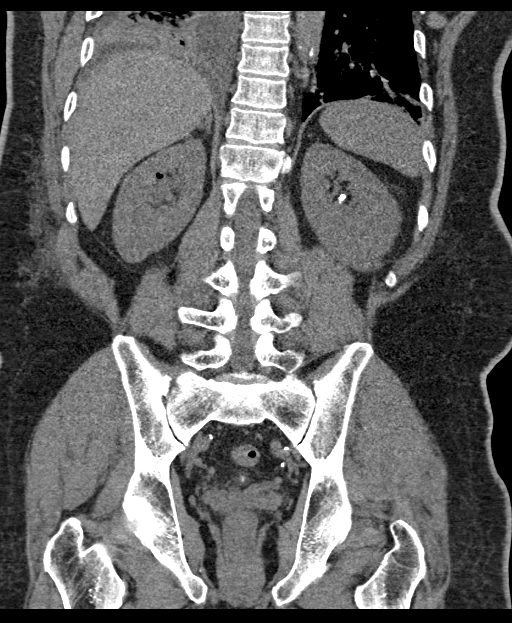
[im 112/144  soft-tissue]
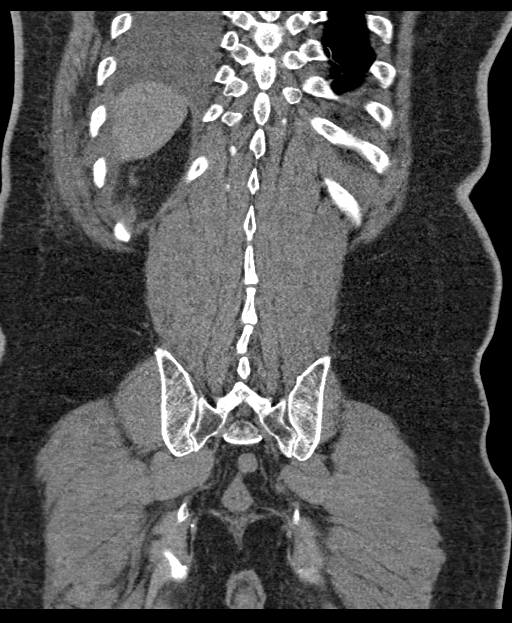
[im 128/144  soft-tissue]
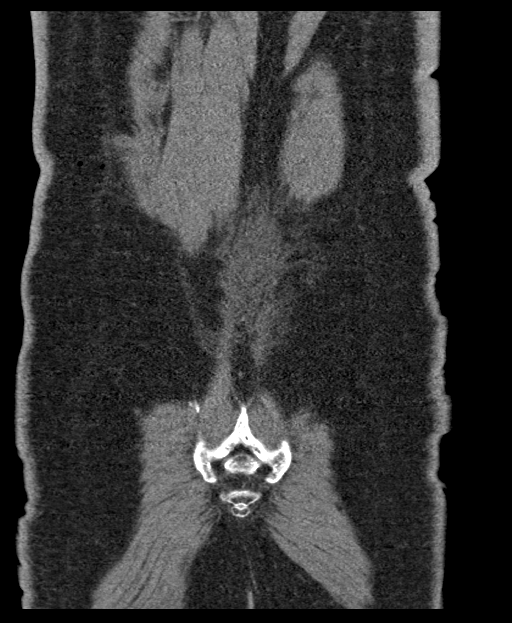

[Series 5: sagittal · sagittal · 0.58mm/px · 1 of 181 slices shown]
[im 85/181  soft-tissue]
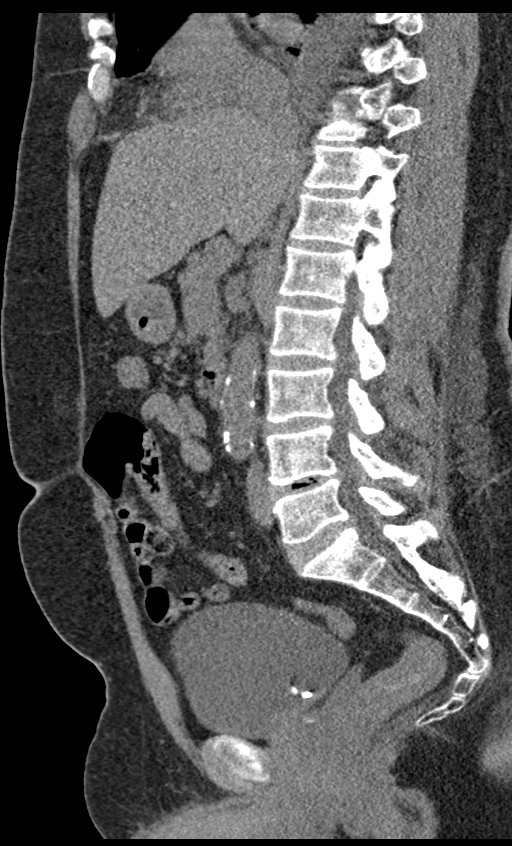

[13 of 42 positions shown; findings below may reference images not displayed]

FINDINGS: Lower chest: Small right pleural effusion with dependent right
basilar airspace consolidation. Mild left basilar atelectasis.
Coronary artery calcification.

Hepatobiliary: No focal liver abnormality is seen. No gallstones,
gallbladder wall thickening, or biliary dilatation.

Pancreas: Unremarkable. No pancreatic ductal dilatation or
surrounding inflammatory changes.

Spleen: Normal in size without focal abnormality.

Adrenals/Urinary Tract: Unremarkable adrenal glands. Interval
removal of the previously seen right percutaneous nephroureteral
stents with interval placement of a well-positioned double-J
right-sided nephroureteral stent. Small amount of residual gas
within the right renal collecting system, decreased from prior.
There are a few small locules of residual gas within the right
perirenal space (series 2, images 40-41), decreased from prior. The
previously seen large right renal calculus is no longer evident.
There is a tiny 1 mm punctate residual stone within the inferior
calyx of the right kidney. Multiple nonobstructing left-sided renal
stones, the 2 largest measuring 8 and 7 mm. Left ureter is
unremarkable. There is a 5 mm stone located within the dependent
portion of the urinary bladder. Bladder appears otherwise
unremarkable.

Stomach/Bowel: Stomach is within normal limits. Appendix is
surgically absent. No evidence of bowel wall thickening, distention,
or inflammatory changes.

Vascular/Lymphatic: Aortoiliac atherosclerosis without aneurysm. No
abdominopelvic lymphadenopathy.

Reproductive: Mildly prominent prostate gland with coarse
calcification.

Other: No free fluid within the abdomen or pelvis. No abdominal wall
hernia.

Musculoskeletal: No acute or significant osseous findings.
IMPRESSION: 1. Interval placement of a well-positioned double-J right-sided
nephroureteral stent without evidence of complication
2. The previously seen large right renal calculus is no longer
evident. There is a tiny 1 mm residual stone within the inferior
calyx of the right kidney.
3. Multiple nonobstructing left-sided renal stones, the 2 largest
measuring 8 mm and 7 mm.
4. There is a 5 mm stone located within the dependent portion of the
urinary bladder.
5. Small right pleural effusion with dependent right basilar
airspace consolidation, which may represent atelectasis or
pneumonia.
6. Aortic Atherosclerosis ([id number]).

## 2020-07-26 ENCOUNTER — Other Ambulatory Visit: Payer: Self-pay | Admitting: Internal Medicine

## 2020-07-26 DIAGNOSIS — E876 Hypokalemia: Secondary | ICD-10-CM

## 2020-07-26 DIAGNOSIS — T502X5A Adverse effect of carbonic-anhydrase inhibitors, benzothiadiazides and other diuretics, initial encounter: Secondary | ICD-10-CM

## 2020-07-26 DIAGNOSIS — I10 Essential (primary) hypertension: Secondary | ICD-10-CM

## 2020-07-28 ENCOUNTER — Encounter: Payer: Self-pay | Admitting: Internal Medicine

## 2020-07-28 ENCOUNTER — Other Ambulatory Visit: Payer: Self-pay

## 2020-07-28 ENCOUNTER — Other Ambulatory Visit: Payer: Self-pay | Admitting: Internal Medicine

## 2020-07-28 ENCOUNTER — Ambulatory Visit (INDEPENDENT_AMBULATORY_CARE_PROVIDER_SITE_OTHER): Payer: Medicare Other | Admitting: Internal Medicine

## 2020-07-28 VITALS — BP 136/76 | HR 77 | Temp 98.3°F | Resp 16 | Ht 73.0 in | Wt 244.4 lb

## 2020-07-28 DIAGNOSIS — C61 Malignant neoplasm of prostate: Secondary | ICD-10-CM

## 2020-07-28 DIAGNOSIS — E876 Hypokalemia: Secondary | ICD-10-CM

## 2020-07-28 DIAGNOSIS — I10 Essential (primary) hypertension: Secondary | ICD-10-CM | POA: Diagnosis not present

## 2020-07-28 DIAGNOSIS — E139 Other specified diabetes mellitus without complications: Secondary | ICD-10-CM

## 2020-07-28 DIAGNOSIS — Z23 Encounter for immunization: Secondary | ICD-10-CM | POA: Diagnosis not present

## 2020-07-28 DIAGNOSIS — E785 Hyperlipidemia, unspecified: Secondary | ICD-10-CM

## 2020-07-28 DIAGNOSIS — T502X5A Adverse effect of carbonic-anhydrase inhibitors, benzothiadiazides and other diuretics, initial encounter: Secondary | ICD-10-CM | POA: Diagnosis not present

## 2020-07-28 LAB — MICROALBUMIN / CREATININE URINE RATIO
Creatinine,U: 148.8 mg/dL
Microalb Creat Ratio: 1.1 mg/g (ref 0.0–30.0)
Microalb, Ur: 1.6 mg/dL (ref 0.0–1.9)

## 2020-07-28 LAB — CBC WITH DIFFERENTIAL/PLATELET
Basophils Absolute: 0.1 10*3/uL (ref 0.0–0.1)
Basophils Relative: 0.8 % (ref 0.0–3.0)
Eosinophils Absolute: 0.5 10*3/uL (ref 0.0–0.7)
Eosinophils Relative: 4.7 % (ref 0.0–5.0)
HCT: 43.8 % (ref 39.0–52.0)
Hemoglobin: 14.7 g/dL (ref 13.0–17.0)
Lymphocytes Relative: 35.1 % (ref 12.0–46.0)
Lymphs Abs: 3.6 10*3/uL (ref 0.7–4.0)
MCHC: 33.6 g/dL (ref 30.0–36.0)
MCV: 90 fl (ref 78.0–100.0)
Monocytes Absolute: 0.7 10*3/uL (ref 0.1–1.0)
Monocytes Relative: 7.2 % (ref 3.0–12.0)
Neutro Abs: 5.3 10*3/uL (ref 1.4–7.7)
Neutrophils Relative %: 52.2 % (ref 43.0–77.0)
Platelets: 179 10*3/uL (ref 150.0–400.0)
RBC: 4.86 Mil/uL (ref 4.22–5.81)
RDW: 14.7 % (ref 11.5–15.5)
WBC: 10.2 10*3/uL (ref 4.0–10.5)

## 2020-07-28 LAB — TSH: TSH: 1.4 u[IU]/mL (ref 0.35–4.50)

## 2020-07-28 LAB — HEPATIC FUNCTION PANEL
ALT: 18 U/L (ref 0–53)
AST: 16 U/L (ref 0–37)
Albumin: 4.3 g/dL (ref 3.5–5.2)
Alkaline Phosphatase: 97 U/L (ref 39–117)
Bilirubin, Direct: 0.1 mg/dL (ref 0.0–0.3)
Total Bilirubin: 0.6 mg/dL (ref 0.2–1.2)
Total Protein: 7.1 g/dL (ref 6.0–8.3)

## 2020-07-28 LAB — LIPID PANEL
Cholesterol: 108 mg/dL (ref 0–200)
HDL: 42.3 mg/dL (ref >39.00)
LDL Cholesterol: 49 mg/dL (ref 0–99)
NonHDL: 65.84
Total CHOL/HDL Ratio: 3
Triglycerides: 86 mg/dL (ref 0.0–149.0)
VLDL: 17.2 mg/dL (ref 0.0–40.0)

## 2020-07-28 LAB — BASIC METABOLIC PANEL
BUN: 17 mg/dL (ref 6–23)
CO2: 29 mEq/L (ref 19–32)
Calcium: 9.4 mg/dL (ref 8.4–10.5)
Chloride: 101 mEq/L (ref 96–112)
Creatinine, Ser: 1.09 mg/dL (ref 0.40–1.50)
GFR: 70.04 mL/min (ref >60.00)
Glucose, Bld: 87 mg/dL (ref 70–99)
Potassium: 3.7 mEq/L (ref 3.5–5.1)
Sodium: 138 mEq/L (ref 135–145)

## 2020-07-28 LAB — CK: Total CK: 368 U/L — ABNORMAL HIGH (ref 7–232)

## 2020-07-28 LAB — HEMOGLOBIN A1C: Hgb A1c MFr Bld: 7.7 % — ABNORMAL HIGH (ref 4.6–6.5)

## 2020-07-28 LAB — PSA: PSA: 7.64 ng/mL — ABNORMAL HIGH (ref 0.10–4.00)

## 2020-07-28 LAB — MAGNESIUM: Magnesium: 1.9 mg/dL (ref 1.5–2.5)

## 2020-07-28 NOTE — Patient Instructions (Signed)

## 2020-07-28 NOTE — Progress Notes (Signed)
Subjective:  [name]., male    DOB: 05/19/1951  Age: 70 y.o. MRN: [id number]  CC: Hypertension, Hyperlipidemia, and Diabetes  This visit occurred during the SARS-CoV-2 public health emergency.  Safety protocols were in place, including screening questions prior to the visit, additional usage of staff PPE, and extensive cleaning of exam room while observing appropriate contact time as indicated for disinfecting solutions.    HPI Anthony Daniel. presents for f/up - He complains of a several month hx of muscle aches.  Outpatient Medications Prior to Visit  Medication Sig Dispense Refill  . aspirin EC 81 MG tablet Take 81 mg by mouth daily.    . candesartan (ATACAND) 16 MG tablet Take 1 tablet (16 mg total) by mouth daily. 90 tablet 1  . Cholecalciferol (VITAMIN D) 2000 units tablet Take 2,000 Units by mouth daily.    . Ciclopirox 0.77 % gel APPLY TO AFFECTED AREA TWICE A DAY 30 g 2  . Continuous Blood Gluc Receiver ([place]) DEVI See admin instructions.    . Continuous Blood Gluc Sensor (DEXCOM G6 SENSOR) MISC change sensor    . Continuous Blood Gluc Transmit (DEXCOM G6 TRANSMITTER) MISC See admin instructions.    Marland Kitchen ezetimibe (ZETIA) 10 MG tablet TAKE 1 TABLET EVERY DAY 90 tablet 1  . fluticasone (FLONASE) 50 MCG/ACT nasal spray Place 2 sprays into both nostrils daily. (Patient taking differently: Place 2 sprays into both nostrils daily as needed for allergies.) 16 g 3  . Glucagon (GVOKE HYPOPEN 2-PACK) 1 MG/0.2ML SOAJ Inject 1 Act into the skin daily as needed. 2 mL 5  . glucose blood (ONETOUCH VERIO) test strip 5X A DAY    . indapamide (LOZOL) 1.25 MG tablet Take 1 tablet (1.25 mg total) by mouth daily. 90 tablet 0  . Insulin Glargine (LANTUS SOLOSTAR Maria Antonia) Inject 15 Units into the skin 2 (two) times daily as needed. Only uses when pump goes out    . Insulin Human (INSULIN PUMP) SOLN Inject 1 each into the skin continuous. Novolog insulin    . levocetirizine  (XYZAL) 5 MG tablet TAKE 1 TABLET (5 MG TOTAL) BY MOUTH DAILY AS NEEDED FOR ALLERGIES. 90 tablet 1  . NOVOLOG 100 UNIT/ML injection Inject 60 Units into the skin See admin instructions. With pump. Through out the day    . Polyethyl Glycol-Propyl Glycol (SYSTANE OP) Place 1 drop into both eyes every 8 (eight) hours as needed (dry eyes).    . sildenafil (REVATIO) 20 MG tablet Take 20 mg by mouth daily as needed (erectile dysfunction).    . metaxalone (SKELAXIN) 400 MG tablet TAKE 1 TABLET (400 MG TOTAL) BY MOUTH 3 (THREE) TIMES DAILY AS NEEDED (PAIN). 270 tablet 0  . Pitavastatin Calcium (LIVALO) 2 MG TABS Take 1 tablet (2 mg total) by mouth daily. 90 tablet 1  . potassium chloride SA (KLOR-CON) 20 MEQ tablet Take 1 tablet (20 mEq total) by mouth 2 (two) times daily. 180 tablet 0  . senna-docusate (SENOKOT-S) 8.6-50 MG tablet Take 1 tablet by mouth 2 (two) times daily. While taking strong pain meds to prevent constipation 10 tablet 0  . tamsulosin (FLOMAX) 0.4 MG CAPS capsule Take 1 capsule (0.4 mg total) by mouth daily. (Patient taking differently: Take 0.4 mg by mouth every morning.) 30 capsule 0   No facility-administered medications prior to visit.    ROS Review of Systems  Constitutional: Negative for chills, diaphoresis, fatigue and fever.  HENT: Negative.  Eyes: Negative for visual disturbance.  Respiratory: Negative for cough, chest tightness, shortness of breath and wheezing.   Cardiovascular: Negative for chest pain, palpitations and leg swelling.  Gastrointestinal: Negative for abdominal pain, constipation, diarrhea, nausea and vomiting.  Endocrine: Negative.  Negative for polydipsia, polyphagia and polyuria.  Genitourinary: Negative.  Negative for difficulty urinating and hematuria.  Musculoskeletal: Positive for myalgias. Negative for arthralgias, back pain and neck pain.  Skin: Negative.  Negative for color change and pallor.  Neurological: Negative.  Negative for dizziness and  weakness.  Hematological: Negative for adenopathy. Does not bruise/bleed easily.  Psychiatric/Behavioral: Negative.     Objective:  BP 136/76 (BP Location: Left Arm, Patient Position: Sitting, Cuff Size: Large)   Pulse 77   Temp 98.3 F (36.8 C) (Oral)   Resp 16   Ht 6\' 1"  (1.854 m)   Wt 244 lb 6.4 oz (110.9 kg)   SpO2 97%   BMI 32.24 kg/m   BP Readings from Last 3 Encounters:  07/28/20 136/76  01/13/20 116/62  12/03/19 138/70    Wt Readings from Last 3 Encounters:  07/28/20 244 lb 6.4 oz (110.9 kg)  01/13/20 247 lb (112 kg)  11/28/19 255 lb 1.2 oz (115.7 kg)    Physical Exam Vitals reviewed.  HENT:     Nose: Nose normal.     Mouth/Throat:     Mouth: Mucous membranes are moist.  Eyes:     General: No scleral icterus.    Conjunctiva/sclera: Conjunctivae normal.  Cardiovascular:     Rate and Rhythm: Normal rate and regular rhythm.     Pulses:          Carotid pulses are 1+ on the right side and 1+ on the left side.      Radial pulses are 1+ on the right side and 1+ on the left side.       Femoral pulses are 1+ on the right side and 1+ on the left side.      Popliteal pulses are 1+ on the right side and 1+ on the left side.       Dorsalis pedis pulses are 1+ on the right side and 1+ on the left side.       Posterior tibial pulses are 1+ on the right side and 1+ on the left side.     Heart sounds: No murmur heard.   Pulmonary:     Effort: Pulmonary effort is normal.     Breath sounds: No stridor. No wheezing, rhonchi or rales.  Abdominal:     General: Abdomen is flat.     Palpations: There is no mass.     Tenderness: There is no abdominal tenderness. There is no guarding.  Musculoskeletal:        General: Normal range of motion.     Cervical back: Neck supple.     Right lower leg: No edema.     Left lower leg: No edema.  Lymphadenopathy:     Cervical: No cervical adenopathy.  Skin:    General: Skin is warm and dry.     Coloration: Skin is not pale.   Neurological:     General: No focal deficit present.     Mental Status: He is alert and oriented to person, place, and time.     Lab Results  Component Value Date   WBC 10.2 07/28/2020   HGB 14.7 07/28/2020   HCT 43.8 07/28/2020   PLT 179.0 07/28/2020   GLUCOSE 87 07/28/2020  CHOL 108 07/28/2020   TRIG 86.0 07/28/2020   HDL 42.30 07/28/2020   LDLCALC 49 07/28/2020   ALT 18 07/28/2020   AST 16 07/28/2020   NA 138 07/28/2020   K 3.7 07/28/2020   CL 101 07/28/2020   CREATININE 1.09 07/28/2020   BUN 17 07/28/2020   CO2 29 07/28/2020   TSH 1.40 07/28/2020   PSA 7.64 (H) 07/28/2020   INR 1.0 11/25/2019   HGBA1C 7.7 (H) 07/28/2020   MICROALBUR 1.6 07/28/2020    CT Renal Stone Study  Result Date: 12/03/2019 CLINICAL DATA:  Right flank pain, dysuria. History of nephrolithotomy 11/26/2019 EXAM: CT ABDOMEN AND PELVIS WITHOUT CONTRAST TECHNIQUE: Multidetector CT imaging of the abdomen and pelvis was performed following the standard protocol without IV contrast. COMPARISON:  11/27/2019 FINDINGS: Lower chest: Small right pleural effusion with dependent right basilar airspace consolidation. Mild left basilar atelectasis. Coronary artery calcification. Hepatobiliary: No focal liver abnormality is seen. No gallstones, gallbladder wall thickening, or biliary dilatation. Pancreas: Unremarkable. No pancreatic ductal dilatation or surrounding inflammatory changes. Spleen: Normal in size without focal abnormality. Adrenals/Urinary Tract: Unremarkable adrenal glands. Interval removal of the previously seen right percutaneous nephroureteral stents with interval placement of a well-positioned double-J right-sided nephroureteral stent. Small amount of residual gas within the right renal collecting system, decreased from prior. There are a few small locules of residual gas within the right perirenal space (series 2, images 40-41), decreased from prior. The previously seen large right renal calculus is no  longer evident. There is a tiny 1 mm punctate residual stone within the inferior calyx of the right kidney. Multiple nonobstructing left-sided renal stones, the 2 largest measuring 8 and 7 mm. Left ureter is unremarkable. There is a 5 mm stone located within the dependent portion of the urinary bladder. Bladder appears otherwise unremarkable. Stomach/Bowel: Stomach is within normal limits. Appendix is surgically absent. No evidence of bowel wall thickening, distention, or inflammatory changes. Vascular/Lymphatic: Aortoiliac atherosclerosis without aneurysm. No abdominopelvic lymphadenopathy. Reproductive: Mildly prominent prostate gland with coarse calcification. Other: No free fluid within the abdomen or pelvis. No abdominal wall hernia. Musculoskeletal: No acute or significant osseous findings. IMPRESSION: 1. Interval placement of a well-positioned double-J right-sided nephroureteral stent without evidence of complication 2. The previously seen large right renal calculus is no longer evident. There is a tiny 1 mm residual stone within the inferior calyx of the right kidney. 3. Multiple nonobstructing left-sided renal stones, the 2 largest measuring 8 mm and 7 mm. 4. There is a 5 mm stone located within the dependent portion of the urinary bladder. 5. Small right pleural effusion with dependent right basilar airspace consolidation, which may represent atelectasis or pneumonia. 6. Aortic Atherosclerosis (ICD10-I70.0). Electronically Signed   By: Davina Poke D.O.   On: 12/03/2019 08:12    Assessment & Plan:   Glyn was seen today for hypertension, hyperlipidemia and diabetes.  Diagnoses and all orders for this visit:  Essential hypertension- His blood pressure is adequately well controlled.  Electrolytes and renal function are normal. -     CBC with Differential/Platelet; Future -     Basic metabolic panel; Future -     TSH; Future -     ABO; Future -     ABO -     TSH -     Basic metabolic  panel -     CBC with Differential/Platelet  LADA (latent autoimmune diabetes in adults), managed as type 1 ([place])- His A1c is at 7.7%.  His blood  sugars are adequately well controlled. -     Basic metabolic panel; Future -     Microalbumin / creatinine urine ratio; Future -     Hemoglobin A1c; Future -     ABO; Future -     HM Diabetes Foot Exam -     ABO -     Hemoglobin A1c -     Microalbumin / creatinine urine ratio -     Basic metabolic panel  Diuretic-induced hypokalemia- His potassium level is normal now.  He is taking potassium citrate provided to him by a doctor treating his kidney stones. -     Basic metabolic panel; Future -     Magnesium; Future -     ABO; Future -     ABO -     Magnesium -     Basic metabolic panel  Hyperlipidemia with target LDL less than 100- He complains of myalgias and has an elevated CPK level.  I recommended that he stop taking the statin. -     Lipid panel; Future -     Hepatic function panel; Future -     TSH; Future -     CK; Future -     ABO; Future -     ABO -     CK -     TSH -     Hepatic function panel -     Lipid panel  Flu vaccine need -     Flu Vaccine QUAD High Dose(Fluad) -     ABO; Future -     ABO  Prostate cancer ([place])- His PSA has risen slightly.  He will continue to follow-up with his urologist. -     ABO; Future -     PSA; Future -     PSA -     ABO  Other orders -     Pneumococcal polysaccharide vaccine 23-valent greater than or equal to 2yo subcutaneous/IM   I have discontinued Anthony Daniel. "Tommy"'s tamsulosin, metaxalone, senna-docusate, potassium chloride SA, and Livalo. I am also having him maintain his Insulin Glargine (LANTUS SOLOSTAR [place]), aspirin EC, Polyethyl Glycol-Propyl Glycol (SYSTANE OP), insulin pump, sildenafil, Vitamin D, NovoLOG, fluticasone, levocetirizine, Gvoke HypoPen 2-Pack, indapamide, Ciclopirox, candesartan, ezetimibe, OneTouch Verio, Dexcom G6 Receiver, Dexcom G6 Sensor, and Armed forces logistics/support/administrative officer.  No orders of the defined types were placed in this encounter.    Follow-up: Return in about 3 months (around 10/26/2020).  Scarlette Calico, MD

## 2020-07-29 LAB — EXTRA SPECIMEN

## 2020-07-29 LAB — ABO

## 2020-07-30 DIAGNOSIS — R7309 Other abnormal glucose: Secondary | ICD-10-CM | POA: Diagnosis not present

## 2020-07-30 DIAGNOSIS — E1022 Type 1 diabetes mellitus with diabetic chronic kidney disease: Secondary | ICD-10-CM | POA: Diagnosis not present

## 2020-07-30 DIAGNOSIS — Z23 Encounter for immunization: Secondary | ICD-10-CM | POA: Diagnosis not present

## 2020-07-30 DIAGNOSIS — Z794 Long term (current) use of insulin: Secondary | ICD-10-CM | POA: Diagnosis not present

## 2020-07-30 DIAGNOSIS — Z720 Tobacco use: Secondary | ICD-10-CM | POA: Diagnosis not present

## 2020-07-30 DIAGNOSIS — E785 Hyperlipidemia, unspecified: Secondary | ICD-10-CM | POA: Diagnosis not present

## 2020-08-04 ENCOUNTER — Other Ambulatory Visit: Payer: Self-pay | Admitting: Internal Medicine

## 2020-08-04 DIAGNOSIS — J301 Allergic rhinitis due to pollen: Secondary | ICD-10-CM

## 2020-08-06 ENCOUNTER — Ambulatory Visit: Payer: Medicare Other

## 2020-08-12 DIAGNOSIS — Z20822 Contact with and (suspected) exposure to covid-19: Secondary | ICD-10-CM | POA: Diagnosis not present

## 2020-08-17 ENCOUNTER — Ambulatory Visit (INDEPENDENT_AMBULATORY_CARE_PROVIDER_SITE_OTHER): Payer: Medicare Other

## 2020-08-17 DIAGNOSIS — Z0000 Encounter for general adult medical examination without abnormal findings: Secondary | ICD-10-CM

## 2020-08-17 NOTE — Progress Notes (Signed)
I connected with Merrilee Seashore today by telephone and verified that I am speaking with the correct person using two identifiers. Location patient: home Location provider: work Persons participating in the virtual visit: Breckon Houser and Lisette Abu, LPN.   I discussed the limitations, risks, security and privacy concerns of performing an evaluation and management service by telephone and the availability of in person appointments. I also discussed with the patient that there may be a patient responsible charge related to this service. The patient expressed understanding and verbally consented to this telephonic visit.    Interactive audio and video telecommunications were attempted between this provider and patient, however failed, due to patient having technical difficulties OR patient did not have access to video capability.  We continued and completed visit with audio only.  Some vital signs may be absent or patient reported.   Time Spent with patient on telephone encounter: 30 minutes  Subjective:   Elworth Laubenstein. is a 70 y.o. male who presents for Medicare Annual/Subsequent preventive examination.  Review of Systems    No ROS. Medicare Wellness Visit. Additional risk factors are reflected in social history. Cardiac Risk Factors include: advanced age (>10men, >30 women);dyslipidemia;family history of premature cardiovascular disease;hypertension;male gender;obesity (BMI >30kg/m2);smoking/ tobacco exposure     Objective:    There were no vitals filed for this visit. There is no height or weight on file to calculate BMI.  Advanced Directives 08/17/2020 12/03/2019 11/26/2019 11/25/2019 11/20/2019 10/24/2019 01/22/2019  Does Patient Have a Medical Advance Directive? No No No No No No No  Would patient like information on creating a medical advance directive? No - Patient declined No - Patient declined No - Patient declined No - Patient declined - No - Patient declined No - Patient  declined    Current Medications (verified) Outpatient Encounter Medications as of 08/17/2020  Medication Sig  . aspirin EC 81 MG tablet Take 81 mg by mouth daily.  . candesartan (ATACAND) 16 MG tablet Take 1 tablet (16 mg total) by mouth daily.  . Cholecalciferol (VITAMIN D) 2000 units tablet Take 2,000 Units by mouth daily.  . Ciclopirox 0.77 % gel APPLY TO AFFECTED AREA TWICE A DAY  . Continuous Blood Gluc Receiver ([place]) DEVI See admin instructions.  . Continuous Blood Gluc Sensor (DEXCOM G6 SENSOR) MISC change sensor  . Continuous Blood Gluc Transmit (DEXCOM G6 TRANSMITTER) MISC See admin instructions.  Marland Kitchen ezetimibe (ZETIA) 10 MG tablet TAKE 1 TABLET EVERY DAY  . fluticasone (FLONASE) 50 MCG/ACT nasal spray Place 2 sprays into both nostrils daily. (Patient taking differently: Place 2 sprays into both nostrils daily as needed for allergies.)  . Glucagon (GVOKE HYPOPEN 2-PACK) 1 MG/0.2ML SOAJ Inject 1 Act into the skin daily as needed.  Marland Kitchen glucose blood (ONETOUCH VERIO) test strip 5X A DAY  . indapamide (LOZOL) 1.25 MG tablet Take 1 tablet (1.25 mg total) by mouth daily.  . Insulin Glargine (LANTUS SOLOSTAR [place]) Inject 15 Units into the skin 2 (two) times daily as needed. Only uses when pump goes out  . Insulin Human (INSULIN PUMP) SOLN Inject 1 each into the skin continuous. Novolog insulin  . levocetirizine (XYZAL) 5 MG tablet TAKE 1 TABLET (5 MG TOTAL) BY MOUTH DAILY AS NEEDED FOR ALLERGIES.  Marland Kitchen NOVOLOG 100 UNIT/ML injection Inject 60 Units into the skin See admin instructions. With pump. Through out the day  . Polyethyl Glycol-Propyl Glycol (SYSTANE OP) Place 1 drop into both eyes every 8 (eight)  hours as needed (dry eyes).  . sildenafil (REVATIO) 20 MG tablet Take 20 mg by mouth daily as needed (erectile dysfunction).   No facility-administered encounter medications on file as of 08/17/2020.    Allergies (verified) Altace [ramipril], Crestor [rosuvastatin], Lipitor  [atorvastatin], Contrast media [iodinated diagnostic agents], Dilaudid [hydromorphone], Iodine, Latex, Oxycodone, Adhesive [tape], and Peanut-containing drug products   History: Past Medical History:  Diagnosis Date  . Arthritis    Back  . Cancer ([place])    bladder  . CKD (chronic kidney disease), stage I   . History of bladder cancer    1998--  TCC  . History of kidney stones   . Hyperlipidemia   . Hypertension   . Insulin pump in place   . LADA (latent autoimmune diabetes in adults), managed as type 1 [facility])    endocrinologist-- dr Buddy Duty  . Nephrolithiasis    bilateral --  nonobstructive per CT  . PONV (postoperative nausea and vomiting)   . Spinal stenosis, cervical region    Past Surgical History:  Procedure Laterality Date  . COLONOSCOPY  2018   x2  . CYSTOSCOPY W/ URETERAL STENT PLACEMENT Right 05/12/2015   Procedure: CYSTOSCOPY WITH STENT REPLACEMENT;  Surgeon: Cleon Gustin, MD;  [facility]  Service: Urology;  Laterality: Right;  . CYSTOSCOPY WITH RETROGRADE PYELOGRAM, URETEROSCOPY AND STENT PLACEMENT Right 01/23/2019   Procedure: CYSTOSCOPY WITH RIGHT RETROGRADE PYELOGRAM, URETEROSCOPY AND STENT PLACEMENT;  Surgeon: Irine Seal, MD;  Location: [facility];  Service: Urology;  Laterality: Right;  . CYSTOSCOPY WITH URETEROSCOPY AND STENT PLACEMENT Right 04/23/2015   Procedure: CYSTOSCOPY WITH URETEROSCOPY RETROGRADE PYELOGRAM AND STENT PLACEMENT;  Surgeon: Cleon Gustin, MD;  Location: [facility];  Service: Urology;  Laterality: Right;  . CYSTOSCOPY/RETROGRADE/URETEROSCOPY/STONE EXTRACTION WITH BASKET Right 05/12/2015   Procedure: CYSTOSCOPY/RETROGRADE/URETEROSCOPY/STONE EXTRACTION WITH BASKET;  Surgeon: Cleon Gustin, MD;  Location: [facility];  Service: Urology;  Laterality: Right;  . CYSTOSCOPY/URETEROSCOPY/HOLMIUM LASER/STENT PLACEMENT Left 09/18/2018   Procedure: CYSTOSCOPY/ LEFT RETROGRADE LEFT URETEROSCOPY [place] LASER/STENT  PLACEMENT;  Surgeon: Irine Seal, MD;  Location: [facility];  Service: Urology;  Laterality: Left;  . CYSTOSCOPY/URETEROSCOPY/HOLMIUM LASER/STENT PLACEMENT Right 10/24/2019   Procedure: CYSTOSCOPY/URETEROSCOPY/HOLMIUM LASER/STENT PLACEMENT;  Surgeon: Irine Seal, MD;  Location: [facility];  Service: Urology;  Laterality: Right;  . DENTAL SURGERY    . EXTRACORPOREAL SHOCK WAVE LITHOTRIPSY  x2 in  2006//   x2  in 2012  . HOLMIUM LASER APPLICATION Right 66/44/0347   Procedure: HOLMIUM LASER APPLICATION;  Surgeon: Cleon Gustin, MD;  Location: [facility];  Service: Urology;  Laterality: Right;  . HOLMIUM LASER APPLICATION Right 10/18/5954   Procedure: HOLMIUM LASER APPLICATION;  Surgeon: Irine Seal, MD;  Location: [facility];  Service: Urology;  Laterality: Right;  . IR URETERAL STENT RIGHT NEW ACCESS W/O SEP NEPHROSTOMY CATH  11/25/2019  . LAPAROSCOPIC APPENDECTOMY N/A 11/07/2012   Procedure: APPENDECTOMY LAPAROSCOPIC;  Surgeon: Harl Bowie, MD;  Location: [place];  Service: General;  Laterality: N/A;  . NEPHROLITHOTOMY Left 08/06/2015   Procedure: 1ST STAGE  LEFT PERCUTANEOUS NEPHROLITHOTOMY ;  Surgeon: Irine Seal, MD;  Location: [facility];  Service: Urology;  Laterality: Left;  . NEPHROLITHOTOMY Right 11/26/2019   Procedure: NEPHROLITHOTOMY PERCUTANEOUS;  Surgeon: Irine Seal, MD;  Location: [facility];  Service: Urology;  Laterality: Right;  . NEPHROLITHOTOMY Right 11/28/2019   Procedure: NEPHROLITHOTOMY PERCUTANEOUS SECOND LOOK;  Surgeon: Alexis Frock, MD;  Location: [facility];  Service: Urology;  Laterality: Right;  . TONSILLECTOMY  1975  . TRANSURETHRAL RESECTION OF BLADDER TUMOR  1998   Family History  Problem Relation Age of Onset  . Cancer Other        Colon and Prostate Cancer  . Hypertension Other   . Diabetes Other   . Alcohol abuse Neg Hx   . Drug abuse Neg Hx   . Early death Neg Hx   . Heart disease Neg Hx   . Hyperlipidemia Neg Hx   . Kidney disease Neg  Hx   . Stroke Neg Hx    Social History   Socioeconomic History  . Marital status: Married    Spouse name: Not on file  . Number of children: Not on file  . Years of education: Not on file  . Highest education level: Master's degree (e.g., MA, MS, MEng, MEd, MSW, MBA)  Occupational History  . Not on file  Tobacco Use  . Smoking status: Current Every Day Smoker    Packs/day: 0.50    Years: 30.00    Pack years: 15.00    Types: Cigarettes  . Smokeless tobacco: Never Used  Vaping Use  . Vaping Use: Never used  Substance and Sexual Activity  . Alcohol use: Not Currently    Alcohol/week: 5.0 standard drinks    Types: 5 Shots of liquor per week    Comment: occasional  . Drug use: No  . Sexual activity: Yes  Other Topics Concern  . Not on file  Social History Narrative  . Not on file   Social Determinants of Health   Financial Resource Strain: Low Risk   . Difficulty of Paying Living Expenses: Not hard at all  Food Insecurity: No Food Insecurity  . Worried About Charity fundraiser in the Last Year: Never true  . Ran Out of Food in the Last Year: Never true  Transportation Needs: No Transportation Needs  . Lack of Transportation (Medical): No  . Lack of Transportation (Non-Medical): No  Physical Activity: Sufficiently Active  . Days of Exercise per Week: 5 days  . Minutes of Exercise per Session: 30 min  Stress: No Stress Concern Present  . Feeling of Stress : Not at all  Social Connections: Socially Integrated  . Frequency of Communication with Friends and Family: More than three times a week  . Frequency of Social Gatherings with Friends and Family: More than three times a week  . Attends Religious Services: More than 4 times per year  . Active Member of Clubs or Organizations: Yes  . Attends Archivist Meetings: More than 4 times per year  . Marital Status: Married    Tobacco Counseling Ready to quit: No Counseling given: Not Answered   Clinical  Intake:  Pre-visit preparation completed: Yes  Pain : No/denies pain     Nutritional Risks: None Diabetes: Yes CBG done?: No Did pt. bring in CBG monitor from home?: No  How often do you need to have someone help you when you read instructions, pamphlets, or other written materials from your doctor or pharmacy?: 1 - Never What is the last grade level you completed in school?: Master's Degree  Diabetic? Yes, on insulin pump  Interpreter Needed?: No  Information entered by :: Lisette Abu, LPN   Activities of Daily Living In your present state of health, do you have any difficulty performing the following activities: 08/17/2020 11/26/2019  Hearing? N N  Vision? N N  Difficulty concentrating or making decisions? N  N  Walking or climbing stairs? N N  Dressing or bathing? N N  Doing errands, shopping? N N  Preparing Food and eating ? N -  Using the Toilet? N -  In the past six months, have you accidently leaked urine? N -  Do you have problems with loss of bowel control? N -  Managing your Medications? N -  Managing your Finances? N -  Housekeeping or managing your Housekeeping? N -  Some recent data might be hidden    Patient Care Team: Janith Lima, MD as PCP - General (Internal Medicine) Irine Seal, MD as Consulting Physician (Urology) Delrae Rend, MD as Consulting Physician (Endocrinology)  Indicate any recent Medical Services you may have received from other than [facility] providers in the past year (date may be approximate).     Assessment:   This is a routine wellness examination for [place].  Hearing/Vision screen No exam data present  Dietary issues and exercise activities discussed: Current Exercise Habits: Home exercise routine, Type of exercise: walking, Time (Minutes): 30, Frequency (Times/Week): 5, Weekly Exercise (Minutes/Week): 150, Intensity: Moderate, Exercise limited by: None identified  Goals    . Client understands the importance of  follow-up with providers by attending scheduled visits     I would like to maintain active by walking everyday, watch my diet and continue to drink water.  I would like to lose 15-20 pounds with diet and exercise.      Depression Screen PHQ 2/9 Scores 08/17/2020 07/28/2020 01/13/2020 09/28/2017 09/26/2017 07/01/2013 07/01/2013  PHQ - 2 Score 0 0 0 0 0 0 0  PHQ- 9 Score - - - - 2 - -    Fall Risk Fall Risk  08/17/2020 07/28/2020 01/13/2020 09/28/2017 09/26/2017  Falls in the past year? 1 1 1  No No  Number falls in past yr: 0 0 0 - -  Injury with Fall? 0 0 1 - -  Risk for fall due to : History of fall(s) History of fall(s) Other (Comment) - -  Risk for fall due to: Comment - - Patient stepped of walk way and landed on Knee - -  Follow up Falls evaluation completed Falls evaluation completed Falls evaluation completed - -    FALL RISK PREVENTION PERTAINING TO THE HOME:  Any stairs in or around the home? Yes  If so, are there any without handrails? No  Home free of loose throw rugs in walkways, pet beds, electrical cords, etc? Yes  Adequate lighting in your home to reduce risk of falls? Yes   ASSISTIVE DEVICES UTILIZED TO PREVENT FALLS:  Life alert? No  Use of a cane, walker or w/c? No  Grab bars in the bathroom? Yes  Shower chair or bench in shower? No  Elevated toilet seat or a handicapped toilet? Yes   TIMED UP AND GO:  Was the test performed? No .  Length of time to ambulate 10 feet: 0 sec.   Gait steady and fast without use of assistive device  Cognitive Function: Patient is cogitatively intact.        Immunizations Immunization History  Administered Date(s) Administered  . Fluad Quad(high Dose 65+) 07/28/2020  . Hep A / Hep B 09/24/2012, 11/05/2012  . Influenza Whole 05/02/2013  . Influenza, High Dose Seasonal PF 04/04/2018, 04/02/2019, 04/29/2020  . Influenza, Seasonal, Injecte, Preservative Fre 07/01/2013  . Influenza,inj,Quad PF,6+ Mos 05/23/2011, 05/02/2012,  03/07/2014, 03/22/2016  . Influenza,inj,quad, With Preservative 04/29/2015  . Influenza-Unspecified 04/17/2017, 04/02/2019  . PFIZER(Purple  Top)SARS-COV-2 Vaccination 08/24/2019, 09/18/2019, 05/10/2020  . Pneumococcal Conjugate-13 04/08/2014  . Pneumococcal Polysaccharide-23 11/03/2010, 03/04/2013, 07/28/2020  . Tdap 06/26/2012, 01/13/2020  . Zoster 02/23/2011    TDAP status: Up to date  Flu Vaccine status: Up to date  Pneumococcal vaccine status: Up to date  Covid-19 vaccine status: Completed vaccines  Qualifies for Shingles Vaccine? Yes   Zostavax completed Yes   Shingrix Completed?: No.    Education has been provided regarding the importance of this vaccine. Patient has been advised to call insurance company to determine out of pocket expense if they have not yet received this vaccine. Advised may also receive vaccine at local pharmacy or Health Dept. Verbalized acceptance and understanding.  Screening Tests Health Maintenance  Topic Date Due  . OPHTHALMOLOGY EXAM  05/07/2020  . COVID-19 Vaccine (4 - Booster for Pfizer series) 11/08/2020  . HEMOGLOBIN A1C  01/25/2021  . FOOT EXAM  07/28/2021  . COLONOSCOPY (Pts 45-59yrs Insurance coverage will need to be confirmed)  07/28/2026  . TETANUS/TDAP  01/12/2030  . INFLUENZA VACCINE  Completed  . Hepatitis C Screening  Completed  . PNA vac Low Risk Adult  Completed    Health Maintenance  Health Maintenance Due  Topic Date Due  . OPHTHALMOLOGY EXAM  05/07/2020    Colorectal cancer screening: Type of screening: Colonoscopy. Completed 07/28/2016. Repeat every 10 years  Lung Cancer Screening: (Low Dose CT Chest recommended if Age 77-80 years, 30 pack-year currently smoking OR have quit w/in 15years.) does qualify.   Lung Cancer Screening Referral: no  Additional Screening:  Hepatitis C Screening: does qualify; Completed yes  Vision Screening: Recommended annual ophthalmology exams for early detection of glaucoma and other  disorders of the eye. Is the patient up to date with their annual eye exam?  Yes  Who is the provider or what is the name of the office in which the patient attends annual eye exams? Sherlynn Stalls, MD. If pt is not established with a provider, would they like to be referred to a provider to establish care? No .   Dental Screening: Recommended annual dental exams for proper oral hygiene  Community Resource Referral / Chronic Care Management: CRR required this visit?  No   CCM required this visit?  No      Plan:     I have personally reviewed and noted the following in the patient's chart:   . Medical and social history . Use of alcohol, tobacco or illicit drugs  . Current medications and supplements . Functional ability and status . Nutritional status . Physical activity . Advanced directives . List of other physicians . Hospitalizations, surgeries, and ER visits in previous 12 months . Vitals . Screenings to include cognitive, depression, and falls . Referrals and appointments  In addition, I have reviewed and discussed with patient certain preventive protocols, quality metrics, and best practice recommendations. A written personalized care plan for preventive services as well as general preventive health recommendations were provided to patient.     Sheral Flow, LPN   [id number]   Nurse Notes:  Patient is cogitatively intact. There were no vitals filed for this visit. There is no height or weight on file to calculate BMI.

## 2020-08-17 NOTE — Patient Instructions (Addendum)
Anthony Daniel , Thank you for taking time to come for your Medicare Wellness Visit. I appreciate your ongoing commitment to your health goals. Please review the following plan we discussed and let me know if I can assist you in the future.   Screening recommendations/referrals: Colonoscopy: 07/28/2016; due every 10 years Recommended yearly ophthalmology/optometry visit for glaucoma screening and checkup Recommended yearly dental visit for hygiene and checkup  Vaccinations: Influenza vaccine: 07/28/2020 Pneumococcal vaccine: up to date Tdap vaccine: 01/13/2020; due every 10 years Shingles vaccine: never done   Covid-19: up to date  Advanced directives: Advance directive discussed with you today. I have provided a copy for you to complete at home and have notarized. Once this is complete please bring a copy in to our office so we can scan it into your chart.  Conditions/risks identified: Yes; Reviewed health maintenance screenings with patient today and relevant education, vaccines, and/or referrals were provided. Please continue to do your personal lifestyle choices by: daily care of teeth and gums, regular physical activity (goal should be 5 days a week for 30 minutes), eat a healthy diet, avoid tobacco and drug use, limiting any alcohol intake, taking a low-dose aspirin (if not allergic or have been advised by your provider otherwise) and taking vitamins and minerals as recommended by your provider. Continue doing brain stimulating activities (puzzles, reading, adult coloring books, staying active) to keep memory sharp. Continue to eat heart healthy diet (full of fruits, vegetables, whole grains, lean protein, water--limit salt, fat, and sugar intake) and increase physical activity as tolerated.  Next appointment: Please schedule your next Medicare Wellness Visit with your Nurse Health Advisor in 1 year by calling [phone].  Preventive Care 68 Years and Older, Male Preventive care refers to  lifestyle choices and visits with your health care provider that can promote health and wellness. What does preventive care include?  A yearly physical exam. This is also called an annual well check.  Dental exams once or twice a year.  Routine eye exams. Ask your health care provider how often you should have your eyes checked.  Personal lifestyle choices, including:  Daily care of your teeth and gums.  Regular physical activity.  Eating a healthy diet.  Avoiding tobacco and drug use.  Limiting alcohol use.  Practicing safe sex.  Taking low doses of aspirin every day.  Taking vitamin and mineral supplements as recommended by your health care provider. What happens during an annual well check? The services and screenings done by your health care provider during your annual well check will depend on your age, overall health, lifestyle risk factors, and family history of disease. Counseling  Your health care provider may ask you questions about your:  Alcohol use.  Tobacco use.  Drug use.  Emotional well-being.  Home and relationship well-being.  Sexual activity.  Eating habits.  History of falls.  Memory and ability to understand (cognition).  Work and work Statistician. Screening  You may have the following tests or measurements:  Height, weight, and BMI.  Blood pressure.  Lipid and cholesterol levels. These may be checked every 5 years, or more frequently if you are over 19 years old.  Skin check.  Lung cancer screening. You may have this screening every year starting at age 68 if you have a 30-pack-year history of smoking and currently smoke or have quit within the past 15 years.  Fecal occult blood test (FOBT) of the stool. You may have this test every year starting at age  68.  Flexible sigmoidoscopy or colonoscopy. You may have a sigmoidoscopy every 5 years or a colonoscopy every 10 years starting at age 68.  Prostate cancer screening.  Recommendations will vary depending on your family history and other risks.  Hepatitis C blood test.  Hepatitis B blood test.  Sexually transmitted disease (STD) testing.  Diabetes screening. This is done by checking your blood sugar (glucose) after you have not eaten for a while (fasting). You may have this done every 1-3 years.  Abdominal aortic aneurysm (AAA) screening. You may need this if you are a current or former smoker.  Osteoporosis. You may be screened starting at age 68 if you are at high risk. Talk with your health care provider about your test results, treatment options, and if necessary, the need for more tests. Vaccines  Your health care provider may recommend certain vaccines, such as:  Influenza vaccine. This is recommended every year.  Tetanus, diphtheria, and acellular pertussis (Tdap, Td) vaccine. You may need a Td booster every 10 years.  Zoster vaccine. You may need this after age 68.  Pneumococcal 13-valent conjugate (PCV13) vaccine. One dose is recommended after age 68.  Pneumococcal polysaccharide (PPSV23) vaccine. One dose is recommended after age 68. Talk to your health care provider about which screenings and vaccines you need and how often you need them. This information is not intended to replace advice given to you by your health care provider. Make sure you discuss any questions you have with your health care provider. Document Released: 07/31/2015 Document Revised: 03/23/2016 Document Reviewed: 05/05/2015 Elsevier Interactive Patient Education  2017 [place] Prevention in the Home Falls can cause injuries. They can happen to people of all ages. There are many things you can do to make your home safe and to help prevent falls. What can I do on the outside of my home?  Regularly fix the edges of walkways and driveways and fix any cracks.  Remove anything that might make you trip as you walk through a door, such as a raised step or  threshold.  Trim any bushes or trees on the path to your home.  Use bright outdoor lighting.  Clear any walking paths of anything that might make someone trip, such as rocks or tools.  Regularly check to see if handrails are loose or broken. Make sure that both sides of any steps have handrails.  Any raised decks and porches should have guardrails on the edges.  Have any leaves, snow, or ice cleared regularly.  Use sand or salt on walking paths during winter.  Clean up any spills in your garage right away. This includes oil or grease spills. What can I do in the bathroom?  Use night lights.  Install grab bars by the toilet and in the tub and shower. Do not use towel bars as grab bars.  Use non-skid mats or decals in the tub or shower.  If you need to sit down in the shower, use a plastic, non-slip stool.  Keep the floor dry. Clean up any water that spills on the floor as soon as it happens.  Remove soap buildup in the tub or shower regularly.  Attach bath mats securely with double-sided non-slip rug tape.  Do not have throw rugs and other things on the floor that can make you trip. What can I do in the bedroom?  Use night lights.  Make sure that you have a light by your bed that is easy to reach.  Do not use any sheets or blankets that are too big for your bed. They should not hang down onto the floor.  Have a firm chair that has side arms. You can use this for support while you get dressed.  Do not have throw rugs and other things on the floor that can make you trip. What can I do in the kitchen?  Clean up any spills right away.  Avoid walking on wet floors.  Keep items that you use a lot in easy-to-reach places.  If you need to reach something above you, use a strong step stool that has a grab bar.  Keep electrical cords out of the way.  Do not use floor polish or wax that makes floors slippery. If you must use wax, use non-skid floor wax.  Do not have  throw rugs and other things on the floor that can make you trip. What can I do with my stairs?  Do not leave any items on the stairs.  Make sure that there are handrails on both sides of the stairs and use them. Fix handrails that are broken or loose. Make sure that handrails are as long as the stairways.  Check any carpeting to make sure that it is firmly attached to the stairs. Fix any carpet that is loose or worn.  Avoid having throw rugs at the top or bottom of the stairs. If you do have throw rugs, attach them to the floor with carpet tape.  Make sure that you have a light switch at the top of the stairs and the bottom of the stairs. If you do not have them, ask someone to add them for you. What else can I do to help prevent falls?  Wear shoes that:  Do not have high heels.  Have rubber bottoms.  Are comfortable and fit you well.  Are closed at the toe. Do not wear sandals.  If you use a stepladder:  Make sure that it is fully opened. Do not climb a closed stepladder.  Make sure that both sides of the stepladder are locked into place.  Ask someone to hold it for you, if possible.  Clearly mark and make sure that you can see:  Any grab bars or handrails.  First and last steps.  Where the edge of each step is.  Use tools that help you move around (mobility aids) if they are needed. These include:  Canes.  Walkers.  Scooters.  Crutches.  Turn on the lights when you go into a dark area. Replace any light bulbs as soon as they burn out.  Set up your furniture so you have a clear path. Avoid moving your furniture around.  If any of your floors are uneven, fix them.  If there are any pets around you, be aware of where they are.  Review your medicines with your doctor. Some medicines can make you feel dizzy. This can increase your chance of falling. Ask your doctor what other things that you can do to help prevent falls. This information is not intended to  replace advice given to you by your health care provider. Make sure you discuss any questions you have with your health care provider. Document Released: 04/30/2009 Document Revised: 12/10/2015 Document Reviewed: 08/08/2014 Elsevier Interactive Patient Education  2017 [employer].

## 2020-08-24 ENCOUNTER — Encounter: Payer: Self-pay | Admitting: Internal Medicine

## 2020-08-25 ENCOUNTER — Ambulatory Visit: Payer: Medicare Other | Admitting: Internal Medicine

## 2020-08-27 ENCOUNTER — Other Ambulatory Visit: Payer: Self-pay | Admitting: Internal Medicine

## 2020-08-27 DIAGNOSIS — I10 Essential (primary) hypertension: Secondary | ICD-10-CM

## 2020-09-11 ENCOUNTER — Ambulatory Visit (INDEPENDENT_AMBULATORY_CARE_PROVIDER_SITE_OTHER): Payer: Medicare Other | Admitting: Internal Medicine

## 2020-09-11 ENCOUNTER — Encounter: Payer: Self-pay | Admitting: Internal Medicine

## 2020-09-11 ENCOUNTER — Other Ambulatory Visit: Payer: Self-pay

## 2020-09-11 VITALS — BP 136/76 | HR 79 | Temp 98.2°F | Resp 16 | Ht 73.0 in | Wt 253.0 lb

## 2020-09-11 DIAGNOSIS — M5412 Radiculopathy, cervical region: Secondary | ICD-10-CM | POA: Diagnosis not present

## 2020-09-11 DIAGNOSIS — R270 Ataxia, unspecified: Secondary | ICD-10-CM | POA: Diagnosis not present

## 2020-09-11 DIAGNOSIS — R29898 Other symptoms and signs involving the musculoskeletal system: Secondary | ICD-10-CM | POA: Insufficient documentation

## 2020-09-11 NOTE — Progress Notes (Signed)
Subjective:  [name]., male    DOB: 08-Jan-1951  Age: 70 y.o. MRN: [id number]  CC: Gait Problem  This visit occurred during the SARS-CoV-2 public health emergency.  Safety protocols were in place, including screening questions prior to the visit, additional usage of staff PPE, and extensive cleaning of exam room while observing appropriate contact time as indicated for disinfecting solutions.    HPI Gerson Fauth. presents for f/up -  He complains of 4-week history of generalized ataxia, weakness in his right upper and right lower extremity, and right-sided neck pain that radiates towards the right shoulder, He denies headache, blurred vision, slurred speech, dizziness, lightheadedness, injury, or palpitations.  Outpatient Medications Prior to Visit  Medication Sig Dispense Refill  . aspirin EC 81 MG tablet Take 81 mg by mouth daily.    . candesartan (ATACAND) 16 MG tablet Take 1 tablet (16 mg total) by mouth daily. 90 tablet 1  . Cholecalciferol (VITAMIN D) 2000 units tablet Take 2,000 Units by mouth daily.    . Ciclopirox 0.77 % gel APPLY TO AFFECTED AREA TWICE A DAY 30 g 2  . Continuous Blood Gluc Receiver ([place]) DEVI See admin instructions.    . Continuous Blood Gluc Sensor (DEXCOM G6 SENSOR) MISC change sensor    . Continuous Blood Gluc Transmit (DEXCOM G6 TRANSMITTER) MISC See admin instructions.    Marland Kitchen ezetimibe (ZETIA) 10 MG tablet TAKE 1 TABLET EVERY DAY 90 tablet 1  . fluticasone (FLONASE) 50 MCG/ACT nasal spray Place 2 sprays into both nostrils daily. (Patient taking differently: Place 2 sprays into both nostrils daily as needed for allergies.) 16 g 3  . Glucagon (GVOKE HYPOPEN 2-PACK) 1 MG/0.2ML SOAJ Inject 1 Act into the skin daily as needed. 2 mL 5  . glucose blood (ONETOUCH VERIO) test strip 5X A DAY    . indapamide (LOZOL) 1.25 MG tablet TAKE 1 TABLET BY MOUTH DAILY. 90 tablet 0  . Insulin Glargine (LANTUS SOLOSTAR [place]) Inject 15 Units into  the skin 2 (two) times daily as needed. Only uses when pump goes out    . Insulin Human (INSULIN PUMP) SOLN Inject 1 each into the skin continuous. Novolog insulin    . levocetirizine (XYZAL) 5 MG tablet TAKE 1 TABLET (5 MG TOTAL) BY MOUTH DAILY AS NEEDED FOR ALLERGIES. 90 tablet 1  . NOVOLOG 100 UNIT/ML injection Inject 60 Units into the skin See admin instructions. With pump. Through out the day    . Polyethyl Glycol-Propyl Glycol (SYSTANE OP) Place 1 drop into both eyes every 8 (eight) hours as needed (dry eyes).    . sildenafil (REVATIO) 20 MG tablet Take 20 mg by mouth daily as needed (erectile dysfunction).     No facility-administered medications prior to visit.    ROS Review of Systems  Constitutional: Negative.  Negative for appetite change, diaphoresis and fatigue.  HENT: Negative.  Negative for trouble swallowing.   Eyes: Negative.   Respiratory: Negative for cough, chest tightness, shortness of breath and wheezing.   Cardiovascular: Negative for chest pain, palpitations and leg swelling.  Gastrointestinal: Negative for abdominal pain, diarrhea and vomiting.  Endocrine: Negative.   Genitourinary: Negative.  Negative for difficulty urinating.  Musculoskeletal: Positive for gait problem and neck pain. Negative for arthralgias and back pain.  Skin: Negative.   Neurological: Positive for weakness. Negative for dizziness, seizures, facial asymmetry, light-headedness, numbness and headaches.  Hematological: Negative.  Negative for adenopathy. Does not bruise/bleed easily.  Psychiatric/Behavioral: Negative.     Objective:  BP 136/76   Pulse 79   Temp 98.2 F (36.8 C) (Oral)   Resp 16   Ht 6\' 1"  (1.854 m)   Wt 253 lb (114.8 kg)   SpO2 98%   BMI 33.38 kg/m   BP Readings from Last 3 Encounters:  09/11/20 136/76  07/28/20 136/76  01/13/20 116/62    Wt Readings from Last 3 Encounters:  09/11/20 253 lb (114.8 kg)  07/28/20 244 lb 6.4 oz (110.9 kg)  01/13/20 247 lb (112  kg)    Physical Exam Vitals reviewed.  Constitutional:      Appearance: Normal appearance.  HENT:     Nose: Nose normal.     Mouth/Throat:     Mouth: Mucous membranes are moist.  Eyes:     General: No scleral icterus.    Extraocular Movements: Extraocular movements intact.     Conjunctiva/sclera: Conjunctivae normal.     Pupils: Pupils are equal, round, and reactive to light.  Cardiovascular:     Rate and Rhythm: Normal rate and regular rhythm.     Heart sounds: No murmur heard.   Pulmonary:     Effort: Pulmonary effort is normal.     Breath sounds: No stridor. No wheezing, rhonchi or rales.  Abdominal:     General: Abdomen is flat. There is no distension.  Musculoskeletal:        General: Normal range of motion.     Cervical back: Normal range of motion and neck supple. No tenderness.     Right lower leg: No edema.     Left lower leg: No edema.  Skin:    General: Skin is warm and dry.     Coloration: Skin is not pale.  Neurological:     Mental Status: He is alert and oriented to person, place, and time.     Cranial Nerves: Cranial nerves are intact.     Sensory: Sensation is intact. No sensory deficit.     Motor: No weakness, atrophy or abnormal muscle tone.     Coordination: Coordination is intact. Coordination normal.     Gait: Gait normal.     Deep Tendon Reflexes: Reflexes abnormal.     Reflex Scores:      Tricep reflexes are 1+ on the right side and 1+ on the left side.      Bicep reflexes are 1+ on the right side and 1+ on the left side.      Brachioradialis reflexes are 1+ on the right side and 1+ on the left side.      Patellar reflexes are 1+ on the right side and 2+ on the left side.      Achilles reflexes are 0 on the right side and 0 on the left side. Psychiatric:        Mood and Affect: Mood normal.        Behavior: Behavior normal.     Lab Results  Component Value Date   WBC 10.2 07/28/2020   HGB 14.7 07/28/2020   HCT 43.8 07/28/2020   PLT  179.0 07/28/2020   GLUCOSE 87 07/28/2020   CHOL 108 07/28/2020   TRIG 86.0 07/28/2020   HDL 42.30 07/28/2020   LDLCALC 49 07/28/2020   ALT 18 07/28/2020   AST 16 07/28/2020   NA 138 07/28/2020   K 3.7 07/28/2020   CL 101 07/28/2020   CREATININE 1.09 07/28/2020   BUN 17 07/28/2020   CO2 29 07/28/2020  TSH 1.40 07/28/2020   PSA 7.64 (H) 07/28/2020   INR 1.0 11/25/2019   HGBA1C 7.7 (H) 07/28/2020   MICROALBUR 1.6 07/28/2020    CT Renal Stone Study  Result Date: 12/03/2019 CLINICAL DATA:  Right flank pain, dysuria. History of nephrolithotomy 11/26/2019 EXAM: CT ABDOMEN AND PELVIS WITHOUT CONTRAST TECHNIQUE: Multidetector CT imaging of the abdomen and pelvis was performed following the standard protocol without IV contrast. COMPARISON:  11/27/2019 FINDINGS: Lower chest: Small right pleural effusion with dependent right basilar airspace consolidation. Mild left basilar atelectasis. Coronary artery calcification. Hepatobiliary: No focal liver abnormality is seen. No gallstones, gallbladder wall thickening, or biliary dilatation. Pancreas: Unremarkable. No pancreatic ductal dilatation or surrounding inflammatory changes. Spleen: Normal in size without focal abnormality. Adrenals/Urinary Tract: Unremarkable adrenal glands. Interval removal of the previously seen right percutaneous nephroureteral stents with interval placement of a well-positioned double-J right-sided nephroureteral stent. Small amount of residual gas within the right renal collecting system, decreased from prior. There are a few small locules of residual gas within the right perirenal space (series 2, images 40-41), decreased from prior. The previously seen large right renal calculus is no longer evident. There is a tiny 1 mm punctate residual stone within the inferior calyx of the right kidney. Multiple nonobstructing left-sided renal stones, the 2 largest measuring 8 and 7 mm. Left ureter is unremarkable. There is a 5 mm stone  located within the dependent portion of the urinary bladder. Bladder appears otherwise unremarkable. Stomach/Bowel: Stomach is within normal limits. Appendix is surgically absent. No evidence of bowel wall thickening, distention, or inflammatory changes. Vascular/Lymphatic: Aortoiliac atherosclerosis without aneurysm. No abdominopelvic lymphadenopathy. Reproductive: Mildly prominent prostate gland with coarse calcification. Other: No free fluid within the abdomen or pelvis. No abdominal wall hernia. Musculoskeletal: No acute or significant osseous findings. IMPRESSION: 1. Interval placement of a well-positioned double-J right-sided nephroureteral stent without evidence of complication 2. The previously seen large right renal calculus is no longer evident. There is a tiny 1 mm residual stone within the inferior calyx of the right kidney. 3. Multiple nonobstructing left-sided renal stones, the 2 largest measuring 8 mm and 7 mm. 4. There is a 5 mm stone located within the dependent portion of the urinary bladder. 5. Small right pleural effusion with dependent right basilar airspace consolidation, which may represent atelectasis or pneumonia. 6. Aortic Atherosclerosis (ICD10-I70.0). Electronically Signed   By: Davina Poke D.O.   On: 12/03/2019 08:12    Assessment & Plan:   Arlee was seen today for gait problem.  Diagnoses and all orders for this visit:  Ataxia- He has multiple neurological complaints and asymmetrical DTRs in the patella.  I am concerned about cervical radicular abnormalities, demyelination, CVA, mass, tumor, and NPH.  I have asked him to undergo an MRI of the brain and cervical spine without contrast since he is allergic to contrast. -     MR Brain Wo Contrast; Future  Weakness of right upper extremity -     MR Brain Wo Contrast; Future -     MR Cervical Spine Wo Contrast; Future  Weakness of right lower extremity -     MR Brain Wo Contrast; Future -     MR Cervical Spine Wo  Contrast; Future  Radiculitis of right cervical region -     MR Cervical Spine Wo Contrast; Future   I am having Anthony Daniel. "Tommy" maintain his Insulin Glargine (LANTUS SOLOSTAR [place]), aspirin EC, Polyethyl Glycol-Propyl Glycol (SYSTANE OP), insulin pump, sildenafil, Vitamin D,  NovoLOG, fluticasone, Gvoke HypoPen 2-Pack, Ciclopirox, candesartan, ezetimibe, OneTouch Verio, Dexcom G6 Receiver, Dexcom G6 Sensor, Dexcom G6 Transmitter, levocetirizine, and indapamide.  No orders of the defined types were placed in this encounter.    Follow-up: No follow-ups on file.  Scarlette Calico, MD

## 2020-09-23 DIAGNOSIS — N4232 Atypical small acinar proliferation of prostate: Secondary | ICD-10-CM | POA: Diagnosis not present

## 2020-09-23 DIAGNOSIS — C61 Malignant neoplasm of prostate: Secondary | ICD-10-CM | POA: Diagnosis not present

## 2020-10-05 ENCOUNTER — Ambulatory Visit
Admission: RE | Admit: 2020-10-05 | Discharge: 2020-10-05 | Disposition: A | Discharge status: Home / Self Care | Payer: Medicare Other | Source: Ambulatory Visit | Attending: Internal Medicine | Admitting: Internal Medicine

## 2020-10-05 ENCOUNTER — Other Ambulatory Visit: Payer: Self-pay

## 2020-10-05 DIAGNOSIS — J3489 Other specified disorders of nose and nasal sinuses: Secondary | ICD-10-CM | POA: Diagnosis not present

## 2020-10-05 DIAGNOSIS — R29898 Other symptoms and signs involving the musculoskeletal system: Secondary | ICD-10-CM

## 2020-10-05 DIAGNOSIS — R270 Ataxia, unspecified: Secondary | ICD-10-CM

## 2020-10-05 DIAGNOSIS — G9389 Other specified disorders of brain: Secondary | ICD-10-CM | POA: Diagnosis not present

## 2020-10-05 DIAGNOSIS — M4802 Spinal stenosis, cervical region: Secondary | ICD-10-CM | POA: Diagnosis not present

## 2020-10-05 DIAGNOSIS — M5412 Radiculopathy, cervical region: Secondary | ICD-10-CM

## 2020-10-06 ENCOUNTER — Encounter: Payer: Self-pay | Admitting: Internal Medicine

## 2020-10-06 ENCOUNTER — Other Ambulatory Visit: Payer: Self-pay | Admitting: Internal Medicine

## 2020-10-06 DIAGNOSIS — M4802 Spinal stenosis, cervical region: Secondary | ICD-10-CM | POA: Insufficient documentation

## 2020-10-13 DIAGNOSIS — C61 Malignant neoplasm of prostate: Secondary | ICD-10-CM | POA: Diagnosis not present

## 2020-10-15 ENCOUNTER — Encounter: Payer: Self-pay | Admitting: Medical Oncology

## 2020-10-15 NOTE — Progress Notes (Signed)
Left message asking for a return call to discuss referral to the [facility], [place], 4/8.

## 2020-10-20 ENCOUNTER — Encounter: Payer: Self-pay | Admitting: Medical Oncology

## 2020-10-21 NOTE — Progress Notes (Signed)
GU Location of Tumor / Histology: prostatic adenocarcinoma  If Prostate Cancer, Gleason Score is (4 + 3) and PSA is (5.98). Prostate volume: 41 grams.  Anthony Daniel. has a history of bladder cancer in 1998 but has had no recurrence. Patient has had multiple prostate biopsies. Prostate biopsies done 09/23/20, 08/22/2018, and 04/23/2014 (slides are no longer available.  Biopsies of prostate (if applicable) revealed:   Past/Anticipated interventions by urology, if any: Cystoscopy, prostate biopsies x3, referral to [facility]  Past/Anticipated interventions by medical oncology, if any: no  Weight changes, if any: denies  Bowel/Bladder complaints, if any:    Nausea/Vomiting, if any:   Pain issues, if any:    SAFETY ISSUES:  Prior radiation?   Pacemaker/ICD?   Possible current pregnancy? no, male patient  Is the patient on methotrexate?   Current Complaints / other details:  70 year old male. Married.  Patient explains his mother died earlier this week. As a result he did not complete the [facility] paperwork mailed to him. Requested patient begin working on the paperwork now while awaiting physician arrival.

## 2020-10-22 ENCOUNTER — Encounter: Payer: Self-pay | Admitting: Medical Oncology

## 2020-10-22 NOTE — Progress Notes (Signed)
I called pt to introduce myself as the Prostate Nurse Navigator and the Coordinator of the Prostate [place].  1. I confirmed with the patient he is aware of his referral to the clinic 4/8, arriving @ 8 am.  2. I discussed the format of the clinic and the physicians he will be seeing that day.  3. I discussed where the clinic is located and how to contact me.  4. I confirmed his address and informed him I would be mailing a packet of information and forms to be completed. I asked him to bring them with him the day of his appointment.   He voiced understanding of the above. I asked him to call me if he has any questions or concerns regarding his appointments or the forms he needs to complete.

## 2020-10-22 NOTE — Progress Notes (Signed)
Left message requesting a return call to confirm appointment for [facility], 4/8.

## 2020-10-23 ENCOUNTER — Encounter: Payer: Self-pay | Admitting: Radiation Oncology

## 2020-10-23 ENCOUNTER — Inpatient Hospital Stay: Payer: Medicare Other | Attending: Oncology | Admitting: Oncology

## 2020-10-23 ENCOUNTER — Encounter: Payer: Self-pay | Admitting: General Practice

## 2020-10-23 ENCOUNTER — Ambulatory Visit
Admission: RE | Admit: 2020-10-23 | Discharge: 2020-10-23 | Disposition: A | Discharge status: Home / Self Care | Payer: BC Managed Care – PPO | Source: Ambulatory Visit | Attending: Radiation Oncology | Admitting: Radiation Oncology

## 2020-10-23 ENCOUNTER — Other Ambulatory Visit: Payer: Self-pay

## 2020-10-23 ENCOUNTER — Encounter: Payer: Self-pay | Admitting: Medical Oncology

## 2020-10-23 VITALS — BP 139/71 | HR 79 | Ht 73.0 in | Wt 250.8 lb

## 2020-10-23 DIAGNOSIS — I10 Essential (primary) hypertension: Secondary | ICD-10-CM | POA: Diagnosis not present

## 2020-10-23 DIAGNOSIS — C61 Malignant neoplasm of prostate: Secondary | ICD-10-CM | POA: Diagnosis not present

## 2020-10-23 DIAGNOSIS — Z8551 Personal history of malignant neoplasm of bladder: Secondary | ICD-10-CM | POA: Insufficient documentation

## 2020-10-23 DIAGNOSIS — E785 Hyperlipidemia, unspecified: Secondary | ICD-10-CM | POA: Insufficient documentation

## 2020-10-23 DIAGNOSIS — Z794 Long term (current) use of insulin: Secondary | ICD-10-CM | POA: Diagnosis not present

## 2020-10-23 DIAGNOSIS — F17210 Nicotine dependence, cigarettes, uncomplicated: Secondary | ICD-10-CM | POA: Insufficient documentation

## 2020-10-23 DIAGNOSIS — E119 Type 2 diabetes mellitus without complications: Secondary | ICD-10-CM | POA: Insufficient documentation

## 2020-10-23 DIAGNOSIS — Z79899 Other long term (current) drug therapy: Secondary | ICD-10-CM | POA: Insufficient documentation

## 2020-10-23 DIAGNOSIS — N200 Calculus of kidney: Secondary | ICD-10-CM | POA: Insufficient documentation

## 2020-10-23 HISTORY — DX: Malignant neoplasm of prostate: C61

## 2020-10-23 NOTE — Progress Notes (Signed)
                               Care Plan Summary  Name: Mr. Dajion Bickford DOB: Dec 31, 1950   Your Medical Team:   Urologist -  Dr. Raynelle Bring, [facility]  Radiation Oncologist - Dr. Tyler Pita, [facility]   Medical Oncologist - Dr. Zola Button, [place]  Recommendations: 1) Radiation or sugery 2) Genetic testing    * These recommendations are based on information available as of today's consult.      Recommendations may change depending on the results of further tests or exams.   Next Steps: 1) Consider your options and call Cira Rue, RN   When appointments need to be scheduled, you will be contacted by [facility] and/or [facility].  Questions?  Please do not hesitate to call Cira Rue, RN, BSN, OCN at [phone] any questions or concerns.  Shirlean Mylar is your Oncology Nurse Navigator and is available to assist you while you're receiving your medical care at [facility].:

## 2020-10-23 NOTE — Progress Notes (Signed)
Radiation Oncology         [phone] ________________________________  Multidisciplinary Prostate Cancer Clinic  Initial Radiation Oncology Consultation  Name: Anthony Daniel. MRN: [id number]  Date: 10/23/2020  DOB: [birth year]  VO:ZDGUY, Arvid Right, MD  Irine Seal, MD   REFERRING PHYSICIAN: Irine Seal, MD  DIAGNOSIS: 70 y.o. gentleman with stage T1c adenocarcinoma of the prostate with a Gleason's score of 4+3 and a PSA of 7.64    ICD-10-CM   1. Prostate cancer [facility])  Anthony Daniel. is a 70 y.o. gentleman. He has a history of bladder cancer, diagnosed and treated in 1998 with no evidence of recurrence on follow up surveillance cystoscopies. He has been followed by Dr. Jeffie Pollock for BPH with BOO and a rising PSA since at least 2015. His initial prostate biopsy in 04/2014 was negative and his LUTS are well controlled on Flomax daily. His PSA increased to 4.14 in 06/2018, and he underwent repeat prostate biopsy on 08/22/2018 which revealed Gleason 3+3 in 5% of one core. After discussing treatment options, he appropriately elected to proceed in active surveillance.  He has a history of renal stones managed by Dr. Jeffie Pollock as well as Dr. Kandee Keen at [facility] and Dr. Roby Lofts at [facility]. His PSA has fluctuated over the past 2 years, increasing to 6.08 in 09/2019 , prompting a prostate MRI on 11/11/19 at [facility]. This study did not reveal any concerning lesions or evidence of high risk disease. The PSA decreased slightly to 5.66 in 12/2019 and remained stable elevated at 5.98 on 03/30/20 before rising to 7.64 on 07/28/20.  The patient proceeded to surveillance transrectal ultrasound with 12 biopsies of the prostate on 09/23/20. The prostate volume measured 41 cc.  Out of 12 core biopsies, 4 were positive.  The maximum Gleason score was 4+3, and this was seen in the left mid lateral and right apex. Additionally, Gleason 3+4 was seen in the right mid lateral, and Gleason  3+3 in the right mid.    The patient reviewed the biopsy results with his urologist and he has kindly been referred today to the multidisciplinary prostate cancer clinic for presentation of pathology and radiology studies in our conference for discussion of potential radiation treatment options and clinical evaluation.   PREVIOUS RADIATION THERAPY: No  PAST MEDICAL HISTORY:  has a past medical history of Arthritis, Cancer ([place]), CKD (chronic kidney disease), stage I, History of bladder cancer, History of kidney stones, Hyperlipidemia, Hypertension, Insulin pump in place, LADA (latent autoimmune diabetes in adults), managed as type 1 ([place]), Nephrolithiasis, PONV (postoperative nausea and vomiting), Prostate cancer ([place]), and Spinal stenosis, cervical region.    PAST SURGICAL HISTORY: Past Surgical History:  Procedure Laterality Date  . COLONOSCOPY  2018   x2  . CYSTOSCOPY W/ URETERAL STENT PLACEMENT Right 05/12/2015   Procedure: CYSTOSCOPY WITH STENT REPLACEMENT;  Surgeon: Cleon Gustin, MD;  Location: [facility];  Service: Urology;  Laterality: Right;  . CYSTOSCOPY WITH RETROGRADE PYELOGRAM, URETEROSCOPY AND STENT PLACEMENT Right 01/23/2019   Procedure: CYSTOSCOPY WITH RIGHT RETROGRADE PYELOGRAM, URETEROSCOPY AND STENT PLACEMENT;  Surgeon: Irine Seal, MD;  Location: [facility];  Service: Urology;  Laterality: Right;  . CYSTOSCOPY WITH URETEROSCOPY AND STENT PLACEMENT Right 04/23/2015   Procedure: CYSTOSCOPY WITH URETEROSCOPY RETROGRADE PYELOGRAM AND STENT PLACEMENT;  Surgeon: Cleon Gustin, MD;  Location: [facility];  Service: Urology;  Laterality: Right;  . CYSTOSCOPY/RETROGRADE/URETEROSCOPY/STONE EXTRACTION WITH BASKET Right 05/12/2015   Procedure: CYSTOSCOPY/RETROGRADE/URETEROSCOPY/STONE  EXTRACTION WITH BASKET;  Surgeon: Cleon Gustin, MD;  Location: [facility];  Service: Urology;  Laterality: Right;  . CYSTOSCOPY/URETEROSCOPY/HOLMIUM LASER/STENT  PLACEMENT Left 09/18/2018   Procedure: CYSTOSCOPY/ LEFT RETROGRADE LEFT URETEROSCOPY [place] LASER/STENT PLACEMENT;  Surgeon: Irine Seal, MD;  Location: [facility];  Service: Urology;  Laterality: Left;  . CYSTOSCOPY/URETEROSCOPY/HOLMIUM LASER/STENT PLACEMENT Right 10/24/2019   Procedure: CYSTOSCOPY/URETEROSCOPY/HOLMIUM LASER/STENT PLACEMENT;  Surgeon: Irine Seal, MD;  Location: [facility];  Service: Urology;  Laterality: Right;  . DENTAL SURGERY    . EXTRACORPOREAL SHOCK WAVE LITHOTRIPSY  x2 in  2006//   x2  in 2012  . HOLMIUM LASER APPLICATION Right 76/19/5093   Procedure: HOLMIUM LASER APPLICATION;  Surgeon: Cleon Gustin, MD;  Location: [facility];  Service: Urology;  Laterality: Right;  . HOLMIUM LASER APPLICATION Right 08/23/7122   Procedure: HOLMIUM LASER APPLICATION;  Surgeon: Irine Seal, MD;  Location: [facility];  Service: Urology;  Laterality: Right;  . IR URETERAL STENT RIGHT NEW ACCESS W/O SEP NEPHROSTOMY CATH  11/25/2019  . LAPAROSCOPIC APPENDECTOMY N/A 11/07/2012   Procedure: APPENDECTOMY LAPAROSCOPIC;  Surgeon: Harl Bowie, MD;  Location: [place];  Service: General;  Laterality: N/A;  . NEPHROLITHOTOMY Left 08/06/2015   Procedure: 1ST STAGE  LEFT PERCUTANEOUS NEPHROLITHOTOMY ;  Surgeon: Irine Seal, MD;  Location: [facility];  Service: Urology;  Laterality: Left;  . NEPHROLITHOTOMY Right 11/26/2019   Procedure: NEPHROLITHOTOMY PERCUTANEOUS;  Surgeon: Irine Seal, MD;  Location: [facility];  Service: Urology;  Laterality: Right;  . NEPHROLITHOTOMY Right 11/28/2019   Procedure: NEPHROLITHOTOMY PERCUTANEOUS SECOND LOOK;  Surgeon: Alexis Frock, MD;  Location: [facility];  Service: Urology;  Laterality: Right;  . TONSILLECTOMY  1975  . TRANSURETHRAL RESECTION OF BLADDER TUMOR  1998    FAMILY HISTORY: family history includes Cancer in an other family member; Diabetes in an other family member; Hypertension in an other family member.  SOCIAL HISTORY:  reports that  he has been smoking cigarettes. He has a 15.00 pack-year smoking history. He has never used smokeless tobacco. He reports previous alcohol use of about 5.0 standard drinks of alcohol per week. He reports that he does not use drugs.  ALLERGIES: Altace [ramipril], Crestor [rosuvastatin], Lipitor [atorvastatin], Contrast media [iodinated diagnostic agents], Dilaudid [hydromorphone], Iodine, Latex, Oxycodone, Adhesive [tape], and Peanut-containing drug products  MEDICATIONS:  Current Outpatient Medications  Medication Sig Dispense Refill  . aspirin EC 81 MG tablet Take 81 mg by mouth daily.    . candesartan (ATACAND) 16 MG tablet Take 1 tablet (16 mg total) by mouth daily. 90 tablet 1  . Cholecalciferol (VITAMIN D) 2000 units tablet Take 2,000 Units by mouth daily.    . Ciclopirox 0.77 % gel APPLY TO AFFECTED AREA TWICE A DAY 30 g 2  . Continuous Blood Gluc Receiver ([place]) DEVI See admin instructions.    . Continuous Blood Gluc Sensor (DEXCOM G6 SENSOR) MISC change sensor    . Continuous Blood Gluc Transmit (DEXCOM G6 TRANSMITTER) MISC See admin instructions.    Marland Kitchen ezetimibe (ZETIA) 10 MG tablet TAKE 1 TABLET EVERY DAY 90 tablet 1  . fluticasone (FLONASE) 50 MCG/ACT nasal spray Place 2 sprays into both nostrils daily. (Patient taking differently: Place 2 sprays into both nostrils daily as needed for allergies.) 16 g 3  . Glucagon (GVOKE HYPOPEN 2-PACK) 1 MG/0.2ML SOAJ Inject 1 Act into the skin daily as needed. 2 mL 5  . glucose blood (ONETOUCH VERIO) test strip 5X A  DAY    . indapamide (LOZOL) 1.25 MG tablet TAKE 1 TABLET BY MOUTH DAILY. 90 tablet 0  . Insulin Glargine (LANTUS SOLOSTAR [place]) Inject 15 Units into the skin 2 (two) times daily as needed. Only uses when pump goes out    . Insulin Human (INSULIN PUMP) SOLN Inject 1 each into the skin continuous. Novolog insulin    . levocetirizine (XYZAL) 5 MG tablet TAKE 1 TABLET (5 MG TOTAL) BY MOUTH DAILY AS NEEDED FOR ALLERGIES. 90 tablet  1  . NOVOLOG FLEXPEN 100 UNIT/ML FlexPen Inject into the skin.    Vladimir Faster Glycol-Propyl Glycol (SYSTANE OP) Place 1 drop into both eyes every 8 (eight) hours as needed (dry eyes).    . sildenafil (REVATIO) 20 MG tablet Take 20 mg by mouth daily as needed (erectile dysfunction).     No current facility-administered medications for this encounter.    REVIEW OF SYSTEMS:  On review of systems, the patient reports that he is doing well overall. He denies any chest pain, shortness of breath, cough, fevers, chills, night sweats, unintended weight changes. He denies any bowel disturbances, and denies abdominal pain, nausea or vomiting. He denies any new musculoskeletal or joint aches or pains. His IPSS was 8, indicating moderate urinary symptoms. His SHIM was 7, indicating he has severe erectile dysfunction. A complete review of systems is obtained and is otherwise negative.   PHYSICAL EXAM:  Wt Readings from Last 3 Encounters:  10/23/20 250 lb 12.8 oz (113.8 kg)  09/11/20 253 lb (114.8 kg)  07/28/20 244 lb 6.4 oz (110.9 kg)   Temp Readings from Last 3 Encounters:  09/11/20 98.2 F (36.8 C) (Oral)  07/28/20 98.3 F (36.8 C) (Oral)  01/13/20 97.6 F (36.4 C) (Oral)   BP Readings from Last 3 Encounters:  10/23/20 139/71  09/11/20 136/76  07/28/20 136/76   Pulse Readings from Last 3 Encounters:  10/23/20 79  09/11/20 79  07/28/20 77   Pain Assessment Pain Score: 0-No pain/10  In general this is a well appearing African American male in no acute distress. He's alert and oriented x4 and appropriate throughout the examination. Cardiopulmonary assessment is negative for acute distress and he exhibits normal effort.   KPS = 100  100 - Normal; no complaints; no evidence of disease. 90   - Able to carry on normal activity; minor signs or symptoms of disease. 80   - Normal activity with effort; some signs or symptoms of disease. 48   - Cares for self; unable to carry on normal activity  or to do active work. 60   - Requires occasional assistance, but is able to care for most of his personal needs. 50   - Requires considerable assistance and frequent medical care. 60   - Disabled; requires special care and assistance. 34   - Severely disabled; hospital admission is indicated although death not imminent. 3   - Very sick; hospital admission necessary; active supportive treatment necessary. 10   - Moribund; fatal processes progressing rapidly. 0     - Dead  Karnofsky DA, Abelmann Cloud, Craver LS and Burchenal [facility] [phone]) The use of the nitrogen mustards in the palliative treatment of carcinoma: with particular reference to bronchogenic carcinoma Cancer 1 634-56   LABORATORY DATA:  Lab Results  Component Value Date   WBC 10.2 07/28/2020   HGB 14.7 07/28/2020   HCT 43.8 07/28/2020   MCV 90.0 07/28/2020   PLT 179.0 07/28/2020   Lab Results  Component  Value Date   NA 138 07/28/2020   K 3.7 07/28/2020   CL 101 07/28/2020   CO2 29 07/28/2020   Lab Results  Component Value Date   ALT 18 07/28/2020   AST 16 07/28/2020   ALKPHOS 97 07/28/2020   BILITOT 0.6 07/28/2020     RADIOGRAPHY: MR Brain Wo Contrast  Result Date: 10/05/2020 CLINICAL DATA:  Ataxia EXAM: MRI HEAD WITHOUT CONTRAST TECHNIQUE: Multiplanar, multiecho pulse sequences of the brain and surrounding structures were obtained without intravenous contrast. COMPARISON:  None. FINDINGS: Brain: There is no acute infarction or intracranial hemorrhage. There is no intracranial mass, mass effect, or edema. There is no hydrocephalus or extra-axial fluid collection. Prominence of the ventricles and sulci reflects generalized parenchymal volume loss. Few scattered small foci of T2 hyperintensity in the supratentorial and pontine white matter are nonspecific but may reflect mild chronic microvascular ischemic changes. Vascular: Major vessel flow voids at the skull base are preserved. Skull and upper cervical spine: Normal marrow  signal is preserved. Sinuses/Orbits: Mild mucosal thickening.  Orbits are unremarkable. Other: Sella is unremarkable.  Mastoid air cells are clear. IMPRESSION: No evidence of recent infarction hemorrhage, or mass. Mild chronic microvascular ischemic changes. Electronically Signed   By: Macy Mis M.D.   On: 10/05/2020 17:02   MR Cervical Spine Wo Contrast  Result Date: 10/06/2020 CLINICAL DATA:  70 year old male with ataxia. Off balance for 1.5 months. EXAM: MRI CERVICAL SPINE WITHOUT CONTRAST TECHNIQUE: Multiplanar, multisequence MR imaging of the cervical spine was performed. No intravenous contrast was administered. COMPARISON:  Brain MRI from the same day reported separately. Cervical spine MRI 04/02/2018. FINDINGS: Alignment: Stable straightening of cervical lordosis from 2019. No spondylolisthesis. Vertebrae: Small area of patchy degenerative appearing marrow edema in the posterosuperior endplate of C5 on the right (series 6, image 8). Other chronic degenerative endplate marrow signal changes with normal background bone marrow signal. No other marrow edema or acute osseous abnormality. Cord: No cord signal abnormality despite degenerative cord mass effect detailed below. Negative visible upper thoracic cord. Posterior Fossa, vertebral arteries, paraspinal tissues: Cervicomedullary junction is within normal limits. Preserved major vascular flow voids in the neck. Dominant right vertebral artery as in 2019. There is a small round 7 mm retention cyst of the soft palate just above the uvula (series 6, image 9) which is stable since 2019 and benign. Negative visible other neck soft tissues, lung apices. Disc levels: C2-C3:  Mild ligament flavum hypertrophy.  No stenosis. C3-C4: Disc space loss. Circumferential disc bulge and endplate spurring with broad-based posterior component. Mild facet and ligament flavum hypertrophy. Mild spinal stenosis and spinal cord mass effect. Moderate to severe left greater  than right C4 foraminal stenosis. This level is mildly progressed since 2019. C4-C5: Disc space loss with circumferential disc bulge and endplate spurring but new broad-based central disc protrusion since 2019 on series 8, image 26. Increased spinal stenosis and spinal cord mass effect. Mild facet and ligament flavum hypertrophy. Moderate left and moderate to severe right C5 foraminal stenosis, increased on the right. C5-C6: Circumferential disc bulge and endplate spurring eccentric to the right. Borderline to mild spinal stenosis. No significant foraminal stenosis. This level is stable. C6-C7: Left eccentric circumferential disc bulge and endplate spurring. No significant spinal stenosis. Mild left C7 foraminal stenosis. This level is stable. C7-T1: Broad-based right paracentral and foraminal disc with endplate spurring (series 8, image 40. Mild facet and ligament flavum hypertrophy. No spinal stenosis. Mild to moderate right C8 foraminal stenosis appears  stable. No visible upper thoracic stenosis. IMPRESSION: 1. Progressed disc and endplate degeneration at C4-C5 since a 2019 MRI with increased spinal stenosis and spinal cord mass effect there. Associated C5 degenerative endplate marrow edema. Moderate to severe C5 neural foraminal stenosis also appears increased on the right. 2. Progressed C3-C4 degeneration along with multifactorial mild spinal stenosis and mild cord mass effect, moderate to severe C4 foraminal stenosis greater on the left. 3. No cervical spinal cord signal abnormality. Degeneration and stenosis elsewhere is stable since 2019. Electronically Signed   By: Genevie Ann M.D.   On: 10/06/2020 05:38      IMPRESSION/PLAN: 70 y.o. gentleman with Stage T1c adenocarcinoma of the prostate with a Gleason score of 4+3 and a PSA of 5.98.    We discussed the patient's workup and outlined the nature of prostate cancer in this setting. The patient's T stage, Gleason's score, and PSA put him into the unfavorable  intermediate risk group. Accordingly, he is eligible for a variety of potential treatment options including brachytherapy, 5.5 weeks of external radiation, or prostatectomy. We discussed the available radiation techniques, and focused on the details and logistics of delivery. We discussed and outlined the risks, benefits, short and long-term effects associated with radiotherapy and compared and contrasted these with prostatectomy. We discussed the role of SpaceOAR gel in reducing the rectal toxicity associated with radiotherapy. He appears to have a good understanding of his disease and our treatment recommendations which are of curative intent.  He was encouraged to ask questions that were answered to his stated satisfaction.  At the end of the conversation, the patient remains undecided regarding his treatment preference but is interested in getting a second opinion with Dr. Darcus Austin at [facility] before making his final decision. We will share our discussion with Dr. Jeffie Pollock and look forward to following along in the care of this very nice gentleman. Of course, we would be more than happy to continue to participate in his care should he ultimately elect to proceed with radiotherapy locally in [place], [place].     Nicholos Johns, PA-C    Tyler Pita, MD  [facility] Direct Dial: [phone]  Fax: [phone] [email or web address]  Skype  [email or web address]   This document serves as a record of services personally performed by Tyler Pita, MD and Freeman Caldron, PA-C. It was created on their behalf by Wilburn Mylar, a trained medical scribe. The creation of this record is based on the scribe's personal observations and the provider's statements to them. This document has been checked and approved by the attending provider.

## 2020-10-23 NOTE — Consult Note (Signed)
[facility]     10/23/2020   --------------------------------------------------------------------------------   Anthony Daniel  MRN: [id number]  DOB: 05/13/1951, 70 year old Male  SSN: [id number]   PRIMARY CARE:  Janith Lima, MD  REFERRING:  Irine Seal, MD  PROVIDER:  Irine Seal, M.D.  TREATING:  Raynelle Bring, M.D.  LOCATION:  [facility] [phone]     --------------------------------------------------------------------------------   CC/HPI: CC: Prostate Cancer   Physician requesting consult: Dr. Irine Seal  PCP: Dr. Scarlette Calico  Location of consult: [facility]   Anthony Daniel is a 70 year old gentleman with a past medical history significant for hypertension, diabetes, hyperlipidemia, urolithiasis, and bladder cancer. His bladder cancer was presumably non-muscle invasive initially diagnosed in 1998 and he has been disease free since then with his last cystoscopy in May of 2021. He has a family history of prostate cancer with his father having died of metastatic prostate cancer. He has been undergoing routine PSA screening and had a negative prostate biopsy in 2015.   In December 2019, his PSA increased to 4.14 prompting another TRUS biopsy on 08/22/18 by Dr. Jeffie Pollock. This demonstrated Gleason 3+3=6 adenocarcinoma in 5% of one biopsy core. He proceeded with active surveillance. An MRI of the prostate was performed in April 2021 and was unremarkable for any suspicious lesions. His surveillance follow up was complicated by multiple kidney stone procedures in [place] and at [place]. A repeat confirmatory biopsy was finally performed on 09/23/20. His PSA at this time was 5.98. This biopsy indicated upgraded Gleason 4+3=7 adenocarcinom with 4 out of 12 biopsy cores positive for malignancy.   Family history: Father died of metastatic prostate cancer.   Imaging studies:   PMH: He has a history of  diabetes, hypertension, hyperlipidemia, urolithiasis, and bladder cancer. He has multiple allergies listed including LATEX. However, his other "allergies" to narcotic medications and contrast are GI upset rather than true allergies.  PSH: No abdominal surgeries.   TNM stage: cT1c Nx Mx  PSA: 5.98  Gleason score: 4+3=7 (GG 3)  Biopsy (09/23/20): 4/12 cores positive  Left: L lateral mid (5%, 4+3=7)  Right: R apex (10%, 4+3=7), R mid (5%, 3+3=6), R lateral mid (10%, 3+4=7)  Prostate volume: 41 cc   Nomogram  OC disease: 47%  EPE: 49%  SVI: 8%  LNI: 8%  PFS (5 year, 10 year): 67%, 52%   Urinary function: He denies significant voiding symptoms.  Erectile function: He does have severe refractory erectile dysfunction. He does use intracavernosal injection therapy for treatment.     ALLERGIES: Contrast Media Ready-Box MISC - Nausea Dilaudid Iodine - Nausea Latex Gloves MISC - Skin Rash oxycodone - Nausea rosuvastatin - cramping Statins     MEDICATIONS: Aspirin  Tamsulosin Hcl 0.4 mg capsule 1 capsule PO Daily  Ciclopirox  Ecotrin 325 mg tablet, delayed release Oral  Fluticasone Propionate 50 mcg/actuation spray, suspension Nasal  HumaLOG 100 UNIT/ML Subcutaneous Solution Subcutaneous  Indapamide 1.25 mg tablet 1 tablet PO Daily  Irbesartan  Lantus  Levofloxacin 750 mg tablet 1 po 1 hour prior to the procedure  Livalo  Metaxalone  Novolog  Sildenafil Citrate 20 mg tablet 1-5 tablets as needed  Systane Ultra SOLN Ophthalmic  Tramadol Hcl  Xyzal  Zetia 10 mg tablet Oral     GU PSH: Cysto Remove Stent FB Sim - 12/03/2019, 01/30/2019 Endoscopy Ureter, Right - 11/28/2019 ESWL - 2012, 2012, 2008 PCNL, Right - 11/26/2019  Percut Stone Removal >2cm - 2017 Prostate Needle Biopsy - 09/23/2020, 2020 Stone Removal Nephrost Tube, Right - 11/28/2019 Ureteroscopic laser litho, Right - 10/24/2019, Right - 01/23/2019, Left - 2020, 2016       PSH Notes: Dental Surgery, Percutaneous Lithotomy For  Stone Over 2cm., Cystoscopy Ureteroscopy Lithotripsy Incl Insert Indwelling Ureter Stent, Preventive medication therapy needed, Lithotripsy, Lithotripsy, Lithotripsy, Cystoscopy Bladder Tumor   NON-GU PSH: Cystourethroscopy, With Ureteroscopy And/or Pyeloscopy; With Endoscopic Laser Treatment Of Ureteral NJX PX ANTEGRDE NFROSGRM &/URTRGRM EXSTNG ACESS, Right - 11/28/2019 Surgical Pathology, Gross And Microscopic Examination For Prostate Needle - 09/23/2020, 2020     GU PMH: Prostate Cancer, He has T1c Nx Mx Gleason 7(4+3) prostate cancer with grade progression on surveillance. He has BPH with moderate LUTS and ED. He has a history of superficial bladder cancer as well. I discussed the options for active treatment since he has progressed on surveillance. I reviewed the risks and benefits of RALP, EXRT (I mentioned several forms) Brachytherapy ( both permanent and HDR options) Cryotherapy and HIfU and briefly discussed ADT in association with EXRT and the side effects of that therapy. He is leaning toward radiation therapy option and we did discuss with increased risks of secondary malignancy, particularly in the face of his history of bladder cancer. I discussed the use of SpaceOAR and the associated risks and benefits. He is schedule for an appointment with the [facility] on 10/23/20. He is on the borderline for needing staging studies with his unfavorable intermediate risk but low volume disease and LNI probability of only 8% so I will defer any decisions regarding staging studies to the [facility]. He had questions about an outside second opinioin and I suggested [facility] since he is already established with Dr. Kandee Keen at [facility] for his history of stones. - 10/13/2020, I will call with the results. , - 09/23/2020, His PSA is stable but he is over 18 months out from his last biopsy so I will get him set up for a surveillance biopsy and will send levaquin and have him get rocephin as well. Risks of bleeding, infection and  voiding difficulty reviewed. , - 04/06/2020, - 10/24/2019, His PSA is up to 6.08. I am going to repeat the PSA in 3 months and if it continues to rise, I will get him set up for a surveillance biopsy. He had very low risk disease on his biopsy a year ago. , - 08/26/2019, He has very low risk disease and his PSA is coming back down. Repeat PSA and exam in 6 months. , - 03/06/2019, His PSA jumped but that is most consistent with a reaction to the biopsy. I will repeat in 3 months. , - 2020, He has very low risk, Gleason 6(3+3) T1c Nx Mx prostate cancer with BPH with moderate LUTS and ED. I discussed Active surveillance, RALP, EXRT and Seeds and briefly mentioned cryotherapy and HIFU. He is an excellent candidate for active surveillance and would like to pursue that option for the time being. , - 2020 History of urolithiasis - 09/23/2020, He is doing well since his last procedure but did pass a small stone today. I have refilled the potassium citrate. , - 04/06/2020 Microscopic hematuria, He has 3-10 RBC's and a possible urethral stone on [place]. He was told to keep an eye out. - 09/23/2020, Asymptomatic microscopic hematuria, - 2016 BPH w/LUTS, His voiding symptoms have improved with stone removal. he will continue the tamsulosin. - 04/06/2020, Benign prostatic hyperplasia with urinary obstruction, -  2017 Elevated PSA - 04/06/2020, - 2020, His PSA continues to rise slowly. I will repeat this again in 3-4 months and if it continues to rise we will need to consider a repeat biopsy., - 06/27/2018, His PSA is down a bit. I will repeat in 6 months. , - 2019, His PSA is minimally changed. I will have him return in 3 months with another PSA with a week of abstinence since he didn't do that this time. , - 2019, His PSA is up a bit more. THE MRIP was negative earlier this year and he had a negative biopsy in 2015. I will repeat a PSA in 3 months with a week of abstinence. , - 2018 (Worsening), His PSA is up again with a low f/t ratio. I am  going to get an MRI and will do a fusion biopsy if positive. If negative, he will return in 6 months with a PSA. , - 2018 (Stable), His PSA has come down from the 4 level. His PSADT is about 4 years. , - 2017, Elevated prostate specific antigen (PSA), - 4332 Renal colic - 9/51/8841 Renal calculus - November 09, 2019, - 11-02-19, He has passed several stones since his last visit and the stone burden appears reduced on KUB today. He is on indapamide from his PCP for his HTN and that should help with the stones as well. He will return in 6 months with a KUB. , - 08/26/2019, He has reduced stone burden but is not completely free of stones. He will continue the potassium citrate and I reviewed the dietary restrictions with him as well. BMP and KUB in 6 months. , - 03/06/2019, He has no hydro but has bilateral residual stone disease. I am going to have him return in 3 months with a KUB and we will discuss ESWL for the RLP stone at that time. , - 2020, Nephrolithiasis, - 2017, Kidney stone on left side, - 2017, Kidney stone on right side, - 2016 Ureteral calculus - 02-Nov-2019 ED due to arterial insufficiency, Erectile dysfunction due to arterial insufficiency - 2017 Weak Urinary Stream, Weak urinary stream - 2017 Other microscopic hematuria, Microscopic hematuria - 2016 History of bladder cancer, Bladder Cancer - 2014      PMH Notes:  2008-06-06 12:27:08 - Note: Nephrolithiasis Of The Left Kidney   NON-GU PMH: Tinea cruris, HE has a new complaint of a groin rash that is probably candidal with his history of diabetes. Nystop sent to the pharmacy. - 2018 Encounter for general adult medical examination without abnormal findings, Encounter for preventive health examination - 2017 Personal history of other diseases of the circulatory system, History of hypertension - 2014 Personal history of other endocrine, nutritional and metabolic disease, History of hypercholesterolemia - 2014, History of type 2 diabetes mellitus, -  2014    FAMILY HISTORY: Atrial Fibrillation - Father Cardiac Failure - Father Family Health Status Number - Runs In Family    Notes: CaP father    SOCIAL HISTORY: Marital Status: Married Preferred Language: English; Race: Black or African American Current Smoking Status: Patient smokes. Has smoked since 03/18/1986. Smokes 1/2 pack per day.  Does not use smokeless tobacco. Social Drinker.  Does not use drugs. Drinks 2 caffeinated drinks per day. Has not had a blood transfusion. Patient's occupation is/was Retired.     Notes: ETOH maybe once per week   REVIEW OF SYSTEMS:    GU Review Male:   Patient denies frequent urination, hard to postpone urination, burning/ pain with  urination, get up at night to urinate, leakage of urine, stream starts and stops, trouble starting your streams, and have to strain to urinate .  Gastrointestinal (Lower):   Patient denies diarrhea and constipation.  Gastrointestinal (Upper):   Patient denies nausea and vomiting.  Constitutional:   Patient denies fever, night sweats, weight loss, and fatigue.  Skin:   Patient denies skin rash/ lesion and itching.  Eyes:   Patient denies blurred vision and double vision.  Ears/ Nose/ Throat:   Patient denies sore throat and sinus problems.  Hematologic/Lymphatic:   Patient denies swollen glands and easy bruising.  Cardiovascular:   Patient denies leg swelling and chest pains.  Respiratory:   Patient denies cough and shortness of breath.  Endocrine:   Patient denies excessive thirst.  Musculoskeletal:   Patient denies back pain and joint pain.  Neurological:   Patient denies headaches and dizziness.  Psychologic:   Patient denies depression and anxiety.   VITAL SIGNS: None   MULTI-SYSTEM PHYSICAL EXAMINATION:    Constitutional: Well-nourished. No physical deformities. Normally developed. Good grooming.  Neck: Neck symmetrical, not swollen. Normal tracheal position.  Respiratory: No labored breathing, no use of  accessory muscles.   Cardiovascular: Normal temperature, normal extremity pulses, no swelling, no varicosities.  Lymphatic: No enlargement of neck, axillae, groin.  Skin: No paleness, no jaundice, no cyanosis. No lesion, no ulcer, no rash.  Neurologic / Psychiatric: Oriented to time, oriented to place, oriented to person. No depression, no anxiety, no agitation.  Gastrointestinal: No mass, no tenderness, no rigidity, non obese abdomen.  Eyes: Normal conjunctivae. Normal eyelids.  Ears, Nose, Mouth, and Throat: Left ear no scars, no lesions, no masses. Right ear no scars, no lesions, no masses. Nose no scars, no lesions, no masses. Normal hearing. Normal lips.  Musculoskeletal: Normal gait and station of head and neck.     Complexity of Data:  Lab Test Review:   PSA  Records Review:   Pathology Reports, Previous Patient Records   03/30/20 01/14/20 08/21/19 02/27/19 10/15/18 06/22/18 12/21/17 09/15/17  PSA  Total PSA 5.98 ng/mL 5.66 ng/mL 6.08 ng/mL 4.89 ng/mL 5.32 ng/mL 4.14 ng/mL 3.63 ng/mL 3.82 ng/mL  Free PSA 0.30 ng/mL 0.29 ng/mL    0.32 ng/mL    % Free PSA 5 % PSA 5 % PSA    8 % PSA      08/23/06 09/09/04  Hormones  Testosterone, Total 3.65  4.19    Notes:                     A a   PROCEDURES: None   ASSESSMENT:      ICD-10 Details  1 GU:   Prostate Cancer - C61    PLAN:           Document Letter(s):  Created for Patient: Clinical Summary         Notes:   1. Unfavorable intermediate risk prostate cancer: I had a detailed discussion with Mr. Svec and his son today regarding his prostate cancer situation. Considering his upgraded disease, it was recommended that he proceed with therapy of curative intent taking into account his disease classification and his overall life expectancy. The patient was counseled about the natural history of prostate cancer and the standard treatment options that are available for prostate cancer. It was explained to him how his age and life  expectancy, clinical stage, Gleason score, and PSA affect his prognosis, the decision to proceed with additional staging studies,  as well as how that information influences recommended treatment strategies. We discussed the roles for active surveillance, radiation therapy, surgical therapy, androgen deprivation, as well as ablative therapy options for the treatment of prostate cancer as appropriate to his individual cancer situation. We discussed the risks and benefits of these options with regard to their impact on cancer control and also in terms of potential adverse events, complications, and impact on quality of life particularly related to urinary and sexual function. The patient was encouraged to ask questions throughout the discussion today and all questions were answered to his stated satisfaction. In addition, the patient was provided with and/or directed to appropriate resources and literature for further education about prostate cancer and treatment options.   He currently is a little overwhelmed as his mother unfortunately passed away earlier this week. He is scheduled to see both Dr. Tammi Klippel and Dr. Alen Blew later today for further discussion. He also is planning on proceeding with a 2nd opinion at [facility] in the near future. He also is interested in and will receive a referral for genetic counseling and to consider genetic testing considering his father's history of metastatic disease. He is going to consider his options and will be back in touch with Dr. Jeffie Pollock. He apparently is scheduled to have further follow-up regarding his kidney stones later this spring and I encouraged him to do that. He also would be due for surveillance cystoscopy of his bladder cancer in May and I encouraged him to proceed with that as well.   Cc: Dr. Scarlette Calico  Dr. Irine Seal  Dr. Zola Button  Dr. Tyler Pita    E & M CODES: We spent 66 minutes dedicated to evaluation and management time, including face to face  interaction, discussions on coordination of care, documentation, result review, and discussion with others as applicable.

## 2020-10-23 NOTE — Progress Notes (Signed)
Reason for the request:    Prostate cancer  HPI: I was asked by Dr. Jeffie Pollock to evaluate [name] the evaluation of prostate cancer.  He is a 70 year old man who was diagnosed with prostate cancer dating back to February 2020.  At that time he was found to have an elevated PSA of 5.32 and subsequently underwent a biopsy which showed a Gleason score 3+3 = 6 in 1 core.  He remained on active surveillance at this time and his recent PSA in September 2021 was 5.98 and a repeat biopsy in March 2022 was completed.  The pathology revealed Gleason score of 4+3 equal 7 in 2 cores and 3+3 = 6 in 1 core.  He did have an MRI of the prostate 26 October 2019 which did not show any evidence of high risk features.  He does have history of BPH currently on tamsulosin with improvement in his urinary symptoms.  He denies any hematuria, nocturia or dysuria.  He denies any bone pain or pathological fractures.  He remains reasonably active.  He does not report any headaches, blurry vision, syncope or seizures. Does not report any fevers, chills or sweats.  Does not report any cough, wheezing or hemoptysis.  Does not report any chest pain, palpitation, orthopnea or leg edema.  Does not report any nausea, vomiting or abdominal pain.  Does not report any constipation or diarrhea.  Does not report any skeletal complaints.    Does not report frequency, urgency or hematuria.  Does not report any skin rashes or lesions. Does not report any heat or cold intolerance.  Does not report any lymphadenopathy or petechiae.  Does not report any anxiety or depression.  Remaining review of systems is negative.    Past Medical History:  Diagnosis Date  . Arthritis    Back  . Cancer ([place])    bladder  . CKD (chronic kidney disease), stage I   . History of bladder cancer    1998--  TCC  . History of kidney stones   . Hyperlipidemia   . Hypertension   . Insulin pump in place   . LADA (latent autoimmune diabetes in adults), managed as type 1  [facility])    endocrinologist-- dr Buddy Duty  . Nephrolithiasis    bilateral --  nonobstructive per CT  . PONV (postoperative nausea and vomiting)   . Prostate cancer ([place])   . Spinal stenosis, cervical region   :  Past Surgical History:  Procedure Laterality Date  . COLONOSCOPY  2018   x2  . CYSTOSCOPY W/ URETERAL STENT PLACEMENT Right 05/12/2015   Procedure: CYSTOSCOPY WITH STENT REPLACEMENT;  Surgeon: Cleon Gustin, MD;  Location: [facility];  Service: Urology;  Laterality: Right;  . CYSTOSCOPY WITH RETROGRADE PYELOGRAM, URETEROSCOPY AND STENT PLACEMENT Right 01/23/2019   Procedure: CYSTOSCOPY WITH RIGHT RETROGRADE PYELOGRAM, URETEROSCOPY AND STENT PLACEMENT;  Surgeon: Irine Seal, MD;  Location: [facility];  Service: Urology;  Laterality: Right;  . CYSTOSCOPY WITH URETEROSCOPY AND STENT PLACEMENT Right 04/23/2015   Procedure: CYSTOSCOPY WITH URETEROSCOPY RETROGRADE PYELOGRAM AND STENT PLACEMENT;  Surgeon: Cleon Gustin, MD;  Location: [facility];  Service: Urology;  Laterality: Right;  . CYSTOSCOPY/RETROGRADE/URETEROSCOPY/STONE EXTRACTION WITH BASKET Right 05/12/2015   Procedure: CYSTOSCOPY/RETROGRADE/URETEROSCOPY/STONE EXTRACTION WITH BASKET;  Surgeon: Cleon Gustin, MD;  Location[facility];  Service: Urology;  Laterality: Right;  . CYSTOSCOPY/URETEROSCOPY/HOLMIUM LASER/STENT PLACEMENT Left 09/18/2018   Procedure: CYSTOSCOPY/ LEFT RETROGRADE LEFT URETEROSCOPY [place] LASER/STENT PLACEMENT;  Surgeon: Irine Seal, MD;  Location: [place];  Service: Urology;  Laterality: Left;  . CYSTOSCOPY/URETEROSCOPY/HOLMIUM LASER/STENT PLACEMENT Right 10/24/2019   Procedure: CYSTOSCOPY/URETEROSCOPY/HOLMIUM LASER/STENT PLACEMENT;  Surgeon: Irine Seal, MD;  Location: [facility];  Service: Urology;  Laterality: Right;  . DENTAL SURGERY    . EXTRACORPOREAL SHOCK WAVE LITHOTRIPSY  x2 in  2006//   x2  in 2012  . HOLMIUM LASER APPLICATION Right [id number]/8101    Procedure: HOLMIUM LASER APPLICATION;  Surgeon: Cleon Gustin, MD;  Location: [facility];  Service: Urology;  Laterality: Right;  . HOLMIUM LASER APPLICATION Right 01/20/1024   Procedure: HOLMIUM LASER APPLICATION;  Surgeon: Irine Seal, MD;  Location: [facility];  Service: Urology;  Laterality: Right;  . IR URETERAL STENT RIGHT NEW ACCESS W/O SEP NEPHROSTOMY CATH  11/25/2019  . LAPAROSCOPIC APPENDECTOMY N/A 11/07/2012   Procedure: APPENDECTOMY LAPAROSCOPIC;  Surgeon: Harl Bowie, MD;  Location: [place];  Service: General;  Laterality: N/A;  . NEPHROLITHOTOMY Left 08/06/2015   Procedure: 1ST STAGE  LEFT PERCUTANEOUS NEPHROLITHOTOMY ;  Surgeon: Irine Seal, MD;  Location: [facility];  Service: Urology;  Laterality: Left;  . NEPHROLITHOTOMY Right 11/26/2019   Procedure: NEPHROLITHOTOMY PERCUTANEOUS;  Surgeon: Irine Seal, MD;  Location: [facility];  Service: Urology;  Laterality: Right;  . NEPHROLITHOTOMY Right 11/28/2019   Procedure: NEPHROLITHOTOMY PERCUTANEOUS SECOND LOOK;  Surgeon: Alexis Frock, MD;  Location: [facility];  Service: Urology;  Laterality: Right;  . TONSILLECTOMY  1975  . TRANSURETHRAL RESECTION OF BLADDER TUMOR  1998  :   Current Outpatient Medications:  .  aspirin EC 81 MG tablet, Take 81 mg by mouth daily., Disp: , Rfl:  .  candesartan (ATACAND) 16 MG tablet, Take 1 tablet (16 mg total) by mouth daily., Disp: 90 tablet, Rfl: 1 .  Cholecalciferol (VITAMIN D) 2000 units tablet, Take 2,000 Units by mouth daily., Disp: , Rfl:  .  Ciclopirox 0.77 % gel, APPLY TO AFFECTED AREA TWICE A DAY, Disp: 30 g, Rfl: 2 .  Continuous Blood Gluc Receiver ([place]) DEVI, See admin instructions., Disp: , Rfl:  .  Continuous Blood Gluc Sensor (DEXCOM G6 SENSOR) MISC, change sensor, Disp: , Rfl:  .  Continuous Blood Gluc Transmit (DEXCOM G6 TRANSMITTER) MISC, See admin instructions., Disp: , Rfl:  .  ezetimibe (ZETIA) 10 MG tablet, TAKE 1 TABLET EVERY DAY, Disp: 90 tablet, Rfl:  1 .  fluticasone (FLONASE) 50 MCG/ACT nasal spray, Place 2 sprays into both nostrils daily. (Patient taking differently: Place 2 sprays into both nostrils daily as needed for allergies.), Disp: 16 g, Rfl: 3 .  Glucagon (GVOKE HYPOPEN 2-PACK) 1 MG/0.2ML SOAJ, Inject 1 Act into the skin daily as needed., Disp: 2 mL, Rfl: 5 .  glucose blood (ONETOUCH VERIO) test strip, 5X A DAY, Disp: , Rfl:  .  indapamide (LOZOL) 1.25 MG tablet, TAKE 1 TABLET BY MOUTH DAILY., Disp: 90 tablet, Rfl: 0 .  Insulin Glargine (LANTUS SOLOSTAR [place]), Inject 15 Units into the skin 2 (two) times daily as needed. Only uses when pump goes out, Disp: , Rfl:  .  Insulin Human (INSULIN PUMP) SOLN, Inject 1 each into the skin continuous. Novolog insulin, Disp: , Rfl:  .  levocetirizine (XYZAL) 5 MG tablet, TAKE 1 TABLET (5 MG TOTAL) BY MOUTH DAILY AS NEEDED FOR ALLERGIES., Disp: 90 tablet, Rfl: 1 .  NOVOLOG FLEXPEN 100 UNIT/ML FlexPen, Inject into the skin., Disp: , Rfl:  .  Polyethyl Glycol-Propyl Glycol (SYSTANE OP), Place 1 drop into both  eyes every 8 (eight) hours as needed (dry eyes)., Disp: , Rfl:  .  sildenafil (REVATIO) 20 MG tablet, Take 20 mg by mouth daily as needed (erectile dysfunction)., Disp: , Rfl: :  Allergies  Allergen Reactions  . Altace [Ramipril] Cough  . Crestor [Rosuvastatin] Other (See Comments)    Muscle aches, cramps  . Lipitor [Atorvastatin] Other (See Comments)    Muscle aches, cramps  . Contrast Media [Iodinated Diagnostic Agents] Itching and Nausea And Vomiting    PT TRIED THIS TWICE.EVEN WITH PRE-MEDS AND STILL HAD NAUSEA AND ITCHING--WAS TOLD HE CAN NOT TAKE iv ANYMORE  . Dilaudid [Hydromorphone] Nausea And Vomiting  . Iodine     Betadine/iodine rash per patient  . Latex Other (See Comments)    Irritation  . Oxycodone Nausea Only    Pt can tolerate hydrocodone in small doses only!  . Adhesive [Tape] Rash  . Peanut-Containing Drug Products Anxiety and Other (See Comments)    Jittery   :  Family History  Problem Relation Age of Onset  . Cancer Other        Colon and Prostate Cancer  . Hypertension Other   . Diabetes Other   . Alcohol abuse Neg Hx   . Drug abuse Neg Hx   . Early death Neg Hx   . Heart disease Neg Hx   . Hyperlipidemia Neg Hx   . Kidney disease Neg Hx   . Stroke Neg Hx   :  Social History   Socioeconomic History  . Marital status: Married    Spouse name: Not on file  . Number of children: Not on file  . Years of education: Not on file  . Highest education level: Master's degree (e.g., MA, MS, MEng, MEd, MSW, MBA)  Occupational History  . Not on file  Tobacco Use  . Smoking status: Current Every Day Smoker    Packs/day: 0.50    Years: 30.00    Pack years: 15.00    Types: Cigarettes  . Smokeless tobacco: Never Used  Vaping Use  . Vaping Use: Never used  Substance and Sexual Activity  . Alcohol use: Not Currently    Alcohol/week: 5.0 standard drinks    Types: 5 Shots of liquor per week    Comment: occasional  . Drug use: No  . Sexual activity: Yes  Other Topics Concern  . Not on file  Social History Narrative  . Not on file   Social Determinants of Health   Financial Resource Strain: Low Risk   . Difficulty of Paying Living Expenses: Not hard at all  Food Insecurity: No Food Insecurity  . Worried About Charity fundraiser in the Last Year: Never true  . Ran Out of Food in the Last Year: Never true  Transportation Needs: No Transportation Needs  . Lack of Transportation (Medical): No  . Lack of Transportation (Non-Medical): No  Physical Activity: Sufficiently Active  . Days of Exercise per Week: 5 days  . Minutes of Exercise per Session: 30 min  Stress: No Stress Concern Present  . Feeling of Stress : Not at all  Social Connections: Socially Integrated  . Frequency of Communication with Friends and Family: More than three times a week  . Frequency of Social Gatherings with Friends and Family: More than three times a week   . Attends Religious Services: More than 4 times per year  . Active Member of Clubs or Organizations: Yes  . Attends Archivist Meetings: More than  4 times per year  . Marital Status: Married  Human resources officer Violence: Not on file  :    MR Brain Wo Contrast  Result Date: 10/05/2020 CLINICAL DATA:  Ataxia EXAM: MRI HEAD WITHOUT CONTRAST TECHNIQUE: Multiplanar, multiecho pulse sequences of the brain and surrounding structures were obtained without intravenous contrast. COMPARISON:  None. FINDINGS: Brain: There is no acute infarction or intracranial hemorrhage. There is no intracranial mass, mass effect, or edema. There is no hydrocephalus or extra-axial fluid collection. Prominence of the ventricles and sulci reflects generalized parenchymal volume loss. Few scattered small foci of T2 hyperintensity in the supratentorial and pontine white matter are nonspecific but may reflect mild chronic microvascular ischemic changes. Vascular: Major vessel flow voids at the skull base are preserved. Skull and upper cervical spine: Normal marrow signal is preserved. Sinuses/Orbits: Mild mucosal thickening.  Orbits are unremarkable. Other: Sella is unremarkable.  Mastoid air cells are clear. IMPRESSION: No evidence of recent infarction hemorrhage, or mass. Mild chronic microvascular ischemic changes. Electronically Signed   By: Macy Mis M.D.   On: 10/05/2020 17:02   MR Cervical Spine Wo Contrast  Result Date: 10/06/2020 CLINICAL DATA:  71 year old male with ataxia. Off balance for 1.5 months. EXAM: MRI CERVICAL SPINE WITHOUT CONTRAST TECHNIQUE: Multiplanar, multisequence MR imaging of the cervical spine was performed. No intravenous contrast was administered. COMPARISON:  Brain MRI from the same day reported separately. Cervical spine MRI 04/02/2018. FINDINGS: Alignment: Stable straightening of cervical lordosis from 2019. No spondylolisthesis. Vertebrae: Small area of patchy degenerative appearing  marrow edema in the posterosuperior endplate of C5 on the right (series 6, image 8). Other chronic degenerative endplate marrow signal changes with normal background bone marrow signal. No other marrow edema or acute osseous abnormality. Cord: No cord signal abnormality despite degenerative cord mass effect detailed below. Negative visible upper thoracic cord. Posterior Fossa, vertebral arteries, paraspinal tissues: Cervicomedullary junction is within normal limits. Preserved major vascular flow voids in the neck. Dominant right vertebral artery as in 2019. There is a small round 7 mm retention cyst of the soft palate just above the uvula (series 6, image 9) which is stable since 2019 and benign. Negative visible other neck soft tissues, lung apices. Disc levels: C2-C3:  Mild ligament flavum hypertrophy.  No stenosis. C3-C4: Disc space loss. Circumferential disc bulge and endplate spurring with broad-based posterior component. Mild facet and ligament flavum hypertrophy. Mild spinal stenosis and spinal cord mass effect. Moderate to severe left greater than right C4 foraminal stenosis. This level is mildly progressed since 2019. C4-C5: Disc space loss with circumferential disc bulge and endplate spurring but new broad-based central disc protrusion since 2019 on series 8, image 26. Increased spinal stenosis and spinal cord mass effect. Mild facet and ligament flavum hypertrophy. Moderate left and moderate to severe right C5 foraminal stenosis, increased on the right. C5-C6: Circumferential disc bulge and endplate spurring eccentric to the right. Borderline to mild spinal stenosis. No significant foraminal stenosis. This level is stable. C6-C7: Left eccentric circumferential disc bulge and endplate spurring. No significant spinal stenosis. Mild left C7 foraminal stenosis. This level is stable. C7-T1: Broad-based right paracentral and foraminal disc with endplate spurring (series 8, image 40. Mild facet and ligament  flavum hypertrophy. No spinal stenosis. Mild to moderate right C8 foraminal stenosis appears stable. No visible upper thoracic stenosis. IMPRESSION: 1. Progressed disc and endplate degeneration at C4-C5 since a 2019 MRI with increased spinal stenosis and spinal cord mass effect there. Associated C5 degenerative endplate marrow  edema. Moderate to severe C5 neural foraminal stenosis also appears increased on the right. 2. Progressed C3-C4 degeneration along with multifactorial mild spinal stenosis and mild cord mass effect, moderate to severe C4 foraminal stenosis greater on the left. 3. No cervical spinal cord signal abnormality. Degeneration and stenosis elsewhere is stable since 2019. Electronically Signed   By: Genevie Ann M.D.   On: 10/06/2020 05:38    Assessment and Plan:    70 year old man with prostate cancer diagnosed in February 2020 after presenting with a Gleason score 3+3 equal 6 and remained on active surveillance and in March 2022 showed a Gleason score 4+3 = 7 at least 2 cores.  His most recent PSA is 5.98.   His case was discussed today in the prostate cancer multidisciplinary clinic including review of his pathology results with the reviewing pathologist.  Treatment options including primary surgical therapy versus radiation therapy were discussed at this time.  Risks and benefits of both approaches as well as oncological outcome were reviewed.  The role for systemic therapy was also discussed including the role of androgen deprivation concomitant with radiation were reviewed.  At this time, it is reasonable to consider radiation without androgen deprivation he elected to go that route.  Primary surgical therapy could also bring high postoperative complications given his age but does not completely rule it out.  He is still I am not sure about which way to proceed and he is considering all options at this time. All his questions were answered today to his satisfaction.   30  minutes were  dedicated to this visit. The time was spent on reviewing laboratory data, imaging studies, discussing treatment options, discussing pathology results and answering questions regarding future plan.    A copy of this consult has been forwarded to the requesting physician.

## 2020-10-23 NOTE — Progress Notes (Signed)
[place] Psychosocial Distress Screening Spiritual Care  Met with Anthony Daniel and his son Anthony Daniel, who works for a Counsellor in [place], in [facility] to introduce [facility] team/resources, reviewing distress screen per protocol.  The patient scored a 1 on the Psychosocial Distress Thermometer which indicates mild distress. Also assessed for distress and other psychosocial needs.   ONCBCN DISTRESS SCREENING 10/23/2020  Screening Type Initial Screening  Distress experienced in past week (1-10) 1  Referral to support programs Yes   [name] is coping with the very recent death of his mother and reports minimal distress related to diagnosis. At this time his focus is on spending time with family, and he is aware of ongoing Support Team availability, should needs arise or circumstances change.   Follow up needed: No. Per [name], he plans to reach out as needed.   [place], [place], [facility] Pager [phone] Voicemail [phone]

## 2020-10-25 ENCOUNTER — Other Ambulatory Visit: Payer: Self-pay | Admitting: Internal Medicine

## 2020-10-25 DIAGNOSIS — I10 Essential (primary) hypertension: Secondary | ICD-10-CM

## 2020-10-25 DIAGNOSIS — E139 Other specified diabetes mellitus without complications: Secondary | ICD-10-CM

## 2020-11-03 DIAGNOSIS — E1165 Type 2 diabetes mellitus with hyperglycemia: Secondary | ICD-10-CM | POA: Diagnosis not present

## 2020-11-03 DIAGNOSIS — E103393 Type 1 diabetes mellitus with moderate nonproliferative diabetic retinopathy without macular edema, bilateral: Secondary | ICD-10-CM | POA: Diagnosis not present

## 2020-11-03 DIAGNOSIS — N182 Chronic kidney disease, stage 2 (mild): Secondary | ICD-10-CM | POA: Diagnosis not present

## 2020-11-03 DIAGNOSIS — Z720 Tobacco use: Secondary | ICD-10-CM | POA: Diagnosis not present

## 2020-11-03 DIAGNOSIS — Z794 Long term (current) use of insulin: Secondary | ICD-10-CM | POA: Diagnosis not present

## 2020-11-03 DIAGNOSIS — R7309 Other abnormal glucose: Secondary | ICD-10-CM | POA: Diagnosis not present

## 2020-11-03 DIAGNOSIS — E785 Hyperlipidemia, unspecified: Secondary | ICD-10-CM | POA: Diagnosis not present

## 2020-11-03 DIAGNOSIS — E1022 Type 1 diabetes mellitus with diabetic chronic kidney disease: Secondary | ICD-10-CM | POA: Diagnosis not present

## 2020-11-05 NOTE — Addendum Note (Signed)
Encounter addended by: Heywood Footman, RN on: 11/05/2020 3:16 PM  Actions taken: Flowsheet accepted

## 2020-11-16 DIAGNOSIS — I10 Essential (primary) hypertension: Secondary | ICD-10-CM | POA: Diagnosis not present

## 2020-11-16 DIAGNOSIS — M4722 Other spondylosis with radiculopathy, cervical region: Secondary | ICD-10-CM | POA: Diagnosis not present

## 2020-11-16 DIAGNOSIS — M5412 Radiculopathy, cervical region: Secondary | ICD-10-CM | POA: Diagnosis not present

## 2020-11-16 DIAGNOSIS — M542 Cervicalgia: Secondary | ICD-10-CM | POA: Diagnosis not present

## 2020-11-16 DIAGNOSIS — M5020 Other cervical disc displacement, unspecified cervical region: Secondary | ICD-10-CM | POA: Diagnosis not present

## 2020-11-16 DIAGNOSIS — M5030 Other cervical disc degeneration, unspecified cervical region: Secondary | ICD-10-CM | POA: Diagnosis not present

## 2020-11-16 DIAGNOSIS — Z6832 Body mass index (BMI) 32.0-32.9, adult: Secondary | ICD-10-CM | POA: Diagnosis not present

## 2020-11-19 ENCOUNTER — Encounter: Payer: Self-pay | Admitting: Medical Oncology

## 2020-11-19 NOTE — Progress Notes (Signed)
Spoke with patient as follow up to [facility]. He did not go for second opinion at [facility]. He has decided he would like to move forward with brachytherapy as soon as possible. He just learned he will need spinal surgery for spinal stenosis and would like to do the seed implant first if possible. Ailene Ards, PA and [employer].

## 2020-11-24 ENCOUNTER — Telehealth: Payer: Self-pay | Admitting: *Deleted

## 2020-11-24 NOTE — Telephone Encounter (Signed)
CALLED PATIENT TO ASK QUESTIONS, LVM FOR A RETURN CALL 

## 2020-11-25 ENCOUNTER — Telehealth: Payer: Self-pay | Admitting: *Deleted

## 2020-11-25 ENCOUNTER — Other Ambulatory Visit: Payer: Self-pay | Admitting: Neurosurgery

## 2020-11-25 NOTE — Telephone Encounter (Signed)
Called patient to inform of implant date, spoke with patient and he is aware of his implant date

## 2020-12-04 ENCOUNTER — Encounter: Payer: Self-pay | Admitting: Family

## 2020-12-04 ENCOUNTER — Telehealth (INDEPENDENT_AMBULATORY_CARE_PROVIDER_SITE_OTHER): Payer: Medicare Other | Admitting: Family

## 2020-12-04 VITALS — Temp 98.6°F

## 2020-12-04 DIAGNOSIS — H6690 Otitis media, unspecified, unspecified ear: Secondary | ICD-10-CM | POA: Insufficient documentation

## 2020-12-04 MED ORDER — AMOXICILLIN 500 MG PO CAPS: 500.0000 mg | ORAL_CAPSULE | Freq: Three times a day (TID) | ORAL | 0 refills | Status: AC

## 2020-12-04 NOTE — Progress Notes (Signed)
MyChart Video Visit    Virtual Visit via Video Note   This visit type was conducted due to national recommendations for restrictions regarding the COVID-19 Pandemic (e.g. social distancing) in an effort to limit this patient's exposure and mitigate transmission in our community. This patient is at least at moderate risk for complications without adequate follow up. This format is felt to be most appropriate for this patient at this time. Physical exam was limited by quality of the video and audio technology used for the visit. CMA was able to get the patient set up on a video visit.  Patient location: home Patient and provider in visit Provider location: Office  I discussed the limitations of evaluation and management by telemedicine and the availability of in person appointments. The patient expressed understanding and agreed to proceed.  Visit Date: 12/04/2020  Today's healthcare provider: Nance Pear, NP     Subjective:    [name]., male    DOB: 23-Aug-1950, 70 y.o.   MRN: [id number]  Chief Complaint  Patient presents with  . Sore Throat    Complains of sore throat  . Nasal Congestion    HPI Patient is in today to discuss several new symptoms which began on Monday 5/16.  He reports + sore throat, new daily AM headaches which is unusual. He also reports + nasal congestion and pressure around his eyes.  Feels like cervical LN's are tender.  Reports + R ear tenderness.  He took a home covid test yesterday and it was negative. He is scheduled for surgery on surgery on his C-spine on 12/15/20 and he hopes to get well before that time.   Past Medical History:  Diagnosis Date  . Arthritis    Back  . Cancer ([place])    bladder  . CKD (chronic kidney disease), stage I   . History of bladder cancer    1998--  TCC  . History of kidney stones   . Hyperlipidemia   . Hypertension   . Insulin pump in place   . LADA (latent autoimmune diabetes in adults),  managed as type 1 [facility])    endocrinologist-- dr Buddy Duty  . Nephrolithiasis    bilateral --  nonobstructive per CT  . PONV (postoperative nausea and vomiting)   . Prostate cancer ([place])   . Spinal stenosis, cervical region     Past Surgical History:  Procedure Laterality Date  . COLONOSCOPY  2018   x2  . CYSTOSCOPY W/ URETERAL STENT PLACEMENT Right 05/12/2015   Procedure: CYSTOSCOPY WITH STENT REPLACEMENT;  Surgeon: Cleon Gustin, MD;  Location: [facility];  Service: Urology;  Laterality: Right;  . CYSTOSCOPY WITH RETROGRADE PYELOGRAM, URETEROSCOPY AND STENT PLACEMENT Right 01/23/2019   Procedure: CYSTOSCOPY WITH RIGHT RETROGRADE PYELOGRAM, URETEROSCOPY AND STENT PLACEMENT;  Surgeon: Irine Seal, MD;  Location: [facility];  Service: Urology;  Laterality: Right;  . CYSTOSCOPY WITH URETEROSCOPY AND STENT PLACEMENT Right 04/23/2015   Procedure: CYSTOSCOPY WITH URETEROSCOPY RETROGRADE PYELOGRAM AND STENT PLACEMENT;  Surgeon: Cleon Gustin, MD;  Location: [facility];  Service: Urology;  Laterality: Right;  . CYSTOSCOPY/RETROGRADE/URETEROSCOPY/STONE EXTRACTION WITH BASKET Right 05/12/2015   Procedure: CYSTOSCOPY/RETROGRADE/URETEROSCOPY/STONE EXTRACTION WITH BASKET;  Surgeon: Cleon Gustin, MD;  Location[facility];  Service: Urology;  Laterality: Right;  . CYSTOSCOPY/URETEROSCOPY/HOLMIUM LASER/STENT PLACEMENT Left 09/18/2018   Procedure: CYSTOSCOPY/ LEFT RETROGRADE LEFT URETEROSCOPY Holiday LASER/STENT PLACEMENT;  Surgeon: Irine Seal, MD;  Location: [facility];  Service:  Urology;  Laterality: Left;  . CYSTOSCOPY/URETEROSCOPY/HOLMIUM LASER/STENT PLACEMENT Right 10/24/2019   Procedure: CYSTOSCOPY/URETEROSCOPY/HOLMIUM LASER/STENT PLACEMENT;  Surgeon: Irine Seal, MD;  Location: [facility];  Service: Urology;  Laterality: Right;  . DENTAL SURGERY    . EXTRACORPOREAL SHOCK WAVE LITHOTRIPSY  x2 in  2006//   x2  in 2012  . HOLMIUM LASER APPLICATION Right  [id number]   Procedure: HOLMIUM LASER APPLICATION;  Surgeon: Cleon Gustin, MD;  Location: [facility];  Service: Urology;  Laterality: Right;  . HOLMIUM LASER APPLICATION Right [id number]   Procedure: HOLMIUM LASER APPLICATION;  Surgeon: Irine Seal, MD;  Location[facility];  Service: Urology;  Laterality: Right;  . IR URETERAL STENT RIGHT NEW ACCESS W/O SEP NEPHROSTOMY CATH  11/25/2019  . LAPAROSCOPIC APPENDECTOMY N/A 11/07/2012   Procedure: APPENDECTOMY LAPAROSCOPIC;  Surgeon: Harl Bowie, MD;  Location: [place];  Service: General;  Laterality: N/A;  . NEPHROLITHOTOMY Left 08/06/2015   Procedure: 1ST STAGE  LEFT PERCUTANEOUS NEPHROLITHOTOMY ;  Surgeon: Irine Seal, MD;  Location: [facility];  Service: Urology;  Laterality: Left;  . NEPHROLITHOTOMY Right 11/26/2019   Procedure: NEPHROLITHOTOMY PERCUTANEOUS;  Surgeon: Irine Seal, MD;  Location: [facility];  Service: Urology;  Laterality: Right;  . NEPHROLITHOTOMY Right 11/28/2019   Procedure: NEPHROLITHOTOMY PERCUTANEOUS SECOND LOOK;  Surgeon: Alexis Frock, MD;  Location[facility];  Service: Urology;  Laterality: Right;  . TONSILLECTOMY  1975  . TRANSURETHRAL RESECTION OF BLADDER TUMOR  1998    Family History  Problem Relation Age of Onset  . Cancer Other        Colon and Prostate Cancer  . Hypertension Other   . Diabetes Other   . Alcohol abuse Neg Hx   . Drug abuse Neg Hx   . Early death Neg Hx   . Heart disease Neg Hx   . Hyperlipidemia Neg Hx   . Kidney disease Neg Hx   . Stroke Neg Hx     Social History   Socioeconomic History  . Marital status: Married    Spouse name: Not on file  . Number of children: Not on file  . Years of education: Not on file  . Highest education level: Master's degree (e.g., MA, MS, MEng, MEd, MSW, MBA)  Occupational History  . Not on file  Tobacco Use  . Smoking status: Current Every Day Smoker    Packs/day: 0.50    Years: 30.00    Pack years: 15.00    Types: Cigarettes  .  Smokeless tobacco: Never Used  Vaping Use  . Vaping Use: Never used  Substance and Sexual Activity  . Alcohol use: Not Currently    Alcohol/week: 5.0 standard drinks    Types: 5 Shots of liquor per week    Comment: occasional  . Drug use: No  . Sexual activity: Yes  Other Topics Concern  . Not on file  Social History Narrative  . Not on file   Social Determinants of Health   Financial Resource Strain: Low Risk   . Difficulty of Paying Living Expenses: Not hard at all  Food Insecurity: No Food Insecurity  . Worried About Charity fundraiser in the Last Year: Never true  . Ran Out of Food in the Last Year: Never true  Transportation Needs: No Transportation Needs  . Lack of Transportation (Medical): No  . Lack of Transportation (Non-Medical): No  Physical Activity: Sufficiently Active  . Days of Exercise per Week: 5 days  . Minutes of Exercise  per Session: 30 min  Stress: No Stress Concern Present  . Feeling of Stress : Not at all  Social Connections: Socially Integrated  . Frequency of Communication with Friends and Family: More than three times a week  . Frequency of Social Gatherings with Friends and Family: More than three times a week  . Attends Religious Services: More than 4 times per year  . Active Member of Clubs or Organizations: Yes  . Attends Banker Meetings: More than 4 times per year  . Marital Status: Married  Catering manager Violence: Not on file    Outpatient Medications Prior to Visit  Medication Sig Dispense Refill  . aspirin EC 81 MG tablet Take 81 mg by mouth in the morning.    . Calcium Carb-Cholecalciferol (CALCIUM 500 + D3 PO) Take 1 tablet by mouth in the morning and at bedtime.    . candesartan (ATACAND) 16 MG tablet TAKE 1 TABLET BY MOUTH EVERY DAY (Patient taking differently: Take 16 mg by mouth in the morning.) 90 tablet 1  . Ciclopirox 0.77 % gel APPLY TO AFFECTED AREA TWICE A DAY (Patient taking differently: Apply 1 application  topically 2 (two) times daily as needed (anti fungal (ankles)).) 30 g 2  . Continuous Blood Gluc Receiver (DEXCOM G6 RECEIVER) DEVI See admin instructions.    . Continuous Blood Gluc Sensor (DEXCOM G6 SENSOR) MISC change sensor    . Continuous Blood Gluc Transmit (DEXCOM G6 TRANSMITTER) MISC See admin instructions.    Marland Kitchen ezetimibe (ZETIA) 10 MG tablet TAKE 1 TABLET EVERY DAY (Patient taking differently: Take 10 mg by mouth in the morning.) 90 tablet 1  . Glucagon (GVOKE HYPOPEN 2-PACK) 1 MG/0.2ML SOAJ Inject 1 Act into the skin daily as needed. (Patient taking differently: Inject 1 Act into the skin daily as needed (hypoglycemia).) 2 mL 5  . glucose blood (ONETOUCH VERIO) test strip 5X A DAY    . indapamide (LOZOL) 1.25 MG tablet TAKE 1 TABLET BY MOUTH DAILY. (Patient taking differently: Take 1.25 mg by mouth in the morning.) 90 tablet 0  . Insulin Glargine (LANTUS SOLOSTAR [place]) Inject 15 Units into the skin 2 (two) times daily as needed (when pump is not in use.).    . Insulin Human (INSULIN PUMP) SOLN Inject 1 each into the skin continuous. Novolog insulin    . levocetirizine (XYZAL) 5 MG tablet TAKE 1 TABLET (5 MG TOTAL) BY MOUTH DAILY AS NEEDED FOR ALLERGIES. 90 tablet 1  . metaxalone (SKELAXIN) 400 MG tablet Take 400 mg by mouth in the morning.    Marland Kitchen NOVOLOG FLEXPEN 100 UNIT/ML FlexPen Inject into the skin. Via insulin pump    . Polyethyl Glycol-Propyl Glycol (SYSTANE OP) Place 1 drop into both eyes 3 (three) times daily as needed (dry/irritated eyes).    . Potassium Citrate 15 MEQ (1620 MG) TBCR Take 1 tablet by mouth in the morning and at bedtime.    Marland Kitchen SIMPLY SALINE NA Place 1-2 sprays into the nose 3 (three) times daily as needed (congestion/allergies).    . tamsulosin (FLOMAX) 0.4 MG CAPS capsule Take 0.4 mg by mouth in the morning.     No facility-administered medications prior to visit.    Allergies  Allergen Reactions  . Altace [Ramipril] Cough  . Crestor [Rosuvastatin] Other (See  Comments)    Muscle aches, cramps  . Lipitor [Atorvastatin] Other (See Comments)    Muscle aches, cramps  . Contrast Media [Iodinated Diagnostic Agents] Itching and Nausea And Vomiting  PT TRIED THIS TWICE.EVEN WITH PRE-MEDS AND STILL HAD NAUSEA AND ITCHING--WAS TOLD HE CAN NOT TAKE iv ANYMORE  . Dilaudid [Hydromorphone] Nausea And Vomiting  . Iodine     Betadine/iodine rash per patient  . Latex Other (See Comments)    Irritation  . Oxycodone Nausea Only    Pt can tolerate hydrocodone in small doses only!  . Adhesive [Tape] Rash  . Peanut-Containing Drug Products Anxiety and Other (See Comments)    Jittery    ROS     Objective:    Physical Exam Constitutional:      Appearance: He is well-developed.     Comments: Appears tired  Pulmonary:     Effort: No respiratory distress.  Neurological:     Mental Status: He is alert.  Psychiatric:        Attention and Perception: Attention normal.        Mood and Affect: Mood normal.        Speech: Speech normal.        Behavior: Behavior normal.        Cognition and Memory: Cognition normal.     Temp 98.6 F (37 C)  Wt Readings from Last 3 Encounters:  10/23/20 250 lb 12.8 oz (113.8 kg)  09/11/20 253 lb (114.8 kg)  07/28/20 244 lb 6.4 oz (110.9 kg)    Diabetic Foot Exam - Simple   No data filed    Lab Results  Component Value Date   WBC 10.2 07/28/2020   HGB 14.7 07/28/2020   HCT 43.8 07/28/2020   PLT 179.0 07/28/2020   GLUCOSE 87 07/28/2020   CHOL 108 07/28/2020   TRIG 86.0 07/28/2020   HDL 42.30 07/28/2020   LDLCALC 49 07/28/2020   ALT 18 07/28/2020   AST 16 07/28/2020   NA 138 07/28/2020   K 3.7 07/28/2020   CL 101 07/28/2020   CREATININE 1.09 07/28/2020   BUN 17 07/28/2020   CO2 29 07/28/2020   TSH 1.40 07/28/2020   PSA 7.64 (H) 07/28/2020   INR 1.0 11/25/2019   HGBA1C 7.7 (H) 07/28/2020   MICROALBUR 1.6 07/28/2020    Lab Results  Component Value Date   TSH 1.40 07/28/2020   Lab Results   Component Value Date   WBC 10.2 07/28/2020   HGB 14.7 07/28/2020   HCT 43.8 07/28/2020   MCV 90.0 07/28/2020   PLT 179.0 07/28/2020   Lab Results  Component Value Date   NA 138 07/28/2020   K 3.7 07/28/2020   CO2 29 07/28/2020   GLUCOSE 87 07/28/2020   BUN 17 07/28/2020   CREATININE 1.09 07/28/2020   BILITOT 0.6 07/28/2020   ALKPHOS 97 07/28/2020   AST 16 07/28/2020   ALT 18 07/28/2020   PROT 7.1 07/28/2020   ALBUMIN 4.3 07/28/2020   CALCIUM 9.4 07/28/2020   ANIONGAP 13 12/03/2019   GFR 70.04 07/28/2020   Lab Results  Component Value Date   CHOL 108 07/28/2020   Lab Results  Component Value Date   HDL 42.30 07/28/2020   Lab Results  Component Value Date   LDLCALC 49 07/28/2020   Lab Results  Component Value Date   TRIG 86.0 07/28/2020   Lab Results  Component Value Date   CHOLHDL 3 07/28/2020   Lab Results  Component Value Date   HGBA1C 7.7 (H) 07/28/2020       Assessment & Plan:   Problem List Items Addressed This Visit   None     I am  having Anthony Daniel. "Tommy" maintain his Insulin Glargine (LANTUS SOLOSTAR [place]), aspirin EC, Polyethyl Glycol-Propyl Glycol (SYSTANE OP), insulin pump, Gvoke HypoPen 2-Pack, Ciclopirox, ezetimibe, OneTouch Verio, Dexcom G6 Receiver, Dexcom G6 Sensor, Dexcom G6 Transmitter, levocetirizine, indapamide, NovoLOG FlexPen, candesartan, tamsulosin, Potassium Citrate, metaxalone, SIMPLY SALINE NA, and Calcium Carb-Cholecalciferol (CALCIUM 500 + D3 PO).  No orders of the defined types were placed in this encounter.   I discussed the assessment and treatment plan with the patient. The patient was provided an opportunity to ask questions and all were answered. The patient agreed with the plan and demonstrated an understanding of the instructions.   The patient was advised to call back or seek an in-person evaluation if the symptoms worsen or if the condition fails to improve as anticipated.    Nance Pear,  NP [employer] [facility] [employer] [phone] (phone) [phone] (fax)  [place]

## 2020-12-04 NOTE — Assessment & Plan Note (Signed)
Hx concerning for R OM.  I would also like to cover him for possible strep pharyngitis. Will rx with amoxicillin 500mg  TID x 10 days. Pt is advised to hydrate, use nasal saline spray for nasal congestion, tylenol as needed for headache and to call if symptoms worsen or if not improved in 3-4 days. Pt verbalizes understanding.

## 2020-12-08 NOTE — H&P (Signed)
Patient ID:   [id number] Patient: Anthony Daniel  Date of Birth: June 16, 1951 Visit Type: Office Visit   Date: 11/16/2020 02:45 PM Provider: Marchia Meiers. Vertell Limber MD   This 70 year old male presents for neck pain.  HISTORY OF PRESENT ILLNESS: 1.  neck pain  Vasily Fedewa, 70 year old retired male, visits in transfer of care on Dr. Donnella Bi retirement.  He was seen by Dr. Sherwood Gambler in January of 2020 with right-sided neck pain and right shoulder/arm pain.  ACDF C3-4, C4-5 was discussed and patient chose to continue with symptomatic treatment, planning to return to Dr. Sherwood Gambler later that spring.  He did not return as all symptoms dissipated.  Today, he reports progressive right-sided body weakness and "throbbing" since early December 2021. He denies injury.  He states cervical and right shoulder symptoms are similar to his 2020 visit; but right arm symptoms are worse and right leg symptoms are new.  He continues to smoke 1 pack per day No EtOH or substance abuse history other than tobacco  History:  Hypertension, diabetes, bladder cancer, hypercholesterolemia, nephrolithiasis Surgical history:  Bladder 1998, "several" nephrolithotomies (2017-2021), cystoscopy and ureteroscopy 2016 for kidney stones  Advil OTC taken p.r.n.  Cervical MRI 10/06/2020 on canopy  The patient has a 7 mm spinal canal at C4-5 and an 8 mm spinal canal at the C3-4 level.  He has right hand intrinsic weakness at 4/5 and right finger extensor weakness at 4/5  The patient has been diagnosed with prostate cancer and will likely require seed implantation.       PAST MEDICAL/SURGICAL HISTORY:   (Reviewed, updated)   Disease/disorder Onset Date Management Date Comments   Nephrolithotomy percutaneous     Colonoscopy   Arthritis     Cancer, bladder     Diabetes     High cholesterol     Hypertension     Kidney stone        PAST MEDICAL HISTORY, SURGICAL HISTORY, FAMILY  HISTORY, SOCIAL HISTORY AND REVIEW OF SYSTEMS I have reviewed the patient's past medical, surgical, family and social history as well as the comprehensive review of systems as included on the [facility] history form dated 11/12/2020, which I have signed.  Family History:  (Reviewed, updated) Relationship Family Member Name Deceased Age at Death Condition Onset Age Cause of Death     Family history of Diabetes mellitus  N     Family history of Cancer, prostate  N     Family history of Hypertension  N     Family history of heart failure  N    Social History:  (Reviewed, updated) Tobacco use reviewed. Preferred language is [place].   Tobacco use status: Heavy cigarette smoker (20-39 cigs/day). Smoking status: Heavy tobacco smoker.  SMOKING STATUS Type Smoking Status Usage Per Day Years Used Total Pack Years Cigarette Heavy tobacco smoker 1 Packs 20 20  TOBACCO CESSATION INFORMATION Date Counseled By Order Status Description Code Tobacco Cessation Information 11/16/2020 Rushie Chestnut Tobacco cessation counseling completed   Smoking cessation education      MEDICATIONS: (added, continued or stopped this visit) Started Medication Directions Instruction Stopped  Arnuity Ellipta 50 mcg/actuation powder for inhalation     aspirin 81 mg tablet,delayed release take 1 tablet by oral route  every day    cholecalciferol (vitamin D3) 50 mcg (2,000 unit) capsule     ciclopirox 0.77 % topical gel apply by topical route 2 times every day to the affected and surrounding areas of  skin in the morning and evening    cyanocobalamin (vitamin B-12) 3,000 mcg capsule     diclofenac 1 % topical gel apply (2G)  by topical route 2 times every day to the affected area(s)    ezetimibe 10 mg tablet take 1 tablet by oral route  every day    Lantus Solostar U-100 Insulin 100 unit/mL (3 mL)  subcutaneous pen inject by subcutaneous route as per insulin protocol    levocetirizine 5 mg tablet take 1 tablet by oral route  every day in the evening    Livalo 2 mg tablet take 1 tablet by oral route  every day    meloxicam 15 mg tablet take 1 tablet by oral route  every day    metaxalone 400 mg tablet take 2 tablet by oral route 3 times every day as needed    Novolog Mix 70-30 U-100 Insulin 100 unit/mL subcutaneous solution inject by subcutaneous route as per insulin protocol    polyethylene glycol 3350 17 gram oral powder packet take 1 packet by oral route  every day mixed with 8 oz. water, juice, soda, coffee or tea    potassium citrate ER 15 mEq (1,620 mg) tablet,extended release take 2 tablet by oral route 2 times every day with food    ramipril 10 mg capsule take 1 capsule by oral route  every day    sildenafil (pulmonary hypertension) 20 mg tablet take 1 tablet by oral route 3 times every day    tamsulosin 0.4 mg capsule take 1 capsule by oral route  every day 1/2 hour following the same meal each day      ALLERGIES: Ingredient Reaction Medication Name Comment IODINATED CONTRAST MEDIA Nausea; Vomiting; itching   ATORVASTATIN CALCIUM Muscle pain; Cramp Lipitor  ADHESIVE TAPE Rash   PEANUT EXTRACT Anxiety; jittery   ROSUVASTATIN CALCIUM Muscle pain; Cramp Crestor  OXYCODONE Nausea   LATEX Skin irritation    Reviewed, no changes.    PHYSICAL EXAM:  Vitals Date Temp F BP Pulse Ht In Wt Lb BMI BSA Pain Score 11/16/2020  128/77 91 73 250 32.98  6/10     IMPRESSION:  Severe spinal stenosis with cervical myelopathy on examination.  I have recommended proceeding with anterior cervical decompression and fusion at the C3-4 and C4-5 levels.  Timing will need to be sorted out in relation to his prostate cancer diagnosis and plan treatment for that.  PLAN: Proceed with anterior cervical  decompression and fusion at the C3-4 and C4-5 levels for cervical stenosis and symptomatic cervical myelopathy.  Risks and benefits were discussed in detail with the patient and he wishes to proceed with surgery.  Patient education was performed in detail today.  Orders: Instruction(s)/Education: Assessment Instruction  Tobacco cessation counseling I10 Lifestyle education [phone] Dietary management education, guidance, and counseling  Completed Orders (this encounter) Order Details Reason Side Interpretation Result Initial Treatment Date Region Tobacco cessation counseling        Lifestyle education Patient will follow up with Primary Care Physician.       Dietary management education, guidance, and counseling Encouraged patient to eat well balanced diet.        Assessment/Plan  # Detail Type Description  1. Assessment Herniated nucleus pulposus, cervical (M50.20).     2. Assessment Cervical radiculopathy (M54.12).     3. Assessment Degenerative disc disease, cervical (M50.30).     4. Assessment Cervical spondylosis with radiculopathy (M47.22).     5. Assessment Cervicalgia (M54.2).  6. Assessment Essential (primary) hypertension (I10).     7. Assessment Body mass index (BMI) 32.0-32.9, adult (R92.34).  Plan Orders Today's instructions / counseling include(s) Dietary management education, guidance, and counseling. Clinical information/comments: Encouraged patient to eat well balanced diet.       Pain Management Plan Pain Scale: 6/10. Method: Numeric Pain Intensity Scale. Location: back. Onset: 02/15/2018. Duration: varies. Quality: discomforting. Pain management follow-up plan of care: Patient will continue medication management..              Provider:  Marchia Meiers. Vertell Limber MD  11/20/2020 06:22 PM    Dictation edited by: Marchia Meiers. Vertell Limber    CC Providers: Scarlette [place] [place],  [place]  [id number]-   David Spivey  [facility] [address], [place] [address]-               Electronically signed by Marchia Meiers. Vertell Limber MD on 11/20/2020 06:22 PM

## 2020-12-10 ENCOUNTER — Encounter (HOSPITAL_COMMUNITY): Payer: Self-pay | Admitting: Neurosurgery

## 2020-12-10 ENCOUNTER — Other Ambulatory Visit: Payer: Self-pay

## 2020-12-10 NOTE — Progress Notes (Signed)
Anesthesia Chart Review: Anthony Daniel   Case: [id number] Date/Time: 12/15/20 1415   Procedure: Cervical 3-4 Cervical 4-5 Anterior cervical decompression/discectomy/fusion (N/A )   Anesthesia type: General   Pre-op diagnosis: Cervical stenosis of spine   Location: [facility] OR ROOM 21 / [facility]   Surgeons: Erline Levine, MD      DISCUSSION: Patient is a 70 year old male scheduled for the above procedure.  History includes smoking, post-operative N/V, HTN, HLD, latent autoimmune diabetes managed as type 1 (has insulin pump), CKD, prostate cancer (active surveillance 09/2019, scheduled for radioactive seed implant 02/09/21), bladder cancer (s/p TURBT ~ 1996), nephrolithiasis/urolithiasis (uric acid and calcium oxalate stones, s/p multiple procedures, last 01/30/20). Prescribed 10 day course of amoxicillin 12/04/20 for right acute OM (and to cover for strep pharyngitis).  A1c 8.2% 11/03/20 at [facility] Will enter order for DM Coordinator consult due to insulin pump.  Last ASA 12/08/20.  Presurgical COVID-19 test is scheduled for 12/11/2020.  Anesthesia team to evaluate on the day of surgery.    VS: Ht 6\' 1"  (1.854 m)   Wt 113.4 kg   BMI 32.98 kg/m   BP Readings from Last 3 Encounters:  10/23/20 139/71  09/11/20 136/76  07/28/20 136/76   Pulse Readings from Last 3 Encounters:  10/23/20 79  09/11/20 79  07/28/20 77    PROVIDERS: Janith Lima, MD is PCP Delrae Rend, MD is endocrinologist Irine Seal, MD is urologist. He has also seen Roxanne Mins, MD at [facility] for prostate cancer 09/20/19 and [facility] Avie Arenas, MD for ED and Stefan Church, MD for urolithiasis. Tyler Pita, MD is RAD-ONC   LABS: For day of surgery. As of 07/28/20 Cr 1.09 and CBC normal. A1c 8.2% 11/03/20 at [facility].    IMAGES: MRI C-spine 10/05/20: IMPRESSION: 1. Progressed disc and endplate degeneration at C4-C5 since a 2019 MRI with increased spinal stenosis and spinal cord mass  effect there. Associated C5 degenerative endplate marrow edema. Moderate to severe C5 neural foraminal stenosis also appears increased on the right. 2. Progressed C3-C4 degeneration along with multifactorial mild spinal stenosis and mild cord mass effect, moderate to severe C4 foraminal stenosis greater on the left. 3. No cervical spinal cord signal abnormality. Degeneration and stenosis elsewhere is stable since 2019.  MRI Brain 10/05/20: IMPRESSION: No evidence of recent infarction hemorrhage, or mass. Mild chronic microvascular ischemic changes.   EKG: EKG 01/27/20 ([facility]):Copy of 01/27/20 14:16:16 EKG tracing on shadow chart. Per Result Narrative:  Sinus rhythm with occasional premature atrial complexes  Nonspecific T wave abnormalities, Inferolateral leads  Abnormal ECG  No previous ECGs available  I reviewed and concur with this report. Electronically signed YD:XAJOIN, MD, CHRISTOPHER [phone]) on 01/27/2020 2:39:29 PM   CV: ETT 10/03/12 (done for preoperative evaluation for abnormal EKG, lateral T wave abnormality): ETT Interpretation:  normal - no evidence of ischemia by ST analysis Comments: Fair exercise tolerance. No chest pain. Normal BP response to exercise. No ST-T changes to suggest ischemia.    Past Medical History:  Diagnosis Date  . Arthritis    Back  . Cancer ([place])    bladder  . CKD (chronic kidney disease), stage I   . History of bladder cancer    1998--  TCC  . History of kidney stones   . Hyperlipidemia   . Hypertension   . Insulin pump in place   . LADA (latent autoimmune diabetes in adults), managed as type 1 [facility])    endocrinologist-- dr Buddy Duty  .  Nephrolithiasis    bilateral --  nonobstructive per CT  . PONV (postoperative nausea and vomiting)   . Prostate cancer ([place])   . Spinal stenosis, cervical region     Past Surgical History:  Procedure Laterality Date  . APPENDECTOMY    . COLONOSCOPY  2018   x2  . CYSTOSCOPY W/ URETERAL STENT  PLACEMENT Right 05/12/2015   Procedure: CYSTOSCOPY WITH STENT REPLACEMENT;  Surgeon: Cleon Gustin, MD;  Location: [facility];  Service: Urology;  Laterality: Right;  . CYSTOSCOPY WITH RETROGRADE PYELOGRAM, URETEROSCOPY AND STENT PLACEMENT Right 01/23/2019   Procedure: CYSTOSCOPY WITH RIGHT RETROGRADE PYELOGRAM, URETEROSCOPY AND STENT PLACEMENT;  Surgeon: Irine Seal, MD;  Location: [facility];  Service: Urology;  Laterality: Right;  . CYSTOSCOPY WITH URETEROSCOPY AND STENT PLACEMENT Right 04/23/2015   Procedure: CYSTOSCOPY WITH URETEROSCOPY RETROGRADE PYELOGRAM AND STENT PLACEMENT;  Surgeon: Cleon Gustin, MD;  Location: [facility];  Service: Urology;  Laterality: Right;  . CYSTOSCOPY/RETROGRADE/URETEROSCOPY/STONE EXTRACTION WITH BASKET Right 05/12/2015   Procedure: CYSTOSCOPY/RETROGRADE/URETEROSCOPY/STONE EXTRACTION WITH BASKET;  Surgeon: Cleon Gustin, MD;  Location: [facility];  Service: Urology;  Laterality: Right;  . CYSTOSCOPY/URETEROSCOPY/HOLMIUM LASER/STENT PLACEMENT Left 09/18/2018   Procedure: CYSTOSCOPY/ LEFT RETROGRADE LEFT URETEROSCOPY [place] LASER/STENT PLACEMENT;  Surgeon: Irine Seal, MD;  Location: [facility];  Service: Urology;  Laterality: Left;  . CYSTOSCOPY/URETEROSCOPY/HOLMIUM LASER/STENT PLACEMENT Right 10/24/2019   Procedure: CYSTOSCOPY/URETEROSCOPY/HOLMIUM LASER/STENT PLACEMENT;  Surgeon: Irine Seal, MD;  Location: [facility];  Service: Urology;  Laterality: Right;  . DENTAL SURGERY    . EXTRACORPOREAL SHOCK WAVE LITHOTRIPSY  x2 in  2006//   x2  in 2012  . HOLMIUM LASER APPLICATION Right 50/03/3817   Procedure: HOLMIUM LASER APPLICATION;  Surgeon: Cleon Gustin, MD;  Location: [facility];  Service: Urology;  Laterality: Right;  . HOLMIUM LASER APPLICATION Right 08/26/9369   Procedure: HOLMIUM LASER APPLICATION;  Surgeon: Irine Seal, MD;  Location[facility];  Service: Urology;  Laterality: Right;  . IR  URETERAL STENT RIGHT NEW ACCESS W/O SEP NEPHROSTOMY CATH  11/25/2019  . LAPAROSCOPIC APPENDECTOMY N/A 11/07/2012   Procedure: APPENDECTOMY LAPAROSCOPIC;  Surgeon: Harl Bowie, MD;  Location: [place];  Service: General;  Laterality: N/A;  . NEPHROLITHOTOMY Left 08/06/2015   Procedure: 1ST STAGE  LEFT PERCUTANEOUS NEPHROLITHOTOMY ;  Surgeon: Irine Seal, MD;  Location: [facility];  Service: Urology;  Laterality: Left;  . NEPHROLITHOTOMY Right 11/26/2019   Procedure: NEPHROLITHOTOMY PERCUTANEOUS;  Surgeon: Irine Seal, MD;  Location: [facility];  Service: Urology;  Laterality: Right;  . NEPHROLITHOTOMY Right 11/28/2019   Procedure: NEPHROLITHOTOMY PERCUTANEOUS SECOND LOOK;  Surgeon: Alexis Frock, MD;  Location: [facility];  Service: Urology;  Laterality: Right;  . TONSILLECTOMY  1975  . TRANSURETHRAL RESECTION OF BLADDER TUMOR  1998    MEDICATIONS: No current facility-administered medications for this encounter.   Marland Kitchen aspirin EC 81 MG tablet  . Calcium Carb-Cholecalciferol (CALCIUM 500 + D3 PO)  . candesartan (ATACAND) 16 MG tablet  . Ciclopirox 0.77 % gel  . ezetimibe (ZETIA) 10 MG tablet  . Glucagon (GVOKE HYPOPEN 2-PACK) 1 MG/0.2ML SOAJ  . indapamide (LOZOL) 1.25 MG tablet  . Insulin Glargine (LANTUS SOLOSTAR [place])  . Insulin Human (INSULIN PUMP) SOLN  . levocetirizine (XYZAL) 5 MG tablet  . metaxalone (SKELAXIN) 400 MG tablet  . NOVOLOG FLEXPEN 100 UNIT/ML FlexPen  . Polyethyl Glycol-Propyl Glycol (SYSTANE OP)  . Potassium Citrate 15 MEQ (1620 MG) TBCR  . SIMPLY SALINE  NA  . tamsulosin (FLOMAX) 0.4 MG CAPS capsule  . amoxicillin (AMOXIL) 500 MG capsule  . Continuous Blood Gluc Receiver ([place]) [place]  . Continuous Blood Gluc Sensor (DEXCOM G6 SENSOR) MISC  . Continuous Blood Gluc Transmit (DEXCOM G6 TRANSMITTER) MISC  . glucose blood (ONETOUCH VERIO) test strip    Myra Gianotti, PA-C Surgical Short Stay/Anesthesiology [facility] Phone [phone] [facility] Phone [phone] 12/10/2020 5:05 PM

## 2020-12-10 NOTE — Progress Notes (Signed)
PCP - Dr. Ronnald Ramp Cardiologist -   Chest x-ray -  EKG - DOS Stress Test -  ECHO -  Cardiac Cath -    Fasting Blood Sugar:  Insulin pump and CBG monitor Checks Blood Sugar:  x/day  Blood Thinner Instructions:  Aspirin Instructions: ASA Last dose per pt 12/08/20  COVID TEST- 12/11/20  Anesthesia review: yes med hx   -------------  SDW INSTRUCTIONS:  Your procedure is scheduled on Tuesday 5/31. Please report to [facility] Main Entrance "A" at 1200 P.M., and check in at the Admitting office. Call this number if you have problems the morning of surgery: [phone]   Remember: Do not eat or drink after midnight the night before your surgery   Medications to take morning of surgery with a sip of water include: amoxicillin (AMOXIL) ezetimibe (ZETIA) metaxalone (SKELAXIN) levocetirizine (XYZAL)   ** PLEASE check your blood sugar the morning of your surgery when you wake up and every 2 hours until you get to the Short Stay unit.  If your blood sugar is less than 70 mg/dL, you will need to treat for low blood sugar: - Do not take insulin. - Treat a low blood sugar (less than 70 mg/dL) with  cup of clear juice (cranberry or apple), 4 glucose tablets, OR glucose gel. - Recheck blood sugar in 15 minutes after treatment (to make sure it is greater than 70 mg/dL). If your blood sugar is not greater than 70 mg/dL on recheck, call [phone] for further instructions.  Insulin Glargine (LANTUS SOLOSTAR [place])  5/30: 12 units  5/31: 12 units   Insulin Human (INSULIN PUMP) Reduce basal 20% night before surgery at 0000  As of today, STOP taking any Aspirin (unless otherwise instructed by your surgeon), Aleve, Naproxen, Ibuprofen, Motrin, Advil, Goody's, BC's, all herbal medications, fish oil, and all vitamins.    The Morning of Surgery Do not wear jewelry Do not wear lotions, powders, colognes, or deodorant Men may shave face and neck. Do not bring valuables to the hospital. [facility] is not responsible for any belongings or valuables. If you are a smoker, DO NOT Smoke 24 hours prior to surgery If you wear a CPAP at night please bring your mask the morning of surgery  Remember that you must have someone to transport you home after your surgery, and remain with you for 24 hours if you are discharged the same day. Please bring cases for contacts, glasses, hearing aids, dentures or bridgework because it cannot be worn into surgery.   Patients discharged the day of surgery will not be allowed to drive home.   Please shower the NIGHT BEFORE SURGERY and the MORNING OF SURGERY with DIAL Soap. Wear comfortable clothes the morning of surgery. Oral Hygiene is also important to reduce your risk of infection.  Remember - BRUSH YOUR TEETH THE MORNING OF SURGERY WITH YOUR REGULAR TOOTHPASTE  Patient denies shortness of breath, fever, cough and chest pain.

## 2020-12-11 ENCOUNTER — Other Ambulatory Visit (HOSPITAL_COMMUNITY)
Admission: RE | Admit: 2020-12-11 | Discharge: 2020-12-11 | Disposition: A | Discharge status: Home / Self Care | Payer: Medicare Other | Source: Ambulatory Visit | Attending: Neurosurgery | Admitting: Neurosurgery

## 2020-12-11 DIAGNOSIS — Z01812 Encounter for preprocedural laboratory examination: Secondary | ICD-10-CM | POA: Diagnosis not present

## 2020-12-11 DIAGNOSIS — Z20822 Contact with and (suspected) exposure to covid-19: Secondary | ICD-10-CM | POA: Insufficient documentation

## 2020-12-12 LAB — SARS CORONAVIRUS 2 (TAT 6-24 HRS): SARS Coronavirus 2: NEGATIVE

## 2020-12-15 ENCOUNTER — Inpatient Hospital Stay (HOSPITAL_COMMUNITY): Payer: Medicare Other

## 2020-12-15 ENCOUNTER — Inpatient Hospital Stay (HOSPITAL_COMMUNITY): Payer: Medicare Other | Admitting: Vascular Surgery

## 2020-12-15 ENCOUNTER — Other Ambulatory Visit: Payer: Self-pay

## 2020-12-15 ENCOUNTER — Encounter (HOSPITAL_COMMUNITY)
Admission: RE | Disposition: A | Discharge status: Home / Self Care | Payer: Self-pay | Source: Ambulatory Visit | Attending: Neurosurgery

## 2020-12-15 ENCOUNTER — Observation Stay (HOSPITAL_COMMUNITY)
Admission: RE | Admit: 2020-12-15 | Discharge: 2020-12-16 | Disposition: A | Discharge status: Home / Self Care | Payer: Medicare Other | Source: Ambulatory Visit | Attending: Neurosurgery | Admitting: Neurosurgery

## 2020-12-15 ENCOUNTER — Encounter (HOSPITAL_COMMUNITY): Payer: Self-pay | Admitting: Neurosurgery

## 2020-12-15 DIAGNOSIS — Z91010 Allergy to peanuts: Secondary | ICD-10-CM | POA: Diagnosis not present

## 2020-12-15 DIAGNOSIS — Z79899 Other long term (current) drug therapy: Secondary | ICD-10-CM | POA: Insufficient documentation

## 2020-12-15 DIAGNOSIS — M4802 Spinal stenosis, cervical region: Secondary | ICD-10-CM | POA: Diagnosis not present

## 2020-12-15 DIAGNOSIS — M50121 Cervical disc disorder at C4-C5 level with radiculopathy: Secondary | ICD-10-CM | POA: Insufficient documentation

## 2020-12-15 DIAGNOSIS — M4322 Fusion of spine, cervical region: Secondary | ICD-10-CM | POA: Diagnosis not present

## 2020-12-15 DIAGNOSIS — Z8551 Personal history of malignant neoplasm of bladder: Secondary | ICD-10-CM | POA: Diagnosis not present

## 2020-12-15 DIAGNOSIS — Z7982 Long term (current) use of aspirin: Secondary | ICD-10-CM | POA: Diagnosis not present

## 2020-12-15 DIAGNOSIS — M50021 Cervical disc disorder at C4-C5 level with myelopathy: Secondary | ICD-10-CM | POA: Insufficient documentation

## 2020-12-15 DIAGNOSIS — Z981 Arthrodesis status: Secondary | ICD-10-CM | POA: Diagnosis not present

## 2020-12-15 DIAGNOSIS — I10 Essential (primary) hypertension: Secondary | ICD-10-CM | POA: Insufficient documentation

## 2020-12-15 DIAGNOSIS — M5010 Cervical disc disorder with radiculopathy, unspecified cervical region: Secondary | ICD-10-CM | POA: Diagnosis not present

## 2020-12-15 DIAGNOSIS — Z91040 Latex allergy status: Secondary | ICD-10-CM | POA: Diagnosis not present

## 2020-12-15 DIAGNOSIS — E119 Type 2 diabetes mellitus without complications: Secondary | ICD-10-CM | POA: Diagnosis not present

## 2020-12-15 DIAGNOSIS — I129 Hypertensive chronic kidney disease with stage 1 through stage 4 chronic kidney disease, or unspecified chronic kidney disease: Secondary | ICD-10-CM | POA: Diagnosis not present

## 2020-12-15 DIAGNOSIS — Z794 Long term (current) use of insulin: Secondary | ICD-10-CM | POA: Diagnosis not present

## 2020-12-15 DIAGNOSIS — F17210 Nicotine dependence, cigarettes, uncomplicated: Secondary | ICD-10-CM | POA: Diagnosis not present

## 2020-12-15 DIAGNOSIS — Z419 Encounter for procedure for purposes other than remedying health state, unspecified: Secondary | ICD-10-CM

## 2020-12-15 DIAGNOSIS — N181 Chronic kidney disease, stage 1: Secondary | ICD-10-CM | POA: Diagnosis not present

## 2020-12-15 HISTORY — PX: ANTERIOR CERVICAL DECOMP/DISCECTOMY FUSION: SHX1161

## 2020-12-15 LAB — GLUCOSE, CAPILLARY
Glucose-Capillary: 326 mg/dL — ABNORMAL HIGH (ref 70–99)
Glucose-Capillary: 198 mg/dL — ABNORMAL HIGH (ref 70–99)
Glucose-Capillary: 106 mg/dL — ABNORMAL HIGH (ref 70–99)
Glucose-Capillary: 109 mg/dL — ABNORMAL HIGH (ref 70–99)
Glucose-Capillary: 126 mg/dL — ABNORMAL HIGH (ref 70–99)
Glucose-Capillary: 109 mg/dL — ABNORMAL HIGH (ref 70–99)
Glucose-Capillary: 239 mg/dL — ABNORMAL HIGH (ref 70–99)

## 2020-12-15 LAB — CBC
HCT: 44.4 % (ref 39.0–52.0)
Hemoglobin: 14.6 g/dL (ref 13.0–17.0)
MCH: 30.4 pg (ref 26.0–34.0)
MCHC: 32.9 g/dL (ref 30.0–36.0)
MCV: 92.5 fL (ref 80.0–100.0)
Platelets: 184 10*3/uL (ref 150–400)
RBC: 4.8 MIL/uL (ref 4.22–5.81)
RDW: 14.6 % (ref 11.5–15.5)
WBC: 11.1 10*3/uL — ABNORMAL HIGH (ref 4.0–10.5)
nRBC: 0 % (ref 0.0–0.2)

## 2020-12-15 LAB — BASIC METABOLIC PANEL
Anion gap: 9 (ref 5–15)
BUN: 14 mg/dL (ref 8–23)
CO2: 25 mmol/L (ref 22–32)
Calcium: 8.7 mg/dL — ABNORMAL LOW (ref 8.9–10.3)
Chloride: 100 mmol/L (ref 98–111)
Creatinine, Ser: 1.08 mg/dL (ref 0.61–1.24)
GFR, Estimated: >60 mL/min (ref >60)
Glucose, Bld: 304 mg/dL — ABNORMAL HIGH (ref 70–99)
Potassium: 4.1 mmol/L (ref 3.5–5.1)
Sodium: 134 mmol/L — ABNORMAL LOW (ref 135–145)

## 2020-12-15 LAB — TYPE AND SCREEN
ABO/RH(D): A POS
Antibody Screen: NEGATIVE

## 2020-12-15 LAB — SURGICAL PCR SCREEN
MRSA, PCR: NEGATIVE
Staphylococcus aureus: NEGATIVE

## 2020-12-15 SURGERY — ANTERIOR CERVICAL DECOMPRESSION/DISCECTOMY FUSION 2 LEVELS
Anesthesia: General

## 2020-12-15 MED ORDER — INSULIN ASPART 100 UNIT/ML IJ SOLN
INTRAMUSCULAR | Status: AC
  Filled 2020-12-15: qty 1

## 2020-12-15 MED ORDER — PROPOFOL 10 MG/ML IV BOLUS
INTRAVENOUS | Status: AC
  Filled 2020-12-15: qty 20

## 2020-12-15 MED ORDER — INSULIN ASPART 100 UNIT/ML IJ SOLN
5.0000 [IU] | Freq: Once | INTRAMUSCULAR | Status: AC
  Administered 2020-12-15: 5 [IU] via SUBCUTANEOUS

## 2020-12-15 MED ORDER — ACETAMINOPHEN 10 MG/ML IV SOLN
INTRAVENOUS | Status: DC | PRN
  Administered 2020-12-15: 1000 mg via INTRAVENOUS

## 2020-12-15 MED ORDER — FENTANYL CITRATE (PF) 250 MCG/5ML IJ SOLN
INTRAMUSCULAR | Status: DC | PRN
  Administered 2020-12-15 (×4): 50 ug via INTRAVENOUS

## 2020-12-15 MED ORDER — INSULIN ASPART 100 UNIT/ML IJ SOLN
0.0000 [IU] | Freq: Three times a day (TID) | INTRAMUSCULAR | Status: DC
  Administered 2020-12-16: 5 [IU] via SUBCUTANEOUS

## 2020-12-15 MED ORDER — ORAL CARE MOUTH RINSE: 15.0000 mL | Freq: Once | OROMUCOSAL | Status: AC

## 2020-12-15 MED ORDER — METHOCARBAMOL 500 MG PO TABS
ORAL_TABLET | ORAL | Status: AC
  Filled 2020-12-15: qty 1

## 2020-12-15 MED ORDER — MUPIROCIN 2 % EX OINT
TOPICAL_OINTMENT | CUTANEOUS | Status: AC
  Administered 2020-12-15: 1 via TOPICAL
  Filled 2020-12-15: qty 22

## 2020-12-15 MED ORDER — SODIUM CHLORIDE 0.9% FLUSH: 3.0000 mL | INTRAVENOUS | Status: DC | PRN

## 2020-12-15 MED ORDER — MORPHINE SULFATE (PF) 2 MG/ML IV SOLN
INTRAVENOUS | Status: AC
  Filled 2020-12-15: qty 1

## 2020-12-15 MED ORDER — INSULIN ASPART 100 UNIT/ML IJ SOLN: 10.0000 [IU] | Freq: Once | INTRAMUSCULAR | Status: AC

## 2020-12-15 MED ORDER — BISACODYL 10 MG RE SUPP: 10.0000 mg | Freq: Every day | RECTAL | Status: DC | PRN

## 2020-12-15 MED ORDER — DEXTROSE 50 % IV SOLN
INTRAVENOUS | Status: DC | PRN
  Administered 2020-12-15: 25 mL via INTRAVENOUS

## 2020-12-15 MED ORDER — ROCURONIUM BROMIDE 10 MG/ML (PF) SYRINGE
PREFILLED_SYRINGE | INTRAVENOUS | Status: DC | PRN
  Administered 2020-12-15: 30 mg via INTRAVENOUS
  Administered 2020-12-15: 70 mg via INTRAVENOUS

## 2020-12-15 MED ORDER — MENTHOL 3 MG MT LOZG: 1.0000 | LOZENGE | OROMUCOSAL | Status: DC | PRN

## 2020-12-15 MED ORDER — LACTATED RINGERS IV SOLN: INTRAVENOUS | Status: DC

## 2020-12-15 MED ORDER — BUPIVACAINE HCL (PF) 0.5 % IJ SOLN
INTRAMUSCULAR | Status: AC
  Filled 2020-12-15: qty 30

## 2020-12-15 MED ORDER — DEXAMETHASONE SODIUM PHOSPHATE 10 MG/ML IJ SOLN
INTRAMUSCULAR | Status: DC | PRN
  Administered 2020-12-15: 10 mg via INTRAVENOUS

## 2020-12-15 MED ORDER — GLUCAGON HCL RDNA (DIAGNOSTIC) 1 MG IJ SOLR: 1.0000 mg | Freq: Every day | INTRAMUSCULAR | Status: DC | PRN

## 2020-12-15 MED ORDER — DOCUSATE SODIUM 100 MG PO CAPS
100.0000 mg | ORAL_CAPSULE | Freq: Two times a day (BID) | ORAL | Status: DC
  Administered 2020-12-15 – 2020-12-16 (×2): 100 mg via ORAL
  Filled 2020-12-15 (×2): qty 1

## 2020-12-15 MED ORDER — SODIUM CHLORIDE 0.9% FLUSH
3.0000 mL | Freq: Two times a day (BID) | INTRAVENOUS | Status: DC
  Administered 2020-12-15: 3 mL via INTRAVENOUS

## 2020-12-15 MED ORDER — ASPIRIN EC 81 MG PO TBEC
81.0000 mg | DELAYED_RELEASE_TABLET | Freq: Every morning | ORAL | Status: DC
  Administered 2020-12-16: 81 mg via ORAL

## 2020-12-15 MED ORDER — POLYVINYL ALCOHOL 1.4 % OP SOLN
Freq: Three times a day (TID) | OPHTHALMIC | Status: DC | PRN
  Filled 2020-12-15: qty 15

## 2020-12-15 MED ORDER — EZETIMIBE 10 MG PO TABS
10.0000 mg | ORAL_TABLET | Freq: Every morning | ORAL | Status: DC
  Administered 2020-12-16: 10 mg via ORAL
  Filled 2020-12-15: qty 1

## 2020-12-15 MED ORDER — ACETAMINOPHEN 325 MG PO TABS: 650.0000 mg | ORAL_TABLET | ORAL | Status: DC | PRN

## 2020-12-15 MED ORDER — CHLORHEXIDINE GLUCONATE 0.12 % MT SOLN: 15.0000 mL | Freq: Once | OROMUCOSAL | Status: AC

## 2020-12-15 MED ORDER — PHENOL 1.4 % MT LIQD: 1.0000 | OROMUCOSAL | Status: DC | PRN

## 2020-12-15 MED ORDER — CHLORHEXIDINE GLUCONATE 0.12 % MT SOLN
OROMUCOSAL | Status: AC
  Administered 2020-12-15: 15 mL via OROMUCOSAL
  Filled 2020-12-15: qty 15

## 2020-12-15 MED ORDER — INSULIN PUMP
1.0000 | SUBCUTANEOUS | Status: DC
  Filled 2020-12-15: qty 1

## 2020-12-15 MED ORDER — CHLORHEXIDINE GLUCONATE CLOTH 2 % EX PADS: 6.0000 | MEDICATED_PAD | Freq: Once | CUTANEOUS | Status: DC

## 2020-12-15 MED ORDER — FENTANYL CITRATE (PF) 250 MCG/5ML IJ SOLN
INTRAMUSCULAR | Status: AC
  Filled 2020-12-15: qty 5

## 2020-12-15 MED ORDER — BUPIVACAINE HCL (PF) 0.5 % IJ SOLN
INTRAMUSCULAR | Status: DC | PRN
  Administered 2020-12-15: 5 mL

## 2020-12-15 MED ORDER — METHOCARBAMOL 1000 MG/10ML IJ SOLN
500.0000 mg | Freq: Four times a day (QID) | INTRAVENOUS | Status: DC | PRN
  Filled 2020-12-15: qty 5

## 2020-12-15 MED ORDER — DEXAMETHASONE SODIUM PHOSPHATE 10 MG/ML IJ SOLN
INTRAMUSCULAR | Status: AC
  Filled 2020-12-15: qty 1

## 2020-12-15 MED ORDER — INSULIN ASPART 100 UNIT/ML IJ SOLN
INTRAMUSCULAR | Status: AC
  Administered 2020-12-15: 10 [IU] via SUBCUTANEOUS
  Filled 2020-12-15: qty 1

## 2020-12-15 MED ORDER — ONDANSETRON HCL 4 MG/2ML IJ SOLN
INTRAMUSCULAR | Status: AC
  Filled 2020-12-15: qty 2

## 2020-12-15 MED ORDER — SUGAMMADEX SODIUM 200 MG/2ML IV SOLN
INTRAVENOUS | Status: DC | PRN
  Administered 2020-12-15: 225 mg via INTRAVENOUS

## 2020-12-15 MED ORDER — METAXALONE 800 MG PO TABS
400.0000 mg | ORAL_TABLET | Freq: Every morning | ORAL | Status: DC
  Filled 2020-12-15: qty 1

## 2020-12-15 MED ORDER — KETOROLAC TROMETHAMINE 15 MG/ML IJ SOLN
7.5000 mg | Freq: Four times a day (QID) | INTRAMUSCULAR | Status: DC
  Administered 2020-12-15 – 2020-12-16 (×3): 7.5 mg via INTRAVENOUS
  Filled 2020-12-15 (×2): qty 1

## 2020-12-15 MED ORDER — 0.9 % SODIUM CHLORIDE (POUR BTL) OPTIME
TOPICAL | Status: DC | PRN
  Administered 2020-12-15: 1000 mL

## 2020-12-15 MED ORDER — METHOCARBAMOL 500 MG PO TABS
500.0000 mg | ORAL_TABLET | Freq: Four times a day (QID) | ORAL | Status: DC | PRN
  Administered 2020-12-15 – 2020-12-16 (×3): 500 mg via ORAL
  Filled 2020-12-15 (×2): qty 1

## 2020-12-15 MED ORDER — PROPOFOL 10 MG/ML IV BOLUS
INTRAVENOUS | Status: DC | PRN
  Administered 2020-12-15: 150 mg via INTRAVENOUS

## 2020-12-15 MED ORDER — THROMBIN 5000 UNITS EX SOLR
OROMUCOSAL | Status: DC | PRN
  Administered 2020-12-15: 5 mL via TOPICAL

## 2020-12-15 MED ORDER — INDAPAMIDE 1.25 MG PO TABS
1.2500 mg | ORAL_TABLET | Freq: Every morning | ORAL | Status: DC
  Administered 2020-12-16: 1.25 mg via ORAL
  Filled 2020-12-15: qty 1

## 2020-12-15 MED ORDER — INSULIN PUMP
SUBCUTANEOUS | Status: DC
  Filled 2020-12-15: qty 1

## 2020-12-15 MED ORDER — THROMBIN 5000 UNITS EX SOLR
CUTANEOUS | Status: AC
  Filled 2020-12-15: qty 5000

## 2020-12-15 MED ORDER — INSULIN ASPART 100 UNIT/ML IJ SOLN
4.0000 [IU] | Freq: Three times a day (TID) | INTRAMUSCULAR | Status: DC
  Administered 2020-12-16: 4 [IU] via SUBCUTANEOUS

## 2020-12-15 MED ORDER — HYDROCODONE-ACETAMINOPHEN 5-325 MG PO TABS
1.0000 | ORAL_TABLET | ORAL | Status: DC | PRN
  Administered 2020-12-15 – 2020-12-16 (×3): 1 via ORAL
  Filled 2020-12-15 (×3): qty 1

## 2020-12-15 MED ORDER — ACETAMINOPHEN 10 MG/ML IV SOLN: 1000.0000 mg | Freq: Once | INTRAVENOUS | Status: DC | PRN

## 2020-12-15 MED ORDER — FLEET ENEMA 7-19 GM/118ML RE ENEM: 1.0000 | ENEMA | Freq: Once | RECTAL | Status: DC | PRN

## 2020-12-15 MED ORDER — SALINE SPRAY 0.65 % NA SOLN: 1.0000 | Freq: Three times a day (TID) | NASAL | Status: DC | PRN

## 2020-12-15 MED ORDER — ONDANSETRON HCL 4 MG/2ML IJ SOLN: 4.0000 mg | Freq: Once | INTRAMUSCULAR | Status: DC | PRN

## 2020-12-15 MED ORDER — MORPHINE SULFATE (PF) 2 MG/ML IV SOLN
INTRAVENOUS | Status: AC
  Administered 2020-12-15: 1 mg via INTRAVENOUS
  Filled 2020-12-15: qty 1

## 2020-12-15 MED ORDER — GLYCOPYRROLATE PF 0.2 MG/ML IJ SOSY
PREFILLED_SYRINGE | INTRAMUSCULAR | Status: AC
  Filled 2020-12-15: qty 1

## 2020-12-15 MED ORDER — ONDANSETRON HCL 4 MG/2ML IJ SOLN
INTRAMUSCULAR | Status: DC | PRN
  Administered 2020-12-15: 4 mg via INTRAVENOUS

## 2020-12-15 MED ORDER — INSULIN ASPART 100 UNIT/ML IJ SOLN
0.0000 [IU] | Freq: Every day | INTRAMUSCULAR | Status: DC
  Administered 2020-12-15: 5 [IU] via SUBCUTANEOUS

## 2020-12-15 MED ORDER — CICLOPIROX 0.77 % EX GEL: 1.0000 "application " | Freq: Two times a day (BID) | CUTANEOUS | Status: DC | PRN

## 2020-12-15 MED ORDER — CEFAZOLIN SODIUM-DEXTROSE 2-4 GM/100ML-% IV SOLN
2.0000 g | INTRAVENOUS | Status: AC
  Administered 2020-12-15: 2 g via INTRAVENOUS
  Filled 2020-12-15: qty 100

## 2020-12-15 MED ORDER — ACETAMINOPHEN 650 MG RE SUPP: 650.0000 mg | RECTAL | Status: DC | PRN

## 2020-12-15 MED ORDER — GLYCOPYRROLATE PF 0.2 MG/ML IJ SOSY
PREFILLED_SYRINGE | INTRAMUSCULAR | Status: DC | PRN
  Administered 2020-12-15: .2 mg via INTRAVENOUS

## 2020-12-15 MED ORDER — ALUM & MAG HYDROXIDE-SIMETH 200-200-20 MG/5ML PO SUSP: 30.0000 mL | Freq: Four times a day (QID) | ORAL | Status: DC | PRN

## 2020-12-15 MED ORDER — LIDOCAINE 2% (20 MG/ML) 5 ML SYRINGE
INTRAMUSCULAR | Status: DC | PRN
  Administered 2020-12-15: 100 mg via INTRAVENOUS

## 2020-12-15 MED ORDER — DEXTROSE 50 % IV SOLN
INTRAVENOUS | Status: AC
  Administered 2020-12-15: 12.5 g via INTRAVENOUS
  Filled 2020-12-15: qty 50

## 2020-12-15 MED ORDER — MORPHINE SULFATE (PF) 2 MG/ML IV SOLN: 1.0000 mg | INTRAVENOUS | Status: DC | PRN

## 2020-12-15 MED ORDER — TAMSULOSIN HCL 0.4 MG PO CAPS
0.4000 mg | ORAL_CAPSULE | Freq: Every morning | ORAL | Status: DC
  Administered 2020-12-16: 0.4 mg via ORAL
  Filled 2020-12-15: qty 1

## 2020-12-15 MED ORDER — ONDANSETRON HCL 4 MG/2ML IJ SOLN: 4.0000 mg | Freq: Four times a day (QID) | INTRAMUSCULAR | Status: DC | PRN

## 2020-12-15 MED ORDER — IRBESARTAN 150 MG PO TABS
150.0000 mg | ORAL_TABLET | Freq: Every day | ORAL | Status: DC
  Administered 2020-12-16: 150 mg via ORAL
  Filled 2020-12-15: qty 1

## 2020-12-15 MED ORDER — POLYETHYLENE GLYCOL 3350 17 G PO PACK: 17.0000 g | PACK | Freq: Every day | ORAL | Status: DC | PRN

## 2020-12-15 MED ORDER — MORPHINE SULFATE (PF) 2 MG/ML IV SOLN
1.0000 mg | INTRAVENOUS | Status: DC | PRN
  Administered 2020-12-15: 1 mg via INTRAVENOUS

## 2020-12-15 MED ORDER — PHENYLEPHRINE HCL-NACL 10-0.9 MG/250ML-% IV SOLN
INTRAVENOUS | Status: DC | PRN
  Administered 2020-12-15: 25 ug/min via INTRAVENOUS

## 2020-12-15 MED ORDER — KCL IN DEXTROSE-NACL 20-5-0.45 MEQ/L-%-% IV SOLN: INTRAVENOUS | Status: DC

## 2020-12-15 MED ORDER — MUPIROCIN 2 % EX OINT: 1.0000 "application " | TOPICAL_OINTMENT | Freq: Once | CUTANEOUS | Status: AC

## 2020-12-15 MED ORDER — ONDANSETRON HCL 4 MG PO TABS: 4.0000 mg | ORAL_TABLET | Freq: Four times a day (QID) | ORAL | Status: DC | PRN

## 2020-12-15 MED ORDER — CEFAZOLIN SODIUM-DEXTROSE 2-4 GM/100ML-% IV SOLN
2.0000 g | Freq: Three times a day (TID) | INTRAVENOUS | Status: AC
  Administered 2020-12-15 – 2020-12-16 (×2): 2 g via INTRAVENOUS
  Filled 2020-12-15: qty 100

## 2020-12-15 MED ORDER — LIDOCAINE-EPINEPHRINE 1 %-1:100000 IJ SOLN
INTRAMUSCULAR | Status: DC | PRN
  Administered 2020-12-15: 5 mL

## 2020-12-15 MED ORDER — SODIUM CHLORIDE 0.9 % IV SOLN: 250.0000 mL | INTRAVENOUS | Status: DC

## 2020-12-15 MED ORDER — PANTOPRAZOLE SODIUM 40 MG IV SOLR
40.0000 mg | Freq: Every day | INTRAVENOUS | Status: DC
  Administered 2020-12-15: 40 mg via INTRAVENOUS
  Filled 2020-12-15: qty 40

## 2020-12-15 MED ORDER — ZOLPIDEM TARTRATE 5 MG PO TABS: 5.0000 mg | ORAL_TABLET | Freq: Every evening | ORAL | Status: DC | PRN

## 2020-12-15 MED ORDER — KETOROLAC TROMETHAMINE 15 MG/ML IJ SOLN
INTRAMUSCULAR | Status: AC
  Filled 2020-12-15: qty 1

## 2020-12-15 MED ORDER — DEXTROSE 50 % IV SOLN: 12.5000 g | Freq: Once | INTRAVENOUS | Status: AC

## 2020-12-15 MED ORDER — LORATADINE 10 MG PO TABS: 10.0000 mg | ORAL_TABLET | Freq: Every day | ORAL | Status: DC | PRN

## 2020-12-15 MED ORDER — ALBUMIN HUMAN 5 % IV SOLN: INTRAVENOUS | Status: DC | PRN

## 2020-12-15 MED ORDER — ACETAMINOPHEN 10 MG/ML IV SOLN
INTRAVENOUS | Status: AC
  Filled 2020-12-15: qty 100

## 2020-12-15 MED ORDER — INSULIN GLARGINE 100 UNIT/ML SOLOSTAR PEN: 15.0000 [IU] | PEN_INJECTOR | Freq: Two times a day (BID) | SUBCUTANEOUS | Status: DC | PRN

## 2020-12-15 SURGICAL SUPPLY — 63 items
BAND RUBBER #18 3X1/16 STRL (MISCELLANEOUS) ×8 IMPLANT
BASKET BONE COLLECTION (BASKET) ×2 IMPLANT
BIT DRILL NEURO 2X3.1 SFT TUCH (MISCELLANEOUS) ×1 IMPLANT
BIT DRILL OZARK SU 2.3X14 (DRILL) ×1 IMPLANT
BLADE ULTRA TIP 2M (BLADE) IMPLANT
BNDG GAUZE ELAST 4 BULKY (GAUZE/BANDAGES/DRESSINGS) IMPLANT
BUR BARREL STRAIGHT FLUTE 4.0 (BURR) ×2 IMPLANT
CANISTER SUCT 3000ML PPV (MISCELLANEOUS) ×2 IMPLANT
CARTRIDGE OIL MAESTRO DRILL (MISCELLANEOUS) ×1 IMPLANT
COVER MAYO STAND STRL (DRAPES) ×2 IMPLANT
COVER WAND RF STERILE (DRAPES) IMPLANT
DECANTER SPIKE VIAL GLASS SM (MISCELLANEOUS) ×2 IMPLANT
DERMABOND ADVANCED (GAUZE/BANDAGES/DRESSINGS) ×1
DERMABOND ADVANCED .7 DNX12 (GAUZE/BANDAGES/DRESSINGS) ×1 IMPLANT
DIFFUSER DRILL AIR PNEUMATIC (MISCELLANEOUS) ×2 IMPLANT
DRAPE HALF SHEET 40X57 (DRAPES) ×2 IMPLANT
DRAPE LAPAROTOMY 100X72 PEDS (DRAPES) ×2 IMPLANT
DRAPE MICROSCOPE LEICA (MISCELLANEOUS) ×2 IMPLANT
DRILL NEURO 2X3.1 SOFT TOUCH (MISCELLANEOUS) ×2
DRILL OZARK SU 2.3X14 (DRILL) ×2
DRSG OPSITE POSTOP 4X6 (GAUZE/BANDAGES/DRESSINGS) ×2 IMPLANT
DURAPREP 6ML APPLICATOR 50/CS (WOUND CARE) ×2 IMPLANT
ELECT COATED BLADE 2.86 ST (ELECTRODE) ×2 IMPLANT
ELECT REM PT RETURN 9FT ADLT (ELECTROSURGICAL) ×2
ELECTRODE REM PT RTRN 9FT ADLT (ELECTROSURGICAL) ×1 IMPLANT
GAUZE 4X4 16PLY RFD (DISPOSABLE) IMPLANT
GAUZE SPONGE 4X4 12PLY STRL (GAUZE/BANDAGES/DRESSINGS) IMPLANT
GLOVE BIO SURGEON STRL SZ8 (GLOVE) ×2 IMPLANT
GLOVE BIOGEL PI IND STRL 8.5 (GLOVE) ×1 IMPLANT
GLOVE BIOGEL PI INDICATOR 8.5 (GLOVE) ×1
GLOVE ECLIPSE 8.0 STRL XLNG CF (GLOVE) ×2 IMPLANT
GLOVE EXAM NITRILE XL STR (GLOVE) IMPLANT
GLOVE SRG 8 PF TXTR STRL LF DI (GLOVE) ×1 IMPLANT
GLOVE SURG POLYISO LF SZ8 (GLOVE) ×2 IMPLANT
GLOVE SURG UNDER POLY LF SZ8 (GLOVE) ×2
GLOVE SURG UNDER POLY LF SZ8.5 (GLOVE) ×2 IMPLANT
GOWN STRL REUS W/ TWL LRG LVL3 (GOWN DISPOSABLE) IMPLANT
GOWN STRL REUS W/ TWL XL LVL3 (GOWN DISPOSABLE) ×1 IMPLANT
GOWN STRL REUS W/TWL 2XL LVL3 (GOWN DISPOSABLE) ×2 IMPLANT
GOWN STRL REUS W/TWL LRG LVL3 (GOWN DISPOSABLE)
GOWN STRL REUS W/TWL XL LVL3 (GOWN DISPOSABLE) ×2
HALTER HD/CHIN CERV TRACTION D (MISCELLANEOUS) ×2 IMPLANT
HEMOSTAT POWDER KIT SURGIFOAM (HEMOSTASIS) ×2 IMPLANT
KIT BASIN OR (CUSTOM PROCEDURE TRAY) ×2 IMPLANT
KIT TURNOVER KIT B (KITS) ×2 IMPLANT
NEEDLE HYPO 25X1 1.5 SAFETY (NEEDLE) ×2 IMPLANT
NEEDLE SPNL 22GX3.5 QUINCKE BK (NEEDLE) ×2 IMPLANT
NS IRRIG 1000ML POUR BTL (IV SOLUTION) ×2 IMPLANT
OIL CARTRIDGE MAESTRO DRILL (MISCELLANEOUS) ×2
PACK LAMINECTOMY NEURO (CUSTOM PROCEDURE TRAY) ×2 IMPLANT
PAD ARMBOARD 7.5X6 YLW CONV (MISCELLANEOUS) ×2 IMPLANT
PEEK SPACER AVS AS 6X14X16 4D (Cage) ×4 IMPLANT
PIN DISTRACTION 14MM (PIN) ×4 IMPLANT
PLATE CERV OZARK 2LVL 40 (Plate) ×2 IMPLANT
SCREW VA ST OZARK 4X14 (Screw) ×12 IMPLANT
SCREW VA ST OZARK 4X14 FX (Screw) ×2 IMPLANT
SPONGE INTESTINAL PEANUT (DISPOSABLE) ×2 IMPLANT
SPONGE SURGIFOAM ABS GEL SZ50 (HEMOSTASIS) IMPLANT
STAPLER SKIN PROX WIDE 3.9 (STAPLE) IMPLANT
SUT VIC AB 3-0 SH 8-18 (SUTURE) ×2 IMPLANT
TOWEL GREEN STERILE (TOWEL DISPOSABLE) ×2 IMPLANT
TOWEL GREEN STERILE FF (TOWEL DISPOSABLE) ×2 IMPLANT
WATER STERILE IRR 1000ML POUR (IV SOLUTION) ×2 IMPLANT

## 2020-12-15 NOTE — Anesthesia Procedure Notes (Signed)
Procedure Name: Intubation Date/Time: 12/15/2020 3:50 PM Performed by: Renato Shin, CRNA Pre-anesthesia Checklist: Patient identified, Emergency Drugs available, Suction available and Patient being monitored Patient Re-evaluated:Patient Re-evaluated prior to induction Oxygen Delivery Method: Circle system utilized Preoxygenation: Pre-oxygenation with 100% oxygen Induction Type: IV induction Ventilation: Mask ventilation without difficulty and Oral airway inserted - appropriate to patient size Laryngoscope Size: Glidescope and 4 Grade View: Grade I Tube type: Oral Tube size: 7.5 mm Number of attempts: 1 Airway Equipment and Method: Oral airway,  Rigid stylet and Video-laryngoscopy Placement Confirmation: ETT inserted through vocal cords under direct vision,  positive ETCO2 and breath sounds checked- equal and bilateral Secured at: 22 cm Tube secured with: Tape Dental Injury: Teeth and Oropharynx as per pre-operative assessment  Difficulty Due To: Difficult Airway- due to large tongue, Difficult Airway- due to reduced neck mobility and Difficulty was anticipated Future Recommendations: Recommend- induction with short-acting agent, and alternative techniques readily available

## 2020-12-15 NOTE — Transfer of Care (Signed)
Immediate Anesthesia Transfer of Care Note  Patient: Anthony Daniel.  Procedure(s) Performed: Cervical Three-Four Cervical Four-Five Anterior cervical decompression/discectomy/fusion (N/A )  Patient Location: PACU  Anesthesia Type:General  Level of Consciousness: awake and alert   Airway & Oxygen Therapy: Patient Spontanous Breathing and Patient connected to face mask oxygen  Post-op Assessment: Report given to RN, Post -op Vital signs reviewed and stable and Patient moving all extremities X 4  Post vital signs: Reviewed and stable  Last Vitals:  Vitals Value Taken Time  BP 137/72 12/15/20 1755  Temp    Pulse 67 12/15/20 1758  Resp 18 12/15/20 1758  SpO2 99 % 12/15/20 1758  Vitals shown include unvalidated device data.  Last Pain:  Vitals:   12/15/20 1305  TempSrc:   PainSc: 0-No pain      Patients Stated Pain Goal: 3 (45/80/99 8338)  Complications: No complications documented.

## 2020-12-15 NOTE — Progress Notes (Signed)
Orthopedic Tech Progress Note Patient Details:  Anthony Daniel January 23, 1951 [id number] PACU RN called requesting a SOFT COLLAR Ortho Devices Type of Ortho Device: Soft collar Ortho Device/Splint Location: NECK Ortho Device/Splint Interventions: Ordered,Application   Post Interventions Patient Tolerated: Well Instructions Provided: Care of [place] 12/15/2020, 6:37 PM

## 2020-12-15 NOTE — Anesthesia Preprocedure Evaluation (Signed)
Anesthesia Evaluation  Patient identified by MRN, date of birth, ID band Patient awake    Reviewed: Allergy & Precautions, NPO status , Patient's Chart, lab work & pertinent test results  History of Anesthesia Complications (+) PONV  Airway Mallampati: II  TM Distance: >3 FB Neck ROM: Limited    Dental no notable dental hx.    Pulmonary neg pulmonary ROS, Current Smoker and Patient abstained from smoking.,    Pulmonary exam normal breath sounds clear to auscultation       Cardiovascular hypertension, Normal cardiovascular exam Rhythm:Regular Rate:Normal     Neuro/Psych negative neurological ROS  negative psych ROS   GI/Hepatic negative GI ROS, Neg liver ROS,   Endo/Other  diabetes, Insulin Dependent  Renal/GU negative Renal ROS  negative genitourinary   Musculoskeletal negative musculoskeletal ROS (+)   Abdominal   Peds negative pediatric ROS (+)  Hematology negative hematology ROS (+)   Anesthesia Other Findings   Reproductive/Obstetrics negative OB ROS                             Anesthesia Physical Anesthesia Plan  ASA: III  Anesthesia Plan: General   Post-op Pain Management:    Induction: Intravenous  PONV Risk Score and Plan: 2 and Ondansetron, Dexamethasone and Treatment may vary due to age or medical condition  Airway Management Planned: Oral ETT  Additional Equipment:   Intra-op Plan:   Post-operative Plan: Extubation in OR  Informed Consent: I have reviewed the patients History and Physical, chart, labs and discussed the procedure including the risks, benefits and alternatives for the proposed anesthesia with the patient or authorized representative who has indicated his/her understanding and acceptance.     Dental advisory given  Plan Discussed with: CRNA and Surgeon  Anesthesia Plan Comments:         Anesthesia Quick Evaluation

## 2020-12-15 NOTE — Progress Notes (Signed)
Awake, alert, conversant.  Full strength bilateral D/B/HI.  MAEW.  Doing well.

## 2020-12-15 NOTE — Brief Op Note (Signed)
12/15/2020  5:52 PM  PATIENT:  Anthony Daniel.  70 y.o. male  PRE-OPERATIVE DIAGNOSIS:  Cervical stenosis of spine, herniated cervical disc, cervical myelopathy C 34, C 45 levels  POST-OPERATIVE DIAGNOSIS:  Cervical stenosis of spine, herniated cervical disc, cervical myelopathy C 34, C 45 levels  PROCEDURE:  Procedure(s): Cervical Three-Four Cervical Four-Five Anterior cervical decompression/discectomy/fusion (N/A) with PEEK cages, autograft, plate  SURGEON:  Surgeon(s) and Role:    Erline Levine, MD - Primary    * Dawley, Theodoro Doing, DO - Assisting  PHYSICIAN ASSISTANT:   ASSISTANTS: [place], RN   ANESTHESIA:   general  EBL:  50 mL   BLOOD ADMINISTERED:none  DRAINS: none   LOCAL MEDICATIONS USED:  MARCAINE    and LIDOCAINE   SPECIMEN:  No Specimen  DISPOSITION OF SPECIMEN:  N/A  COUNTS:  YES  TOURNIQUET:  * No tourniquets in log *   DICTATION: Patient is 70 year old male with cervical stenosis and myelopathy with HNP, spondylosis, radiculopathy C 34 and C 45 levels  PROCEDURE: Patient was brought to operating room and following the smooth and uncomplicated induction of general endotracheal anesthesia his head was placed on a horseshoe head holder he was placed in 5 pounds of Holter traction and his anterior neck was prepped and draped in usual sterile fashion. An incision was made on the left side of midline after infiltrating the skin and subcutaneous tissues with local lidocaine. The platysmal layer was incised and subplatysmal dissection was performed exposing the anterior border sternocleidomastoid muscle. Using blunt dissection the carotid sheath was kept lateral and trachea and esophagus kept medial exposing the anterior cervical spine. A bent spinal needle was placed it was felt to be the C 45 level and this was confirmed on intraoperative x-ray. Longus coli muscles were taken down from the anterior cervical spine using electrocautery and key elevator and  self-retaining retractor was placed exposing the C 34 and C 45 levels. The interspaces were incised and a thorough discectomy was performed. Distraction pins were placed. Initially the C 34 level was operated. Uncinate spurs and central spondylitic ridges were drilled down with a high-speed drill. The spinal cord dura and both C 4 nerve roots were widely decompressed. Hemostasis was assured. After trial sizing a 6 mm peek interbody cage was selected and packed with local autograft. This was tamped into position and countersunk appropriately. Attention was the paid to the C 45 level, where similar decompression was performed.  Uncinate spurs and central spondylitic ridges were drilled down with a high-speed drill. The spinal cord dura and both C 5 nerve roots were widely decompressed. Hemostasis was assured. After trial sizing a 6 mm peek interbody cage was selected and packed with local autograft. This was tamped into position and countersunk appropriately.Distraction weight was removed. A 40 mm [place] anterior cervical plate was affixed to the cervical spine with 14 mm variable-angle screws 2 at C3, 2 at C 4 and 2 fixed angle screws at C 5. All screws were well-positioned and locking mechanisms were engaged. A final X ray was obtained which showed well positioned grafts and anterior plate without complicating features. Soft tissues were inspected and found to be in good repair. The wound was irrigated. The platysma layer was closed with 3-0 Vicryl stitches and the skin was reapproximated with 3-0 Vicryl subcuticular stitches. The wound was dressed with Dermabond and an occlusive dressing. Counts were correct at the end of the case. Patient was extubated and taken to  recovery in stable and satisfactory condition.     PLAN OF CARE: Admit to inpatient   PATIENT DISPOSITION:  PACU - hemodynamically stable.   Delay start of Pharmacological VTE agent (>24hrs) due to surgical blood loss or risk of bleeding: yes

## 2020-12-15 NOTE — Interval H&P Note (Signed)
History and Physical Interval Note:  12/15/2020 3:02 PM  Anthony Daniel.  has presented today for surgery, with the diagnosis of Cervical stenosis of spine.  The various methods of treatment have been discussed with the patient and family. After consideration of risks, benefits and other options for treatment, the patient has consented to  Procedure(s): Cervical 3-4 Cervical 4-5 Anterior cervical decompression/discectomy/fusion (N/A) as a surgical intervention.  The patient's history has been reviewed, patient examined, no change in status, stable for surgery.  I have reviewed the patient's chart and labs.  Questions were answered to the patient's satisfaction.     Peggyann Shoals

## 2020-12-15 NOTE — Progress Notes (Addendum)
Patient checked his own blood sugar, cbg was 237 and wanted to give himself an additional 10 units of novolog.  RN did receive in report that patient just got 5 units of novolog before coming to the floor.  RN spoke with patient and patient self administered 5 units of novolog at this time and we will recheck blood sugar before bedtime.

## 2020-12-15 NOTE — Progress Notes (Signed)
Pt checked sugar again, 222; pt will administer himself 5 units of novolog.  Pt refused to have [place] check sugar, he will use his own personal device.

## 2020-12-15 NOTE — Op Note (Signed)
12/15/2020  5:52 PM  PATIENT:  Anthony Daniel.  70 y.o. male  PRE-OPERATIVE DIAGNOSIS:  Cervical stenosis of spine, herniated cervical disc, cervical myelopathy C 34, C 45 levels  POST-OPERATIVE DIAGNOSIS:  Cervical stenosis of spine, herniated cervical disc, cervical myelopathy C 34, C 45 levels  PROCEDURE:  Procedure(s): Cervical Three-Four Cervical Four-Five Anterior cervical decompression/discectomy/fusion (N/A) with PEEK cages, autograft, plate  SURGEON:  Surgeon(s) and Role:    Erline Levine, MD - Primary    * Dawley, Theodoro Doing, DO - Assisting  PHYSICIAN ASSISTANT:   ASSISTANTS: Poteat, RN   ANESTHESIA:   general  EBL:  50 mL   BLOOD ADMINISTERED:none  DRAINS: none   LOCAL MEDICATIONS USED:  MARCAINE    and LIDOCAINE   SPECIMEN:  No Specimen  DISPOSITION OF SPECIMEN:  N/A  COUNTS:  YES  TOURNIQUET:  * No tourniquets in log *   DICTATION: Patient is 70 year old male with cervical stenosis and myelopathy with HNP, spondylosis, radiculopathy C 34 and C 45 levels  PROCEDURE: Patient was brought to operating room and following the smooth and uncomplicated induction of general endotracheal anesthesia his head was placed on a horseshoe head holder he was placed in 5 pounds of Holter traction and his anterior neck was prepped and draped in usual sterile fashion. An incision was made on the left side of midline after infiltrating the skin and subcutaneous tissues with local lidocaine. The platysmal layer was incised and subplatysmal dissection was performed exposing the anterior border sternocleidomastoid muscle. Using blunt dissection the carotid sheath was kept lateral and trachea and esophagus kept medial exposing the anterior cervical spine. A bent spinal needle was placed it was felt to be the C 45 level and this was confirmed on intraoperative x-ray. Longus coli muscles were taken down from the anterior cervical spine using electrocautery and key elevator and  self-retaining retractor was placed exposing the C 34 and C 45 levels. The interspaces were incised and a thorough discectomy was performed. Distraction pins were placed. Initially the C 34 level was operated. Uncinate spurs and central spondylitic ridges were drilled down with a high-speed drill. The spinal cord dura and both C 4 nerve roots were widely decompressed. Hemostasis was assured. After trial sizing a 6 mm peek interbody cage was selected and packed with local autograft. This was tamped into position and countersunk appropriately. Attention was the paid to the C 45 level, where similar decompression was performed.  Uncinate spurs and central spondylitic ridges were drilled down with a high-speed drill. The spinal cord dura and both C 5 nerve roots were widely decompressed. Hemostasis was assured. After trial sizing a 6 mm peek interbody cage was selected and packed with local autograft. This was tamped into position and countersunk appropriately.Distraction weight was removed. A 40 mm [place] anterior cervical plate was affixed to the cervical spine with 14 mm variable-angle screws 2 at C3, 2 at C 4 and 2 fixed angle screws at C 5. All screws were well-positioned and locking mechanisms were engaged. A final X ray was obtained which showed well positioned grafts and anterior plate without complicating features. Soft tissues were inspected and found to be in good repair. The wound was irrigated. The platysma layer was closed with 3-0 Vicryl stitches and the skin was reapproximated with 3-0 Vicryl subcuticular stitches. The wound was dressed with Dermabond and an occlusive dressing. Counts were correct at the end of the case. Patient was extubated and taken to  recovery in stable and satisfactory condition.     PLAN OF CARE: Admit to inpatient   PATIENT DISPOSITION:  PACU - hemodynamically stable.   Delay start of Pharmacological VTE agent (>24hrs) due to surgical blood loss or risk of bleeding: yes

## 2020-12-16 ENCOUNTER — Other Ambulatory Visit: Payer: Self-pay | Admitting: Internal Medicine

## 2020-12-16 ENCOUNTER — Encounter (HOSPITAL_COMMUNITY): Payer: Self-pay | Admitting: Neurosurgery

## 2020-12-16 ENCOUNTER — Other Ambulatory Visit: Payer: Self-pay | Admitting: Urology

## 2020-12-16 ENCOUNTER — Other Ambulatory Visit: Payer: Self-pay | Admitting: Family

## 2020-12-16 DIAGNOSIS — E119 Type 2 diabetes mellitus without complications: Secondary | ICD-10-CM | POA: Diagnosis not present

## 2020-12-16 DIAGNOSIS — M50021 Cervical disc disorder at C4-C5 level with myelopathy: Secondary | ICD-10-CM | POA: Diagnosis not present

## 2020-12-16 DIAGNOSIS — C61 Malignant neoplasm of prostate: Secondary | ICD-10-CM

## 2020-12-16 DIAGNOSIS — I10 Essential (primary) hypertension: Secondary | ICD-10-CM | POA: Diagnosis not present

## 2020-12-16 DIAGNOSIS — Z8551 Personal history of malignant neoplasm of bladder: Secondary | ICD-10-CM | POA: Diagnosis not present

## 2020-12-16 DIAGNOSIS — M50121 Cervical disc disorder at C4-C5 level with radiculopathy: Secondary | ICD-10-CM | POA: Diagnosis not present

## 2020-12-16 DIAGNOSIS — M4802 Spinal stenosis, cervical region: Secondary | ICD-10-CM | POA: Diagnosis not present

## 2020-12-16 LAB — GLUCOSE, CAPILLARY: Glucose-Capillary: 218 mg/dL — ABNORMAL HIGH (ref 70–99)

## 2020-12-16 MED ORDER — METHOCARBAMOL 500 MG PO TABS: 500.0000 mg | ORAL_TABLET | Freq: Four times a day (QID) | ORAL | 0 refills | Status: DC | PRN

## 2020-12-16 MED ORDER — HYDROCODONE-ACETAMINOPHEN 5-325 MG PO TABS: 1.0000 | ORAL_TABLET | ORAL | 0 refills | Status: DC | PRN

## 2020-12-16 MED ORDER — PANTOPRAZOLE SODIUM 40 MG PO TBEC: 40.0000 mg | DELAYED_RELEASE_TABLET | Freq: Every day | ORAL | Status: DC

## 2020-12-16 NOTE — Care Management Obs Status (Signed)
[facility] NOTIFICATION   Patient Details  Name: Anthony Daniel. MRN: [id number] Date of Birth: 12-Jun-1951   Medicare Observation Status Notification Given:  Yes    Angelita Ingles, RN 12/16/2020, 9:33 AM

## 2020-12-16 NOTE — Progress Notes (Signed)
Subjective: Patient reports that he is pleased with his postoperative status. He notes some mild upper back/shoulder pain that is well controlled with PO analgesics. No acute events overnight.   Objective: Vital signs in last 24 hours: Temp:  [97.2 F (36.2 C)-99.5 F (37.5 C)] 98.1 F (36.7 C) (06/01 0742) Pulse Rate:  [65-91] 70 (06/01 0742) Resp:  [14-20] 18 (06/01 0742) BP: (127-179)/(66-87) 152/78 (06/01 0742) SpO2:  [93 %-100 %] 99 % (06/01 0742) Weight:  [113.4 kg] 113.4 kg (05/31 1213)  Intake/Output from previous day: 05/31 0701 - 06/01 0700 In: 2265 [P.O.:365; I.V.:1200; IV Piggyback:550] Out: 50 [Blood:50] Intake/Output this shift: No intake/output data recorded.  Physical Exam: Patient is awake, A/O X 4, conversant, and in good spirits. Speech is fluent and appropriate. Doing well. MAEW with good strength that is symmetric bilaterally. 5/5 BUE/BLE. Dressing is clean dry intact. Incision is well approximated with no drainage, erythema, or edema. Soft cervical collar in use.   Lab Results: Recent Labs    12/15/20 1226  WBC 11.1*  HGB 14.6  HCT 44.4  PLT 184   BMET Recent Labs    12/15/20 1226  NA 134*  K 4.1  CL 100  CO2 25  GLUCOSE 304*  BUN 14  CREATININE 1.08  CALCIUM 8.7*    Studies/Results: DG Cervical Spine 2 or 3 views  Result Date: 12/15/2020 CLINICAL DATA:  C3-4, C4-5 ACDF EXAM: CERVICAL SPINE - 2-3 VIEW COMPARISON:  08/10/2018 FINDINGS: 1st cross-table lateral view demonstrates anterior localizing instrument directed at the C4-5 level. Second lateral intraoperative image demonstrates 2 level anterior fusion from C3-C5. C5 vertebral body not well visualized due to overlying shoulders. IMPRESSION: Intraoperative imaging as above. Electronically Signed   By: Rolm Baptise M.D.   On: 12/15/2020 17:44    Assessment/Plan: Patient is post-op day 1 s/p C3/4 and C4/5 ACDF. He is recovering well and reports a significant reduction in his preoperative  symptoms.  His only complaint is mild incisional, shoulder and upper back discomfort.  He has ambulated with nursing staff and is awaiting PT/OT evaluation.  Continue soft cervical collar. Continue working on pain control, mobility and ambulating patient. Will plan for discharge today.     LOS: 1 day     Marvis Moeller, DNP, NP-C 12/16/2020, 7:52 AM

## 2020-12-16 NOTE — Anesthesia Postprocedure Evaluation (Signed)
Anesthesia Post Note  Patient: Anthony Daniel.  Procedure(s) Performed: Cervical Three-Four Cervical Four-Five Anterior cervical decompression/discectomy/fusion (N/A )     Patient location during evaluation: PACU Anesthesia Type: General Level of consciousness: awake and alert Pain management: pain level controlled Vital Signs Assessment: post-procedure vital signs reviewed and stable Respiratory status: spontaneous breathing, nonlabored ventilation, respiratory function stable and patient connected to nasal cannula oxygen Cardiovascular status: blood pressure returned to baseline and stable Postop Assessment: no apparent nausea or vomiting Anesthetic complications: no   No complications documented.  Last Vitals:  Vitals:   12/16/20 0323 12/16/20 0742  BP: (!) 149/87 (!) 152/78  Pulse: 72 70  Resp: 20 18  Temp: 36.9 C 36.7 C  SpO2: 97% 99%    Last Pain:  Vitals:   12/16/20 0742  TempSrc: Oral  PainSc:                  Emeri Estill S

## 2020-12-16 NOTE — Progress Notes (Signed)
Patient is discharged from room 3C10 at this time. Alert and in stable condition. IV site d/c'd and instructions read to patient and spouse with understanding verbalized and all questions answered. Left unit via wheelchair with all belongings at side. 

## 2020-12-16 NOTE — Progress Notes (Addendum)
RN asked pt to check cbg meter and patient's blood sugar is 218; pt self administered 5 units of novolog from home supply; pt refused to have [place] stick him for documentation.

## 2020-12-16 NOTE — Evaluation (Signed)
Occupational Therapy Evaluation Patient Details Name: Anthony Daniel. MRN: [id number] DOB: 1951/06/21 Today's Date: 12/16/2020    History of Present Illness 70 year old male with cervical stenosis and myelopathy with HNP, spondylosis, radiculopathy C 34 and C 45 levels.  Underwent Cervical Three-Four Cervical Four-Five Anterior cervical decompression/discectomy/fusion.   Clinical Impression   Patient admitted for the diagnosis and procedure above.  He is essentially at his baseline for ADL completion and in room mobility/toileting.  Minimal cueing for precautions.  All questions answered and no further acute care needs.  Recommend follow up as prescribed by the MD.      Follow Up Recommendations  No OT follow up    Equipment Recommendations  None recommended by OT    Recommendations for Other Services       Precautions / Restrictions Precautions Precautions: Cervical Precaution Booklet Issued: Yes (comment) Required Braces or Orthoses: Cervical Brace Cervical Brace: Soft collar;For comfort Restrictions Weight Bearing Restrictions: No      Mobility Bed Mobility Overal bed mobility: Modified Independent               Patient Response: Cooperative  Transfers Overall transfer level: Independent                    Balance Overall balance assessment: No apparent balance deficits (not formally assessed)                                         ADL either performed or assessed with clinical judgement   ADL Overall ADL's : At baseline                                       General ADL Comments: patient can figure four sit with ease, no deficits noted.  Minimal cueing for cervical precautions.     Vision Baseline Vision/History: No visual deficits Patient Visual Report: No change from baseline       Perception     Praxis      Pertinent Vitals/Pain Pain Assessment: 0-10 Pain Score: 6  Pain Location: back of neck and  traps Pain Descriptors / Indicators: Aching;Grimacing;Tender Pain Intervention(s): Monitored during session     Hand Dominance Right   Extremity/Trunk Assessment Upper Extremity Assessment Upper Extremity Assessment: Overall WFL for tasks assessed   Lower Extremity Assessment Lower Extremity Assessment: Defer to PT evaluation   Cervical / Trunk Assessment Cervical / Trunk Assessment: Other exceptions Cervical / Trunk Exceptions: c spine surgery   Communication Communication Communication: No difficulties   Cognition Arousal/Alertness: Awake/alert Behavior During Therapy: WFL for tasks assessed/performed Overall Cognitive Status: Within Functional Limits for tasks assessed                                                      Home Living Family/patient expects to be discharged to:: Private residence Living Arrangements: Spouse/significant other Available Help at Discharge: Family;Available 24 hours/day Type of Home: House Home Access: Level entry     Home Layout: Multi-level;Able to live on main level with bedroom/bathroom Alternate Level Stairs-Number of Steps: full flight   Bathroom Shower/Tub: Walk-in shower  Bathroom Accessibility: Yes How Accessible: Accessible via walker Home Equipment: Shower seat   Additional Comments: Mother passed in March of this year, he can borrow her old DME if needed.      Prior Functioning/Environment Level of Independence: Independent                 OT Problem List: Pain      OT Treatment/Interventions:      OT Goals(Current goals can be found in the care plan section) Acute Rehab OT Goals Patient Stated Goal: Ready to return home OT Goal Formulation: With patient Time For Goal Achievement: 12/16/20 Potential to Achieve Goals: Good  OT Frequency:     Barriers to D/C:  none noted          Co-evaluation              AM-PAC OT "6 Clicks" Daily Activity     Outcome Measure Help  from another person eating meals?: None Help from another person taking care of personal grooming?: None Help from another person toileting, which includes using toliet, bedpan, or urinal?: None Help from another person bathing (including washing, rinsing, drying)?: None Help from another person to put on and taking off regular upper body clothing?: None Help from another person to put on and taking off regular lower body clothing?: None 6 Click Score: 24   End of Session Equipment Utilized During Treatment: Cervical collar Nurse Communication: Mobility status  Activity Tolerance: Patient tolerated treatment well Patient left: in bed;with call bell/phone within reach  OT Visit Diagnosis: Pain Pain - part of body:  (C spine)                Time: 0272-5366 OT Time Calculation (min): 18 min Charges:  OT General Charges $OT Visit: 1 Visit OT Evaluation $OT Eval Moderate Complexity: 1 Mod  12/16/2020  Rich, OTR/L  Acute Rehabilitation Services  Office:  [phone]   Metta Clines 12/16/2020, 9:51 AM

## 2020-12-16 NOTE — Care Management CC44 (Signed)
Condition Code 44 Documentation Completed  Patient Details  Name: Anthony Daniel. MRN: [id number] Date of Birth: May 27, 1951   Condition Code 44 given:  Yes Patient signature on Condition Code 44 notice:  Yes Documentation of 2 MD's agreement:  Yes Code 44 added to claim:  Yes    Angelita Ingles, RN 12/16/2020, 9:33 AM

## 2020-12-16 NOTE — Discharge Instructions (Signed)

## 2020-12-16 NOTE — Evaluation (Signed)
Physical Therapy Evaluation and Discharge Patient Details Name: Anthony Daniel. MRN: [id number] DOB: 1951-03-09 Today's Date: 12/16/2020   History of Present Illness  Pt is a 70 year old male who presents s/p C3-C5 ACDF on 12/15/2020. PMH significant for prostate CA, bladder CA, latent autoimmune diabetes in adults, HTN, CK I.    Clinical Impression  Patient evaluated by Physical Therapy with no further acute PT needs identified. All education has been completed and the patient has no further questions. Pt was able to demonstrate transfers and ambulation with gross modified independence and no AD. Pt was educated on precautions, brace application/wearing schedule, appropriate activity progression, and car transfer. See below for any follow-up Physical Therapy or equipment needs. PT is signing off. Thank you for this referral.     Follow Up Recommendations No PT follow up;Supervision for mobility/OOB    Equipment Recommendations  None recommended by PT    Recommendations for Other Services       Precautions / Restrictions Precautions Precautions: Cervical Precaution Booklet Issued: Yes (comment) Required Braces or Orthoses: Cervical Brace Cervical Brace: Soft collar;For comfort Restrictions Weight Bearing Restrictions: No      Mobility  Bed Mobility Overal bed mobility: Modified Independent             General bed mobility comments: Pt was received sitting up EOB.    Transfers Overall transfer level: Modified independent Equipment used: None             General transfer comment: No assist to power-up to full standing position. No unsteadiness or LOB noted.  Ambulation/Gait Ambulation/Gait assistance: Modified independent (Device/Increase time) Gait Distance (Feet): 300 Feet Assistive device: None Gait Pattern/deviations: Step-through pattern;Decreased stride length;Trunk flexed Gait velocity: Decreased Gait velocity interpretation: <1.31 ft/sec, indicative  of household ambulator General Gait Details: VC's for optimal posture and maintenance of precautions.  Stairs Stairs: Yes Stairs assistance: Min guard Stair Management: One rail Right;Step to pattern;Forwards Number of Stairs: 1 General stair comments: To simulate 1 step into great room  Wheelchair Mobility    Modified Rankin (Stroke Patients Only)       Balance Overall balance assessment: No apparent balance deficits (not formally assessed)                                           Pertinent Vitals/Pain Pain Assessment: Faces Pain Score: 6  Faces Pain Scale: Hurts a little bit Pain Location: back of neck and traps Pain Descriptors / Indicators: Aching;Tender;Operative site guarding Pain Intervention(s): Limited activity within patient's tolerance;Monitored during session;Repositioned    Home Living Family/patient expects to be discharged to:: Private residence Living Arrangements: Spouse/significant other Available Help at Discharge: Family;Available 24 hours/day Type of Home: House Home Access: Level entry     Home Layout: Multi-level;Able to live on main level with bedroom/bathroom Home Equipment: Shower seat Additional Comments: Mother passed in March of this year, he can borrow her old DME if needed.    Prior Function Level of Independence: Independent               Hand Dominance   Dominant Hand: Right    Extremity/Trunk Assessment   Upper Extremity Assessment Upper Extremity Assessment: Defer to OT evaluation    Lower Extremity Assessment Lower Extremity Assessment: Overall WFL for tasks assessed    Cervical / Trunk Assessment Cervical / Trunk Assessment: Other exceptions Cervical /  Trunk Exceptions: s/p surgery  Communication   Communication: No difficulties  Cognition Arousal/Alertness: Awake/alert Behavior During Therapy: WFL for tasks assessed/performed Overall Cognitive Status: Within Functional Limits for tasks  assessed                                        General Comments      Exercises     Assessment/Plan    PT Assessment Patent does not need any further PT services  PT Problem List         PT Treatment Interventions      PT Goals (Current goals can be found in the Care Plan section)  Acute Rehab PT Goals Patient Stated Goal: Ready to return home PT Goal Formulation: With patient/family Time For Goal Achievement: 12/23/20 Potential to Achieve Goals: Good    Frequency     Barriers to discharge        Co-evaluation               AM-PAC PT "6 Clicks" Mobility  Outcome Measure Help needed turning from your back to your side while in a flat bed without using bedrails?: None Help needed moving from lying on your back to sitting on the side of a flat bed without using bedrails?: None Help needed moving to and from a bed to a chair (including a wheelchair)?: None Help needed standing up from a chair using your arms (e.g., wheelchair or bedside chair)?: None Help needed to walk in hospital room?: None Help needed climbing 3-5 steps with a railing? : A Little 6 Click Score: 23    End of Session Equipment Utilized During Treatment: Cervical collar Activity Tolerance: Patient tolerated treatment well Patient left: with call bell/phone within reach (Sitting EOB with wife present awaiting d/c) Nurse Communication: Mobility status PT Visit Diagnosis: Pain Pain - part of body:  (neck)    Time: 7017-7939 PT Time Calculation (min) (ACUTE ONLY): 17 min   Charges:   PT Evaluation $PT Eval Low Complexity: 1 Low          Rolinda Roan, PT, DPT Acute Rehabilitation Services Pager: [phone] Office: [phone]   Thelma Comp 12/16/2020, 1:34 PM

## 2020-12-17 LAB — HEMOGLOBIN A1C
Hgb A1c MFr Bld: 7.9 % — ABNORMAL HIGH (ref 4.8–5.6)
Mean Plasma Glucose: 180 mg/dL

## 2020-12-17 NOTE — Discharge Summary (Addendum)
Physician Discharge Summary  [name]. MRN: [id number] DOB/AGE: March 11, 1951 70 y.o.  Admit date: 12/15/2020 Discharge date: 12/16/2020   Admission Diagnoses:Cervical stenosis of spine, herniated cervical disc, cervical myelopathy C 34, C 45 levels  Discharge Diagnoses: Cervical stenosis of spine, herniated cervical disc, cervical myelopathy C 34, C 45 levels Active Problems:   Cervical stenosis of spinal canal   Discharged Condition: good  Hospital Course: The patient was admitted on 12/15/2020 and taken to the operating room where the patient underwent C3/4 and C4/5 ACDF. The patient tolerated the procedure well and was taken to the recovery room and then to the floor in stable condition. The hospital course was routine. There were no complications. The wound remained clean dry and intact. Pt had appropriate upper back soreness. No complaints of arm/leg pain or new N/T/W. The patient remained afebrile with stable vital signs, and tolerated a regular diet. The patient continued to increase activities, and pain was well controlled with oral pain medications.   Consults: None  Significant Diagnostic Studies: radiology: X-Ray: Intraoperative radiographs  Treatments: surgery: Cervical Three-Four Cervical Four-Five Anterior cervical decompression/discectomy/fusion (N/A) with PEEK cages, autograft, plate  Discharge Exam: Blood pressure (!) 152/78, pulse 70, temperature 98.1 F (36.7 C), temperature source Oral, resp. rate 18, height 6\' 1"  (1.854 m), weight 113.4 kg, SpO2 99 %.  Physical Exam: Patient is awake, A/O X 4, conversant, and in good spirits. Speech is fluent and appropriate. Doing well. MAEW with good strength that is symmetric bilaterally. 5/5 BUE/BLE. Dressing is clean dry intact. Incision is well approximated with no drainage, erythema, or edema. Soft cervical collar in use.   Disposition: Discharge disposition: 01-Home or Self Care        Allergies as  of 12/16/2020       Reactions   Altace [ramipril] Cough   Crestor [rosuvastatin] Other (See Comments)   Muscle aches, cramps   Lipitor [atorvastatin] Other (See Comments)   Muscle aches, cramps   Contrast Media [iodinated Diagnostic Agents] Itching, Nausea And Vomiting   PT TRIED THIS TWICE.EVEN WITH PRE-MEDS AND STILL HAD NAUSEA AND ITCHING--WAS TOLD HE CAN NOT TAKE iv ANYMORE   Dilaudid [hydromorphone] Nausea And Vomiting   Iodine    Betadine/iodine rash per patient   Latex Other (See Comments)   Irritation   Oxycodone Nausea Only   Pt can tolerate hydrocodone in small doses only!   Adhesive [tape] Rash   Peanut-containing Drug Products Anxiety, Other (See Comments)   Jittery        Medication List     TAKE these medications    aspirin EC 81 MG tablet Take 81 mg by mouth in the morning.   CALCIUM 500 + D3 PO Take 1 tablet by mouth in the morning and at bedtime.   candesartan 16 MG tablet Commonly known as: ATACAND TAKE 1 TABLET BY MOUTH EVERY DAY What changed: when to take this   Ciclopirox 0.77 % gel APPLY TO AFFECTED AREA TWICE A DAY What changed: See the new instructions.   Dexcom G6 Receiver Kerrin Mo See admin instructions.   Dexcom G6 Sensor Misc change sensor   Dexcom G6 Transmitter Misc See admin instructions.   ezetimibe 10 MG tablet Commonly known as: ZETIA TAKE 1 TABLET EVERY DAY What changed: when to take this   Gvoke HypoPen 2-Pack 1 MG/0.2ML Soaj Generic drug: Glucagon Inject 1 Act into the skin daily as needed. What changed: reasons to take this   HYDROcodone-acetaminophen 5-325 MG  tablet Commonly known as: NORCO/VICODIN Take 1 tablet by mouth every 4 (four) hours as needed for moderate pain ((score 4 to 6)).   insulin pump Soln Inject 1 each into the skin continuous. Novolog insulin   LANTUS SOLOSTAR Throop Inject 15 Units into the skin 2 (two) times daily as needed (when pump is not in use.).   levocetirizine 5 MG tablet Commonly known  as: XYZAL TAKE 1 TABLET (5 MG TOTAL) BY MOUTH DAILY AS NEEDED FOR ALLERGIES.   metaxalone 400 MG tablet Commonly known as: SKELAXIN Take 400 mg by mouth in the morning.   methocarbamol 500 MG tablet Commonly known as: ROBAXIN Take 1 tablet (500 mg total) by mouth every 6 (six) hours as needed for muscle spasms.   NovoLOG FlexPen 100 UNIT/ML FlexPen Generic drug: insulin aspart Inject into the skin. Via insulin pump   OneTouch Verio test strip Generic drug: glucose blood 5X A DAY   Potassium Citrate 15 MEQ (1620 MG) Tbcr Take 1 tablet by mouth in the morning and at bedtime.   SIMPLY SALINE NA Place 1-2 sprays into the nose 3 (three) times daily as needed (congestion/allergies).   SYSTANE OP Place 1 drop into both eyes 3 (three) times daily as needed (dry/irritated eyes).   tamsulosin 0.4 MG Caps capsule Commonly known as: FLOMAX Take 0.4 mg by mouth in the morning.         Signed: Marvis Moeller, DNP, NP-C 12/17/2020, 10:21 AM

## 2020-12-24 NOTE — H&P (Signed)
Erline Levine, MD  Physician  Neurosurgery  Interval H&P Note   Signed  Date of Service: 12/15/2020 2:15 PM          Signed     Paris Lore, MD  Physician  Neurosurgery  H&P   Signed  Date of Service: 12/08/2020 4:02 PM          Signed     Today, he reports progressive right-sided body weakness and "throbbing" since early December 2021. He denies injury. He states cervical and right shoulder symptoms are similar to his 2020 visit; but right arm symptoms are worse and right leg symptoms are new.  He continues to smoke 1 pack per day  No EtOH or substance abuse history other than tobacco  History: Hypertension, diabetes, bladder cancer, hypercholesterolemia, nephrolithiasis  Surgical history: Bladder 1998, "several" nephrolithotomies (2017-2021), cystoscopy and ureteroscopy 2016 for kidney stones  Advil OTC taken p.r.n.  Cervical MRI 10/06/2020 on canopy  The patient has a 7 mm spinal canal at C4-5 and an 8 mm spinal canal at the C3-4 level.  He has right hand intrinsic weakness at 4/5 and right finger extensor weakness at 4/5  The patient has been diagnosed with prostate cancer and will likely require seed implantation.  PAST MEDICAL/SURGICAL HISTORY: (Reviewed, updated)  Disease/disorder  Onset Date  Management  Date  Comments  Nephrolithotomy percutaneous  Colonoscopy  Arthritis  Cancer, bladder  Diabetes  High cholesterol  Hypertension  Kidney stone  PAST MEDICAL HISTORY, SURGICAL HISTORY, FAMILY HISTORY, SOCIAL HISTORY AND REVIEW OF SYSTEMS  I have reviewed the patient's past medical, surgical, family and social history as well as the comprehensive review of systems as included on the [facility] history form dated 11/12/2020, which I have signed.  Family History:  (Reviewed, updated)  Relationship  Family Member Name  Deceased  Age at Death  Condition  Onset Age  Cause of Death   Family history of Diabetes mellitus  N  Family history of Cancer, prostate  N  Family history of Hypertension  N  Family history of heart failure  N  Social History: (Reviewed, updated)  Tobacco use reviewed.  Preferred language is [place].  Tobacco use status: Heavy cigarette smoker (20-39 cigs/day).  Smoking status: Heavy tobacco smoker.  SMOKING STATUS  Type  Smoking Status  Usage Per Day  Years Used  Total Pack Years  Cigarette  Heavy tobacco smoker  1 Packs  20  20  TOBACCO CESSATION INFORMATION  Date  Counseled By  Order  Status  Description  Code  Tobacco Cessation Information  11/16/2020  Rushie Chestnut  Tobacco cessation counseling  completed  Smoking cessation education  MEDICATIONS: (added, continued or stopped this visit)  Started  Medication  Directions  Instruction  Stopped  Arnuity Ellipta 50 mcg/actuation powder for inhalation  aspirin 81 mg tablet,delayed release  take 1 tablet by oral route every day  cholecalciferol (vitamin D3) 50 mcg (2,000 unit) capsule  ciclopirox 0.77 % topical gel  apply by topical route 2 times every day to the affected and surrounding areas of skin in the morning and evening  cyanocobalamin (vitamin B-12) 3,000 mcg capsule  diclofenac 1 % topical gel  apply (2G) by topical route 2 times every day to the affected area(s)  ezetimibe 10 mg tablet  take 1  tablet by oral route every day  Lantus Solostar U-100 Insulin 100 unit/mL (3 mL) subcutaneous pen  inject by subcutaneous route as per insulin protocol  levocetirizine 5 mg tablet  take 1 tablet by oral route every day in the evening  Livalo 2 mg tablet  take 1 tablet by oral route every day  meloxicam 15 mg tablet  take 1 tablet by oral route every day  metaxalone 400 mg tablet  take 2 tablet by oral route 3 times every day as needed  Novolog Mix 70-30 U-100 Insulin 100 unit/mL subcutaneous solution  inject by subcutaneous route as per insulin protocol   polyethylene glycol 3350 17 gram oral powder packet  take 1 packet by oral route every day mixed with 8 oz. water, juice, soda, coffee or tea  potassium citrate ER 15 mEq (1,620 mg) tablet,extended release  take 2 tablet by oral route 2 times every day with food  ramipril 10 mg capsule  take 1 capsule by oral route every day  sildenafil (pulmonary hypertension) 20 mg tablet  take 1 tablet by oral route 3 times every day  tamsulosin 0.4 mg capsule  take 1 capsule by oral route every day 1/2 hour following the same meal each day  ALLERGIES:  Ingredient  Reaction  Medication Name  Comment  IODINATED CONTRAST MEDIA  Nausea; Vomiting; itching  ATORVASTATIN CALCIUM  Muscle pain; Cramp  Lipitor  ADHESIVE TAPE  Rash  PEANUT EXTRACT  Anxiety; jittery  ROSUVASTATIN CALCIUM  Muscle pain; Cramp  Crestor  OXYCODONE  Nausea  LATEX  Skin irritation  Reviewed, no changes.  PHYSICAL EXAM:  Vitals  Date  Temp F  BP  Pulse  Ht In  Wt Lb  BMI  BSA  Pain Score  11/16/2020  128/77  91  73  250  32.98  6/10  IMPRESSION:  Severe spinal stenosis with cervical myelopathy on examination. I have recommended proceeding with anterior cervical decompression and fusion at the C3-4 and C4-5 levels. Timing will need to be sorted out in relation to his prostate cancer diagnosis and plan treatment for that.  PLAN:  Proceed with anterior cervical decompression and fusion at the C3-4 and C4-5 levels for cervical stenosis and symptomatic cervical myelopathy. Risks and benefits were discussed in detail with the patient and he wishes to proceed with surgery. Patient education was performed in detail today.  Orders:  Instruction(s)/Education:  Assessment  Instruction  Tobacco cessation counseling  I10  Lifestyle education  [phone]  Dietary management education, guidance, and counseling  Completed Orders (this encounter)  Order  Details  Reason  Side  Interpretation  Result  Initial  Treatment Date  Region  Tobacco cessation counseling  Lifestyle education  Patient will follow up with Primary Care Physician.  Dietary management education, guidance, and counseling  Encouraged patient to eat well balanced diet.  Assessment/Plan  #  Detail Type  Description  1.  Assessment  Herniated nucleus pulposus, cervical (M50.20).  2.  Assessment  Cervical radiculopathy (M54.12).  3.  Assessment  Degenerative disc disease, cervical (M50.30).  4.  Assessment  Cervical spondylosis with radiculopathy (M47.22).  5.  Assessment  Cervicalgia (M54.2).  6.  Assessment  Essential (primary) hypertension (I10).  7.  Assessment  Body mass index (BMI) 32.0-32.9, adult (G81.85).  Plan Orders  Today's instructions / counseling include(s) Dietary management education, guidance, and counseling. Clinical information/comments: Encouraged patient to eat well balanced diet.  Pain Management Plan  Pain Scale: 6/10.  Method: Numeric  Pain Intensity Scale.  Location: back.  Onset: 02/15/2018.  Duration: varies.  Quality: discomforting.  Pain management follow-up plan of care: Patient will continue medication management..  Provider: Marchia Meiers. Vertell Limber MD 11/20/2020 06:22 PM  Dictation edited by: Marchia Meiers. Vertell Limber  CC Providers:  Scarlette Calico  [facility]  [place], [place] [id number]  David Spivey  [facility]  [address] [place]  [place], [place] [id number]-  Electronically signed by Marchia Meiers. Vertell Limber MD on 11/20/2020 06:22 PM    Routing History

## 2020-12-28 ENCOUNTER — Telehealth: Payer: Self-pay | Admitting: *Deleted

## 2020-12-28 NOTE — Telephone Encounter (Signed)
RETURNED PATIENT'S PHONE CALL, SPOKE WITH PATIENT. ?

## 2020-12-30 ENCOUNTER — Other Ambulatory Visit (HOSPITAL_COMMUNITY): Payer: Self-pay | Admitting: *Deleted

## 2021-01-03 NOTE — Progress Notes (Signed)
  Radiation Oncology         [phone]) [phone] ________________________________  Name: Anthony Daniel. MRN: [id number]  Date: 01/07/2021  DOB: March 31, 1951  SIMULATION AND TREATMENT PLANNING NOTE PUBIC ARCH STUDY  JG:GEZMO, Arvid Right, MD  Irine Seal, MD  DIAGNOSIS:  70 y.o. gentleman with stage T1c adenocarcinoma of the prostate with a Gleason's score of 4+3 and a PSA of 7.64  Oncology History  Prostate cancer ([place])  09/06/2018 Initial Diagnosis   Prostate cancer ([place])    09/23/2020 Cancer Staging   Staging form: Prostate, AJCC 8th Edition - Clinical stage from 09/23/2020: Stage IIC (cT1c, cN0, cM0, PSA: 6, Grade Group: 3) - Signed by Freeman Caldron, PA-C on 10/25/2020  Histopathologic type: Adenocarcinoma, NOS  Stage prefix: Initial diagnosis  Prostate specific antigen (PSA) range: Less than 10  Gleason primary pattern: 4  Gleason secondary pattern: 3  Gleason score: 7  Histologic grading system: 5 grade system  Number of biopsy cores examined: 12  Number of biopsy cores positive: 4  Location of positive needle core biopsies: Both sides      No diagnosis found.  COMPLEX SIMULATION:  The patient presented today for evaluation for possible prostate seed implant. He was brought to the radiation planning suite and placed supine on the CT couch. A 3-dimensional image study set was obtained in upload to the planning computer. There, on each axial slice, I contoured the prostate gland. Then, using three-dimensional radiation planning tools I reconstructed the prostate in view of the structures from the transperineal needle pathway to assess for possible pubic arch interference. In doing so, I did not appreciate any pubic arch interference. Also, the patient's prostate volume was estimated based on the drawn structure. The volume was 41 cc.  His previous TRUS volume was 41 cc.  Given the pubic arch appearance and prostate volume, patient remains a good candidate to proceed with  prostate seed implant. Today, he freely provided informed written consent to proceed.    PLAN: The patient will undergo prostate seed implant.   ________________________________  Sheral Apley. Tammi Klippel, M.D.

## 2021-01-04 DIAGNOSIS — M5020 Other cervical disc displacement, unspecified cervical region: Secondary | ICD-10-CM | POA: Diagnosis not present

## 2021-01-04 DIAGNOSIS — Z6832 Body mass index (BMI) 32.0-32.9, adult: Secondary | ICD-10-CM | POA: Diagnosis not present

## 2021-01-04 DIAGNOSIS — G959 Disease of spinal cord, unspecified: Secondary | ICD-10-CM | POA: Diagnosis not present

## 2021-01-04 DIAGNOSIS — M5412 Radiculopathy, cervical region: Secondary | ICD-10-CM | POA: Diagnosis not present

## 2021-01-06 ENCOUNTER — Telehealth: Payer: Self-pay | Admitting: *Deleted

## 2021-01-06 ENCOUNTER — Other Ambulatory Visit (HOSPITAL_COMMUNITY): Payer: Self-pay | Admitting: *Deleted

## 2021-01-06 NOTE — Telephone Encounter (Signed)
CALLED PATIENT TO REMIND OF PRE-SEED APPTS. FOR 01-07-21, SPOKE WITH PATIENT AND HE IS AWARE OF THESE APPTS.

## 2021-01-07 ENCOUNTER — Ambulatory Visit
Admission: RE | Admit: 2021-01-07 | Discharge: 2021-01-07 | Disposition: A | Discharge status: Home / Self Care | Payer: Medicare Other | Source: Ambulatory Visit | Attending: Urology | Admitting: Urology

## 2021-01-07 ENCOUNTER — Ambulatory Visit
Admission: RE | Admit: 2021-01-07 | Discharge: 2021-01-07 | Disposition: A | Discharge status: Home / Self Care | Payer: Medicare Other | Source: Ambulatory Visit | Attending: Radiation Oncology | Admitting: Radiation Oncology

## 2021-01-07 ENCOUNTER — Ambulatory Visit (HOSPITAL_COMMUNITY)
Admission: RE | Admit: 2021-01-07 | Discharge: 2021-01-07 | Disposition: A | Discharge status: Home / Self Care | Payer: Medicare Other | Source: Ambulatory Visit | Attending: Urology | Admitting: Urology

## 2021-01-07 ENCOUNTER — Other Ambulatory Visit: Payer: Self-pay

## 2021-01-07 ENCOUNTER — Encounter (HOSPITAL_COMMUNITY)
Admission: RE | Admit: 2021-01-07 | Discharge: 2021-01-07 | Disposition: A | Discharge status: Home / Self Care | Payer: Medicare Other | Source: Ambulatory Visit | Attending: Urology | Admitting: Urology

## 2021-01-07 ENCOUNTER — Encounter: Payer: Self-pay | Admitting: Urology

## 2021-01-07 DIAGNOSIS — C61 Malignant neoplasm of prostate: Secondary | ICD-10-CM | POA: Insufficient documentation

## 2021-01-07 DIAGNOSIS — Z01818 Encounter for other preprocedural examination: Secondary | ICD-10-CM | POA: Diagnosis not present

## 2021-01-07 NOTE — Progress Notes (Signed)
Patient in for pre seed appointment, meaningful use questions complete. Patient AUA score is 5.

## 2021-01-21 ENCOUNTER — Telehealth: Payer: Self-pay | Admitting: *Deleted

## 2021-01-21 NOTE — Telephone Encounter (Signed)
CALLED PATIENT TO REMIND OF LAB APPT. FOR 02-05-21, SPOKE WITH PATIENT AND HE IS AWARE OF THIS APPT.

## 2021-01-26 DIAGNOSIS — C61 Malignant neoplasm of prostate: Secondary | ICD-10-CM | POA: Diagnosis not present

## 2021-01-27 ENCOUNTER — Other Ambulatory Visit: Payer: Self-pay | Admitting: Internal Medicine

## 2021-01-27 DIAGNOSIS — J301 Allergic rhinitis due to pollen: Secondary | ICD-10-CM

## 2021-01-27 DIAGNOSIS — E785 Hyperlipidemia, unspecified: Secondary | ICD-10-CM

## 2021-01-28 DIAGNOSIS — E119 Type 2 diabetes mellitus without complications: Secondary | ICD-10-CM | POA: Diagnosis not present

## 2021-02-02 ENCOUNTER — Encounter (HOSPITAL_BASED_OUTPATIENT_CLINIC_OR_DEPARTMENT_OTHER): Payer: Self-pay | Admitting: Urology

## 2021-02-02 ENCOUNTER — Other Ambulatory Visit: Payer: Self-pay

## 2021-02-02 NOTE — Progress Notes (Addendum)
ADDENDUM:  Called pt's endocrinologist, Dr Buddy Duty , office and requested lov note from 02-04-2021.  Received via fax , placed in chart, after updating med list.   Spoke w/ via phone for pre-op interview--- Pt Lab needs dos----  no             Lab results------ pt getting CBC/ CMP / PT/ PTT done 02-05-2021 ;  current ekg/ cxr in epic/ chart COVID test -----patient states asymptomatic no test needed  Arrive at ------- 0530 on 02-09-2021 NPO after MN NO Solid Food.  Clear liquids from MN until--- 0430 Med rec completed Medications to take morning of surgery ----- Zetia, Flomax, Skelaxin  Diabetic medication ----- only do half dose lantus insulin night before surgery and do not no lantus morning of surgery ;  if using insulin pump please leave pump on   Patient instructed no nail polish to be worn day of surgery Patient instructed to bring photo id and insurance card day of surgery Patient aware to have Driver (ride ) / caregiver  for 24 hours after surgery --wife, Anthony Daniel  Patient Special Instructions ----- asked pt have dexcom and insulin pump supplies available dos. Will do one fleet enema morning of surgery.  Pre-Op special Istructions ----- currently pt has dexcom LUQ, pt told to leave on, and currently not using insulin pump but he were to have it on dos to leave pump on and running.  Also, pt advised to call his endocrinologist for instructions for surgery (pt stated has appt 02-04-2021, will request office note to be faxed)  Patient verbalized understanding of instructions that were given at this phone interview. Patient denies shortness of breath, chest pain, fever, cough at this phone interview.    Anesthesia Review: HTN; LADA managed as type 1 ;  CKD 1;  recent ACDF C3--5 @[facility]  on 12-15-2020;  pt denies cardiac s&s, sob, and no peripheral swelling.  PCP: Dr T. Thoms Endocrinologist:  Dr Buddy Duty (pt stated has appt 02-04-2021 prior to surgery,  will request note to be placed on  chart) Chest x-ray : 01-07-2021 epic EKG : 12-15-2020 epic Echo : no Stress test: no Cardiac Cath : no Activity level: denies sob w/ any activity Sleep Study/ CPAP : No Fasting Blood Sugar :  130--150    / Checks Blood Sugar -- times a day:  multiple times Blood Thinner/ Instructions /Last Dose: NO ASA / Instructions/ Last Dose :  ASA 81mg / pt stated given instructions by office to stop prior to surgery

## 2021-02-04 DIAGNOSIS — E785 Hyperlipidemia, unspecified: Secondary | ICD-10-CM | POA: Diagnosis not present

## 2021-02-04 DIAGNOSIS — E1022 Type 1 diabetes mellitus with diabetic chronic kidney disease: Secondary | ICD-10-CM | POA: Diagnosis not present

## 2021-02-04 DIAGNOSIS — N182 Chronic kidney disease, stage 2 (mild): Secondary | ICD-10-CM | POA: Diagnosis not present

## 2021-02-04 DIAGNOSIS — R7309 Other abnormal glucose: Secondary | ICD-10-CM | POA: Diagnosis not present

## 2021-02-04 DIAGNOSIS — Z720 Tobacco use: Secondary | ICD-10-CM | POA: Diagnosis not present

## 2021-02-04 DIAGNOSIS — Z794 Long term (current) use of insulin: Secondary | ICD-10-CM | POA: Diagnosis not present

## 2021-02-05 ENCOUNTER — Encounter (HOSPITAL_BASED_OUTPATIENT_CLINIC_OR_DEPARTMENT_OTHER): Payer: Self-pay | Admitting: Urology

## 2021-02-05 ENCOUNTER — Other Ambulatory Visit: Payer: Self-pay

## 2021-02-05 ENCOUNTER — Encounter (HOSPITAL_COMMUNITY)
Admission: RE | Admit: 2021-02-05 | Discharge: 2021-02-05 | Disposition: A | Discharge status: Home / Self Care | Payer: Medicare Other | Source: Ambulatory Visit | Attending: Urology | Admitting: Urology

## 2021-02-05 DIAGNOSIS — Z01812 Encounter for preprocedural laboratory examination: Secondary | ICD-10-CM | POA: Insufficient documentation

## 2021-02-05 LAB — COMPREHENSIVE METABOLIC PANEL
ALT: 16 U/L (ref 0–44)
AST: 16 U/L (ref 15–41)
Albumin: 3.8 g/dL (ref 3.5–5.0)
Alkaline Phosphatase: 86 U/L (ref 38–126)
Anion gap: 11 (ref 5–15)
BUN: 16 mg/dL (ref 8–23)
CO2: 25 mmol/L (ref 22–32)
Calcium: 9.1 mg/dL (ref 8.9–10.3)
Chloride: 103 mmol/L (ref 98–111)
Creatinine, Ser: 0.87 mg/dL (ref 0.61–1.24)
GFR, Estimated: >60 mL/min (ref >60)
Glucose, Bld: 190 mg/dL — ABNORMAL HIGH (ref 70–99)
Potassium: 4.1 mmol/L (ref 3.5–5.1)
Sodium: 139 mmol/L (ref 135–145)
Total Bilirubin: 0.7 mg/dL (ref 0.3–1.2)
Total Protein: 6.9 g/dL (ref 6.5–8.1)

## 2021-02-05 LAB — CBC
HCT: 42.4 % (ref 39.0–52.0)
Hemoglobin: 13.9 g/dL (ref 13.0–17.0)
MCH: 29.8 pg (ref 26.0–34.0)
MCHC: 32.8 g/dL (ref 30.0–36.0)
MCV: 91 fL (ref 80.0–100.0)
Platelets: 187 10*3/uL (ref 150–400)
RBC: 4.66 MIL/uL (ref 4.22–5.81)
RDW: 14.8 % (ref 11.5–15.5)
WBC: 9 10*3/uL (ref 4.0–10.5)
nRBC: 0 % (ref 0.0–0.2)

## 2021-02-05 LAB — PROTIME-INR
INR: 0.9 (ref 0.8–1.2)
Prothrombin Time: 12.1 seconds (ref 11.4–15.2)

## 2021-02-05 LAB — APTT: aPTT: 29 seconds (ref 24–36)

## 2021-02-08 ENCOUNTER — Telehealth: Payer: Self-pay | Admitting: *Deleted

## 2021-02-08 NOTE — Anesthesia Preprocedure Evaluation (Addendum)
Anesthesia Evaluation  Patient identified by MRN, date of birth, ID band Patient awake    Reviewed: Allergy & Precautions, NPO status , Patient's Chart, lab work & pertinent test results  History of Anesthesia Complications (+) PONV  Airway Mallampati: II  TM Distance: >3 FB Neck ROM: Limited   Comment: Pt S/P Cervical Surgery 10/28/20 no associated arm numbness limited ROM of Neck to side Dental no notable dental hx. (+) Implants, Teeth Intact, Dental Advisory Given   Pulmonary Current SmokerPatient did not abstain from smoking.,    Pulmonary exam normal breath sounds clear to auscultation       Cardiovascular hypertension, Pt. on medications Normal cardiovascular exam Rhythm:Regular Rate:Normal     Neuro/Psych negative neurological ROS     GI/Hepatic negative GI ROS, Neg liver ROS,   Endo/Other  diabetes, Type 1, Insulin Dependent  Renal/GU Renal diseaseLab Results      Component                Value               Date                      CREATININE               0.87                02/05/2021                BUN                      16                  02/05/2021                NA                       139                 02/05/2021                K                        4.1                 02/05/2021                CL                       103                 02/05/2021                CO2                      25                  02/05/2021              Prostate CA    Musculoskeletal  (+) Arthritis ,   Abdominal (+) + obese (BMI 32.85),   Peds  Hematology Lab Results      Component                Value               Date  WBC                      9.0                 02/05/2021                HGB                      13.9                02/05/2021                HCT                      42.4                02/05/2021                MCV                      91.0                02/05/2021                 PLT                      187                 02/05/2021              Anesthesia Other Findings All: Altace, crestor, Lipitor, oxycodone (N/V) dilaudid (n/v)Latex  Reproductive/Obstetrics                            Anesthesia Physical Anesthesia Plan  ASA: 3  Anesthesia Plan: General   Post-op Pain Management:    Induction: Intravenous  PONV Risk Score and Plan: 3 and Treatment may vary due to age or medical condition, Ondansetron, Midazolam and Dexamethasone  Airway Management Planned: LMA  Additional Equipment: None  Intra-op Plan:   Post-operative Plan:   Informed Consent: I have reviewed the patients History and Physical, chart, labs and discussed the procedure including the risks, benefits and alternatives for the proposed anesthesia with the patient or authorized representative who has indicated his/her understanding and acceptance.     Dental advisory given  Plan Discussed with: CRNA and Anesthesiologist  Anesthesia Plan Comments: (LMA GA)       Anesthesia Quick Evaluation

## 2021-02-08 NOTE — H&P (Signed)
Pt presents today for pre-operative history and physical exam in anticipation of brachytherapy and space oar placement by Dr. Jeffie Pollock on 02/09/21. He is doing well and is without complaint.   Pt denies F/C, HA, CP, SOB, N/V, diarrhea/constipation, back pain, flank pain, hematuria, and dysuria.     HX:   CC: Prostate Cancer   Physician requesting consult: Dr. Irine Seal  PCP: Dr. Scarlette Calico  Location of consult: [facility]   Anthony Daniel is a 70 year old gentleman with a past medical history significant for hypertension, diabetes, hyperlipidemia, urolithiasis, and bladder cancer. His bladder cancer was presumably non-muscle invasive initially diagnosed in 1998 and he has been disease free since then with his last cystoscopy in May of 2021. He has a family history of prostate cancer with his father having died of metastatic prostate cancer. He has been undergoing routine PSA screening and had a negative prostate biopsy in 2015.   In December 2019, his PSA increased to 4.14 prompting another TRUS biopsy on 08/22/18 by Dr. Jeffie Pollock. This demonstrated Gleason 3+3=6 adenocarcinoma in 5% of one biopsy core. He proceeded with active surveillance. An MRI of the prostate was performed in April 2021 and was unremarkable for any suspicious lesions. His surveillance follow up was complicated by multiple kidney stone procedures in [place] and at [place]. A repeat confirmatory biopsy was finally performed on 09/23/20. His PSA at this time was 5.98. This biopsy indicated upgraded Gleason 4+3=7 adenocarcinom with 4 out of 12 biopsy cores positive for malignancy.   Family history: Father died of metastatic prostate cancer.   Imaging studies:   PMH: He has a history of diabetes, hypertension, hyperlipidemia, urolithiasis, and bladder cancer. He has multiple allergies listed including LATEX. However, his other "allergies" to narcotic medications and contrast are GI  upset rather than true allergies.  PSH: No abdominal surgeries.   TNM stage: cT1c Nx Mx  PSA: 5.98  Gleason score: 4+3=7 (GG 3)  Biopsy (09/23/20): 4/12 cores positive  Left: L lateral mid (5%, 4+3=7)  Right: R apex (10%, 4+3=7), R mid (5%, 3+3=6), R lateral mid (10%, 3+4=7)  Prostate volume: 41 cc   Nomogram  OC disease: 47%  EPE: 49%  SVI: 8%  LNI: 8%  PFS (5 year, 10 year): 67%, 52%   Urinary function: He denies significant voiding symptoms.  Erectile function: He does have severe refractory erectile dysfunction. He does use intracavernosal injection therapy for treatment.     ALLERGIES: Contrast Media Ready-Box MISC - Nausea Dilaudid Iodine - Nausea Latex Gloves MISC - Skin Rash oxycodone - Nausea rosuvastatin - cramping Statins      Notes: atorvastatin cramping  tape  ramipril cough  l    MEDICATIONS: Aspirin  Tamsulosin Hcl 0.4 mg capsule 1 capsule PO Daily  Ciclopirox  Ecotrin 325 mg tablet, delayed release Oral  Fluticasone Propionate 50 mcg/actuation spray, suspension Nasal  HumaLOG 100 UNIT/ML Subcutaneous Solution Subcutaneous  Indapamide 1.25 mg tablet 1 tablet PO Daily  Irbesartan  Lantus  Levofloxacin 750 mg tablet 1 po 1 hour prior to the procedure  Livalo  Metaxalone  Novolog  Sildenafil Citrate 20 mg tablet 1-5 tablets as needed  Systane Ultra SOLN Ophthalmic  Tramadol Hcl  Xyzal  Zetia 10 mg tablet Oral     GU PSH: Cysto Remove Stent FB Sim - 12/03/2019, 01/30/2019 Endoscopy Ureter, Right - 11/28/2019 ESWL - 2012, 2012, 2008 PCNL, Right - 11/26/2019 Percut Stone Removal >2cm - 2017  Prostate Needle Biopsy - 09/23/2020, 2020 Stone Removal Nephrost Tube, Right - 11/28/2019 Ureteroscopic laser litho, Right - 10/24/2019, Right - 2020, Left - 2020, 2016       PSH Notes: Dental Surgery, Percutaneous Lithotomy For Stone Over 2cm., Cystoscopy Ureteroscopy Lithotripsy Incl Insert Indwelling Ureter Stent, Preventive medication therapy needed, Lithotripsy,  Lithotripsy, Lithotripsy, Cystoscopy Bladder Tumor   cervical disc replacement 12/15/20   NON-GU PSH: Cystourethroscopy, With Ureteroscopy And/or Pyeloscopy; With Endoscopic Laser Treatment Of Ureteral NJX PX ANTEGRDE NFROSGRM &/URTRGRM EXSTNG ACESS, Right - 11/28/2019 Surgical Pathology, Gross And Microscopic Examination For Prostate Needle - 09/23/2020, 2020     GU PMH: Prostate Cancer (Stable) - 10/23/2020, He has T1c Nx Mx Gleason 7(4+3) prostate cancer with grade progression on surveillance. He has BPH with moderate LUTS and ED. He has a history of superficial bladder cancer as well. I discussed the options for active treatment since he has progressed on surveillance. I reviewed the risks and benefits of RALP, EXRT (I mentioned several forms) Brachytherapy ( both permanent and HDR options) Cryotherapy and HIfU and briefly discussed ADT in association with EXRT and the side effects of that therapy. He is leaning toward radiation therapy option and we did discuss with increased risks of secondary malignancy, particularly in the face of his history of bladder cancer. I discussed the use of SpaceOAR and the associated risks and benefits. He is schedule for an appointment with the [facility] on 10/23/20. He is on the borderline for needing staging studies with his unfavorable intermediate risk but low volume disease and LNI probability of only 8% so I will defer any decisions regarding staging studies to the [facility]. He had questions about an outside second opinioin and I suggested [facility] since he is already established with Dr. Kandee Keen at [facility] for his history of stones. , - 10/13/2020, I will call with the results. , - 09/23/2020, His PSA is stable but he is over 18 months out from his last biopsy so I will get him set up for a surveillance biopsy and will send levaquin and have him get rocephin as well. Risks of bleeding, infection and voiding difficulty reviewed. , - 04/06/2020, - 10/24/2019, His PSA is up to 6.08. I am  going to repeat the PSA in 3 months and if it continues to rise, I will get him set up for a surveillance biopsy. He had very low risk disease on his biopsy a year ago. , - 08/26/2019, He has very low risk disease and his PSA is coming back down. Repeat PSA and exam in 6 months. , - 03/06/2019, His PSA jumped but that is most consistent with a reaction to the biopsy. I will repeat in 3 months. , - 2020, He has very low risk, Gleason 6(3+3) T1c Nx Mx prostate cancer with BPH with moderate LUTS and ED. I discussed Active surveillance, RALP, EXRT and Seeds and briefly mentioned cryotherapy and HIFU. He is an excellent candidate for active surveillance and would like to pursue that option for the time being. , - 2020 History of urolithiasis - 09/23/2020, He is doing well since his last procedure but did pass a small stone today. I have refilled the potassium citrate. , - 04/06/2020 Microscopic hematuria, He has 3-10 RBC's and a possible urethral stone on [place]. He was told to keep an eye out. - 09/23/2020, Asymptomatic microscopic hematuria, - 2016 BPH w/LUTS, His voiding symptoms have improved with stone removal. he will continue the tamsulosin. - 04/06/2020, Benign prostatic hyperplasia  with urinary obstruction, - 2017 Elevated PSA - 04/06/2020, - 2020, His PSA continues to rise slowly. I will repeat this again in 3-4 months and if it continues to rise we will need to consider a repeat biopsy., - 2019, His PSA is down a bit. I will repeat in 6 months. , - 2019, His PSA is minimally changed. I will have him return in 3 months with another PSA with a week of abstinence since he didn't do that this time. , - 2019, His PSA is up a bit more. THE MRIP was negative earlier this year and he had a negative biopsy in 2015. I will repeat a PSA in 3 months with a week of abstinence. , - 2018 (Worsening), His PSA is up again with a low f/t ratio. I am going to get an MRI and will do a fusion biopsy if positive. If negative, he will return  in 6 months with a PSA. , - 2018 (Stable), His PSA has come down from the 4 level. His PSADT is about 4 years. , - 2017, Elevated prostate specific antigen (PSA), - 0000000 Renal colic - 0000000 Renal calculus - Nov 21, 2019, - 11/14/19, He has passed several stones since his last visit and the stone burden appears reduced on KUB today. He is on indapamide from his PCP for his HTN and that should help with the stones as well. He will return in 6 months with a KUB. , - 08/26/2019, He has reduced stone burden but is not completely free of stones. He will continue the potassium citrate and I reviewed the dietary restrictions with him as well. BMP and KUB in 6 months. , - 03/06/2019, He has no hydro but has bilateral residual stone disease. I am going to have him return in 3 months with a KUB and we will discuss ESWL for the RLP stone at that time. , - 2020, Nephrolithiasis, - 2017, Kidney stone on left side, - 2017, Kidney stone on right side, - 2016 Ureteral calculus - 2019/11/14 ED due to arterial insufficiency, Erectile dysfunction due to arterial insufficiency - 2017 Weak Urinary Stream, Weak urinary stream - 2017 Other microscopic hematuria, Microscopic hematuria - 2016 History of bladder cancer, Bladder Cancer - 2014      PMH Notes:  2008-06-06 12:27:08 - Note: Nephrolithiasis Of The Left Kidney   DM I   NON-GU PMH: Tinea cruris, HE has a new complaint of a groin rash that is probably candidal with his history of diabetes. Nystop sent to the pharmacy. - 2018 Encounter for general adult medical examination without abnormal findings, Encounter for preventive health examination - 2017 Personal history of other diseases of the circulatory system, History of hypertension - 2014 Personal history of other endocrine, nutritional and metabolic disease, History of type 2 diabetes mellitus - 2014, History of hypercholesterolemia, - 2014    FAMILY HISTORY: Atrial Fibrillation - Father Cardiac Failure -  Father Family Health Status Number - Runs In Family    Notes: CaP father    SOCIAL HISTORY: Marital Status: Married Preferred Language: English; Race: Black or African American Current Smoking Status: Patient smokes. Has smoked since 03/18/1986. Smokes 1/2 pack per day.  Does not use smokeless tobacco. Social Drinker.  Does not use drugs. Drinks 2 caffeinated drinks per day. Has not had a blood transfusion. Patient's occupation is/was Retired.     Notes: ETOH maybe once per week  trying to quit smoking    REVIEW OF SYSTEMS:    GU Review  Male:   Patient denies frequent urination, hard to postpone urination, burning/ pain with urination, get up at night to urinate, leakage of urine, stream starts and stops, trouble starting your stream, have to strain to urinate , erection problems, and penile pain.  Gastrointestinal (Upper):   Patient denies nausea, vomiting, and indigestion/ heartburn.  Gastrointestinal (Lower):   Patient denies diarrhea and constipation.  Constitutional:   Patient denies fever, night sweats, weight loss, and fatigue.  Skin:   Patient denies skin rash/ lesion and itching.  Eyes:   Patient denies blurred vision and double vision.  Ears/ Nose/ Throat:   Patient denies sore throat and sinus problems.  Hematologic/Lymphatic:   Patient denies swollen glands and easy bruising.  Cardiovascular:   Patient denies leg swelling and chest pains.  Respiratory:   Patient denies shortness of breath and cough.  Endocrine:   Patient denies excessive thirst.  Musculoskeletal:   Patient denies back pain and joint pain.  Neurological:   Patient denies headaches and dizziness.  Psychologic:   Patient denies depression and anxiety.   VITAL SIGNS:      01/26/2021 01:42 PM  Weight 250 lb / 113.4 kg  Height 72 in / 182.88 cm  BP 127/76 mmHg  Pulse 84 /min  Temperature 98.0 F / 36.6 C  BMI 33.9 kg/m   MULTI-SYSTEM PHYSICAL EXAMINATION:    Constitutional: Well-nourished. No physical  deformities. Normally developed. Good grooming.  Neck: Neck symmetrical, not swollen. Normal tracheal position.  Respiratory: Normal breath sounds. No labored breathing, no use of accessory muscles.   Cardiovascular: Regular rate and rhythm. No murmur, no gallop.   Lymphatic: No enlargement of neck, axillae, groin.  Skin: No paleness, no jaundice, no cyanosis. No lesion, no ulcer, no rash.  Neurologic / Psychiatric: Oriented to time, oriented to place, oriented to person. No depression, no anxiety, no agitation.  Gastrointestinal: No mass, no tenderness, no rigidity, non obese abdomen.  Eyes: Normal conjunctivae. Normal eyelids.  Ears, Nose, Mouth, and Throat: Left ear no scars, no lesions, no masses. Right ear no scars, no lesions, no masses. Nose no scars, no lesions, no masses. Normal hearing. Normal lips.  Musculoskeletal: Normal gait and station of head and neck.     Complexity of Data:  Records Review:   Previous Patient Records  Urine Test Review:   Urinalysis   01/26/21  Urinalysis  Urine Appearance Clear   Urine Color Yellow   Urine Glucose 1+ mg/dL  Urine Bilirubin Neg mg/dL  Urine Ketones Neg mg/dL  Urine Specific Gravity 1.020   Urine Blood Neg ery/uL  Urine pH <=5.0   Urine Protein Neg mg/dL  Urine Urobilinogen 0.2 mg/dL  Urine Nitrites Neg   Urine Leukocyte Esterase Neg leu/uL   PROCEDURES:          Urinalysis Dipstick Dipstick Cont'd  Color: Yellow Bilirubin: Neg mg/dL  Appearance: Clear Ketones: Neg mg/dL  Specific Gravity: 1.020 Blood: Neg ery/uL  pH: <=5.0 Protein: Neg mg/dL  Glucose: 1+ mg/dL Urobilinogen: 0.2 mg/dL    Nitrites: Neg    Leukocyte Esterase: Neg leu/uL    ASSESSMENT:      ICD-10 Details  1 GU:   Prostate Cancer - C61    PLAN:           Schedule Return Visit/Planned Activity: Keep Scheduled Appointment - Schedule Surgery          Document Letter(s):  Created for Patient: Clinical Summary         Notes:  There are no changes in  the patients history or physical exam since last evaluation by Dr. Jeffie Pollock. Pt is scheduled to undergo brachytherapy and space oar placement on 02/09/21.   All pt's questions were answered to the best of my ability.          Next Appointment:      Next Appointment: 02/09/2021 07:30 AM    Appointment Type: Surgery     Location: [facility] [phone]    Provider: Irine Seal, M.D.    Reason for Visit: [place]

## 2021-02-08 NOTE — Telephone Encounter (Signed)
CALLED PATIENT TO REMIND OF PROCEDURE FOR 02-09-21, LVM FOR A RETURN CALL

## 2021-02-09 ENCOUNTER — Other Ambulatory Visit: Payer: Self-pay | Admitting: Internal Medicine

## 2021-02-09 ENCOUNTER — Ambulatory Visit (HOSPITAL_BASED_OUTPATIENT_CLINIC_OR_DEPARTMENT_OTHER): Payer: Medicare Other | Admitting: Anesthesiology

## 2021-02-09 ENCOUNTER — Encounter (HOSPITAL_BASED_OUTPATIENT_CLINIC_OR_DEPARTMENT_OTHER): Payer: Self-pay | Admitting: Urology

## 2021-02-09 ENCOUNTER — Encounter (HOSPITAL_BASED_OUTPATIENT_CLINIC_OR_DEPARTMENT_OTHER)
Admission: RE | Disposition: A | Discharge status: Home / Self Care | Payer: Self-pay | Source: Ambulatory Visit | Attending: Urology

## 2021-02-09 ENCOUNTER — Ambulatory Visit (HOSPITAL_COMMUNITY): Payer: Medicare Other

## 2021-02-09 ENCOUNTER — Ambulatory Visit (HOSPITAL_BASED_OUTPATIENT_CLINIC_OR_DEPARTMENT_OTHER)
Admission: RE | Admit: 2021-02-09 | Discharge: 2021-02-09 | Disposition: A | Discharge status: Home / Self Care | Payer: Medicare Other | Source: Ambulatory Visit | Attending: Urology | Admitting: Urology

## 2021-02-09 DIAGNOSIS — Z79899 Other long term (current) drug therapy: Secondary | ICD-10-CM | POA: Insufficient documentation

## 2021-02-09 DIAGNOSIS — E119 Type 2 diabetes mellitus without complications: Secondary | ICD-10-CM | POA: Diagnosis not present

## 2021-02-09 DIAGNOSIS — Z7982 Long term (current) use of aspirin: Secondary | ICD-10-CM | POA: Insufficient documentation

## 2021-02-09 DIAGNOSIS — E785 Hyperlipidemia, unspecified: Secondary | ICD-10-CM | POA: Diagnosis not present

## 2021-02-09 DIAGNOSIS — Z8551 Personal history of malignant neoplasm of bladder: Secondary | ICD-10-CM | POA: Insufficient documentation

## 2021-02-09 DIAGNOSIS — E1022 Type 1 diabetes mellitus with diabetic chronic kidney disease: Secondary | ICD-10-CM | POA: Diagnosis not present

## 2021-02-09 DIAGNOSIS — F17200 Nicotine dependence, unspecified, uncomplicated: Secondary | ICD-10-CM | POA: Insufficient documentation

## 2021-02-09 DIAGNOSIS — I10 Essential (primary) hypertension: Secondary | ICD-10-CM | POA: Diagnosis not present

## 2021-02-09 DIAGNOSIS — Z8042 Family history of malignant neoplasm of prostate: Secondary | ICD-10-CM | POA: Insufficient documentation

## 2021-02-09 DIAGNOSIS — C61 Malignant neoplasm of prostate: Secondary | ICD-10-CM | POA: Insufficient documentation

## 2021-02-09 DIAGNOSIS — Z794 Long term (current) use of insulin: Secondary | ICD-10-CM | POA: Insufficient documentation

## 2021-02-09 DIAGNOSIS — Z888 Allergy status to other drugs, medicaments and biological substances status: Secondary | ICD-10-CM | POA: Insufficient documentation

## 2021-02-09 DIAGNOSIS — Z91040 Latex allergy status: Secondary | ICD-10-CM | POA: Diagnosis not present

## 2021-02-09 DIAGNOSIS — I129 Hypertensive chronic kidney disease with stage 1 through stage 4 chronic kidney disease, or unspecified chronic kidney disease: Secondary | ICD-10-CM | POA: Diagnosis not present

## 2021-02-09 DIAGNOSIS — B354 Tinea corporis: Secondary | ICD-10-CM

## 2021-02-09 DIAGNOSIS — N182 Chronic kidney disease, stage 2 (mild): Secondary | ICD-10-CM | POA: Diagnosis not present

## 2021-02-09 HISTORY — PX: CYSTOSCOPY: SHX5120

## 2021-02-09 HISTORY — PX: SPACE OAR INSTILLATION: SHX6769

## 2021-02-09 HISTORY — DX: Chronic kidney disease, stage 2 (mild): N18.2

## 2021-02-09 HISTORY — PX: RADIOACTIVE SEED IMPLANT: SHX5150

## 2021-02-09 HISTORY — DX: Benign prostatic hyperplasia with lower urinary tract symptoms: N40.1

## 2021-02-09 HISTORY — DX: Presence of other specified devices: Z97.8

## 2021-02-09 LAB — GLUCOSE, CAPILLARY
Glucose-Capillary: 199 mg/dL — ABNORMAL HIGH (ref 70–99)
Glucose-Capillary: 230 mg/dL — ABNORMAL HIGH (ref 70–99)
Glucose-Capillary: 262 mg/dL — ABNORMAL HIGH (ref 70–99)

## 2021-02-09 SURGERY — INSERTION, RADIATION SOURCE, PROSTATE
Anesthesia: General | Site: Urethra

## 2021-02-09 MED ORDER — ACETAMINOPHEN 325 MG RE SUPP: 650.0000 mg | RECTAL | Status: DC | PRN

## 2021-02-09 MED ORDER — HYDROCODONE-ACETAMINOPHEN 5-325 MG PO TABS: 1.0000 | ORAL_TABLET | ORAL | 0 refills | Status: DC | PRN

## 2021-02-09 MED ORDER — INSULIN ASPART 100 UNIT/ML IJ SOLN: 4.0000 [IU] | Freq: Once | INTRAMUSCULAR | Status: DC

## 2021-02-09 MED ORDER — PROPOFOL 10 MG/ML IV BOLUS
INTRAVENOUS | Status: DC | PRN
  Administered 2021-02-09: 200 mg via INTRAVENOUS

## 2021-02-09 MED ORDER — ONDANSETRON HCL 4 MG/2ML IJ SOLN
INTRAMUSCULAR | Status: DC | PRN
  Administered 2021-02-09: 4 mg via INTRAVENOUS

## 2021-02-09 MED ORDER — CIPROFLOXACIN IN D5W 400 MG/200ML IV SOLN
INTRAVENOUS | Status: AC
  Filled 2021-02-09: qty 200

## 2021-02-09 MED ORDER — INSULIN ASPART 100 UNIT/ML IJ SOLN
6.0000 [IU] | Freq: Once | INTRAMUSCULAR | Status: AC
  Administered 2021-02-09: 6 [IU] via SUBCUTANEOUS

## 2021-02-09 MED ORDER — LACTATED RINGERS IV SOLN: INTRAVENOUS | Status: DC

## 2021-02-09 MED ORDER — ACETAMINOPHEN 10 MG/ML IV SOLN: 1000.0000 mg | Freq: Once | INTRAVENOUS | Status: DC | PRN

## 2021-02-09 MED ORDER — OXYCODONE HCL 5 MG PO TABS: 5.0000 mg | ORAL_TABLET | Freq: Once | ORAL | Status: DC | PRN

## 2021-02-09 MED ORDER — SODIUM CHLORIDE 0.9% FLUSH: 3.0000 mL | Freq: Two times a day (BID) | INTRAVENOUS | Status: DC

## 2021-02-09 MED ORDER — ACETAMINOPHEN 325 MG PO TABS: 650.0000 mg | ORAL_TABLET | ORAL | Status: DC | PRN

## 2021-02-09 MED ORDER — OXYCODONE HCL 5 MG PO TABS
ORAL_TABLET | ORAL | Status: AC
  Filled 2021-02-09: qty 1

## 2021-02-09 MED ORDER — ONDANSETRON HCL 4 MG/2ML IJ SOLN
INTRAMUSCULAR | Status: AC
  Filled 2021-02-09: qty 2

## 2021-02-09 MED ORDER — HYDROCODONE-ACETAMINOPHEN 5-325 MG PO TABS
1.0000 | ORAL_TABLET | Freq: Once | ORAL | Status: AC
  Administered 2021-02-09: 1 via ORAL

## 2021-02-09 MED ORDER — SODIUM CHLORIDE 0.9 % IV SOLN
INTRAVENOUS | Status: AC | PRN
  Administered 2021-02-09: 1000 mL

## 2021-02-09 MED ORDER — SODIUM CHLORIDE 0.9% FLUSH: 3.0000 mL | INTRAVENOUS | Status: DC | PRN

## 2021-02-09 MED ORDER — SUGAMMADEX SODIUM 200 MG/2ML IV SOLN
INTRAVENOUS | Status: DC | PRN
  Administered 2021-02-09: 200 mg via INTRAVENOUS

## 2021-02-09 MED ORDER — SODIUM CHLORIDE (PF) 0.9 % IJ SOLN
INTRAMUSCULAR | Status: DC | PRN
  Administered 2021-02-09: 10 mL

## 2021-02-09 MED ORDER — OXYCODONE HCL 5 MG/5ML PO SOLN: 5.0000 mg | Freq: Once | ORAL | Status: DC | PRN

## 2021-02-09 MED ORDER — PROPOFOL 10 MG/ML IV BOLUS
INTRAVENOUS | Status: AC
  Filled 2021-02-09: qty 40

## 2021-02-09 MED ORDER — FENTANYL CITRATE (PF) 100 MCG/2ML IJ SOLN: 25.0000 ug | INTRAMUSCULAR | Status: DC | PRN

## 2021-02-09 MED ORDER — SUCCINYLCHOLINE 20MG/ML (10ML) SYRINGE FOR MEDFUSION PUMP - OPTIME
INTRAMUSCULAR | Status: DC | PRN
  Administered 2021-02-09: 100 mg via INTRAVENOUS

## 2021-02-09 MED ORDER — ONDANSETRON 4 MG PO TBDP: 4.0000 mg | ORAL_TABLET | Freq: Three times a day (TID) | ORAL | 0 refills | Status: DC | PRN

## 2021-02-09 MED ORDER — INSULIN ASPART 100 UNIT/ML IJ SOLN
INTRAMUSCULAR | Status: AC
  Filled 2021-02-09: qty 1

## 2021-02-09 MED ORDER — PHENYLEPHRINE HCL (PRESSORS) 10 MG/ML IV SOLN
INTRAVENOUS | Status: DC | PRN
  Administered 2021-02-09: 120 ug via INTRAVENOUS
  Administered 2021-02-09: 80 ug via INTRAVENOUS
  Administered 2021-02-09: 120 ug via INTRAVENOUS

## 2021-02-09 MED ORDER — ROCURONIUM 10MG/ML (10ML) SYRINGE FOR MEDFUSION PUMP - OPTIME
INTRAVENOUS | Status: DC | PRN
  Administered 2021-02-09: 30 mg via INTRAVENOUS

## 2021-02-09 MED ORDER — LIDOCAINE HCL (CARDIAC) PF 100 MG/5ML IV SOSY
PREFILLED_SYRINGE | INTRAVENOUS | Status: DC | PRN
  Administered 2021-02-09: 100 mg via INTRAVENOUS

## 2021-02-09 MED ORDER — LIDOCAINE HCL (PF) 2 % IJ SOLN
INTRAMUSCULAR | Status: AC
  Filled 2021-02-09: qty 5

## 2021-02-09 MED ORDER — AMISULPRIDE (ANTIEMETIC) 5 MG/2ML IV SOLN: 10.0000 mg | Freq: Once | INTRAVENOUS | Status: DC | PRN

## 2021-02-09 MED ORDER — IOHEXOL 300 MG/ML  SOLN
INTRAMUSCULAR | Status: DC | PRN
  Administered 2021-02-09: 3 mL

## 2021-02-09 MED ORDER — HYDROCODONE-ACETAMINOPHEN 5-325 MG PO TABS
ORAL_TABLET | ORAL | Status: AC
  Filled 2021-02-09: qty 1

## 2021-02-09 MED ORDER — DEXAMETHASONE SODIUM PHOSPHATE 10 MG/ML IJ SOLN
INTRAMUSCULAR | Status: AC
  Filled 2021-02-09: qty 1

## 2021-02-09 MED ORDER — FLEET ENEMA 7-19 GM/118ML RE ENEM
1.0000 | ENEMA | Freq: Once | RECTAL | Status: DC
Start: 2021-02-10 — End: 2021-02-09

## 2021-02-09 MED ORDER — FENTANYL CITRATE (PF) 100 MCG/2ML IJ SOLN
INTRAMUSCULAR | Status: AC
  Filled 2021-02-09: qty 2

## 2021-02-09 MED ORDER — CIPROFLOXACIN IN D5W 400 MG/200ML IV SOLN
400.0000 mg | INTRAVENOUS | Status: AC
  Administered 2021-02-09: 400 mg via INTRAVENOUS

## 2021-02-09 MED ORDER — FENTANYL CITRATE (PF) 100 MCG/2ML IJ SOLN
INTRAMUSCULAR | Status: DC | PRN
  Administered 2021-02-09: 100 ug via INTRAVENOUS

## 2021-02-09 MED ORDER — ONDANSETRON HCL 4 MG/2ML IJ SOLN: 4.0000 mg | Freq: Once | INTRAMUSCULAR | Status: DC | PRN

## 2021-02-09 MED ORDER — DEXAMETHASONE SODIUM PHOSPHATE 10 MG/ML IJ SOLN
INTRAMUSCULAR | Status: DC | PRN
  Administered 2021-02-09: 10 mg via INTRAVENOUS

## 2021-02-09 MED ORDER — LACTATED RINGERS IV SOLN: INTRAVENOUS | Status: DC | PRN

## 2021-02-09 MED ORDER — SODIUM CHLORIDE 0.9 % IV SOLN: 250.0000 mL | INTRAVENOUS | Status: DC | PRN

## 2021-02-09 MED ORDER — ROCURONIUM BROMIDE 10 MG/ML (PF) SYRINGE
PREFILLED_SYRINGE | INTRAVENOUS | Status: AC
  Filled 2021-02-09: qty 10

## 2021-02-09 SURGICAL SUPPLY — 36 items
BAG DRN RND TRDRP ANRFLXCHMBR (UROLOGICAL SUPPLIES) ×3
BAG URINE DRAIN 2000ML AR STRL (UROLOGICAL SUPPLIES) ×4 IMPLANT
BLADE CLIPPER SENSICLIP SURGIC (BLADE) ×4 IMPLANT
CATH FOLEY 2WAY  5CC 20FR SIL (CATHETERS) ×4
CATH FOLEY 2WAY 5CC 20FR SIL (CATHETERS) ×3 IMPLANT
CATH SILICONE 16FRX5CC (CATHETERS) ×4 IMPLANT
CLOTH BEACON ORANGE TIMEOUT ST (SAFETY) ×4 IMPLANT
CNTNR URN SCR LID CUP LEK RST (MISCELLANEOUS) ×9 IMPLANT
CONT SPEC 4OZ STRL OR WHT (MISCELLANEOUS) ×12
COVER BACK TABLE 60X90IN (DRAPES) ×4 IMPLANT
COVER MAYO STAND STRL (DRAPES) ×4 IMPLANT
COVER PROBE W GEL 5X96 (DRAPES) ×4 IMPLANT
DRAPE C-ARM 35X43 STRL (DRAPES) ×4 IMPLANT
DRSG TEGADERM 4X4.75 (GAUZE/BANDAGES/DRESSINGS) ×4 IMPLANT
DRSG TEGADERM 8X12 (GAUZE/BANDAGES/DRESSINGS) ×4 IMPLANT
GAUZE SPONGE 4X4 12PLY STRL (GAUZE/BANDAGES/DRESSINGS) ×4 IMPLANT
GLOVE SURG POLYISO LF SZ7.5 (GLOVE) ×4 IMPLANT
GLOVE SURG POLYISO LF SZ8 (GLOVE) ×12 IMPLANT
GLOVE SURG POLYISO LF SZ8.5 (GLOVE) ×16 IMPLANT
GLOVE SURG UNDER POLY LF SZ6.5 (GLOVE) ×4 IMPLANT
GLOVE SURG UNDER POLY LF SZ7 (GLOVE) ×4 IMPLANT
GOWN STRL REUS W/TWL LRG LVL3 (GOWN DISPOSABLE) ×4 IMPLANT
GOWN STRL REUS W/TWL XL LVL3 (GOWN DISPOSABLE) ×12 IMPLANT
I-Seed AgX100 ×240 IMPLANT
IMPL SPACEOAR SYSTEM 10ML (Spacer) ×3 IMPLANT
IMPLANT SPACEOAR SYSTEM 10ML (Spacer) ×4 IMPLANT
IV NS 1000ML (IV SOLUTION) ×4
IV NS 1000ML BAXH (IV SOLUTION) ×3 IMPLANT
KIT TURNOVER CYSTO (KITS) ×4 IMPLANT
MARKER SKIN DUAL TIP RULER LAB (MISCELLANEOUS) ×4 IMPLANT
PACK CYSTO (CUSTOM PROCEDURE TRAY) ×4 IMPLANT
SURGILUBE 2OZ TUBE FLIPTOP (MISCELLANEOUS) ×4 IMPLANT
SYR 10ML LL (SYRINGE) ×8 IMPLANT
TOWEL OR 17X26 10 PK STRL BLUE (TOWEL DISPOSABLE) ×4 IMPLANT
UNDERPAD 30X36 HEAVY ABSORB (UNDERPADS AND DIAPERS) ×8 IMPLANT
WATER STERILE IRR 500ML POUR (IV SOLUTION) ×4 IMPLANT

## 2021-02-09 NOTE — Anesthesia Postprocedure Evaluation (Signed)
Anesthesia Post Note  Patient: Anthony Daniel.  Procedure(s) Performed: RADIOACTIVE SEED IMPLANT/BRACHYTHERAPY IMPLANT (Prostate) SPACE OAR INSTILLATION CYSTOSCOPY FLEXIBLE (Urethra)     Patient location during evaluation: PACU Anesthesia Type: General Level of consciousness: awake and alert Pain management: pain level controlled Vital Signs Assessment: post-procedure vital signs reviewed and stable Respiratory status: spontaneous breathing, nonlabored ventilation, respiratory function stable and patient connected to nasal cannula oxygen Cardiovascular status: blood pressure returned to baseline and stable Postop Assessment: no apparent nausea or vomiting Anesthetic complications: no   No notable events documented.  Last Vitals:  Vitals:   02/09/21 0930 02/09/21 1030  BP: (!) 175/86 133/63  Pulse: 68 67  Resp: (!) 23 16  Temp: 36.4 C 36.4 C  SpO2: 94% 100%    Last Pain:  Vitals:   02/09/21 1030  TempSrc: Oral  PainSc: 6                  Barnet Glasgow

## 2021-02-09 NOTE — Anesthesia Procedure Notes (Addendum)
Procedure Name: LMA Insertion Date/Time: 02/09/2021 7:58 AM Performed by: Verita Lamb, CRNA Pre-anesthesia Checklist: Patient identified, Emergency Drugs available, Suction available and Patient being monitored Patient Re-evaluated:Patient Re-evaluated prior to induction Oxygen Delivery Method: Circle system utilized Preoxygenation: Pre-oxygenation with 100% oxygen Induction Type: IV induction Ventilation: Mask ventilation without difficulty LMA: LMA inserted LMA Size: 5.0 Tube type: Oral Number of attempts: 1 Airway Equipment and Method: Bite block Placement Confirmation: positive ETCO2 Tube secured with: Tape Dental Injury: Teeth and Oropharynx as per pre-operative assessment

## 2021-02-09 NOTE — Discharge Instructions (Signed)
PROSTATE CANCER TREATMENT WITH RADIOACTIVE IODINE-125 SEED IMPLANT  This instruction sheet is intended to discuss implantation of Iodine-125 seeds as treatment for cancer of the prostate. It will explain in detail what you may expect from this treatment and what precautions are necessary as a result of the treatment. Iodine-125 emits a relatively low energy radiation. The radioactive seeds are surgically implanted directly into the prostate gland. Most of the radiation is contained within the prostate gland. A very small amount is present outside the body.The precautions that we ask you to take are to ensure that those around you are protected from unnecessary radiation. The principles of radiation safety that you need to understand are:  DISTANCE: The further a person is from the radioactive implant the less radiation they will be receiving. The amount of radiation received falls off quite rapidly with distance. More specific guidelines are given in the table on the last page.  TIME: The amount of radiation a person is exposed to is directly proportional to the amount of time that is spent in close proximity to the radioactive implant. Very little radiation will be received during short periods. See the table on the last page for more specific guideline.  CHILDREN UNDER AGE 26 Children should not be allowed to sit on your lap or otherwise be in very close contact for more than a few minutes for the first 6-8 weeks following the implant. You may affectionately greet (hug/kiss) a child for a short period of time, but remember, the longer you are in close proximity with that child the more radiation they are being exposed to. At a distance of 6 feet there is no limit to the length of time you may spend together. See specific guidelines on the last page.  PREGNANT OR POSSIBLY PREGNANT WOMEN Pregnant women should avoid prolonged close physical contact with you for the first 6-8 weeks after implant. At a  distance of 6 feet there is no limit to the length of time you may spend together. Pregnant women or possibly pregnant women can safely be in close contact with you for a limited period of time. See the last page for guidelines.  FAMILY RELATIONS You may sleep in the same bed as your partner (provided she is not pregnant or under the age of 60). Sexual intercourse, using a condom, may be resumed 2 weeks after the implant. Your semen may be discolored, dark brown or black. This is normal and is the result of bleeding that may have occurred during the implant. After 3-4 weeks it will not be necessary to use a condom.  DAILY ACTIVITIES You may resume normal activities in a few days (example: work, shopping, church) without the risk of harmful radiation exposure to those around you provided you keep in mind the time and distance precautions. Objects that you touch or item that you use do not become radioactive. Linens, clothing, tableware, and dishes may be used by other persons without special precautions. Your bodily wastes (urine and stool) are not radioactive.  SPECIAL PRECAUTIONS It is possible to lose implanted Iodine-125 seed(s) through urination. Although it is possible to pass seeds indefinitely, it is most likely to occur immediately after catheter removal. To prevent this from happening the catheter that was in place during the implant procedure is removed immediately after the implant and a cystoscopy procedure is performed. The process of removing the catheter and the cystoscopy procedure should dislodge and remove any seeds that are not firmly imbedded in the prostate  tissue. However, you should watch for seeds if/when you remove your catheter at home. The seeds are silver colored and the size of a grain of rice. In the unlikely event that a seed is seen after urination, simply flush the seed down the toilet. The seed should not be handled with your fingers, not even with a glove or napkin. A  spoon or tweezers can be used to pick up a seed. The Radiation Oncology department is open Monday - Friday from 8:00 am to 5:30 pm with a Radiation Oncologist on call at all times. He or she may be reached by calling [phone]. If you are to be hospitalized or if death should occur, your family should notify the [facility], broadcasting/film/video.  SIDE EFFECTS There are very few side effects associate with the implant procedure. Minor burning with urination, weak stream, hesitancy, intermittency, frequency, mild pain or feeling unable to pass your urine freely are common and usually stop in one to four months. If these symptoms are extremely uncomfortable, contact your physician.  RADIATION SAFETY GUIDELINES PROSTATE CANCER TREATMENT WITH RADIOACTIVE IODINE-125 SEED IMPLANT  The following guidelines will limit exposure to less than naturally occurring background radiation.  PERSONS AGE 4-45 (if able to become pregnant)  FOR 8 WEEKS FOLLOWING IMPLANT  At a distance of 1 foot: limit time to less than 2 hours/week At a distance of 3 feet: limit time to 20 hours/week At a distance of 6 feet: no restrictions  AFTER 8 WEEKS No restrictions  CHILDREN UNDER AGE 4, PREGNANT WOMEN OR POSSIBLY PREGNANT WOMEN  FOR 8 WEEKS FOLLOWING IMPLANT At a distance of 1 foot: limit time to 10 minutes/week At a distance of 3 feet: limit time to 2 hours/week At a distance of 6 feet: no restrictions  AFTER 8 WEEKS No restrictions  PERSONS OVER THE AGE OF 45 AND DO NOT EXPECT TO HAVE ANY MORE CHILDREN No restrictions  Updated by [facility] in January 2020  Post Anesthesia Home Care Instructions  Activity: Get plenty of rest for the remainder of the day. A responsible individual must stay with you for 24 hours following the procedure.  For the next 24 hours, DO NOT: -Drive a car -Paediatric nurse -Drink alcoholic beverages -Take any medication unless instructed by your physician -Make any legal decisions  or sign important papers.  Meals: Start with liquid foods such as gelatin or soup. Progress to regular foods as tolerated. Avoid greasy, spicy, heavy foods. If nausea and/or vomiting occur, drink only clear liquids until the nausea and/or vomiting subsides. Call your physician if vomiting continues.  Special Instructions/Symptoms: Your throat may feel dry or sore from the anesthesia or the breathing tube placed in your throat during surgery. If this causes discomfort, gargle with warm salt water. The discomfort should disappear within 24 hours.

## 2021-02-09 NOTE — Transfer of Care (Signed)
Immediate Anesthesia Transfer of Care Note  Patient: Anthony Daniel.  Procedure(s) Performed: RADIOACTIVE SEED IMPLANT/BRACHYTHERAPY IMPLANT (Prostate) SPACE OAR INSTILLATION CYSTOSCOPY FLEXIBLE (Urethra)  Patient Location: PACU  Anesthesia Type:General  Level of Consciousness: awake and alert   Airway & Oxygen Therapy: Patient Spontanous Breathing and Patient connected to face mask oxygen  Post-op Assessment: Report given to RN and Post -op Vital signs reviewed and stable  Post vital signs: Reviewed and stable  Last Vitals:  Vitals Value Taken Time  BP 147/77 02/09/21 0900  Temp 36.4 C 02/09/21 0858  Pulse 77 02/09/21 0902  Resp 19 02/09/21 0902  SpO2 97 % 02/09/21 0902  Vitals shown include unvalidated device data.  Last Pain:  Vitals:   02/09/21 0610  TempSrc: Oral  PainSc: 0-No pain      Patients Stated Pain Goal: 5 ([id number])  Complications: No notable events documented.

## 2021-02-09 NOTE — Anesthesia Procedure Notes (Addendum)
Procedure Name: Intubation Date/Time: 02/09/2021 7:59 AM Performed by: Verita Lamb, CRNA Pre-anesthesia Checklist: Patient identified, Emergency Drugs available, Suction available and Patient being monitored Patient Re-evaluated:Patient Re-evaluated prior to induction Oxygen Delivery Method: Circle system utilized Preoxygenation: Pre-oxygenation with 100% oxygen Induction Type: IV induction Ventilation: Mask ventilation without difficulty Laryngoscope Size: Glidescope and 4 Grade View: Grade I Tube type: Oral Tube size: 7.0 mm Number of attempts: 1 Airway Equipment and Method: Stylet and Oral airway Placement Confirmation: ETT inserted through vocal cords under direct vision, positive ETCO2, breath sounds checked- equal and bilateral and CO2 detector Secured at: 22 cm Tube secured with: Tape Dental Injury: Teeth and Oropharynx as per pre-operative assessment  Difficulty Due To: Difficulty was unanticipated Comments: oted clear liquids coming up into LMA, significant amount, anesthesiologist stat paged (sevo at 3 percent), LMA removed, pt suctioned, succ given, intubated with glidescope.  ETT suctioned noted small amount of clear secretions. Sats maintained throughout, head and neck maintained in neutral positioin during intubation

## 2021-02-09 NOTE — Interval H&P Note (Signed)
History and Physical Interval Note:  02/09/2021 6:59 AM  Anthony Daniel.  has presented today for surgery, with the diagnosis of PROSTATE CANCER.  The various methods of treatment have been discussed with the patient and family. After consideration of risks, benefits and other options for treatment, the patient has consented to  Procedure(s): RADIOACTIVE SEED IMPLANT/BRACHYTHERAPY IMPLANT (N/A) SPACE OAR INSTILLATION (N/A) as a surgical intervention.  The patient's history has been reviewed, patient examined, no change in status, stable for surgery.  I have reviewed the patient's chart and labs.  Questions were answered to the patient's satisfaction.     Irine Seal

## 2021-02-09 NOTE — Op Note (Signed)
PATIENT:  Anthony Daniel.  PRE-OPERATIVE DIAGNOSIS:  Adenocarcinoma of the prostate  POST-OPERATIVE DIAGNOSIS:  Same  PROCEDURE:  Procedure(s): 1. I-125 radioactive seed implantation 2. SpaceOAR implantation. 3.  Cystoscopy  SURGEON:  Surgeon(s): Irine Seal MD  Radiation oncologist: Dr. Tyler Pita  ANESTHESIA:  General  EBL:  Minimal  DRAINS: 25 French Foley catheter  INDICATION: Anthony Daniel. is a 70 y.o. with Stage T1c, Gleason 7(4+3) prostate cancer who has elected brachytherapy for treatment.  Description of procedure: After informed consent the patient was brought to the major OR, placed on the table and administered general anesthesia. He was then moved to the modified lithotomy position with his perineum perpendicular to the floor. His perineum and genitalia were then sterilely prepped. An official timeout was then performed. A 16 French Foley catheter was then placed in the bladder and filled with dilute contrast, a rectal tube was placed in the rectum and the transrectal ultrasound probe was placed in the rectum and affixed to the stand. He was then sterilely draped.  The sterile grid was installed.   Anchor needles were then placed.   Real time ultrasonography was used along with the seed planning software spot-pro version 3.1-00. This was used to develop the seed plan including the number of needles as well as number of seeds required for complete and adequate coverage. Real-time ultrasonography was then used along with the previously developed plan  to implant a total of 60 seeds using 19 needles for a target dose of 145 Gy. This proceeded without difficulty or complication.  The anchor needles and guide were removed and the SpaceOAR needle was passed under US guidance into the fat stripe posterior to the prostate with the tip in the midline at mid prostate. A puff of NS confirmed appropriate positioning and the SpaceOAR view polymer was then injected over 10  seconds into the space with excellent distribution.     A Foley catheter was then removed as well as the transrectal ultrasound probe and rectal probe. Flexible cystoscopy was then performed using the 17 French flexible scope which revealed a normal urethra throughout its length down to the sphincter which appeared intact. The prostatic urethra was 3cm with bilobar hyperplasia with minimal obstruction. The bladder was then entered and fully and systematically inspected.  The ureteral orifices were noted to be of normal configuration and position. The mucosa revealed no evidence of tumors. There were a few small uric acid appearing stones/grit identified within the bladder.  No seeds or spacers were seen and/or removed from the bladder.  The cystoscope was then removed.  The drapes were removed.  The perineum was cleaned and dressed.  He was taken out of the lithotomy position and was awakened and taken to recovery room in stable and satisfactory condition. He tolerated procedure well and there were no intraoperative complications.

## 2021-02-10 ENCOUNTER — Encounter (HOSPITAL_BASED_OUTPATIENT_CLINIC_OR_DEPARTMENT_OTHER): Payer: Self-pay | Admitting: Urology

## 2021-02-10 NOTE — Progress Notes (Signed)
  Radiation Oncology         [phone]) [phone] ________________________________  Name: [name] MRN: [id number]  Date: 02/10/2021  DOB: April 08, 1951       Prostate Seed Implant  [id number], Arvid Right, MD  No ref. provider found  DIAGNOSIS:  70 y.o. gentleman with stage T1c adenocarcinoma of the prostate with a Gleason's score of 4+3 and a PSA of 7.64  Oncology History  Prostate cancer ([place])  09/06/2018 Initial Diagnosis   Prostate cancer ([place])    09/23/2020 Cancer Staging   Staging form: Prostate, AJCC 8th Edition - Clinical stage from 09/23/2020: Stage IIC (cT1c, cN0, cM0, PSA: 6, Grade Group: 3) - Signed by Freeman Caldron, PA-C on 10/25/2020  Histopathologic type: Adenocarcinoma, NOS  Stage prefix: Initial diagnosis  Prostate specific antigen (PSA) range: Less than 10  Gleason primary pattern: 4  Gleason secondary pattern: 3  Gleason score: 7  Histologic grading system: 5 grade system  Number of biopsy cores examined: 12  Number of biopsy cores positive: 4  Location of positive needle core biopsies: Both sides      No diagnosis found.  PROCEDURE: Insertion of radioactive I-125 seeds into the prostate gland.  RADIATION DOSE: 145 Gy, definitive therapy.  TECHNIQUE: Manit Pugliano. was brought to the operating room with the urologist. He was placed in the dorsolithotomy position. He was catheterized and a rectal tube was inserted. The perineum was shaved, prepped and draped. The ultrasound probe was then introduced into the rectum to see the prostate gland.  TREATMENT DEVICE: A needle grid was attached to the ultrasound probe stand and anchor needles were placed.  3D PLANNING: The prostate was imaged in 3D using a sagittal sweep of the prostate probe. These images were transferred to the planning computer. There, the prostate, urethra and rectum were defined on each axial reconstructed image. Then, the software created an optimized 3D plan and a few seed positions  were adjusted. The quality of the plan was reviewed using [facility] information for the target and the following two organs at risk:  Urethra and Rectum.  Then the accepted plan was printed and handed off to the radiation therapist.  Under my supervision, the custom loading of the seeds and spacers was carried out and loaded into sealed vicryl sleeves.  These pre-loaded needles were then placed into the needle holder.Marland Kitchen  PROSTATE VOLUME STUDY:  Using transrectal ultrasound the volume of the prostate was verified to be 39.1 cc.  SPECIAL TREATMENT PROCEDURE/SUPERVISION AND HANDLING: The pre-loaded needles were then delivered under sagittal guidance. A total of 19 needles were used to deposit 60 seeds in the prostate gland. The individual seed activity was 0.501 mCi.  SpaceOAR:  Yes  COMPLEX SIMULATION: At the end of the procedure, an anterior radiograph of the pelvis was obtained to document seed positioning and count. Cystoscopy was performed to check the urethra and bladder.  MICRODOSIMETRY: At the end of the procedure, the patient was emitting 0.043 mR/hr at 1 meter. Accordingly, he was considered safe for hospital discharge.  PLAN: The patient will return to the radiation oncology clinic for post implant CT dosimetry in three weeks.   ________________________________  Sheral Apley Tammi Klippel, M.D.

## 2021-02-15 DIAGNOSIS — N499 Inflammatory disorder of unspecified male genital organ: Secondary | ICD-10-CM | POA: Diagnosis not present

## 2021-02-22 ENCOUNTER — Telehealth: Payer: Self-pay | Admitting: *Deleted

## 2021-02-22 NOTE — Telephone Encounter (Signed)
Called patient to remind of post seed appts. for 02-23-21, spoke with patient and he is aware of these appts.

## 2021-02-23 ENCOUNTER — Other Ambulatory Visit: Payer: Self-pay

## 2021-02-23 ENCOUNTER — Ambulatory Visit
Admission: RE | Admit: 2021-02-23 | Discharge: 2021-02-23 | Disposition: A | Discharge status: Home / Self Care | Payer: Medicare Other | Source: Ambulatory Visit | Attending: Urology | Admitting: Urology

## 2021-02-23 ENCOUNTER — Ambulatory Visit
Admission: RE | Admit: 2021-02-23 | Discharge: 2021-02-23 | Disposition: A | Discharge status: Home / Self Care | Payer: Medicare Other | Source: Ambulatory Visit | Attending: Radiation Oncology | Admitting: Radiation Oncology

## 2021-02-23 DIAGNOSIS — Z923 Personal history of irradiation: Secondary | ICD-10-CM | POA: Insufficient documentation

## 2021-02-23 DIAGNOSIS — Z7982 Long term (current) use of aspirin: Secondary | ICD-10-CM | POA: Insufficient documentation

## 2021-02-23 DIAGNOSIS — Z510 Encounter for antineoplastic radiation therapy: Secondary | ICD-10-CM | POA: Insufficient documentation

## 2021-02-23 DIAGNOSIS — C61 Malignant neoplasm of prostate: Secondary | ICD-10-CM

## 2021-02-23 DIAGNOSIS — Z794 Long term (current) use of insulin: Secondary | ICD-10-CM | POA: Insufficient documentation

## 2021-02-23 DIAGNOSIS — Z79899 Other long term (current) drug therapy: Secondary | ICD-10-CM | POA: Insufficient documentation

## 2021-02-23 NOTE — Progress Notes (Signed)
Patient reports incomplete emptying of bladder, frequency and urgency, with a weak urine stream and nocturia most of the time. Occasional intermittency and straining. Follow up with urology 03/02/21.  I-PSS score 21  BP 111/61 (BP Location: Left Arm, Patient Position: Sitting)   Pulse 71   Temp 98.3 F (36.8 C) (Oral)   Resp 18   Ht '6\' 1"'$  (1.854 m)   Wt 247 lb (112 kg)   SpO2 100%   BMI 32.59 kg/m

## 2021-02-23 NOTE — Progress Notes (Signed)
Radiation Oncology         [phone]) [phone] ________________________________  Name: Anthony Daniel. MRN: [id number]  Date: 02/23/2021  DOB: 1950/08/19  Post-Seed Follow-Up Visit Note  CC: Anthony Lima, MD  Anthony Seal, MD  Diagnosis:   70 y.o. gentleman with stage T1c adenocarcinoma of the prostate with a Gleason's score of 4+3 and a PSA of 7.64    ICD-10-CM   1. Prostate cancer ([place])  C61       Interval Since Last Radiation:  2 weeks 02/09/21:  Insertion of radioactive I-125 seeds into the prostate gland; 145 Gy, definitive therapy with placement of SpaceOAR gel.  Narrative:  The patient returns today for routine follow-up.  He is complaining of increased urinary frequency and urinary hesitation symptoms. He filled out a questionnaire regarding urinary function today providing and overall IPSS score of 21 characterizing his symptoms as severe with nocturia x3, weak stream, frequency and urgency. He has some mild dysuria/irritation at the start of his stream but denies gross hematuria, straining to void or incontinence.  He is taking Flomax as prescribed. His pre-implant score was 8. He denies any abdominal pain or bowel symptoms. Unfortunately, he did develop a perineal abscess following his procedure. This drained spontaneously and he completed antibiotics as prescribed. He denies recent fever, chills, night sweats, nausea or vomiting.  ALLERGIES:  is allergic to altace [ramipril], crestor [rosuvastatin], lipitor [atorvastatin], contrast media [iodinated diagnostic agents], dilaudid [hydromorphone], iodine, latex, oxycodone, adhesive [tape], and peanut-containing drug products.  Meds: Current Outpatient Medications  Medication Sig Dispense Refill   aspirin EC 81 MG tablet Take 81 mg by mouth in the morning.     candesartan (ATACAND) 16 MG tablet TAKE 1 TABLET BY MOUTH EVERY DAY (Patient taking differently: Take 16 mg by mouth in the morning.) 90 tablet 1   Ciclopirox 0.77 % gel APPLY  TO AFFECTED AREA TWICE A DAY 30 g 2   Continuous Blood Gluc Receiver (DEXCOM G6 RECEIVER) DEVI See admin instructions.     Continuous Blood Gluc Sensor (DEXCOM G6 SENSOR) MISC change sensor     Continuous Blood Gluc Transmit (DEXCOM G6 TRANSMITTER) MISC See admin instructions.     ezetimibe (ZETIA) 10 MG tablet TAKE 1 TABLET BY MOUTH EVERY DAY (Patient taking differently: Take 10 mg by mouth daily.) 90 tablet 1   Glucagon (GVOKE HYPOPEN 2-PACK) 1 MG/0.2ML SOAJ Inject 1 Act into the skin daily as needed. (Patient taking differently: Inject 1 Act into the skin daily as needed (hypoglycemia).) 2 mL 5   glucose blood (ONETOUCH VERIO) test strip 5X A DAY     indapamide (LOZOL) 1.25 MG tablet TAKE 1 TABLET BY MOUTH EVERY DAY 90 tablet 0   insulin aspart (NOVOLOG) 100 UNIT/ML injection Inject into the skin as directed. USE VIA INSULIN PUMP TO ADMINISTER UP TO 130 UNITS MAXIUM DAILY     Insulin Glargine (LANTUS SOLOSTAR [place]) Inject into the skin 2 (two) times daily. 25 UNITS IN AM AND  20--25 UNITS;  WHEN FASTING OR UNABLE TO EAT LOWER TO 18 UNITS TWICE DAILY     Insulin Human (INSULIN PUMP) SOLN Inject 1 each into the skin continuous. Novolog insulin     levocetirizine (XYZAL) 5 MG tablet TAKE 1 TABLET BY MOUTH DAILY AS NEEDED FOR ALLERGIES. (Patient taking differently: Take 5 mg by mouth daily as needed.) 90 tablet 1   metaxalone (SKELAXIN) 400 MG tablet Take 400 mg by mouth in the morning.  methocarbamol (ROBAXIN) 500 MG tablet Take 1 tablet (500 mg total) by mouth every 6 (six) hours as needed for muscle spasms. 90 tablet 0   NOVOLOG FLEXPEN 100 UNIT/ML FlexPen Inject 12-15 Units into the skin 3 (three) times daily before meals. WHEN NOT USING INSULIN PUMP ;  WHEN FASTING OR UNABLE TO EAT ONLY WHEN NEEDED TO TREAT BLOOD SUGAR >200     Polyethyl Glycol-Propyl Glycol (SYSTANE OP) Place 1 drop into both eyes 3 (three) times daily as needed (dry/irritated eyes).     Potassium Citrate 15 MEQ (1620 MG) TBCR  Take 1 tablet by mouth in the morning and at bedtime.     SIMPLY SALINE NA Place 1-2 sprays into the nose 3 (three) times daily as needed (congestion/allergies).     tamsulosin (FLOMAX) 0.4 MG CAPS capsule Take 0.4 mg by mouth in the morning.     HYDROcodone-acetaminophen (NORCO/VICODIN) 5-325 MG tablet Take 1 tablet by mouth every 4 (four) hours as needed for moderate pain ((score 4 to 6)). (Patient not taking: Reported on 02/23/2021) 10 tablet 0   ondansetron (ZOFRAN ODT) 4 MG disintegrating tablet Take 1 tablet (4 mg total) by mouth every 8 (eight) hours as needed for nausea or vomiting. (Patient not taking: Reported on 02/23/2021) 12 tablet 0   No current facility-administered medications for this encounter.    Physical Findings: In general this is a well appearing African American male in no acute distress. He's alert and oriented x4 and appropriate throughout the examination. Cardiopulmonary assessment is negative for acute distress and he exhibits normal effort.   Lab Findings: Lab Results  Component Value Date   WBC 9.0 02/05/2021   HGB 13.9 02/05/2021   HCT 42.4 02/05/2021   MCV 91.0 02/05/2021   PLT 187 02/05/2021    Radiographic Findings:  Patient underwent CT imaging in our clinic for post implant dosimetry. The CT will be reviewed by Dr. Tammi Klippel to confirm there is an adequate distribution of radioactive seeds throughout the prostate gland and ensure that there are no seeds in or near the rectum.  We suspect the final radiation plan and dosimetry will show appropriate coverage of the prostate gland. He understands that we will call and inform him of any unexpected findings on further review of his imaging and dosimetry.  Impression/Plan: 70 y.o. gentleman with stage T1c adenocarcinoma of the prostate with a Gleason's score of 4+3 and a PSA of 7.64 The patient is recovering from the effects of radiation. His urinary symptoms should gradually improve over the next 4-6 months. We  talked about this today. He is encouraged by his improvement already and is otherwise pleased with his outcome. We also talked about long-term follow-up for prostate cancer following seed implant. He understands that ongoing PSA determinations and digital rectal exams will help perform surveillance to rule out disease recurrence. He has a follow up appointment scheduled with Jiles Crocker, NP on 03/02/21 and anticipates a visit with Dr. Jeffie Pollock in October 2022 for his first post-procedure PSA. He understands what to expect with his PSA measures. Patient was also educated today about some of the long-term effects from radiation including a small risk for rectal bleeding and possibly erectile dysfunction. We talked about some of the general management approaches to these potential complications. However, I did encourage the patient to contact our office or return at any point if he has questions or concerns related to his previous radiation and prostate cancer.    Nicholos Johns, PA-C

## 2021-02-23 NOTE — Progress Notes (Signed)
  Radiation Oncology         [phone]) [phone] ________________________________  Name: Anthony Daniel. MRN: [id number]  Date: 02/23/2021  DOB: Feb 23, 1951  COMPLEX SIMULATION NOTE  NARRATIVE:  The patient was brought to the [facility] today following prostate seed implantation approximately one month ago.  Identity was confirmed.  All relevant records and images related to the planned course of therapy were reviewed.  Then, the patient was set-up supine.  CT images were obtained.  The CT images were loaded into the planning software.  Then the prostate and rectum were contoured.  Treatment planning then occurred.  The implanted iodine 125 seeds were identified by the physics staff for projection of radiation distribution  I have requested : 3D Simulation  I have requested a DVH of the following structures: Prostate and rectum.    ________________________________  Sheral Apley Tammi Klippel, M.D.

## 2021-03-01 DIAGNOSIS — Z720 Tobacco use: Secondary | ICD-10-CM | POA: Diagnosis not present

## 2021-03-01 DIAGNOSIS — N182 Chronic kidney disease, stage 2 (mild): Secondary | ICD-10-CM | POA: Diagnosis not present

## 2021-03-01 DIAGNOSIS — E785 Hyperlipidemia, unspecified: Secondary | ICD-10-CM | POA: Diagnosis not present

## 2021-03-01 DIAGNOSIS — R7309 Other abnormal glucose: Secondary | ICD-10-CM | POA: Diagnosis not present

## 2021-03-01 DIAGNOSIS — E1022 Type 1 diabetes mellitus with diabetic chronic kidney disease: Secondary | ICD-10-CM | POA: Diagnosis not present

## 2021-03-01 DIAGNOSIS — Z794 Long term (current) use of insulin: Secondary | ICD-10-CM | POA: Diagnosis not present

## 2021-03-02 DIAGNOSIS — R8279 Other abnormal findings on microbiological examination of urine: Secondary | ICD-10-CM | POA: Diagnosis not present

## 2021-03-02 DIAGNOSIS — C61 Malignant neoplasm of prostate: Secondary | ICD-10-CM | POA: Diagnosis not present

## 2021-03-03 DIAGNOSIS — M5412 Radiculopathy, cervical region: Secondary | ICD-10-CM | POA: Diagnosis not present

## 2021-03-03 DIAGNOSIS — G959 Disease of spinal cord, unspecified: Secondary | ICD-10-CM | POA: Diagnosis not present

## 2021-03-03 DIAGNOSIS — M542 Cervicalgia: Secondary | ICD-10-CM | POA: Diagnosis not present

## 2021-03-03 DIAGNOSIS — M5030 Other cervical disc degeneration, unspecified cervical region: Secondary | ICD-10-CM | POA: Diagnosis not present

## 2021-03-03 DIAGNOSIS — M4722 Other spondylosis with radiculopathy, cervical region: Secondary | ICD-10-CM | POA: Diagnosis not present

## 2021-03-03 DIAGNOSIS — M4802 Spinal stenosis, cervical region: Secondary | ICD-10-CM | POA: Diagnosis not present

## 2021-03-03 DIAGNOSIS — M5020 Other cervical disc displacement, unspecified cervical region: Secondary | ICD-10-CM | POA: Diagnosis not present

## 2021-03-13 ENCOUNTER — Other Ambulatory Visit: Payer: Self-pay | Admitting: Internal Medicine

## 2021-03-13 DIAGNOSIS — I10 Essential (primary) hypertension: Secondary | ICD-10-CM

## 2021-03-19 ENCOUNTER — Encounter: Payer: Self-pay | Admitting: Radiation Oncology

## 2021-03-19 ENCOUNTER — Ambulatory Visit
Admission: RE | Admit: 2021-03-19 | Discharge: 2021-03-19 | Disposition: A | Discharge status: Home / Self Care | Payer: Medicare Other | Source: Ambulatory Visit | Attending: Radiation Oncology | Admitting: Radiation Oncology

## 2021-03-19 DIAGNOSIS — C61 Malignant neoplasm of prostate: Secondary | ICD-10-CM | POA: Insufficient documentation

## 2021-03-22 NOTE — Progress Notes (Signed)
  Radiation Oncology         [phone]) [phone] ________________________________  Name: Anthony Daniel. MRN: [id number]  Date: 03/19/2021  DOB: 22-Sep-1950  3D Planning Note   Prostate Brachytherapy Post-Implant Dosimetry  Diagnosis: 70 y.o. gentleman with stage T1c adenocarcinoma of the prostate with a Gleason's score of 4+3 and a PSA of 7.64  Narrative: On a previous date, Anthony Daniel. returned following prostate seed implantation for post implant planning. He underwent CT scan complex simulation to delineate the three-dimensional structures of the pelvis and demonstrate the radiation distribution.  Since that time, the seed localization, and complex isodose planning with dose volume histograms have now been completed.  Results:   Prostate Coverage - The dose of radiation delivered to the 90% or more of the prostate gland (D90) was 101.97% of the prescription dose. This exceeds our goal of greater than 90%. Rectal Sparing - The volume of rectal tissue receiving the prescription dose or higher was 0.0 cc. This falls under our thresholds tolerance of 1.0 cc.  Impression: The prostate seed implant appears to show adequate target coverage and appropriate rectal sparing.  Plan:  The patient will continue to follow with urology for ongoing PSA determinations. I would anticipate a high likelihood for local tumor control with minimal risk for rectal morbidity.  ________________________________  Sheral Apley Tammi Klippel, M.D.

## 2021-04-14 DIAGNOSIS — G959 Disease of spinal cord, unspecified: Secondary | ICD-10-CM | POA: Diagnosis not present

## 2021-04-14 DIAGNOSIS — M4802 Spinal stenosis, cervical region: Secondary | ICD-10-CM | POA: Diagnosis not present

## 2021-04-14 DIAGNOSIS — M542 Cervicalgia: Secondary | ICD-10-CM | POA: Diagnosis not present

## 2021-04-14 DIAGNOSIS — M5030 Other cervical disc degeneration, unspecified cervical region: Secondary | ICD-10-CM | POA: Diagnosis not present

## 2021-04-14 DIAGNOSIS — M4722 Other spondylosis with radiculopathy, cervical region: Secondary | ICD-10-CM | POA: Diagnosis not present

## 2021-04-14 DIAGNOSIS — Z6832 Body mass index (BMI) 32.0-32.9, adult: Secondary | ICD-10-CM | POA: Diagnosis not present

## 2021-04-14 DIAGNOSIS — M5020 Other cervical disc displacement, unspecified cervical region: Secondary | ICD-10-CM | POA: Diagnosis not present

## 2021-04-14 DIAGNOSIS — M5412 Radiculopathy, cervical region: Secondary | ICD-10-CM | POA: Diagnosis not present

## 2021-04-22 DIAGNOSIS — M542 Cervicalgia: Secondary | ICD-10-CM | POA: Diagnosis not present

## 2021-04-22 DIAGNOSIS — R531 Weakness: Secondary | ICD-10-CM | POA: Diagnosis not present

## 2021-04-22 DIAGNOSIS — M2539 Other instability, other specified joint: Secondary | ICD-10-CM | POA: Diagnosis not present

## 2021-04-22 DIAGNOSIS — M2569 Stiffness of other specified joint, not elsewhere classified: Secondary | ICD-10-CM | POA: Diagnosis not present

## 2021-04-25 DIAGNOSIS — Z23 Encounter for immunization: Secondary | ICD-10-CM | POA: Diagnosis not present

## 2021-04-26 ENCOUNTER — Other Ambulatory Visit: Payer: Self-pay | Admitting: Internal Medicine

## 2021-04-26 DIAGNOSIS — I10 Essential (primary) hypertension: Secondary | ICD-10-CM

## 2021-04-26 DIAGNOSIS — E139 Other specified diabetes mellitus without complications: Secondary | ICD-10-CM

## 2021-04-29 DIAGNOSIS — M2539 Other instability, other specified joint: Secondary | ICD-10-CM | POA: Diagnosis not present

## 2021-04-29 DIAGNOSIS — R531 Weakness: Secondary | ICD-10-CM | POA: Diagnosis not present

## 2021-04-29 DIAGNOSIS — M2569 Stiffness of other specified joint, not elsewhere classified: Secondary | ICD-10-CM | POA: Diagnosis not present

## 2021-04-29 DIAGNOSIS — M542 Cervicalgia: Secondary | ICD-10-CM | POA: Diagnosis not present

## 2021-05-01 IMAGING — MR MR HEAD W/O CM
11 series · 48 of 48 positions shown · non-contrast
Comparison: None.

CLINICAL DATA: Ataxia

EXAM:
MRI HEAD WITHOUT CONTRAST
TECHNIQUE: Multiplanar, multiecho pulse sequences of the brain and surrounding
structures were obtained without intravenous contrast.

[Series 5: T1 · sagittal · 4.0mm · 0.75mm/px · 2 of 35 slices shown (1 of 2)]
[im 1/35]
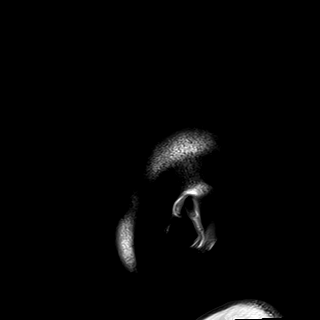
[im 35/35]
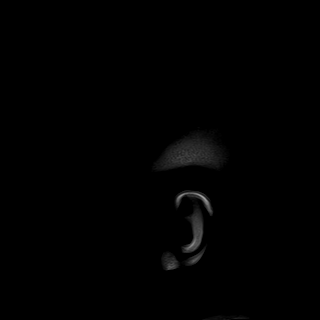

[Series 6: DWI · axial · 3.0mm · 0.94mm/px · z∈[-49,+88]mm · 10 of 164 slices shown (1 of 3)]
[im 1/164]
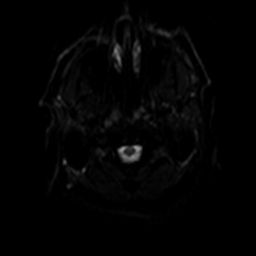
[im 19/164]
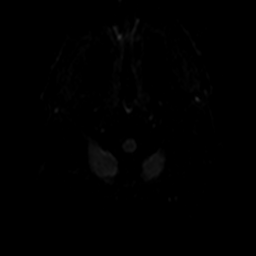
[im 37/164]
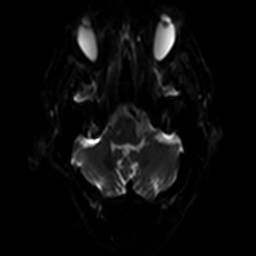
[im 55/164]
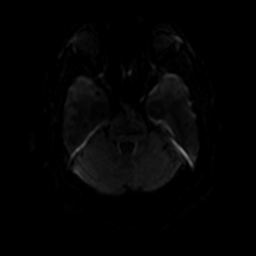
[im 73/164]
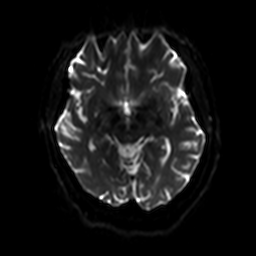
[im 91/164]
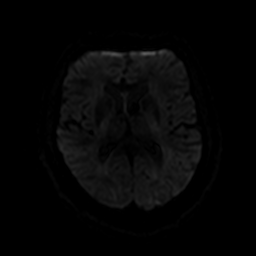
[im 109/164]
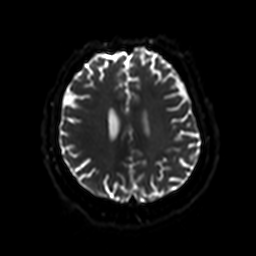
[im 127/164]
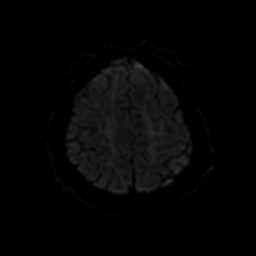
[im 145/164]
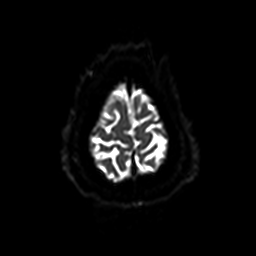
[im 164/164]
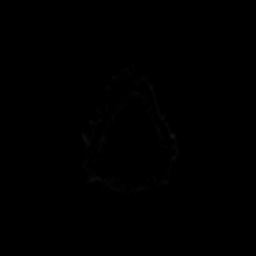

[Series 7: ax dwi_tracew · axial · 3.0mm · 0.94mm/px · z∈[-49,+88]mm · 5 of 82 slices shown]
[im 1/82]
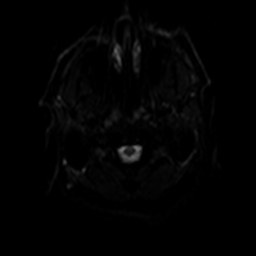
[im 21/82]
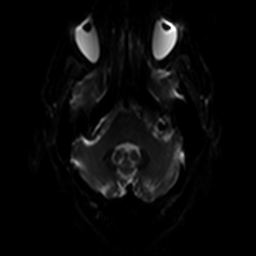
[im 41/82]
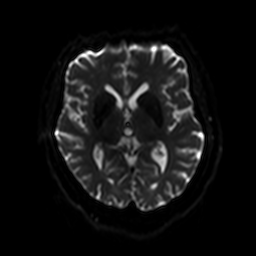
[im 61/82]
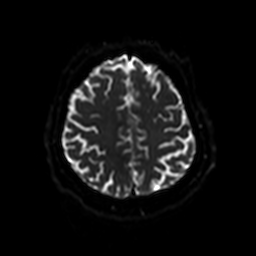
[im 82/82]
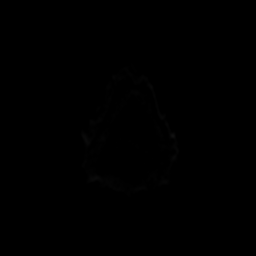

[Series 8: ax dwi_adc · axial · 3.0mm · 0.94mm/px · z∈[-49,+88]mm · 2 of 41 slices shown]
[im 1/41]
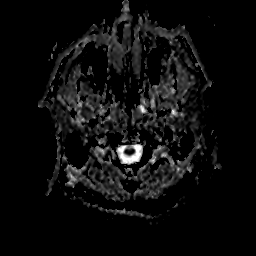
[im 41/41]
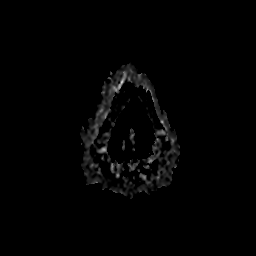

[Series 9: DWI · coronal · 5.0mm · 1.44mm/px · 4 of 68 slices shown (2 of 3)]
[im 1/68]
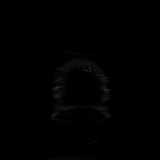
[im 23/68]
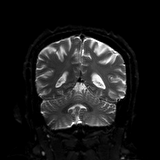
[im 45/68]
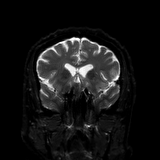
[im 68/68]
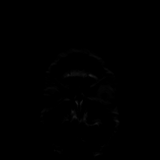

[Series 10: DWI · coronal · 5.0mm · 1.44mm/px · 2 of 34 slices shown (3 of 3)]
[im 1/34]
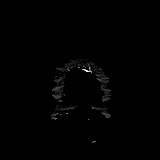
[im 34/34]
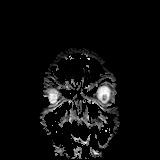

[Series 11: T2 · axial · 4.0mm · 0.36mm/px · z∈[-66,+91]mm · 2 of 32 slices shown (1 of 2)]
[im 1/32]
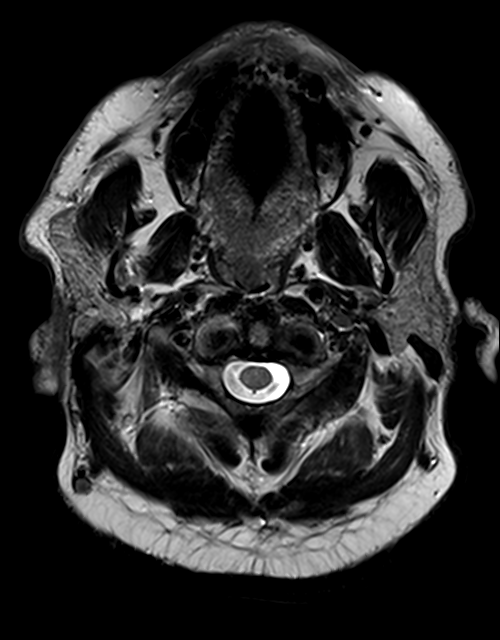
[im 32/32]
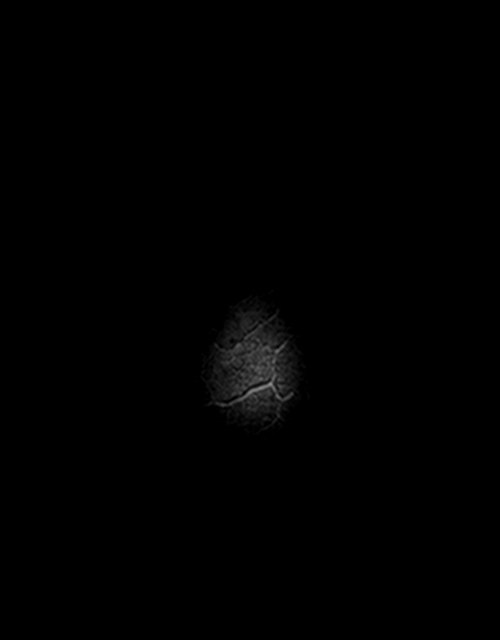

[Series 12: FLAIR · axial · 3.0mm · 0.72mm/px · z∈[-70,+95]mm · 2 of 29 slices shown]
[im 1/29]
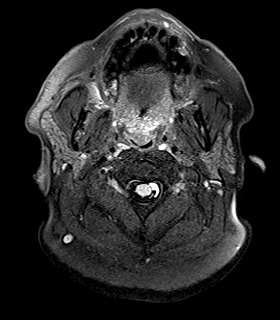
[im 29/29]
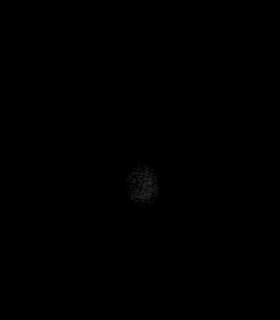

[Series 14: swi_images · axial · 1.5mm · 0.90mm/px · z∈[-63,+88]mm · 6 of 104 slices shown]
[im 1/104]
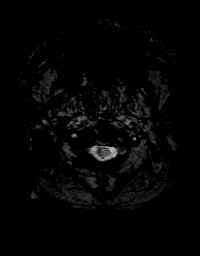
[im 21/104]
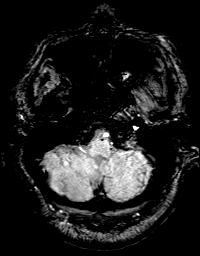
[im 42/104]
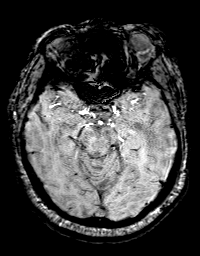
[im 62/104]
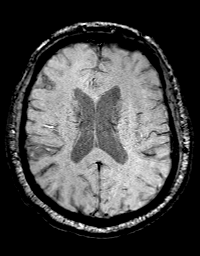
[im 83/104]
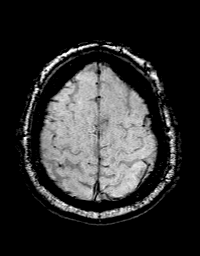
[im 104/104]
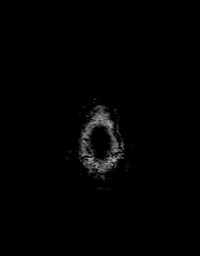

[Series 15: T1 · axial · 1.0mm · 0.94mm/px · z∈[-71,+99]mm · 11 of 175 slices shown (2 of 2)]
[im 1/175]
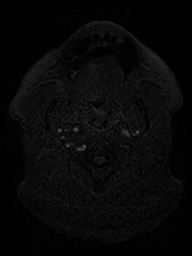
[im 18/175]
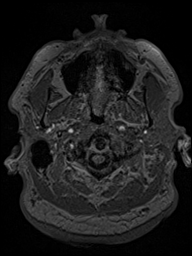
[im 35/175]
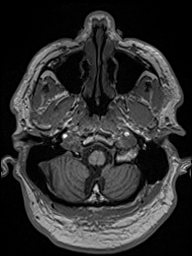
[im 53/175]
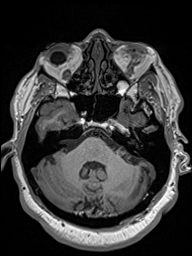
[im 70/175]
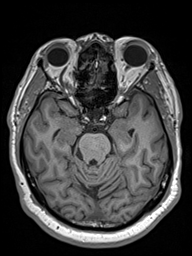
[im 88/175]
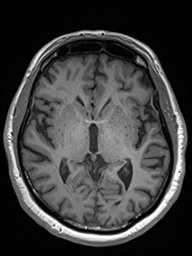
[im 105/175]
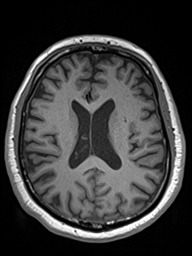
[im 122/175]
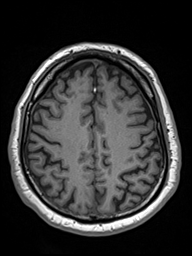
[im 140/175]
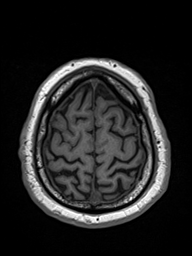
[im 157/175]
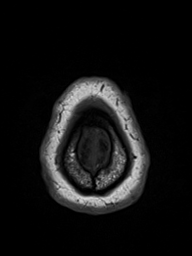
[im 175/175]
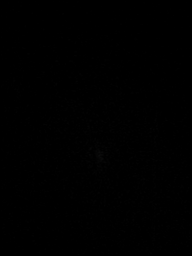

[Series 16: T2 · coronal · 4.5mm · 0.36mm/px · 2 of 35 slices shown (2 of 2)]
[im 1/35]
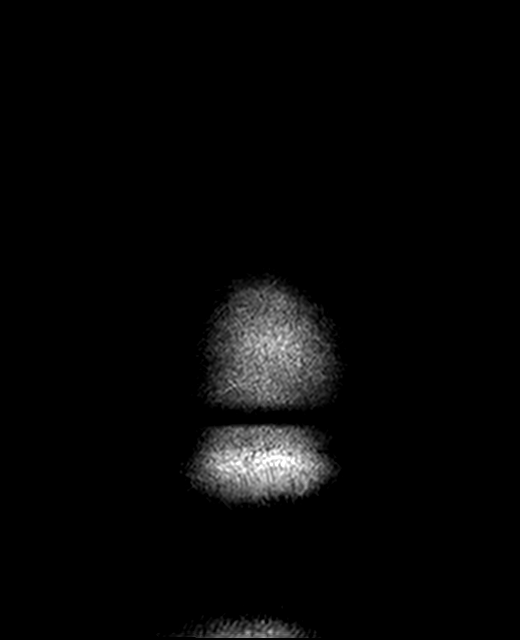
[im 35/35]
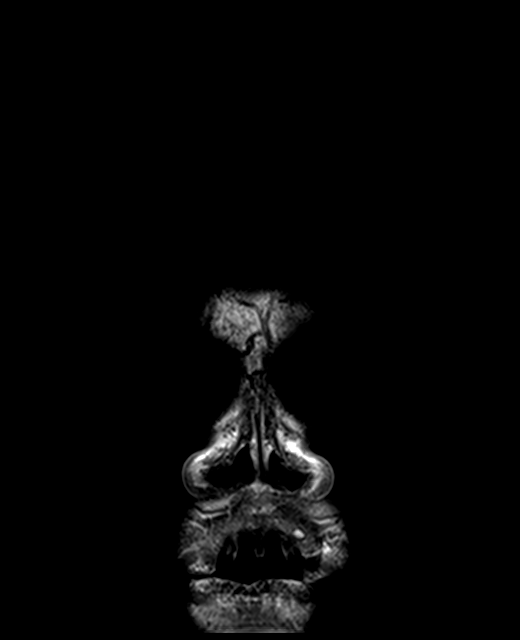

[48 of 48 positions shown; findings below may reference images not displayed]

FINDINGS: Brain: There is no acute infarction or intracranial hemorrhage.
There is no intracranial mass, mass effect, or edema. There is no
hydrocephalus or extra-axial fluid collection. Prominence of the
ventricles and sulci reflects generalized parenchymal volume loss.
Few scattered small foci of T2 hyperintensity in the supratentorial
and pontine white matter are nonspecific but may reflect mild
chronic microvascular ischemic changes.

Vascular: Major vessel flow voids at the skull base are preserved.

Skull and upper cervical spine: Normal marrow signal is preserved.

Sinuses/Orbits: Mild mucosal thickening.  Orbits are unremarkable.

Other: Sella is unremarkable.  Mastoid air cells are clear.
IMPRESSION: No evidence of recent infarction hemorrhage, or mass. Mild chronic
microvascular ischemic changes.

## 2021-05-03 DIAGNOSIS — M2539 Other instability, other specified joint: Secondary | ICD-10-CM | POA: Diagnosis not present

## 2021-05-03 DIAGNOSIS — M2569 Stiffness of other specified joint, not elsewhere classified: Secondary | ICD-10-CM | POA: Diagnosis not present

## 2021-05-03 DIAGNOSIS — R531 Weakness: Secondary | ICD-10-CM | POA: Diagnosis not present

## 2021-05-03 DIAGNOSIS — M542 Cervicalgia: Secondary | ICD-10-CM | POA: Diagnosis not present

## 2021-05-31 DIAGNOSIS — C61 Malignant neoplasm of prostate: Secondary | ICD-10-CM | POA: Diagnosis not present

## 2021-05-31 LAB — PSA: PSA: 0.89

## 2021-06-03 DIAGNOSIS — N182 Chronic kidney disease, stage 2 (mild): Secondary | ICD-10-CM | POA: Diagnosis not present

## 2021-06-03 DIAGNOSIS — Z720 Tobacco use: Secondary | ICD-10-CM | POA: Diagnosis not present

## 2021-06-03 DIAGNOSIS — E1022 Type 1 diabetes mellitus with diabetic chronic kidney disease: Secondary | ICD-10-CM | POA: Diagnosis not present

## 2021-06-03 DIAGNOSIS — Z794 Long term (current) use of insulin: Secondary | ICD-10-CM | POA: Diagnosis not present

## 2021-06-03 DIAGNOSIS — E1165 Type 2 diabetes mellitus with hyperglycemia: Secondary | ICD-10-CM | POA: Diagnosis not present

## 2021-06-03 DIAGNOSIS — E785 Hyperlipidemia, unspecified: Secondary | ICD-10-CM | POA: Diagnosis not present

## 2021-06-07 DIAGNOSIS — C61 Malignant neoplasm of prostate: Secondary | ICD-10-CM | POA: Diagnosis not present

## 2021-06-07 DIAGNOSIS — R3912 Poor urinary stream: Secondary | ICD-10-CM | POA: Diagnosis not present

## 2021-06-07 DIAGNOSIS — N200 Calculus of kidney: Secondary | ICD-10-CM | POA: Diagnosis not present

## 2021-06-07 DIAGNOSIS — N401 Enlarged prostate with lower urinary tract symptoms: Secondary | ICD-10-CM | POA: Diagnosis not present

## 2021-06-13 ENCOUNTER — Other Ambulatory Visit: Payer: Self-pay | Admitting: Internal Medicine

## 2021-06-13 DIAGNOSIS — E139 Other specified diabetes mellitus without complications: Secondary | ICD-10-CM

## 2021-06-16 DIAGNOSIS — M542 Cervicalgia: Secondary | ICD-10-CM | POA: Diagnosis not present

## 2021-06-16 DIAGNOSIS — M545 Low back pain, unspecified: Secondary | ICD-10-CM | POA: Diagnosis not present

## 2021-06-16 DIAGNOSIS — Z6833 Body mass index (BMI) 33.0-33.9, adult: Secondary | ICD-10-CM | POA: Diagnosis not present

## 2021-06-17 DIAGNOSIS — Z87448 Personal history of other diseases of urinary system: Secondary | ICD-10-CM | POA: Diagnosis not present

## 2021-06-17 DIAGNOSIS — N200 Calculus of kidney: Secondary | ICD-10-CM | POA: Diagnosis not present

## 2021-06-17 DIAGNOSIS — R3121 Asymptomatic microscopic hematuria: Secondary | ICD-10-CM | POA: Diagnosis not present

## 2021-06-17 DIAGNOSIS — Z8551 Personal history of malignant neoplasm of bladder: Secondary | ICD-10-CM | POA: Diagnosis not present

## 2021-06-17 DIAGNOSIS — K8689 Other specified diseases of pancreas: Secondary | ICD-10-CM | POA: Diagnosis not present

## 2021-06-22 DIAGNOSIS — H3582 Retinal ischemia: Secondary | ICD-10-CM | POA: Diagnosis not present

## 2021-06-22 DIAGNOSIS — E113392 Type 2 diabetes mellitus with moderate nonproliferative diabetic retinopathy without macular edema, left eye: Secondary | ICD-10-CM | POA: Diagnosis not present

## 2021-06-22 DIAGNOSIS — H2513 Age-related nuclear cataract, bilateral: Secondary | ICD-10-CM | POA: Diagnosis not present

## 2021-06-22 DIAGNOSIS — E113311 Type 2 diabetes mellitus with moderate nonproliferative diabetic retinopathy with macular edema, right eye: Secondary | ICD-10-CM | POA: Diagnosis not present

## 2021-06-28 DIAGNOSIS — N200 Calculus of kidney: Secondary | ICD-10-CM | POA: Diagnosis not present

## 2021-07-12 ENCOUNTER — Emergency Department (HOSPITAL_COMMUNITY): Payer: Medicare Other

## 2021-07-12 ENCOUNTER — Other Ambulatory Visit: Payer: Self-pay

## 2021-07-12 ENCOUNTER — Encounter (HOSPITAL_COMMUNITY): Payer: Self-pay | Admitting: *Deleted

## 2021-07-12 ENCOUNTER — Emergency Department (HOSPITAL_COMMUNITY)
Admission: EM | Admit: 2021-07-12 | Discharge: 2021-07-13 | Disposition: A | Discharge status: Home / Self Care | Payer: Medicare Other | Source: Home / Self Care

## 2021-07-12 DIAGNOSIS — N182 Chronic kidney disease, stage 2 (mild): Secondary | ICD-10-CM | POA: Diagnosis not present

## 2021-07-12 DIAGNOSIS — R079 Chest pain, unspecified: Secondary | ICD-10-CM | POA: Insufficient documentation

## 2021-07-12 DIAGNOSIS — Z794 Long term (current) use of insulin: Secondary | ICD-10-CM | POA: Diagnosis not present

## 2021-07-12 DIAGNOSIS — R112 Nausea with vomiting, unspecified: Secondary | ICD-10-CM | POA: Insufficient documentation

## 2021-07-12 DIAGNOSIS — Z5321 Procedure and treatment not carried out due to patient leaving prior to being seen by health care provider: Secondary | ICD-10-CM | POA: Insufficient documentation

## 2021-07-12 DIAGNOSIS — R109 Unspecified abdominal pain: Secondary | ICD-10-CM | POA: Insufficient documentation

## 2021-07-12 DIAGNOSIS — Z8546 Personal history of malignant neoplasm of prostate: Secondary | ICD-10-CM | POA: Diagnosis not present

## 2021-07-12 DIAGNOSIS — N401 Enlarged prostate with lower urinary tract symptoms: Secondary | ICD-10-CM | POA: Diagnosis not present

## 2021-07-12 DIAGNOSIS — E1322 Other specified diabetes mellitus with diabetic chronic kidney disease: Secondary | ICD-10-CM | POA: Diagnosis not present

## 2021-07-12 DIAGNOSIS — Z9641 Presence of insulin pump (external) (internal): Secondary | ICD-10-CM | POA: Diagnosis not present

## 2021-07-12 DIAGNOSIS — R0789 Other chest pain: Secondary | ICD-10-CM | POA: Diagnosis not present

## 2021-07-12 DIAGNOSIS — I129 Hypertensive chronic kidney disease with stage 1 through stage 4 chronic kidney disease, or unspecified chronic kidney disease: Secondary | ICD-10-CM | POA: Diagnosis not present

## 2021-07-12 DIAGNOSIS — F17210 Nicotine dependence, cigarettes, uncomplicated: Secondary | ICD-10-CM | POA: Diagnosis not present

## 2021-07-12 DIAGNOSIS — I700 Atherosclerosis of aorta: Secondary | ICD-10-CM | POA: Diagnosis not present

## 2021-07-12 DIAGNOSIS — N132 Hydronephrosis with renal and ureteral calculous obstruction: Secondary | ICD-10-CM | POA: Diagnosis not present

## 2021-07-12 DIAGNOSIS — R1012 Left upper quadrant pain: Secondary | ICD-10-CM | POA: Diagnosis not present

## 2021-07-12 DIAGNOSIS — Z8551 Personal history of malignant neoplasm of bladder: Secondary | ICD-10-CM | POA: Diagnosis not present

## 2021-07-12 LAB — COMPREHENSIVE METABOLIC PANEL
ALT: 17 U/L (ref 0–44)
AST: 20 U/L (ref 15–41)
Albumin: 3.6 g/dL (ref 3.5–5.0)
Alkaline Phosphatase: 93 U/L (ref 38–126)
Anion gap: 11 (ref 5–15)
BUN: 19 mg/dL (ref 8–23)
CO2: 24 mmol/L (ref 22–32)
Calcium: 8.8 mg/dL — ABNORMAL LOW (ref 8.9–10.3)
Chloride: 98 mmol/L (ref 98–111)
Creatinine, Ser: 1.98 mg/dL — ABNORMAL HIGH (ref 0.61–1.24)
GFR, Estimated: 36 mL/min — ABNORMAL LOW (ref >60)
Glucose, Bld: 224 mg/dL — ABNORMAL HIGH (ref 70–99)
Potassium: 4.1 mmol/L (ref 3.5–5.1)
Sodium: 133 mmol/L — ABNORMAL LOW (ref 135–145)
Total Bilirubin: 0.9 mg/dL (ref 0.3–1.2)
Total Protein: 6.8 g/dL (ref 6.5–8.1)

## 2021-07-12 LAB — URINALYSIS, ROUTINE W REFLEX MICROSCOPIC
Bilirubin Urine: NEGATIVE
Glucose, UA: NEGATIVE mg/dL
Ketones, ur: 15 mg/dL — AB
Leukocytes,Ua: NEGATIVE
Nitrite: NEGATIVE
Protein, ur: NEGATIVE mg/dL
Specific Gravity, Urine: 1.025 (ref 1.005–1.030)
pH: 5.5 (ref 5.0–8.0)

## 2021-07-12 LAB — CBC
HCT: 42.8 % (ref 39.0–52.0)
Hemoglobin: 14.5 g/dL (ref 13.0–17.0)
MCH: 30.3 pg (ref 26.0–34.0)
MCHC: 33.9 g/dL (ref 30.0–36.0)
MCV: 89.5 fL (ref 80.0–100.0)
Platelets: 174 10*3/uL (ref 150–400)
RBC: 4.78 MIL/uL (ref 4.22–5.81)
RDW: 14.3 % (ref 11.5–15.5)
WBC: 15.2 10*3/uL — ABNORMAL HIGH (ref 4.0–10.5)
nRBC: 0 % (ref 0.0–0.2)

## 2021-07-12 LAB — URINALYSIS, MICROSCOPIC (REFLEX): Bacteria, UA: NONE SEEN

## 2021-07-12 LAB — TROPONIN I (HIGH SENSITIVITY)
Troponin I (High Sensitivity): 18 ng/L — ABNORMAL HIGH (ref <18)
Troponin I (High Sensitivity): 18 ng/L — ABNORMAL HIGH (ref <18)

## 2021-07-12 LAB — LIPASE, BLOOD: Lipase: 26 U/L (ref 11–51)

## 2021-07-12 NOTE — ED Triage Notes (Signed)
Pt reports last night had onset of abd discomfort, followed by fluttering/chest pain. Chest pain went away but now has left side lower abd pain. Hx of kidney stones but states this does not feel like a stone. No urinary symptoms. Had n/v last night.

## 2021-07-12 NOTE — ED Provider Notes (Signed)
Emergency Medicine Provider Triage Evaluation Note  Schuyler Olden. , a 70 y.o. male  was evaluated in triage.  Pt complains of left flank pain for two days with nausea / vomiting x 1. Hx kidney stones. Also endorses chest pain / palpitations last night. No hx acs. Hx DM1. No shortness of breath. Chest pain resolved since last night. No dysuria, hematuria.  Review of Systems  Positive: Chest pain, flank pain Negative: Shortness of breath  Physical Exam  BP (!) 153/74 (BP Location: Right Arm)    Pulse 79    Temp 98.7 F (37.1 C) (Oral)    Resp 18    SpO2 100%  Gen:   Awake, no distress   Resp:  Normal effort  MSK:   Moves extremities without difficulty  Other:  Ttp left flank, some CVA tenderness left  Medical Decision Making  Medically screening exam initiated at 4:38 PM.  Appropriate orders placed.  [name] was informed that the remainder of the evaluation will be completed by another provider, this initial triage assessment does not replace that evaluation, and the importance of remaining in the ED until their evaluation is complete.  Chest pain, flank pain workup initiated   Anselmo Pickler, PA-C 07/12/21 [place], [place], DO 07/17/21 1545

## 2021-07-13 ENCOUNTER — Ambulatory Visit (HOSPITAL_COMMUNITY): Payer: Medicare Other | Admitting: Anesthesiology

## 2021-07-13 ENCOUNTER — Encounter (HOSPITAL_COMMUNITY): Payer: Self-pay | Admitting: Urology

## 2021-07-13 ENCOUNTER — Encounter (HOSPITAL_COMMUNITY)
Admission: RE | Disposition: A | Discharge status: Home / Self Care | Payer: Self-pay | Source: Ambulatory Visit | Attending: Urology

## 2021-07-13 ENCOUNTER — Other Ambulatory Visit: Payer: Self-pay | Admitting: Urology

## 2021-07-13 ENCOUNTER — Ambulatory Visit (HOSPITAL_COMMUNITY)
Admission: RE | Admit: 2021-07-13 | Discharge: 2021-07-13 | Disposition: A | Discharge status: Home / Self Care | Payer: Medicare Other | Source: Ambulatory Visit | Attending: Urology | Admitting: Urology

## 2021-07-13 ENCOUNTER — Ambulatory Visit (HOSPITAL_COMMUNITY): Payer: Medicare Other

## 2021-07-13 DIAGNOSIS — N132 Hydronephrosis with renal and ureteral calculous obstruction: Secondary | ICD-10-CM | POA: Diagnosis not present

## 2021-07-13 DIAGNOSIS — Z794 Long term (current) use of insulin: Secondary | ICD-10-CM | POA: Diagnosis not present

## 2021-07-13 DIAGNOSIS — Z9641 Presence of insulin pump (external) (internal): Secondary | ICD-10-CM | POA: Diagnosis not present

## 2021-07-13 DIAGNOSIS — F17210 Nicotine dependence, cigarettes, uncomplicated: Secondary | ICD-10-CM | POA: Insufficient documentation

## 2021-07-13 DIAGNOSIS — N202 Calculus of kidney with calculus of ureter: Secondary | ICD-10-CM | POA: Diagnosis not present

## 2021-07-13 DIAGNOSIS — N182 Chronic kidney disease, stage 2 (mild): Secondary | ICD-10-CM | POA: Diagnosis not present

## 2021-07-13 DIAGNOSIS — Z8546 Personal history of malignant neoplasm of prostate: Secondary | ICD-10-CM | POA: Diagnosis not present

## 2021-07-13 DIAGNOSIS — E1322 Other specified diabetes mellitus with diabetic chronic kidney disease: Secondary | ICD-10-CM | POA: Insufficient documentation

## 2021-07-13 DIAGNOSIS — N401 Enlarged prostate with lower urinary tract symptoms: Secondary | ICD-10-CM | POA: Insufficient documentation

## 2021-07-13 DIAGNOSIS — Z8551 Personal history of malignant neoplasm of bladder: Secondary | ICD-10-CM | POA: Insufficient documentation

## 2021-07-13 DIAGNOSIS — I129 Hypertensive chronic kidney disease with stage 1 through stage 4 chronic kidney disease, or unspecified chronic kidney disease: Secondary | ICD-10-CM | POA: Insufficient documentation

## 2021-07-13 DIAGNOSIS — E1022 Type 1 diabetes mellitus with diabetic chronic kidney disease: Secondary | ICD-10-CM | POA: Diagnosis not present

## 2021-07-13 HISTORY — PX: CYSTOSCOPY/URETEROSCOPY/HOLMIUM LASER/STENT PLACEMENT: SHX6546

## 2021-07-13 LAB — GLUCOSE, CAPILLARY
Glucose-Capillary: 130 mg/dL — ABNORMAL HIGH (ref 70–99)
Glucose-Capillary: 106 mg/dL — ABNORMAL HIGH (ref 70–99)
Glucose-Capillary: 114 mg/dL — ABNORMAL HIGH (ref 70–99)

## 2021-07-13 SURGERY — CYSTOSCOPY/URETEROSCOPY/HOLMIUM LASER/STENT PLACEMENT
Anesthesia: General | Laterality: Left

## 2021-07-13 MED ORDER — LIDOCAINE HCL (CARDIAC) PF 100 MG/5ML IV SOSY
PREFILLED_SYRINGE | INTRAVENOUS | Status: DC | PRN
  Administered 2021-07-13: 60 mg via INTRAVENOUS

## 2021-07-13 MED ORDER — ONDANSETRON HCL 4 MG/2ML IJ SOLN: 4.0000 mg | Freq: Once | INTRAMUSCULAR | Status: DC | PRN

## 2021-07-13 MED ORDER — SUCCINYLCHOLINE CHLORIDE 200 MG/10ML IV SOSY
PREFILLED_SYRINGE | INTRAVENOUS | Status: AC
  Filled 2021-07-13: qty 10

## 2021-07-13 MED ORDER — PHENYLEPHRINE HCL (PRESSORS) 10 MG/ML IV SOLN
INTRAVENOUS | Status: AC
  Filled 2021-07-13: qty 1

## 2021-07-13 MED ORDER — FENTANYL CITRATE (PF) 100 MCG/2ML IJ SOLN
INTRAMUSCULAR | Status: AC
  Filled 2021-07-13: qty 2

## 2021-07-13 MED ORDER — ACETAMINOPHEN 500 MG PO TABS
1000.0000 mg | ORAL_TABLET | Freq: Once | ORAL | Status: AC
  Administered 2021-07-13: 16:00:00 1000 mg via ORAL
  Filled 2021-07-13: qty 2

## 2021-07-13 MED ORDER — LIDOCAINE 2% (20 MG/ML) 5 ML SYRINGE
INTRAMUSCULAR | Status: AC
  Filled 2021-07-13: qty 5

## 2021-07-13 MED ORDER — CEFAZOLIN SODIUM-DEXTROSE 2-3 GM-%(50ML) IV SOLR
INTRAVENOUS | Status: DC | PRN
  Administered 2021-07-13: 2 g via INTRAVENOUS

## 2021-07-13 MED ORDER — PHENYLEPHRINE 40 MCG/ML (10ML) SYRINGE FOR IV PUSH (FOR BLOOD PRESSURE SUPPORT)
PREFILLED_SYRINGE | INTRAVENOUS | Status: DC | PRN
  Administered 2021-07-13 (×2): 120 ug via INTRAVENOUS
  Administered 2021-07-13 (×2): 80 ug via INTRAVENOUS

## 2021-07-13 MED ORDER — CEFDINIR 300 MG PO CAPS: 300.0000 mg | ORAL_CAPSULE | Freq: Two times a day (BID) | ORAL | 0 refills | Status: DC

## 2021-07-13 MED ORDER — DEXAMETHASONE SODIUM PHOSPHATE 10 MG/ML IJ SOLN
INTRAMUSCULAR | Status: AC
  Filled 2021-07-13: qty 1

## 2021-07-13 MED ORDER — CHLORHEXIDINE GLUCONATE 0.12 % MT SOLN
15.0000 mL | OROMUCOSAL | Status: AC
  Administered 2021-07-13: 16:00:00 15 mL via OROMUCOSAL

## 2021-07-13 MED ORDER — ONDANSETRON HCL 4 MG/2ML IJ SOLN
INTRAMUSCULAR | Status: AC
  Filled 2021-07-13: qty 2

## 2021-07-13 MED ORDER — PHENYLEPHRINE HCL-NACL 20-0.9 MG/250ML-% IV SOLN
INTRAVENOUS | Status: DC | PRN
  Administered 2021-07-13: 40 ug/min via INTRAVENOUS

## 2021-07-13 MED ORDER — CEFAZOLIN SODIUM-DEXTROSE 2-4 GM/100ML-% IV SOLN: 2.0000 g | Freq: Once | INTRAVENOUS | Status: DC

## 2021-07-13 MED ORDER — FENTANYL CITRATE PF 50 MCG/ML IJ SOSY
25.0000 ug | PREFILLED_SYRINGE | INTRAMUSCULAR | Status: DC | PRN
Start: 2021-07-13 — End: 2021-07-14

## 2021-07-13 MED ORDER — LACTATED RINGERS IV SOLN: INTRAVENOUS | Status: DC

## 2021-07-13 MED ORDER — PROPOFOL 10 MG/ML IV BOLUS
INTRAVENOUS | Status: DC | PRN
  Administered 2021-07-13: 30 mg via INTRAVENOUS
  Administered 2021-07-13: 170 mg via INTRAVENOUS

## 2021-07-13 MED ORDER — HYDROCODONE-ACETAMINOPHEN 5-325 MG PO TABS: 1.0000 | ORAL_TABLET | ORAL | 0 refills | Status: DC | PRN

## 2021-07-13 MED ORDER — CEFAZOLIN SODIUM-DEXTROSE 2-4 GM/100ML-% IV SOLN
INTRAVENOUS | Status: AC
  Filled 2021-07-13: qty 100

## 2021-07-13 MED ORDER — FENTANYL CITRATE (PF) 100 MCG/2ML IJ SOLN
INTRAMUSCULAR | Status: DC | PRN
  Administered 2021-07-13: 100 ug via INTRAVENOUS

## 2021-07-13 MED ORDER — ONDANSETRON HCL 4 MG/2ML IJ SOLN
INTRAMUSCULAR | Status: DC | PRN
  Administered 2021-07-13: 4 mg via INTRAVENOUS

## 2021-07-13 MED ORDER — SODIUM CHLORIDE 0.9 % IR SOLN
Status: DC | PRN
  Administered 2021-07-13: 3000 mL via INTRAVESICAL

## 2021-07-13 MED ORDER — DEXAMETHASONE SODIUM PHOSPHATE 10 MG/ML IJ SOLN
INTRAMUSCULAR | Status: DC | PRN
  Administered 2021-07-13: 8 mg via INTRAVENOUS

## 2021-07-13 MED ORDER — PROPOFOL 10 MG/ML IV BOLUS
INTRAVENOUS | Status: AC
  Filled 2021-07-13: qty 20

## 2021-07-13 SURGICAL SUPPLY — 22 items
BAG URO CATCHER STRL LF (MISCELLANEOUS) ×3 IMPLANT
BASKET LASER NITINOL 1.9FR (BASKET) IMPLANT
BASKET ZERO TIP NITINOL 2.4FR (BASKET) IMPLANT
CATH URETL OPEN END 6FR 70 (CATHETERS) ×1 IMPLANT
CLOTH BEACON ORANGE TIMEOUT ST (SAFETY) ×1 IMPLANT
EXTRACTOR STONE 1.7FRX115CM (UROLOGICAL SUPPLIES) IMPLANT
GLOVE SURG ENC MOIS LTX SZ7.5 (GLOVE) ×7 IMPLANT
GOWN STRL REUS W/TWL XL LVL3 (GOWN DISPOSABLE) ×5 IMPLANT
GUIDEWIRE ANG ZIPWIRE 038X150 (WIRE) IMPLANT
GUIDEWIRE STR DUAL SENSOR (WIRE) ×3 IMPLANT
KIT TURNOVER KIT A (KITS) ×2 IMPLANT
LASER FIB FLEXIVA PULSE ID 365 (Laser) IMPLANT
MANIFOLD NEPTUNE II (INSTRUMENTS) ×3 IMPLANT
PACK CYSTO (CUSTOM PROCEDURE TRAY) ×3 IMPLANT
SHEATH NAVIGATOR HD 11/13X28 (SHEATH) IMPLANT
SHEATH NAVIGATOR HD 11/13X36 (SHEATH) ×2 IMPLANT
STENT URET 6FRX26 CONTOUR (STENTS) ×2 IMPLANT
TRACTIP FLEXIVA PULS ID 200XHI (Laser) IMPLANT
TRACTIP FLEXIVA PULSE ID 200 (Laser) ×3
TUBING CONNECTING 10 (TUBING) ×2 IMPLANT
TUBING CONNECTING 10' (TUBING) ×1
TUBING UROLOGY SET (TUBING) ×3 IMPLANT

## 2021-07-13 NOTE — Anesthesia Procedure Notes (Signed)
Procedure Name: LMA Insertion Date/Time: 07/13/2021 6:06 PM Performed by: Raenette Rover, CRNA Pre-anesthesia Checklist: Patient identified, Emergency Drugs available, Suction available and Patient being monitored Patient Re-evaluated:Patient Re-evaluated prior to induction Oxygen Delivery Method: Circle system utilized Preoxygenation: Pre-oxygenation with 100% oxygen Induction Type: IV induction Ventilation: Mask ventilation without difficulty LMA: LMA with gastric port inserted LMA Size: 4.0 Number of attempts: 1 Placement Confirmation: positive ETCO2 and breath sounds checked- equal and bilateral Tube secured with: Tape Dental Injury: Teeth and Oropharynx as per pre-operative assessment

## 2021-07-13 NOTE — Transfer of Care (Signed)
Immediate Anesthesia Transfer of Care Note  Patient: Anthony Daniel.  Procedure(s) Performed: CYSTOSCOPY/URETEROSCOPY/HOLMIUM LASER/STENT PLACEMENT (Left)  Patient Location: PACU  Anesthesia Type:General  Level of Consciousness: drowsy and patient cooperative  Airway & Oxygen Therapy: Patient Spontanous Breathing and Patient connected to face mask oxygen  Post-op Assessment: Report given to RN and Post -op Vital signs reviewed and stable  Post vital signs: Reviewed and stable  Last Vitals:  Vitals Value Taken Time  BP 118/62 1922  Temp    Pulse 70 07/13/21 1921  Resp 13 07/13/21 1921  SpO2 100 % 07/13/21 1921  Vitals shown include unvalidated device data.  Last Pain:  Vitals:   07/13/21 1600  TempSrc: Oral  PainSc: 1          Complications: No notable events documented.

## 2021-07-13 NOTE — ED Notes (Signed)
Patient states he is going to follow up with his specialist d/t wait time

## 2021-07-13 NOTE — Op Note (Signed)
Operative Note  Preoperative diagnosis:  1.  Left renal and ureteral calculi  Postoperative diagnosis: 1.  Left renal and ureteral calculi  Procedure(s): 1.  Cystoscopy with left retrograde pyelogram, left ureteroscopy with laser lithotripsy, ureteral stent placement  Surgeon: Link Snuffer, MD  Assistants: None  Anesthesia: General  Complications: None immediate  EBL: Minimal  Specimens: 1.  None  Drains/Catheters: 1.  6 x 26 double-J ureteral stent  Intraoperative findings: 1.  Normal anterior urethra 2.  Borderline obstructing prostate 3.  7 millimeter proximal left ureteral calculus fragmented to smaller fragments and retropulsed into the kidney 4.  Multiple renal calculi on the left that were fragmented to tiny fragments.  Retrograde pyelogram revealed mild hydronephrosis.  Indication: 70 year old male with left renal and ureteral calculi presents for urgent intervention.  Description of procedure:  The patient was identified and consent was obtained.  The patient was taken to the operating room and placed in the supine position.  The patient was placed under general anesthesia.  Perioperative antibiotics were administered.  The patient was placed in dorsal lithotomy.  Patient was prepped and draped in a standard sterile fashion and a timeout was performed.  A 21 French rigid cystoscope was advanced into the urethra and into the bladder.  Complete cystoscopy was performed with no abnormal findings.  The left ureter was cannulated with a sensor wire which was advanced up to the kidney under fluoroscopic guidance.  Semirigid ureteroscopy was performed up to the proximal ureter and the stone was encountered which was fragmented to smaller fragments.  These fragments were retropulsed into the kidney.  I passed a second wire through the scope and withdrew the scope.  I passed a 12 x 14 ureteral access sheath over this wire under continuous fluoroscopic guidance up to the renal pelvis  and withdrew the inner sheath and wire.  Digital ureteroscopy encountered multiple large stones which were then dusted to tiny fragments.  Once there were no clinically significant stone fragments remaining and only tiny fragments, I shot a retrograde pyelogram through the scope with findings noted above.  I then withdrew the scope along with the access sheath visualizing the entire ureter upon removal.  There were no ureteral calculi and no obvious ureteral injury.  I backloaded the wire onto rigid cystoscope and advanced that into the bladder followed by routine placement of a 6 x 26 double-J ureteral stent.  Fluoroscopy confirmed proximal placement and direct visualization confirmed a good coil within the bladder.  I drained the bladder and withdrew the scope.  Patient tolerated the procedure well and was stable postoperative.  Plan: Follow-up in 1 week for stent removal.

## 2021-07-13 NOTE — Anesthesia Postprocedure Evaluation (Signed)
Anesthesia Post Note  Patient: Anthony Daniel.  Procedure(s) Performed: CYSTOSCOPY/URETEROSCOPY/HOLMIUM LASER/STENT PLACEMENT (Left)     Patient location during evaluation: PACU Anesthesia Type: General Level of consciousness: awake and alert Pain management: pain level controlled Vital Signs Assessment: post-procedure vital signs reviewed and stable Respiratory status: spontaneous breathing, nonlabored ventilation and respiratory function stable Cardiovascular status: stable and blood pressure returned to baseline Anesthetic complications: no   No notable events documented.  Last Vitals:  Vitals:   07/13/21 1945 07/13/21 2000  BP: (!) 156/103 (!) 121/59  Pulse:  72  Resp:  (!) 185  Temp:  36.9 C  SpO2:  98%    Last Pain:  Vitals:   07/13/21 2000  TempSrc:   PainSc: [place]

## 2021-07-13 NOTE — Anesthesia Preprocedure Evaluation (Addendum)
Anesthesia Evaluation  Patient identified by MRN, date of birth, ID band Patient awake    Reviewed: Allergy & Precautions, NPO status , Patient's Chart, lab work & pertinent test results  History of Anesthesia Complications (+) PONV and history of anesthetic complications  Airway Mallampati: II  TM Distance: >3 FB Neck ROM: Full    Dental  (+) Dental Advisory Given, Partial Upper, Implants   Pulmonary sleep apnea , Current SmokerPatient did not abstain from smoking.,    Pulmonary exam normal        Cardiovascular hypertension, Pt. on medications Normal cardiovascular exam     Neuro/Psych negative neurological ROS  negative psych ROS   GI/Hepatic negative GI ROS, Neg liver ROS,   Endo/Other  diabetes, Type 1, Insulin Dependent Has insulin pump, but not using currently   Renal/GU CRFRenal disease    Prostate cancer Bladder cancer     Musculoskeletal  (+) Arthritis ,   Abdominal   Peds  Hematology negative hematology ROS (+)   Anesthesia Other Findings   Reproductive/Obstetrics                            Anesthesia Physical Anesthesia Plan  ASA: 3  Anesthesia Plan: General   Post-op Pain Management:    Induction: Intravenous  PONV Risk Score and Plan: 3 and Treatment may vary due to age or medical condition, Ondansetron and Dexamethasone  Airway Management Planned: LMA  Additional Equipment: None  Intra-op Plan:   Post-operative Plan: Extubation in OR  Informed Consent: I have reviewed the patients History and Physical, chart, labs and discussed the procedure including the risks, benefits and alternatives for the proposed anesthesia with the patient or authorized representative who has indicated his/her understanding and acceptance.     Dental advisory given  Plan Discussed with: CRNA and Anesthesiologist  Anesthesia Plan Comments:        Anesthesia Quick  Evaluation

## 2021-07-13 NOTE — H&P (Signed)
CC/HPI: CC: History of prostate cancer, BPH with BOO and a history of stones.   06/07/21: Anthony Daniel returns today in f/u for his history of prostate cancer that was treated on 02/09/21 with Seeds and spaceOAR. He had a small perineal superficial infection that has resolved. His PSA is down to 0.89. He remains on tamsulosin. He has an IPSS of 16. He has some morning intermittency. He has some urgency that is a bit worse with very minor incontinence. He has had no hematuria. He has passed 3-4 stones since his last visit. he has no flank pain currently. He has 3-10 RBC's but that is a chronic finding. KUB today shows a possible right distal stone and bilateral lower pole stones. He remains on indapamide and potassium citrate for hyperoxaluria and hyperuricosuria on evaluation from [facility]. He has ED and is using trimix from Dr. Dimas Millin at [facility].   07/13/2021  Patient went to the emergency department yesterday for severe left-sided flank pain. He underwent CT scan of the abdomen and pelvis that revealed mild left-sided hydronephrosis secondary to a 7 mm mid left ureteral calculus. He also had a couple of larger left renal calculi measuring up to 14 mm. He continues to have some intermittent nausea. Pain is currently moderately controlled but he does have some intermittent episodes of pain. Troponin was normal at the emergency department. Chest x-ray was performed that showed a developing infiltrate that may indicate developing multifocal pneumonia or interstitial lung disease. He denies any chest pain, shortness of breath, cough, fever. He did have a creatinine of 2. He had leukocytosis of 15.     ALLERGIES: Contrast Media Ready-Box MISC - Nausea Dilaudid Iodine - Nausea Latex Gloves MISC - Skin Rash oxycodone - Nausea rosuvastatin - cramping Statins      Notes: atorvastatin cramping  tape  ramipril cough  l    MEDICATIONS: Aspirin  Tamsulosin Hcl 0.4 mg capsule 1 capsule PO Daily  Ciclopirox  Ecotrin 325  mg tablet, delayed release Oral  Fluticasone Propionate 50 mcg/actuation spray, suspension Nasal  HumaLOG 100 UNIT/ML Subcutaneous Solution Subcutaneous  Indapamide 1.25 mg tablet 1 tablet PO Daily  Irbesartan  Lantus  Livalo  Metaxalone  Novolog  Potassium Citrate Er 15 meq (1,620 mg) tablet, extended release 1 tablet PO TID  Systane Ultra SOLN Ophthalmic  Tramadol Hcl  Trimix  Xyzal  Zetia 10 mg tablet Oral     GU PSH: Cysto Remove Stent FB Sim - 2021, 2020 Endoscopy Ureter, Right - 2021 ESWL - 2012, 2012, 2008 PCNL, Right - 2021 Percut Stone Removal >2cm - 2017 Prostate Needle Biopsy - 09/23/2020, 2020 Stone Removal Nephrost Tube, Right - 2021 TRANSPERI NEEDLE PLACE, PROS - 02/09/2021 Transperineal Plmt Biodegradable Matrl 1/Mlt Njx - 02/09/2021 Ureteroscopic laser litho, Right - 2021, Right - 2020, Left - 2020, 2016       PSH Notes: Dental Surgery, Percutaneous Lithotomy For Stone Over 2cm., Cystoscopy Ureteroscopy Lithotripsy Incl Insert Indwelling Ureter Stent, Preventive medication therapy needed, Lithotripsy, Lithotripsy, Lithotripsy, Cystoscopy Bladder Tumor   cervical disc replacement 12/15/20   NON-GU PSH: Cystourethroscopy, With Ureteroscopy And/or Pyeloscopy; With Endoscopic Laser Treatment Of Ureteral NJX PX ANTEGRDE NFROSGRM &/URTRGRM EXSTNG ACESS, Right - 2021 Surgical Pathology, Gross And Microscopic Examination For Prostate Needle - 09/23/2020, 2020     GU PMH: Microscopic hematuria - 06/17/2021, 3-10 RBC's is chronic. , - 06/07/2021, He has 3-10 RBC's and a possible urethral stone on [place]. He was told to keep an eye out. , - 09/23/2020,  Asymptomatic microscopic hematuria, - October 31, 2014 BPH w/LUTS, his LUTS are worse since the seed implant with some urgency and rare UUI. I suggested he try doubling the tamsulosin. - 06/07/2021, His voiding symptoms have improved with stone removal. he will continue the tamsulosin. , - 04/06/2020, Benign prostatic hyperplasia with urinary  obstruction, - 10/31/2015 ED due to arterial insufficiency, He is on trimix which he gets from a [facility]. He will get him his dose and we can source it from [facility] if he would like. - 06/07/2021, Erectile dysfunction due to arterial insufficiency, - October 31, 2015 Prostate Cancer, PSA is down to 0.89. He will return in 4months with a PSA. - 06/07/2021, - 03/02/2021, - 01/26/2021 (Stable), - 10/23/2020, He has T1c Nx Mx Gleason 7(4+3) prostate cancer with grade progression on surveillance. He has BPH with moderate LUTS and ED. He has a history of superficial bladder cancer as well. I discussed the options for active treatment since he has progressed on surveillance. I reviewed the risks and benefits of RALP, EXRT (I mentioned several forms) Brachytherapy ( both permanent and HDR options) Cryotherapy and HIfU and briefly discussed ADT in association with EXRT and the side effects of that therapy. He is leaning toward radiation therapy option and we did discuss with increased risks of secondary malignancy, particularly in the face of his history of bladder cancer. I discussed the use of SpaceOAR and the associated risks and benefits. He is schedule for an appointment with the [facility] on 10/23/20. He is on the borderline for needing staging studies with his unfavorable intermediate risk but low volume disease and LNI probability of only 8% so I will defer any decisions regarding staging studies to the [facility]. He had questions about an outside second opinioin and I suggested [facility] since he is already established with Dr. Kandee Keen at [facility] for his history of stones. , - 10/13/2020, I will call with the results. , - 09/23/2020, His PSA is stable but he is over 18 months out from his last biopsy so I will get him set up for a surveillance biopsy and will send levaquin and have him get rocephin as well. Risks of bleeding, infection and voiding difficulty reviewed. , - 04/06/2020, 31-Oct-2019, His PSA is up to 6.08. I am going to repeat the PSA  in 3 months and if it continues to rise, I will get him set up for a surveillance biopsy. He had very low risk disease on his biopsy a year ago. , 10/31/19, He has very low risk disease and his PSA is coming back down. Repeat PSA and exam in 6 months. , 10-31-18, His PSA jumped but that is most consistent with a reaction to the biopsy. I will repeat in 3 months. , 10/31/2018, He has very low risk, Gleason 6(3+3) T1c Nx Mx prostate cancer with BPH with moderate LUTS and ED. I discussed Active surveillance, RALP, EXRT and Seeds and briefly mentioned cryotherapy and HIFU. He is an excellent candidate for active surveillance and would like to pursue that option for the time being. , Oct 31, 2018 Renal calculus, He has progressive bilateral renal stones and a possible right distal ureteral stone. I have refilled his meds and will have him return for a CT stone study. - 06/07/2021, October 31, 2019, 31-Oct-2019, He has passed several stones since his last visit and the stone burden appears reduced on KUB today. He is on indapamide from his PCP for his HTN and that should help with the  stones as well. He will return in 6 months with a KUB. , - 2021, He has reduced stone burden but is not completely free of stones. He will continue the potassium citrate and I reviewed the dietary restrictions with him as well. BMP and KUB in 6 months. , - 2020, He has no hydro but has bilateral residual stone disease. I am going to have him return in 3 months with a KUB and we will discuss ESWL for the RLP stone at that time. , - 2020, Nephrolithiasis, - 2017, Kidney stone on left side, - 2017, Kidney stone on right side, - 2016 Urinary Urgency - 06/07/2021 Weak Urinary Stream - 06/07/2021, Weak urinary stream, - 2017 Inflammatory disorder of unspecified male genital organ - 03/02/2021, - 02/15/2021 History of urolithiasis - 09/23/2020, He is doing well since his last procedure but did pass a small stone today. I have refilled the potassium citrate. , -  04/06/2020 Elevated PSA - 04/06/2020, - 2020, His PSA continues to rise slowly. I will repeat this again in 3-4 months and if it continues to rise we will need to consider a repeat biopsy., - 2019, His PSA is down a bit. I will repeat in 6 months. , - 2019, His PSA is minimally changed. I will have him return in 3 months with another PSA with a week of abstinence since he didn't do that this time. , - 2019, His PSA is up a bit more. THE MRIP was negative earlier this year and he had a negative biopsy in 2015. I will repeat a PSA in 3 months with a week of abstinence. , - 2018 (Worsening), His PSA is up again with a low f/t ratio. I am going to get an MRI and will do a fusion biopsy if positive. If negative, he will return in 6 months with a PSA. , - 2018 (Stable), His PSA has come down from the 4 level. His PSADT is about 4 years. , - 2017, Elevated prostate specific antigen (PSA), - 5093 Renal colic - 2671 Ureteral calculus - 2021 Other microscopic hematuria, Microscopic hematuria - 2016 History of bladder cancer, Bladder Cancer - 2014      PMH Notes:  2008-06-06 12:27:08 - Note: Nephrolithiasis Of The Left Kidney   DM I   NON-GU PMH: Tinea cruris, HE has a new complaint of a groin rash that is probably candidal with his history of diabetes. Nystop sent to the pharmacy. - 2018 Encounter for general adult medical examination without abnormal findings, Encounter for preventive health examination - 2017 Personal history of other diseases of the circulatory system, History of hypertension - 2014 Personal history of other endocrine, nutritional and metabolic disease, History of type 2 diabetes mellitus - 2014, History of hypercholesterolemia, - 2014    FAMILY HISTORY: Atrial Fibrillation - Father Cardiac Failure - Father Family Health Status Number - Runs In Family    Notes: CaP father    SOCIAL HISTORY: Marital Status: Married Preferred Language: English; Race: Black or African American Current  Smoking Status: Patient smokes. Has smoked since 03/18/1986. Smokes 1/2 pack per day.  Does not use smokeless tobacco. Social Drinker.  Does not use drugs. Drinks 2 caffeinated drinks per day. Has not had a blood transfusion. Patient's occupation is/was Retired.     Notes: ETOH maybe once per week  trying to quit smoking    REVIEW OF SYSTEMS:    GU Review Male:   Patient denies frequent urination, hard to postpone urination, burning/  pain with urination, get up at night to urinate, leakage of urine, stream starts and stops, trouble starting your stream, have to strain to urinate , erection problems, and penile pain.  Gastrointestinal (Upper):   Patient denies nausea, vomiting, and indigestion/ heartburn.  Gastrointestinal (Lower):   Patient denies diarrhea and constipation.  Constitutional:   Patient denies fever, night sweats, weight loss, and fatigue.  Skin:   Patient denies skin rash/ lesion and itching.  Eyes:   Patient denies blurred vision and double vision.  Ears/ Nose/ Throat:   Patient denies sore throat and sinus problems.  Hematologic/Lymphatic:   Patient denies swollen glands and easy bruising.  Cardiovascular:   Patient denies leg swelling and chest pains.  Respiratory:   Patient denies cough and shortness of breath.  Endocrine:   Patient denies excessive thirst.  Musculoskeletal:   Patient denies back pain and joint pain.  Neurological:   Patient denies headaches and dizziness.  Psychologic:   Patient denies depression and anxiety.   VITAL SIGNS: None   MULTI-SYSTEM PHYSICAL EXAMINATION:    Gastrointestinal: No mass, no tenderness, no rigidity, non obese abdomen.     Complexity of Data:  Source Of History:  Patient  Records Review:   Previous Doctor Records, Previous Patient Records  Urine Test Review:   Urinalysis  X-Ray Review: C.T. Abdomen/Pelvis: Reviewed Films. Reviewed Report. Discussed With Patient.     05/31/21 03/30/20 01/14/20 08/21/19 02/27/19 10/15/18  06/22/18 12/21/17  PSA  Total PSA 0.89 ng/mL 5.98 ng/mL 5.66 ng/mL 6.08 ng/mL 4.89 ng/mL 5.32 ng/mL 4.14 ng/mL 3.63 ng/mL  Free PSA  0.30 ng/mL 0.29 ng/mL    0.32 ng/mL   % Free PSA  5 % PSA 5 % PSA    8 % PSA     08/23/06 09/09/04  Hormones  Testosterone, Total 3.65  4.19     PROCEDURES:         KUB - 74018  A single view of the abdomen is obtained.  Faint radiopacity consistent with known ureteral calculus.      Patient confirmed No Neulasta OnPro Device.           Urinalysis w/Scope Dipstick Dipstick Cont'd Micro  Color: Yellow Bilirubin: Neg mg/dL WBC/hpf: NS (Not Seen)  Appearance: Clear Ketones: Trace mg/dL RBC/hpf: 3 - 10/hpf  Specific Gravity: 1.025 Blood: 1+ ery/uL Bacteria: NS (Not Seen)  pH: 5.5 Protein: Trace mg/dL Cystals: NS (Not Seen)  Glucose: 1+ mg/dL Urobilinogen: 0.2 mg/dL Casts: NS (Not Seen)    Nitrites: Neg Trichomonas: Not Present    Leukocyte Esterase: Neg leu/uL Mucous: Not Present      Epithelial Cells: 0 - 5/hpf      Yeast: NS (Not Seen)      Sperm: Not Present         Morphine 4mg  - 32992, [id number] Qty: 4 Adm. By: Jairo Ben Dezantil  Unit: mg Lot No [id number]  Route: IM Exp. Date 10/17/2022  Freq: None Mfgr.:   Site: Right Hip   ASSESSMENT:      ICD-10 Details  1 GU:   Ureteral calculus - N20.1 Undiagnosed New Problem  2   Ureteral obstruction secondary to calculous - N13.2 Undiagnosed New Problem   PLAN:           Orders X-Rays: KUB          Schedule Procedure: Unspecified Date - Ureteroscopic laser litho - 29798, left          Document Letter(s):  Created for Patient:  Clinical Summary         Notes:   Given his continued pain and discomfort as well as his elevated creatinine of 2 and leukocytosis of 15, we will plan to proceed with cystoscopy with left ureteral stent placement. Possible that we may perform left ureteroscopy with laser lithotripsy and ureteral stent placement since urinalysis was negative for infection. He may need a  staged procedure.   CC: Dr. Ronnald Ramp  Dr. Jeffie Pollock        Next Appointment:      Next Appointment: 12/01/2021 10:00 AM    Appointment Type: Laboratory Appointment    Location: [facility] [phone]    Provider: Lab LAB    Reason for Visit: 6 mo psa/hypercalciura      Signed by Link Snuffer, III, M.D. on 07/13/21 at 2:48 PM (EST

## 2021-07-13 NOTE — Discharge Instructions (Addendum)

## 2021-07-14 ENCOUNTER — Encounter (HOSPITAL_COMMUNITY): Payer: Self-pay | Admitting: Urology

## 2021-07-23 DIAGNOSIS — N200 Calculus of kidney: Secondary | ICD-10-CM | POA: Diagnosis not present

## 2021-07-24 ENCOUNTER — Other Ambulatory Visit: Payer: Self-pay | Admitting: Internal Medicine

## 2021-07-24 DIAGNOSIS — J301 Allergic rhinitis due to pollen: Secondary | ICD-10-CM

## 2021-07-24 DIAGNOSIS — B354 Tinea corporis: Secondary | ICD-10-CM

## 2021-07-24 DIAGNOSIS — I10 Essential (primary) hypertension: Secondary | ICD-10-CM

## 2021-07-24 DIAGNOSIS — E139 Other specified diabetes mellitus without complications: Secondary | ICD-10-CM

## 2021-07-24 DIAGNOSIS — E785 Hyperlipidemia, unspecified: Secondary | ICD-10-CM

## 2021-07-30 ENCOUNTER — Ambulatory Visit (HOSPITAL_BASED_OUTPATIENT_CLINIC_OR_DEPARTMENT_OTHER): Payer: Medicare Other | Attending: Neurosurgery | Admitting: Physical Therapy

## 2021-07-30 ENCOUNTER — Encounter (HOSPITAL_BASED_OUTPATIENT_CLINIC_OR_DEPARTMENT_OTHER): Payer: Self-pay | Admitting: Physical Therapy

## 2021-07-30 ENCOUNTER — Other Ambulatory Visit: Payer: Self-pay

## 2021-07-30 DIAGNOSIS — F17210 Nicotine dependence, cigarettes, uncomplicated: Secondary | ICD-10-CM | POA: Diagnosis not present

## 2021-07-30 DIAGNOSIS — M6281 Muscle weakness (generalized): Secondary | ICD-10-CM | POA: Diagnosis not present

## 2021-07-30 DIAGNOSIS — M542 Cervicalgia: Secondary | ICD-10-CM | POA: Insufficient documentation

## 2021-07-30 NOTE — Therapy (Signed)
OUTPATIENT PHYSICAL THERAPY CERVICAL EVALUATION   Patient Name: Anthony Daniel. MRN: [id number] DOB:08-05-1950, 71 y.o., male Today's Date: 07/30/2021   PT End of Session - 07/30/21 1021     Visit Number 1    Number of Visits 18    Date for PT Re-Evaluation 10/28/21    Authorization Type Medicare    PT Start Time 5732    PT Stop Time 1100    PT Time Calculation (min) 45 min    Activity Tolerance Patient tolerated treatment well;Patient limited by pain    Behavior During Therapy WFL for tasks assessed/performed             Past Medical History:  Diagnosis Date   Arthritis    Back   Benign localized prostatic hyperplasia with lower urinary tract symptoms (LUTS)    CKD (chronic kidney disease), stage II    History of bladder cancer    1998--  TCC   History of kidney stones    managed by urologist--- dr Jeffie Pollock, dr prerminger @[email or web address] , and dr [email or web address]    Hyperlipidemia    Hypertension    Insulin pump in place    02-02-2021 per pt currently not using pump, doing lantus insulin bid as directed by dr Buddy Duty   LADA (latent autoimmune diabetes in adults), managed as type 1 [facility])    endocrinologist-- dr Buddy Duty   PONV (postoperative nausea and vomiting)    Prostate cancer [facility])    urologist--- dr Jeffie Pollock--  first dx 02/ 2020 gleason 3+3, active surveillance;  bx 09-23-2020  Gleason 4+4, PSA 7.64   Spinal stenosis, cervical region    Uses self-applied continuous glucose monitoring device    DEX-COM   Past Surgical History:  Procedure Laterality Date   ANTERIOR CERVICAL DECOMP/DISCECTOMY FUSION N/A 12/15/2020   Procedure: Cervical Three-Four Cervical Four-Five Anterior cervical decompression/discectomy/fusion;  Surgeon: Erline Levine, MD;  Location: [place];  Service: Neurosurgery;  Laterality: N/A;   COLONOSCOPY  2018   x2   CYSTOSCOPY  02/09/2021   Procedure: CYSTOSCOPY FLEXIBLE;  Surgeon: Irine Seal, MD;  Location: [facility];  Service: Urology;;    CYSTOSCOPY W/ URETERAL STENT PLACEMENT Right 05/12/2015   Procedure: CYSTOSCOPY WITH STENT REPLACEMENT;  Surgeon: Cleon Gustin, MD;  Location: [facility];  Service: Urology;  Laterality: Right;   CYSTOSCOPY WITH RETROGRADE PYELOGRAM, URETEROSCOPY AND STENT PLACEMENT Right 01/23/2019   Procedure: CYSTOSCOPY WITH RIGHT RETROGRADE PYELOGRAM, URETEROSCOPY AND STENT PLACEMENT;  Surgeon: Irine Seal, MD;  Location: [facility];  Service: Urology;  Laterality: Right;   CYSTOSCOPY WITH URETEROSCOPY AND STENT PLACEMENT Right 04/23/2015   Procedure: CYSTOSCOPY WITH URETEROSCOPY RETROGRADE PYELOGRAM AND STENT PLACEMENT;  Surgeon: Cleon Gustin, MD;  Location: [facility];  Service: Urology;  Laterality: Right;   CYSTOSCOPY/RETROGRADE/URETEROSCOPY/STONE EXTRACTION WITH BASKET Right 05/12/2015   Procedure: CYSTOSCOPY/RETROGRADE/URETEROSCOPY/STONE EXTRACTION WITH BASKET;  Surgeon: Cleon Gustin, MD;  Location: [facility];  Service: Urology;  Laterality: Right;   CYSTOSCOPY/URETEROSCOPY/HOLMIUM LASER/STENT PLACEMENT Left 09/18/2018   Procedure: CYSTOSCOPY/ LEFT RETROGRADE LEFT URETEROSCOPY [place] LASER/STENT PLACEMENT;  Surgeon: Irine Seal, MD;  Location: [facility];  Service: Urology;  Laterality: Left;   CYSTOSCOPY/URETEROSCOPY/HOLMIUM LASER/STENT PLACEMENT Right 10/24/2019   Procedure: CYSTOSCOPY/URETEROSCOPY/HOLMIUM LASER/STENT PLACEMENT;  Surgeon: Irine Seal, MD;  Location: [facility];  Service: Urology;  Laterality: Right;   CYSTOSCOPY/URETEROSCOPY/HOLMIUM LASER/STENT PLACEMENT Left 01/30/2020   @[facility]    CYSTOSCOPY/URETEROSCOPY/HOLMIUM LASER/STENT PLACEMENT Left 07/13/2021   Procedure: CYSTOSCOPY/URETEROSCOPY/HOLMIUM LASER/STENT PLACEMENT;  Surgeon: Marton Redwood  III, MD;  Location: [facility];  Service: Urology;  Laterality: Left;   EXTRACORPOREAL SHOCK WAVE LITHOTRIPSY  x2 in  2006//   x2  in 2012   HOLMIUM LASER APPLICATION Right 24/58/0998   Procedure:  HOLMIUM LASER APPLICATION;  Surgeon: Cleon Gustin, MD;  Location: [facility];  Service: Urology;  Laterality: Right;   HOLMIUM LASER APPLICATION Right 33/82/5053   Procedure: HOLMIUM LASER APPLICATION;  Surgeon: Irine Seal, MD;  Location: [facility];  Service: Urology;  Laterality: Right;   IR URETERAL STENT RIGHT NEW ACCESS W/O SEP NEPHROSTOMY CATH  11/25/2019   LAPAROSCOPIC APPENDECTOMY N/A 11/07/2012   Procedure: APPENDECTOMY LAPAROSCOPIC;  Surgeon: Harl Bowie, MD;  Location: [place];  Service: General;  Laterality: N/A;   NEPHROLITHOTOMY Left 08/06/2015   Procedure: 1ST STAGE  LEFT PERCUTANEOUS NEPHROLITHOTOMY ;  Surgeon: Irine Seal, MD;  Location: [facility];  Service: Urology;  Laterality: Left;   NEPHROLITHOTOMY Right 11/26/2019   Procedure: NEPHROLITHOTOMY PERCUTANEOUS;  Surgeon: Irine Seal, MD;  Location: [facility];  Service: Urology;  Laterality: Right;   NEPHROLITHOTOMY Right 11/28/2019   Procedure: NEPHROLITHOTOMY PERCUTANEOUS SECOND LOOK;  Surgeon: Alexis Frock, MD;  Location[facility];  Service: Urology;  Laterality: Right;   RADIOACTIVE SEED IMPLANT N/A 02/09/2021   Procedure: RADIOACTIVE SEED IMPLANT/BRACHYTHERAPY IMPLANT;  Surgeon: Irine Seal, MD;  Location: [facility];  Service: Urology;  Laterality: N/A;   SPACE OAR INSTILLATION N/A 02/09/2021   Procedure: SPACE OAR INSTILLATION;  Surgeon: Irine Seal, MD;  Location: [facility];  Service: Urology;  Laterality: N/A;   TONSILLECTOMY  1975   TRANSURETHRAL RESECTION OF BLADDER TUMOR  1998   Patient Active Problem List   Diagnosis Date Noted   Cervical stenosis of spinal canal 12/15/2020   Acute otitis media 12/04/2020   Cervical stenosis of spine 10/06/2020   Weakness of right lower extremity 09/11/2020   Radiculitis of right cervical region 09/11/2020   Weakness of right upper extremity 09/11/2020   Ataxia 09/11/2020   Right kidney stone 11/26/2019   Diuretic-induced  hypokalemia 08/29/2019   Prostate cancer ([place]) 09/06/2018   Tobacco abuse 07/17/2018   Essential hypertension 03/07/2018   Seasonal allergic rhinitis due to pollen 09/26/2017   Spinal stenosis in cervical region 12/01/2015   Sleep apnea in adult 06/24/2015   DDD (degenerative disc disease), cervical 11/10/2014   Background diabetic retinopathy ([place]) 08/05/2013   Hyperlipidemia with target LDL less than 100 11/05/2012   Routine general medical examination at a health care facility 06/26/2012   LADA (latent autoimmune diabetes in adults), managed as type 1 ([place]) 06/26/2012    PCP: Janith Lima, MD  REFERRING PROVIDER: Marvis Moeller, NP  REFERRING DIAG: M54.2 (ICD-10-CM) - Cervicalgia  THERAPY DIAG:  Cervicalgia - Plan: PT plan of care cert/re-cert  Muscle weakness (generalized) - Plan: PT plan of care cert/re-cert  ONSET DATE: 9767  SUBJECTIVE:  SUBJECTIVE STATEMENT: Pt states this issue has been going on for at least 2-3 years. Pt states the R arm and shoulder were painful. He started having UE weakness. Pt is R hand dominant and noticed the L started becoming stronger. Pt states the surgery is helping. Pt states no longer has NT into the hands.   Pt does feel neck stress like the "muscle is constantly being stressed." Pt notices that the neck after surgery. At rest, pt has more strain than neck pain. "It feels hard to keep his head up." Driving long distances, the neck becomes tired. Pt states his biggest deficit is stiffness and inability to turn and move. He also feels some difficulty with balance.  Pt denies HA, dizziness, nausea.  Pt denies recent systemic symptoms.  Pt does currently smoke 12 in a day.  PERTINENT HISTORY:  Cervical fusion C3-C5- May/July 2022, DDD, HTN,  HLD  PAIN:  Are you having pain? No NPRS scale: 0/10   PRECAUTIONS: None  WEIGHT BEARING RESTRICTIONS No  FALLS:  Has patient fallen in last 6 months? No Number of falls: 0  LIVING ENVIRONMENT: Lives with: lives with their family Lives in: House/apartment   OCCUPATION: retired  PLOF: Independent  PATIENT GOALS : Pt would like strengthen the neck and upper quarter. Pt would like to also improve his balance.   OBJECTIVE:   DIAGNOSTIC FINDINGS:  FINDINGS: 1st cross-table lateral view demonstrates anterior localizing instrument directed at the C4-5 level.   Second lateral intraoperative image demonstrates 2 level anterior fusion from C3-C5. C5 vertebral body not well visualized due to overlying shoulders.   IMPRESSION: Intraoperative imaging as above.  PATIENT SURVEYS:  FOTO [id number] at D/C   COGNITION: Overall cognitive status: Within functional limits for tasks assessed   SENSATION: Light touch: Appears intact  POSTURE:  Fwd head, rounded shoulders, mod kyphosis  PALPATION: Hypertonicity of bilat UT, C/S paraspinals, levator   CERVICAL AROM/PROM  A/PROM A/PROM (deg) 07/30/2021  Flexion 75%  Extension 60%  Right lateral flexion 50%  Left lateral flexion 50%  Right rotation 50%  Left rotation 60%   (Blank rows = not tested)  UE AROM/PROM:  A/PROM Right 07/30/2021 Left 07/30/2021  Shoulder flexion 110 110  Shoulder extension 120 130  Shoulder abduction 120 130  Shoulder internal rotation L1 SIJ  Shoulder external rotation T1 C7   (Blank rows = not tested)  UE MMT:  MMT Right 07/30/2021 Left 07/30/2021  Shoulder flexion 4/5 4/5  Shoulder extension 4+/5 4+/5  Shoulder abduction 4/5 4/5  Shoulder IR 4+/5 4+/5  Shoulder ER 4/5 4/5   (Blank rows = not tested)   BALANCE: EO NBOS 10s, EC NBOS 10s, Tandem increased postural sway and ankle strategy; difficulty with T/S rotation and cervical rotation during pivots and turns- postural sway with  gait deviation   TODAY'S TREATMENT:    Exercises Seated Thoracic Lumbar Extension - 1 x daily - 7 x weekly - 2 sets - 10 reps - 5 hold Gentle Levator Scapulae Stretch - 2 x daily - 7 x weekly - 1 sets - 3 reps - 30 hold Seated Cervical Retraction - 2 x daily - 7 x weekly - 2 sets - 10 reps    PATIENT EDUCATION:  Education details: MOI, thermotherapy, diagnosis, prognosis, anatomy, exercise progression, DOMS expectations, muscle firing,  envelope of function, HEP, POC  Person educated: Patient Education method: Explanation, Demonstration, Tactile cues, Verbal cues, and Handouts Education comprehension: verbalized understanding, returned demonstration, verbal cues required,  and tactile cues required   HOME EXERCISE PROGRAM: Access Code: [email or web address] URL: [email or web address] Date: 07/30/2021 Prepared by: Daleen Bo  ASSESSMENT:  CLINICAL IMPRESSION: Patient is a 71 y.o. male who was seen today for physical therapy evaluation and treatment for cc of neck pain.  Pt's s/s appear consistent with joint stiffness and limited upper quarter ROM to be expected post-op. Pt has postural and scapular strength deficits contributing to current limitations. Pt's is also stiff in T/S which may cause more demand on C/S ROM. Pain is minimally irritable and sensitive. Pt does have balance deficits with dynamic movements likely related to C/S proprioceptive limitations.   Objective impairments include decreased ROM, decreased strength, hypomobility, increased fascial restrictions, increased muscle spasms, impaired flexibility, impaired UE functional use, improper body mechanics, postural dysfunction, and pain. These impairments are limiting patient from cleaning, community activity, driving, meal prep, laundry, yard work, shopping, and exercise/recreation . Personal factors including Age, Behavior pattern, Past/current experiences, Time since onset of injury/illness/exacerbation, and 1-2  comorbidities:    are also affecting patient's functional outcome. Patient will benefit from skilled PT to address above impairments and improve overall function.  REHAB POTENTIAL: Fair    CLINICAL DECISION MAKING: Evolving/moderate complexity  EVALUATION COMPLEXITY: Moderate   GOALS:   SHORT TERM GOALS:  STG Name Target Date Goal status  1 Pt will become independent with HEP in order to demonstrate synthesis of PT education.  09/10/2021 INITIAL  2 Pt will be able to demonstrate OH ROM and reach in order to demonstrate functional improvement in UE function for self-care and house hold duties.  09/10/2021 INITIAL  3 Pt will be able to demonstrate at least 20% increase in cervical rotation in order to demonstrate functional improvement in C/S function for self-care in driving tasks.   09/10/2021 INITIAL   LONG TERM GOALS:   LTG Name Target Date Goal status  1 Pt  will become independent with final HEP in order to demonstrate synthesis of PT education.  10/22/2021 INITIAL  2 Pt will be able to reach [facility] and carry/hold >5 lbs in order to demonstrate functional improvement in UE strength for return to PLOF and exercise.   10/22/2021 INITIAL  3 Pt will be able to demonstrate ability to stand/turn/pivot without LOB in order to demonstrate functional improvement in balance function for community and household mobility.  10/22/2021 INITIAL  4 Pt will score >/= 58 on FOTO to demonstrate improvement in perceived cervical function.  10/22/2021 INITIAL   PLAN: PT FREQUENCY: 1-2x/week  PT DURATION: 12 weeks (likely d/c by 8)  PLANNED INTERVENTIONS: Therapeutic exercises, Therapeutic activity, Neuro Muscular re-education, Balance training, Gait training, Patient/Family education, and Joint mobilization  PLAN FOR NEXT SESSION: review HEP, STM, shoulder ROM and strength  Daleen Bo PT, DPT 07/30/21 12:29 PM

## 2021-08-09 ENCOUNTER — Ambulatory Visit (HOSPITAL_BASED_OUTPATIENT_CLINIC_OR_DEPARTMENT_OTHER): Payer: Medicare Other | Admitting: Physical Therapy

## 2021-08-09 ENCOUNTER — Other Ambulatory Visit: Payer: Self-pay

## 2021-08-09 ENCOUNTER — Encounter (HOSPITAL_BASED_OUTPATIENT_CLINIC_OR_DEPARTMENT_OTHER): Payer: Self-pay | Admitting: Physical Therapy

## 2021-08-09 DIAGNOSIS — M6281 Muscle weakness (generalized): Secondary | ICD-10-CM | POA: Diagnosis not present

## 2021-08-09 DIAGNOSIS — M542 Cervicalgia: Secondary | ICD-10-CM | POA: Diagnosis not present

## 2021-08-09 DIAGNOSIS — F17210 Nicotine dependence, cigarettes, uncomplicated: Secondary | ICD-10-CM | POA: Diagnosis not present

## 2021-08-09 NOTE — Therapy (Signed)
OUTPATIENT PHYSICAL THERAPY CERVICAL EVALUATION   Patient Name: Anthony Daniel. MRN: [id number] DOB:08-31-1950, 71 y.o., male Today's Date: 08/09/2021   PT End of Session - 08/09/21 1043     Visit Number 2    Number of Visits 18    Date for PT Re-Evaluation 10/28/21    Authorization Type Medicare    PT Start Time 5009    PT Stop Time 1055    PT Time Calculation (min) 40 min    Activity Tolerance Patient tolerated treatment well;Patient limited by pain    Behavior During Therapy WFL for tasks assessed/performed              Past Medical History:  Diagnosis Date   Arthritis    Back   Benign localized prostatic hyperplasia with lower urinary tract symptoms (LUTS)    CKD (chronic kidney disease), stage II    History of bladder cancer    1998--  TCC   History of kidney stones    managed by urologist--- dr Jeffie Pollock, dr prerminger @[facility] , and dr gutierrez-aceves [email or web address]    Hyperlipidemia    Hypertension    Insulin pump in place    02-02-2021 per pt currently not using pump, doing lantus insulin bid as directed by dr Buddy Duty   LADA (latent autoimmune diabetes in adults), managed as type 1 [facility])    endocrinologist-- dr Buddy Duty   PONV (postoperative nausea and vomiting)    Prostate cancer [facility])    urologist--- dr Jeffie Pollock--  first dx 02/ 2020 gleason 3+3, active surveillance;  bx 09-23-2020  Gleason 4+4, PSA 7.64   Spinal stenosis, cervical region    Uses self-applied continuous glucose monitoring device    DEX-COM   Past Surgical History:  Procedure Laterality Date   ANTERIOR CERVICAL DECOMP/DISCECTOMY FUSION N/A 12/15/2020   Procedure: Cervical Three-Four Cervical Four-Five Anterior cervical decompression/discectomy/fusion;  Surgeon: Erline Levine, MD;  Location: [place];  Service: Neurosurgery;  Laterality: N/A;   COLONOSCOPY  2018   x2   CYSTOSCOPY  02/09/2021   Procedure: CYSTOSCOPY FLEXIBLE;  Surgeon: Irine Seal, MD;  [facility];  Service: Urology;;    CYSTOSCOPY W/ URETERAL STENT PLACEMENT Right 05/12/2015   Procedure: CYSTOSCOPY WITH STENT REPLACEMENT;  Surgeon: Cleon Gustin, MD;  Location: [facility];  Service: Urology;  Laterality: Right;   CYSTOSCOPY WITH RETROGRADE PYELOGRAM, URETEROSCOPY AND STENT PLACEMENT Right 01/23/2019   Procedure: CYSTOSCOPY WITH RIGHT RETROGRADE PYELOGRAM, URETEROSCOPY AND STENT PLACEMENT;  Surgeon: Irine Seal, MD;  [facility];  Service: Urology;  Laterality: Right;   CYSTOSCOPY WITH URETEROSCOPY AND STENT PLACEMENT Right 04/23/2015   Procedure: CYSTOSCOPY WITH URETEROSCOPY RETROGRADE PYELOGRAM AND STENT PLACEMENT;  Surgeon: Cleon Gustin, MD;  [facility];  Service: Urology;  Laterality: Right;   CYSTOSCOPY/RETROGRADE/URETEROSCOPY/STONE EXTRACTION WITH BASKET Right 05/12/2015   Procedure: CYSTOSCOPY/RETROGRADE/URETEROSCOPY/STONE EXTRACTION WITH BASKET;  Surgeon: Cleon Gustin, MD;  Location: [facility];  Service: Urology;  Laterality: Right;   CYSTOSCOPY/URETEROSCOPY/HOLMIUM LASER/STENT PLACEMENT Left 09/18/2018   Procedure: CYSTOSCOPY/ LEFT RETROGRADE LEFT URETEROSCOPY [place] LASER/STENT PLACEMENT;  Surgeon: Irine Seal, MD;  Location: [facility];  Service: Urology;  Laterality: Left;   CYSTOSCOPY/URETEROSCOPY/HOLMIUM LASER/STENT PLACEMENT Right 10/24/2019   Procedure: CYSTOSCOPY/URETEROSCOPY/HOLMIUM LASER/STENT PLACEMENT;  Surgeon: Irine Seal, MD;  [facility];  Service: Urology;  Laterality: Right;   CYSTOSCOPY/URETEROSCOPY/HOLMIUM LASER/STENT PLACEMENT Left 01/30/2020   @[facility]    CYSTOSCOPY/URETEROSCOPY/HOLMIUM LASER/STENT PLACEMENT Left 07/13/2021   Procedure: CYSTOSCOPY/URETEROSCOPY/HOLMIUM LASER/STENT PLACEMENT;  Surgeon: Link Snuffer  D III, MD;  [facility];  Service: Urology;  Laterality: Left;   EXTRACORPOREAL SHOCK WAVE LITHOTRIPSY  x2 in  2006//   x2  in 2012   HOLMIUM LASER APPLICATION Right 45/99/7741   Procedure:  HOLMIUM LASER APPLICATION;  Surgeon: Cleon Gustin, MD;  Location: [facility];  Service: Urology;  Laterality: Right;   HOLMIUM LASER APPLICATION Right 42/39/5320   Procedure: HOLMIUM LASER APPLICATION;  Surgeon: Irine Seal, MD;  [facility];  Service: Urology;  Laterality: Right;   IR URETERAL STENT RIGHT NEW ACCESS W/O SEP NEPHROSTOMY CATH  11/25/2019   LAPAROSCOPIC APPENDECTOMY N/A 11/07/2012   Procedure: APPENDECTOMY LAPAROSCOPIC;  Surgeon: Harl Bowie, MD;  Location: [place];  Service: General;  Laterality: N/A;   NEPHROLITHOTOMY Left 08/06/2015   Procedure: 1ST STAGE  LEFT PERCUTANEOUS NEPHROLITHOTOMY ;  Surgeon: Irine Seal, MD;  [facility];  Service: Urology;  Laterality: Left;   NEPHROLITHOTOMY Right 11/26/2019   Procedure: NEPHROLITHOTOMY PERCUTANEOUS;  Surgeon: Irine Seal, MD;  [facility];  Service: Urology;  Laterality: Right;   NEPHROLITHOTOMY Right 11/28/2019   Procedure: NEPHROLITHOTOMY PERCUTANEOUS SECOND LOOK;  Surgeon: Alexis Frock, MD;  [facility];  Service: Urology;  Laterality: Right;   RADIOACTIVE SEED IMPLANT N/A 02/09/2021   Procedure: RADIOACTIVE SEED IMPLANT/BRACHYTHERAPY IMPLANT;  Surgeon: Irine Seal, MD;  Location: [facility];  Service: Urology;  Laterality: N/A;   SPACE OAR INSTILLATION N/A 02/09/2021   Procedure: SPACE OAR INSTILLATION;  Surgeon: Irine Seal, MD;  Location: [facility];  Service: Urology;  Laterality: N/A;   TONSILLECTOMY  1975   TRANSURETHRAL RESECTION OF BLADDER TUMOR  1998   Patient Active Problem List   Diagnosis Date Noted   Cervical stenosis of spinal canal 12/15/2020   Acute otitis media 12/04/2020   Cervical stenosis of spine 10/06/2020   Weakness of right lower extremity 09/11/2020   Radiculitis of right cervical region 09/11/2020   Weakness of right upper extremity 09/11/2020   Ataxia 09/11/2020   Right kidney stone 11/26/2019   Diuretic-induced  hypokalemia 08/29/2019   Prostate cancer ([place]) 09/06/2018   Tobacco abuse 07/17/2018   Essential hypertension 03/07/2018   Seasonal allergic rhinitis due to pollen 09/26/2017   Spinal stenosis in cervical region 12/01/2015   Sleep apnea in adult 06/24/2015   DDD (degenerative disc disease), cervical 11/10/2014   Background diabetic retinopathy ([place]) 08/05/2013   Hyperlipidemia with target LDL less than 100 11/05/2012   Routine general medical examination at a health care facility 06/26/2012   LADA (latent autoimmune diabetes in adults), managed as type 1 ([place]) 06/26/2012    PCP: Janith Lima, MD  REFERRING PROVIDER: Janith Lima, MD  REFERRING DIAG: M54.2 (ICD-10-CM) - Cervicalgia  THERAPY DIAG:  Cervicalgia  Muscle weakness (generalized)  ONSET DATE: 2021  SUBJECTIVE:  SUBJECTIVE STATEMENT: Pt states no pain with HEP. Pt does not notice a difference yet. Pt still feels shoulder stiffness.   Pt does currently smoke 12 in a day.  PERTINENT HISTORY:  Cervical fusion C3-C5- May/July 2022, DDD, HTN, HLD  PAIN:  Are you having pain? No NPRS scale: 0/10   PRECAUTIONS: None  WEIGHT BEARING RESTRICTIONS No   PATIENT GOALS : Pt would like strengthen the neck and upper quarter. Pt would like to also improve his balance.   OBJECTIVE:   DIAGNOSTIC FINDINGS:  FINDINGS: 1st cross-table lateral view demonstrates anterior localizing instrument directed at the C4-5 level.   Second lateral intraoperative image demonstrates 2 level anterior fusion from C3-C5. C5 vertebral body not well visualized due to overlying shoulders.   IMPRESSION: Intraoperative imaging as above.  PATIENT SURVEYS:  FOTO 51 58 at D/C    TODAY'S TREATMENT:   Manual therapy: STM bilat UT    Exercises Seated Thoracic Lumbar Extension - 2 sets - 10 reps - 5 hold Gentle Levator Scapulae Stretch -  3 reps - 30 hold Seated Cervical Retraction - - 2 sets - 10 reps UT stretch for R 30s 3x Bilat shoulder ER YTB 2x10    PATIENT EDUCATION:  Education details: MOI, thermotherapy, diagnosis, prognosis, anatomy, exercise progression, DOMS expectations, muscle firing,  envelope of function, HEP, POC  Person educated: Patient Education method: Explanation, Demonstration, Tactile cues, Verbal cues, and Handouts Education comprehension: verbalized understanding, returned demonstration, verbal cues required, and tactile cues required   HOME EXERCISE PROGRAM: Access Code: [email or web address] URL: [email or web address] Date: 07/30/2021 Prepared by: Daleen Bo  ASSESSMENT:  CLINICAL IMPRESSION: Pt with report of decreased neck and bilat shoulder stiffness following STM of bilat UT. Pt was able to demonstrate good retention of HEP form and technique and was able to progress ROM exercise at this time without irritation. Pt does have closing pattern restriction on R side with combined rotation and SB. Plan to add in more UE and shoulder strength to HEP at next session if no increased neck stiffness from combined ER and retraction.  Objective impairments include decreased ROM, decreased strength, hypomobility, increased fascial restrictions, increased muscle spasms, impaired flexibility, impaired UE functional use, improper body mechanics, postural dysfunction, and pain. These impairments are limiting patient from cleaning, community activity, driving, meal prep, laundry, yard work, shopping, and exercise/recreation . Personal factors including Age, Behavior pattern, Past/current experiences, Time since onset of injury/illness/exacerbation, and 1-2 comorbidities:    are also affecting patient's functional outcome. Patient will benefit from skilled PT to address above impairments and improve  overall function.  REHAB POTENTIAL: Fair    CLINICAL DECISION MAKING: Evolving/moderate complexity  EVALUATION COMPLEXITY: Moderate   GOALS:   SHORT TERM GOALS:  STG Name Target Date Goal status  1 Pt will become independent with HEP in order to demonstrate synthesis of PT education.  09/20/2021 INITIAL  2 Pt will be able to demonstrate OH ROM and reach in order to demonstrate functional improvement in UE function for self-care and house hold duties.  09/20/2021 INITIAL  3 Pt will be able to demonstrate at least 20% increase in cervical rotation in order to demonstrate functional improvement in C/S function for self-care in driving tasks.   09/20/2021 INITIAL   LONG TERM GOALS:   LTG Name Target Date Goal status  1 Pt  will become independent with final HEP in order to demonstrate synthesis of PT education.  11/01/2021 INITIAL  2 Pt will be able to reach [facility] and  carry/hold >5 lbs in order to demonstrate functional improvement in UE strength for return to PLOF and exercise.   11/01/2021 INITIAL  3 Pt will be able to demonstrate ability to stand/turn/pivot without LOB in order to demonstrate functional improvement in balance function for community and household mobility.  11/01/2021 INITIAL  4 Pt will score >/= 58 on FOTO to demonstrate improvement in perceived cervical function.  11/01/2021 INITIAL   PLAN: PT FREQUENCY: 1-2x/week  PT DURATION: 12 weeks (likely d/c by 8)  PLANNED INTERVENTIONS: Therapeutic exercises, Therapeutic activity, Neuro Muscular re-education, Balance training, Gait training, Patient/Family education, and Joint mobilization  PLAN FOR NEXT SESSION: review HEP, STM, shoulder ROM and strength  Daleen Bo PT, DPT 08/09/21 10:58 AM

## 2021-08-12 DIAGNOSIS — N200 Calculus of kidney: Secondary | ICD-10-CM | POA: Diagnosis not present

## 2021-08-18 DIAGNOSIS — M545 Low back pain, unspecified: Secondary | ICD-10-CM | POA: Diagnosis not present

## 2021-08-18 DIAGNOSIS — M5030 Other cervical disc degeneration, unspecified cervical region: Secondary | ICD-10-CM | POA: Diagnosis not present

## 2021-08-18 DIAGNOSIS — Z6833 Body mass index (BMI) 33.0-33.9, adult: Secondary | ICD-10-CM | POA: Diagnosis not present

## 2021-08-18 DIAGNOSIS — M5416 Radiculopathy, lumbar region: Secondary | ICD-10-CM | POA: Diagnosis not present

## 2021-08-18 DIAGNOSIS — M5412 Radiculopathy, cervical region: Secondary | ICD-10-CM | POA: Diagnosis not present

## 2021-08-18 DIAGNOSIS — M542 Cervicalgia: Secondary | ICD-10-CM | POA: Diagnosis not present

## 2021-08-18 DIAGNOSIS — I10 Essential (primary) hypertension: Secondary | ICD-10-CM | POA: Diagnosis not present

## 2021-08-19 ENCOUNTER — Other Ambulatory Visit: Payer: Self-pay

## 2021-08-19 ENCOUNTER — Encounter (HOSPITAL_BASED_OUTPATIENT_CLINIC_OR_DEPARTMENT_OTHER): Payer: Self-pay | Admitting: Physical Therapy

## 2021-08-19 ENCOUNTER — Ambulatory Visit (HOSPITAL_BASED_OUTPATIENT_CLINIC_OR_DEPARTMENT_OTHER): Payer: Medicare Other | Attending: Neurosurgery | Admitting: Physical Therapy

## 2021-08-19 DIAGNOSIS — M6281 Muscle weakness (generalized): Secondary | ICD-10-CM | POA: Insufficient documentation

## 2021-08-19 DIAGNOSIS — M542 Cervicalgia: Secondary | ICD-10-CM | POA: Insufficient documentation

## 2021-08-19 NOTE — Therapy (Signed)
OUTPATIENT PHYSICAL THERAPY CERVICAL TREATMENT   Patient Name: Anthony Daniel. MRN: [id number] DOB:1950-12-25, 71 y.o., male Today's Date: 08/19/2021   PT End of Session - 08/19/21 0951     Visit Number 3    Number of Visits 18    Date for PT Re-Evaluation 10/28/21    Authorization Type Medicare    PT Start Time 0935    PT Stop Time 5621    PT Time Calculation (min) 40 min    Activity Tolerance Patient tolerated treatment well;Patient limited by pain    Behavior During Therapy [facility] for tasks assessed/performed               Past Medical History:  Diagnosis Date   Arthritis    Back   Benign localized prostatic hyperplasia with lower urinary tract symptoms (LUTS)    CKD (chronic kidney disease), stage II    History of bladder cancer    1998--  TCC   History of kidney stones    managed by urologist--- dr Jeffie Pollock, dr prerminger @[facility] , and dr gutierrez-aceves [email or web address]    Hyperlipidemia    Hypertension    Insulin pump in place    02-02-2021 per pt currently not using pump, doing lantus insulin bid as directed by dr Buddy Duty   LADA (latent autoimmune diabetes in adults), managed as type 1 [facility])    endocrinologist-- dr Buddy Duty   PONV (postoperative nausea and vomiting)    Prostate cancer [facility])    urologist--- dr Jeffie Pollock--  first dx 02/ 2020 gleason 3+3, active surveillance;  bx 09-23-2020  Gleason 4+4, PSA 7.64   Spinal stenosis, cervical region    Uses self-applied continuous glucose monitoring device    DEX-COM   Past Surgical History:  Procedure Laterality Date   ANTERIOR CERVICAL DECOMP/DISCECTOMY FUSION N/A 12/15/2020   Procedure: Cervical Three-Four Cervical Four-Five Anterior cervical decompression/discectomy/fusion;  Surgeon: Erline Levine, MD;  Location: [place];  Service: Neurosurgery;  Laterality: N/A;   COLONOSCOPY  2018   x2   CYSTOSCOPY  02/09/2021   Procedure: CYSTOSCOPY FLEXIBLE;  Surgeon: Irine Seal, MD;  Location: [facility];  Service: Urology;;    CYSTOSCOPY W/ URETERAL STENT PLACEMENT Right 05/12/2015   Procedure: CYSTOSCOPY WITH STENT REPLACEMENT;  Surgeon: Cleon Gustin, MD;  Location: [facility];  Service: Urology;  Laterality: Right;   CYSTOSCOPY WITH RETROGRADE PYELOGRAM, URETEROSCOPY AND STENT PLACEMENT Right 01/23/2019   Procedure: CYSTOSCOPY WITH RIGHT RETROGRADE PYELOGRAM, URETEROSCOPY AND STENT PLACEMENT;  Surgeon: Irine Seal, MD;  [facility];  Service: Urology;  Laterality: Right;   CYSTOSCOPY WITH URETEROSCOPY AND STENT PLACEMENT Right 04/23/2015   Procedure: CYSTOSCOPY WITH URETEROSCOPY RETROGRADE PYELOGRAM AND STENT PLACEMENT;  Surgeon: Cleon Gustin, MD;  [facility];  Service: Urology;  Laterality: Right;   CYSTOSCOPY/RETROGRADE/URETEROSCOPY/STONE EXTRACTION WITH BASKET Right 05/12/2015   Procedure: CYSTOSCOPY/RETROGRADE/URETEROSCOPY/STONE EXTRACTION WITH BASKET;  Surgeon: Cleon Gustin, MD;  Location[facility];  Service: Urology;  Laterality: Right;   CYSTOSCOPY/URETEROSCOPY/HOLMIUM LASER/STENT PLACEMENT Left 09/18/2018   Procedure: CYSTOSCOPY/ LEFT RETROGRADE LEFT URETEROSCOPY [place] LASER/STENT PLACEMENT;  Surgeon: Irine Seal, MD;  Location: [facility];  Service: Urology;  Laterality: Left;   CYSTOSCOPY/URETEROSCOPY/HOLMIUM LASER/STENT PLACEMENT Right 10/24/2019   Procedure: CYSTOSCOPY/URETEROSCOPY/HOLMIUM LASER/STENT PLACEMENT;  Surgeon: Irine Seal, MD;  [facility];  Service: Urology;  Laterality: Right;   CYSTOSCOPY/URETEROSCOPY/HOLMIUM LASER/STENT PLACEMENT Left 01/30/2020   @[facility]    CYSTOSCOPY/URETEROSCOPY/HOLMIUM LASER/STENT PLACEMENT Left 07/13/2021   Procedure: CYSTOSCOPY/URETEROSCOPY/HOLMIUM LASER/STENT PLACEMENT;  Surgeon: Gloriann Loan,  Desiree Hane, MD;  [facility];  Service: Urology;  Laterality: Left;   EXTRACORPOREAL SHOCK WAVE LITHOTRIPSY  x2 in  2006//   x2  in 2012   HOLMIUM LASER APPLICATION Right 62/09/5595   Procedure:  HOLMIUM LASER APPLICATION;  Surgeon: Cleon Gustin, MD;  Location: [facility];  Service: Urology;  Laterality: Right;   HOLMIUM LASER APPLICATION Right 41/63/8453   Procedure: HOLMIUM LASER APPLICATION;  Surgeon: Irine Seal, MD;  [facility];  Service: Urology;  Laterality: Right;   IR URETERAL STENT RIGHT NEW ACCESS W/O SEP NEPHROSTOMY CATH  11/25/2019   LAPAROSCOPIC APPENDECTOMY N/A 11/07/2012   Procedure: APPENDECTOMY LAPAROSCOPIC;  Surgeon: Harl Bowie, MD;  Location: [place];  Service: General;  Laterality: N/A;   NEPHROLITHOTOMY Left 08/06/2015   Procedure: 1ST STAGE  LEFT PERCUTANEOUS NEPHROLITHOTOMY ;  Surgeon: Irine Seal, MD;  [facility];  Service: Urology;  Laterality: Left;   NEPHROLITHOTOMY Right 11/26/2019   Procedure: NEPHROLITHOTOMY PERCUTANEOUS;  Surgeon: Irine Seal, MD;  [facility];  Service: Urology;  Laterality: Right;   NEPHROLITHOTOMY Right 11/28/2019   Procedure: NEPHROLITHOTOMY PERCUTANEOUS SECOND LOOK;  Surgeon: Alexis Frock, MD;  [facility];  Service: Urology;  Laterality: Right;   RADIOACTIVE SEED IMPLANT N/A 02/09/2021   Procedure: RADIOACTIVE SEED IMPLANT/BRACHYTHERAPY IMPLANT;  Surgeon: Irine Seal, MD;  Location: [facility];  Service: Urology;  Laterality: N/A;   SPACE OAR INSTILLATION N/A 02/09/2021   Procedure: SPACE OAR INSTILLATION;  Surgeon: Irine Seal, MD;  Location: [facility];  Service: Urology;  Laterality: N/A;   TONSILLECTOMY  1975   TRANSURETHRAL RESECTION OF BLADDER TUMOR  1998   Patient Active Problem List   Diagnosis Date Noted   Cervical stenosis of spinal canal 12/15/2020   Acute otitis media 12/04/2020   Cervical stenosis of spine 10/06/2020   Weakness of right lower extremity 09/11/2020   Radiculitis of right cervical region 09/11/2020   Weakness of right upper extremity 09/11/2020   Ataxia 09/11/2020   Right kidney stone 11/26/2019   Diuretic-induced  hypokalemia 08/29/2019   Prostate cancer ([place]) 09/06/2018   Tobacco abuse 07/17/2018   Essential hypertension 03/07/2018   Seasonal allergic rhinitis due to pollen 09/26/2017   Spinal stenosis in cervical region 12/01/2015   Sleep apnea in adult 06/24/2015   DDD (degenerative disc disease), cervical 11/10/2014   Background diabetic retinopathy ([place]) 08/05/2013   Hyperlipidemia with target LDL less than 100 11/05/2012   Routine general medical examination at a health care facility 06/26/2012   LADA (latent autoimmune diabetes in adults), managed as type 1 ([place]) 06/26/2012    PCP: Janith Lima, MD  REFERRING PROVIDER: Janith Lima, MD  REFERRING DIAG: M54.2 (ICD-10-CM) - Cervicalgia  THERAPY DIAG:  Cervicalgia  Muscle weakness (generalized)  ONSET DATE: 2021  SUBJECTIVE:  SUBJECTIVE STATEMENT: Pt states no pain after last session but there seems to be no significant change yet. Pt states the relief from last session did seem to last about 2 days.   PERTINENT HISTORY:  Cervical fusion C3-C5- May/July 2022, DDD, HTN, HLD  PAIN:  Are you having pain? Yes NPRS scale: 2/10 R UT, aching sore   PRECAUTIONS: None  WEIGHT BEARING RESTRICTIONS No   PATIENT GOALS : Pt would like strengthen the neck and upper quarter. Pt would like to also improve his balance.   OBJECTIVE:   DIAGNOSTIC FINDINGS:  FINDINGS: 1st cross-table lateral view demonstrates anterior localizing instrument directed at the C4-5 level.   Second lateral intraoperative image demonstrates 2 level anterior fusion from C3-C5. C5 vertebral body not well visualized due to overlying shoulders.   IMPRESSION: Intraoperative imaging as above.  PATIENT SURVEYS:  FOTO 51 58 at D/C    TODAY'S TREATMENT:    Manual therapy: STM bilat UT   Exercises  Seated Cervical Retraction - 2 sets - 10 reps UT stretch for R 30s 3x Bilat shoulder ER RTB 2x10 Standing row RTB 2x10 Standing shoulder ext RTB 2x10 Scap depression at table- arms at sides 2s 15x    PATIENT EDUCATION:  Education details: MOI, thermotherapy, diagnosis, prognosis, anatomy, exercise progression, DOMS expectations, muscle firing,  envelope of function, HEP, POC  Person educated: Patient Education method: Explanation, Demonstration, Tactile cues, Verbal cues, and Handouts Education comprehension: verbalized understanding, returned demonstration, verbal cues required, and tactile cues required   HOME EXERCISE PROGRAM: Access Code: [email or web address] URL: [email or web address] Date: 07/30/2021 Prepared by: Daleen Bo  ASSESSMENT:  CLINICAL IMPRESSION: Pt with reduced stiffness into soft tissue at today's session. Pt given self STM technique and given progression of upper quarter/postural strength exercise. Pt able to perform increased intensity of loading as well as volume without increasing neck pain. Pt does have UT compensation tendency without TC and VC. HEP updated at this time. Plan to continue with postural strength as needed. Pt may not need STM at future visits given recent improvement in soft tissue extensibility.   Objective impairments include decreased ROM, decreased strength, hypomobility, increased fascial restrictions, increased muscle spasms, impaired flexibility, impaired UE functional use, improper body mechanics, postural dysfunction, and pain. These impairments are limiting patient from cleaning, community activity, driving, meal prep, laundry, yard work, shopping, and exercise/recreation . Personal factors including Age, Behavior pattern, Past/current experiences, Time since onset of injury/illness/exacerbation, and 1-2 comorbidities:    are also affecting patient's functional outcome. Patient will  benefit from skilled PT to address above impairments and improve overall function.  REHAB POTENTIAL: Fair    CLINICAL DECISION MAKING: Evolving/moderate complexity  EVALUATION COMPLEXITY: Moderate   GOALS:   SHORT TERM GOALS:  STG Name Target Date Goal status  1 Pt will become independent with HEP in order to demonstrate synthesis of PT education.  09/30/2021 INITIAL  2 Pt will be able to demonstrate OH ROM and reach in order to demonstrate functional improvement in UE function for self-care and house hold duties.  09/30/2021 INITIAL  3 Pt will be able to demonstrate at least 20% increase in cervical rotation in order to demonstrate functional improvement in C/S function for self-care in driving tasks.   09/30/2021 INITIAL   LONG TERM GOALS:   LTG Name Target Date Goal status  1 Pt  will become independent with final HEP in order to demonstrate synthesis of PT education.  11/11/2021 INITIAL  2 Pt will be able to reach  OH and carry/hold >5 lbs in order to demonstrate functional improvement in UE strength for return to PLOF and exercise.   11/11/2021 INITIAL  3 Pt will be able to demonstrate ability to stand/turn/pivot without LOB in order to demonstrate functional improvement in balance function for community and household mobility.  11/11/2021 INITIAL  4 Pt will score >/= 58 on FOTO to demonstrate improvement in perceived cervical function.  11/11/2021 INITIAL   PLAN: PT FREQUENCY: 1-2x/week  PT DURATION: 12 weeks (likely d/c by 8)  PLANNED INTERVENTIONS: Therapeutic exercises, Therapeutic activity, Neuro Muscular re-education, Balance training, Gait training, Patient/Family education, and Joint mobilization  PLAN FOR NEXT SESSION: review HEP, STM, shoulder ROM and strength  Daleen Bo PT, DPT 08/19/21 10:16 AM

## 2021-08-23 DIAGNOSIS — N5201 Erectile dysfunction due to arterial insufficiency: Secondary | ICD-10-CM | POA: Diagnosis not present

## 2021-08-23 DIAGNOSIS — N200 Calculus of kidney: Secondary | ICD-10-CM | POA: Diagnosis not present

## 2021-08-23 DIAGNOSIS — N401 Enlarged prostate with lower urinary tract symptoms: Secondary | ICD-10-CM | POA: Diagnosis not present

## 2021-08-23 DIAGNOSIS — R3912 Poor urinary stream: Secondary | ICD-10-CM | POA: Diagnosis not present

## 2021-08-23 DIAGNOSIS — C61 Malignant neoplasm of prostate: Secondary | ICD-10-CM | POA: Diagnosis not present

## 2021-08-24 ENCOUNTER — Other Ambulatory Visit: Payer: Self-pay

## 2021-08-24 ENCOUNTER — Ambulatory Visit (HOSPITAL_BASED_OUTPATIENT_CLINIC_OR_DEPARTMENT_OTHER): Payer: Medicare Other | Admitting: Physical Therapy

## 2021-08-24 ENCOUNTER — Encounter (HOSPITAL_BASED_OUTPATIENT_CLINIC_OR_DEPARTMENT_OTHER): Payer: Self-pay | Admitting: Physical Therapy

## 2021-08-24 DIAGNOSIS — M6281 Muscle weakness (generalized): Secondary | ICD-10-CM | POA: Diagnosis not present

## 2021-08-24 DIAGNOSIS — M542 Cervicalgia: Secondary | ICD-10-CM

## 2021-08-24 NOTE — Therapy (Signed)
OUTPATIENT PHYSICAL THERAPY CERVICAL TREATMENT   Patient Name: Anthony Daniel. MRN: [id number] DOB:1950-08-12, 71 y.o., male Today's Date: 08/24/2021   PT End of Session - 08/24/21 1056     Visit Number 4    Number of Visits 18    Date for PT Re-Evaluation 10/28/21    Authorization Type Medicare    PT Start Time 9371    PT Stop Time 1055    PT Time Calculation (min) 40 min    Activity Tolerance Patient tolerated treatment well;Patient limited by pain    Behavior During Therapy WFL for tasks assessed/performed                Past Medical History:  Diagnosis Date   Arthritis    Back   Benign localized prostatic hyperplasia with lower urinary tract symptoms (LUTS)    CKD (chronic kidney disease), stage II    History of bladder cancer    1998--  TCC   History of kidney stones    managed by urologist--- dr Jeffie Pollock, dr prerminger @[facility] , and dr [email or web address]    Hyperlipidemia    Hypertension    Insulin pump in place    02-02-2021 per pt currently not using pump, doing lantus insulin bid as directed by dr Buddy Duty   LADA (latent autoimmune diabetes in adults), managed as type 1 [facility])    endocrinologist-- dr Buddy Duty   PONV (postoperative nausea and vomiting)    Prostate cancer [facility])    urologist--- dr Jeffie Pollock--  first dx 02/ 2020 gleason 3+3, active surveillance;  bx 09-23-2020  Gleason 4+4, PSA 7.64   Spinal stenosis, cervical region    Uses self-applied continuous glucose monitoring device    DEX-COM   Past Surgical History:  Procedure Laterality Date   ANTERIOR CERVICAL DECOMP/DISCECTOMY FUSION N/A 12/15/2020   Procedure: Cervical Three-Four Cervical Four-Five Anterior cervical decompression/discectomy/fusion;  Surgeon: Erline Levine, MD;  Location: [place];  Service: Neurosurgery;  Laterality: N/A;   COLONOSCOPY  2018   x2   CYSTOSCOPY  02/09/2021   Procedure: CYSTOSCOPY FLEXIBLE;  Surgeon: Irine Seal, MD;  Location: [facility];  Service: Urology;;    CYSTOSCOPY W/ URETERAL STENT PLACEMENT Right 05/12/2015   Procedure: CYSTOSCOPY WITH STENT REPLACEMENT;  Surgeon: Cleon Gustin, MD;  Location: [facility];  Service: Urology;  Laterality: Right;   CYSTOSCOPY WITH RETROGRADE PYELOGRAM, URETEROSCOPY AND STENT PLACEMENT Right 01/23/2019   Procedure: CYSTOSCOPY WITH RIGHT RETROGRADE PYELOGRAM, URETEROSCOPY AND STENT PLACEMENT;  Surgeon: Irine Seal, MD;  Location: [facility];  Service: Urology;  Laterality: Right;   CYSTOSCOPY WITH URETEROSCOPY AND STENT PLACEMENT Right 04/23/2015   Procedure: CYSTOSCOPY WITH URETEROSCOPY RETROGRADE PYELOGRAM AND STENT PLACEMENT;  Surgeon: Cleon Gustin, MD;  Location: [facility];  Service: Urology;  Laterality: Right;   CYSTOSCOPY/RETROGRADE/URETEROSCOPY/STONE EXTRACTION WITH BASKET Right 05/12/2015   Procedure: CYSTOSCOPY/RETROGRADE/URETEROSCOPY/STONE EXTRACTION WITH BASKET;  Surgeon: Cleon Gustin, MD;  Location: [facility];  Service: Urology;  Laterality: Right;   CYSTOSCOPY/URETEROSCOPY/HOLMIUM LASER/STENT PLACEMENT Left 09/18/2018   Procedure: CYSTOSCOPY/ LEFT RETROGRADE LEFT URETEROSCOPY Quintana LASER/STENT PLACEMENT;  Surgeon: Irine Seal, MD;  Location: [facility];  Service: Urology;  Laterality: Left;   CYSTOSCOPY/URETEROSCOPY/HOLMIUM LASER/STENT PLACEMENT Right 10/24/2019   Procedure: CYSTOSCOPY/URETEROSCOPY/HOLMIUM LASER/STENT PLACEMENT;  Surgeon: Irine Seal, MD;  Location: [facility];  Service: Urology;  Laterality: Right;   CYSTOSCOPY/URETEROSCOPY/HOLMIUM LASER/STENT PLACEMENT Left 01/30/2020   @[facility]    CYSTOSCOPY/URETEROSCOPY/HOLMIUM LASER/STENT PLACEMENT Left 07/13/2021   Procedure: CYSTOSCOPY/URETEROSCOPY/HOLMIUM LASER/STENT PLACEMENT;  Surgeon:  Lucas Mallow, MD;  Location: [facility];  Service: Urology;  Laterality: Left;   EXTRACORPOREAL SHOCK WAVE LITHOTRIPSY  x2 in  2006//   x2  in 2012   HOLMIUM LASER APPLICATION Right 32/35/5732    Procedure: HOLMIUM LASER APPLICATION;  Surgeon: Cleon Gustin, MD;  Location: [facility];  Service: Urology;  Laterality: Right;   HOLMIUM LASER APPLICATION Right 20/25/4270   Procedure: HOLMIUM LASER APPLICATION;  Surgeon: Irine Seal, MD;  Location: [facility];  Service: Urology;  Laterality: Right;   IR URETERAL STENT RIGHT NEW ACCESS W/O SEP NEPHROSTOMY CATH  11/25/2019   LAPAROSCOPIC APPENDECTOMY N/A 11/07/2012   Procedure: APPENDECTOMY LAPAROSCOPIC;  Surgeon: Harl Bowie, MD;  Location: [place];  Service: General;  Laterality: N/A;   NEPHROLITHOTOMY Left 08/06/2015   Procedure: 1ST STAGE  LEFT PERCUTANEOUS NEPHROLITHOTOMY ;  Surgeon: Irine Seal, MD;  Location: [facility];  Service: Urology;  Laterality: Left;   NEPHROLITHOTOMY Right 11/26/2019   Procedure: NEPHROLITHOTOMY PERCUTANEOUS;  Surgeon: Irine Seal, MD;  Location: [facility];  Service: Urology;  Laterality: Right;   NEPHROLITHOTOMY Right 11/28/2019   Procedure: NEPHROLITHOTOMY PERCUTANEOUS SECOND LOOK;  Surgeon: Alexis Frock, MD;  Location: [facility];  Service: Urology;  Laterality: Right;   RADIOACTIVE SEED IMPLANT N/A 02/09/2021   Procedure: RADIOACTIVE SEED IMPLANT/BRACHYTHERAPY IMPLANT;  Surgeon: Irine Seal, MD;  Location: [facility];  Service: Urology;  Laterality: N/A;   SPACE OAR INSTILLATION N/A 02/09/2021   Procedure: SPACE OAR INSTILLATION;  Surgeon: Irine Seal, MD;  Location: [facility];  Service: Urology;  Laterality: N/A;   TONSILLECTOMY  1975   TRANSURETHRAL RESECTION OF BLADDER TUMOR  1998   Patient Active Problem List   Diagnosis Date Noted   Cervical stenosis of spinal canal 12/15/2020   Acute otitis media 12/04/2020   Cervical stenosis of spine 10/06/2020   Weakness of right lower extremity 09/11/2020   Radiculitis of right cervical region 09/11/2020   Weakness of right upper extremity 09/11/2020   Ataxia 09/11/2020   Right kidney stone 11/26/2019    Diuretic-induced hypokalemia 08/29/2019   Prostate cancer ([place]) 09/06/2018   Tobacco abuse 07/17/2018   Essential hypertension 03/07/2018   Seasonal allergic rhinitis due to pollen 09/26/2017   Spinal stenosis in cervical region 12/01/2015   Sleep apnea in adult 06/24/2015   DDD (degenerative disc disease), cervical 11/10/2014   Background diabetic retinopathy ([place]) 08/05/2013   Hyperlipidemia with target LDL less than 100 11/05/2012   Routine general medical examination at a health care facility 06/26/2012   LADA (latent autoimmune diabetes in adults), managed as type 1 ([place]) 06/26/2012    PCP: Janith Lima, MD  REFERRING PROVIDER: Janith Lima, MD  REFERRING DIAG: M54.2 (ICD-10-CM) - Cervicalgia  THERAPY DIAG:  Cervicalgia  Muscle weakness (generalized)  ONSET DATE: 2021  SUBJECTIVE:  SUBJECTIVE STATEMENT: Pt states his neck pain is about the same. He did not have soreness or pain after last session and no issues with new HEP.   PERTINENT HISTORY:  Cervical fusion C3-C5- May/July 2022, DDD, HTN, HLD  PAIN:  Are you having pain? Yes NPRS scale: 2/10 R UT, aching sore   PRECAUTIONS: None  WEIGHT BEARING RESTRICTIONS No   PATIENT GOALS : Pt would like strengthen the neck and upper quarter. Pt would like to also improve his balance.   OBJECTIVE:   DIAGNOSTIC FINDINGS:  FINDINGS: 1st cross-table lateral view demonstrates anterior localizing instrument directed at the C4-5 level.   Second lateral intraoperative image demonstrates 2 level anterior fusion from C3-C5. C5 vertebral body not well visualized due to overlying shoulders.   IMPRESSION: Intraoperative imaging as above.  PATIENT SURVEYS:  FOTO [id number] at D/C    TODAY'S TREATMENT:   Manual  therapy: STM suboccipitals  Exercises UBE L1 3 min retro and fwd Bilat shoulder ER GTB 2x10 Horizontal ABD GTB 2x10 Standing row blue TB 2x10 Standing shoulder ext blue TB 2x10 Wall push up 3x10 Scap depression at table- arms at sides 2s 2x10    PATIENT EDUCATION:  Education details:  anatomy, exercise progression, DOMS expectations, muscle firing,  envelope of function, HEP, POC  Person educated: Patient Education method: Explanation, Demonstration, Tactile cues, Verbal cues, and Handouts Education comprehension: verbalized understanding, returned demonstration, verbal cues required, and tactile cues required   HOME EXERCISE PROGRAM: Access Code: [email or web address] URL: [email or web address] Date: 07/30/2021 Prepared by: Daleen Bo  ASSESSMENT:  CLINICAL IMPRESSION: Pt with mild suboccipital stiffness at beginning of session that was relieved with STM. Pt then able to increase intensity of loading with scapular retractors as well as beginning to introduce CKC loaded movements without increase in neck stiffness or pain. Pt report of baseline pain unchanged likely due to recency of surgery in conjunction with initial phases of postural strengthening. HEP updated to increase intensity but not additional exercise. Plan to introduce light OH stability/strength at next session if no increase in pain from today.   Objective impairments include decreased ROM, decreased strength, hypomobility, increased fascial restrictions, increased muscle spasms, impaired flexibility, impaired UE functional use, improper body mechanics, postural dysfunction, and pain. These impairments are limiting patient from cleaning, community activity, driving, meal prep, laundry, yard work, shopping, and exercise/recreation . Personal factors including Age, Behavior pattern, Past/current experiences, Time since onset of injury/illness/exacerbation, and 1-2 comorbidities:    are also affecting patient's functional  outcome. Patient will benefit from skilled PT to address above impairments and improve overall function.  REHAB POTENTIAL: Fair    CLINICAL DECISION MAKING: Evolving/moderate complexity  EVALUATION COMPLEXITY: Moderate   GOALS:   SHORT TERM GOALS:  STG Name Target Date Goal status  1 Pt will become independent with HEP in order to demonstrate synthesis of PT education.  10/05/2021 INITIAL  2 Pt will be able to demonstrate OH ROM and reach in order to demonstrate functional improvement in UE function for self-care and house hold duties.  10/05/2021 INITIAL  3 Pt will be able to demonstrate at least 20% increase in cervical rotation in order to demonstrate functional improvement in C/S function for self-care in driving tasks.   10/05/2021 INITIAL   LONG TERM GOALS:   LTG Name Target Date Goal status  1 Pt  will become independent with final HEP in order to demonstrate synthesis of PT education.  11/16/2021 INITIAL  2 Pt will be able to reach  OH and carry/hold >5 lbs in order to demonstrate functional improvement in UE strength for return to PLOF and exercise.   11/16/2021 INITIAL  3 Pt will be able to demonstrate ability to stand/turn/pivot without LOB in order to demonstrate functional improvement in balance function for community and household mobility.  11/16/2021 INITIAL  4 Pt will score >/= 58 on FOTO to demonstrate improvement in perceived cervical function.  11/16/2021 INITIAL   PLAN: PT FREQUENCY: 1-2x/week  PT DURATION: 12 weeks (likely d/c by 8)  PLANNED INTERVENTIONS: Therapeutic exercises, Therapeutic activity, Neuro Muscular re-education, Balance training, Gait training, Patient/Family education, and Joint mobilization  PLAN FOR NEXT SESSION: review HEP, STM, shoulder ROM and strength  Daleen Bo PT, DPT 08/24/21 10:56 AM

## 2021-08-31 ENCOUNTER — Encounter: Payer: Medicare Other | Attending: Urology | Admitting: Dietician

## 2021-08-31 ENCOUNTER — Other Ambulatory Visit: Payer: Self-pay

## 2021-08-31 ENCOUNTER — Encounter: Payer: Self-pay | Admitting: Dietician

## 2021-08-31 DIAGNOSIS — E139 Other specified diabetes mellitus without complications: Secondary | ICD-10-CM

## 2021-08-31 DIAGNOSIS — Z713 Dietary counseling and surveillance: Secondary | ICD-10-CM | POA: Insufficient documentation

## 2021-08-31 DIAGNOSIS — E118 Type 2 diabetes mellitus with unspecified complications: Secondary | ICD-10-CM | POA: Diagnosis not present

## 2021-08-31 DIAGNOSIS — N200 Calculus of kidney: Secondary | ICD-10-CM | POA: Diagnosis not present

## 2021-08-31 NOTE — Patient Instructions (Addendum)
Avoid Calcium carbonate such as Tums, Rolaids, or any other chewable antacid.  Choose diet juices, or clear sugar-free sodas.   Keep a water bottle (Nalgene brand) with you throughout the day to sip from. This can help to increase your fluid intake. Your goal is 2 L (64 oz) of plain water each day.  Read your High Oxalate Food List carefully to determine which high oxalate foods that you eat regularly, and look for lower oxalate foods to replace them with.

## 2021-08-31 NOTE — Progress Notes (Signed)
Medical Nutrition Therapy  Appointment Start time:  0800  Appointment End time:  0905  Primary concerns today: Kidney stone nutrition therapy  Referral diagnosis: E11.8 - Type 2 Diabetes Mellitus with unspecified complications Preferred learning style: No preference indicated Learning readiness: Ready   NUTRITION ASSESSMENT   Anthropometrics  None taken  Clinical Medical Hx: LADA DM, Cholestasias, HTN, Prostate cancer Medications: Humalog (pump), Novolog (when not using pump), Dexcom G6, Lantus, Zetia, Flomax, Potassium Citrate Labs: A1c 7.1  Urology: Urine Volume - Low, Urine Oxalate - High, Urine Sodium - High, Urine Magnesium - High, Uric Acid - High Notable Signs/Symptoms: N/A  Lifestyle & Dietary Hx Pt wife Kendrick Fries is present during appointment Pt is interested in a low oxalate diet, has extensive history of kidney stones. Pt reports a history of combination stones, uric and oxalate. Most recent episode was December, 2022. Pt has stent placed. Pt has LADA T1DM, had been insulin dependant since 1995. Pt reports being able to control their blood sugar fairly well. Pt usually uses Dexcom G6/Tandem system, but is currently doing MDI. Most recent A1c is 7.1.  Pt reports they are a current tobacco user, would like to quit. Pt states they are dreading the process of quitting but know they need to. Pt reports very seldom alcohol consumption.  Estimated daily fluid intake: 48 oz Supplements: N/A Sleep: OSA Stress / self-care: Stressed about DM and kidney stones. Current average weekly physical activity: ADLs, walks 3-4 days for 30 minutes  24-Hr Dietary Recall First Meal: 1 egg, 2 strips of bacon, 2 slices of honey wheat bread, 2 cups of water Snack: Chili, potato salad, water Second Meal: Chicken wings, bread, water Snack: none Third Meal: 3 slices of sausage, pepperoni and onion pizza Snack: none Beverages: water   NUTRITION DIAGNOSIS  NB-1.1 Food and nutrition-related  knowledge deficit As related to kidney stones.  As evidenced by history of both uric and calcium oxalate cholestasias, low fluid intake, and diet high in purines and oxalates. .   NUTRITION INTERVENTION  Nutrition education (E-1) on the following topics:  Educated patient on the importance of beginning to identify foods in their diet that are high sources of oxalates, and strategies to lower their consumption of oxalates. Educated patient on the role of animal based protein on uric acid levels. Educated patient on appropriate serving sized of protein choices. Advised patient to increase their water intake to a minimum of 64 oz each day.  Handouts Provided Include  Kidney Stone Nutrition Therapy Nutrition Care Manual High Oxalate Food List ADA: How to Thrive with Diabetes  Learning Style & Readiness for Change Teaching method utilized: Visual & Auditory  Demonstrated degree of understanding via: Teach Back  Barriers to learning/adherence to lifestyle change: None  Goals Established by Pt Avoid Calcium carbonate such as Tums, Rolaids, or any other chewable antacid. Choose diet juices, or clear sugar-free sodas.  Keep a water bottle (Nalgene brand) with you throughout the day to sip from. This can help to increase your fluid intake. Your goal is 2 L (64 oz) of plain water each day. Read your High Oxalate Food List carefully to determine which high oxalate foods that you eat regularly, and look for lower oxalate foods to replace them with.   MONITORING & EVALUATION Dietary intake, weekly physical activity, and oxalate and purine intake in 6-8 weeks.  Next Steps  Patient is to follow up with RD.

## 2021-09-01 ENCOUNTER — Encounter (HOSPITAL_BASED_OUTPATIENT_CLINIC_OR_DEPARTMENT_OTHER): Payer: Self-pay | Admitting: Physical Therapy

## 2021-09-01 ENCOUNTER — Ambulatory Visit (HOSPITAL_BASED_OUTPATIENT_CLINIC_OR_DEPARTMENT_OTHER): Payer: Medicare Other | Admitting: Physical Therapy

## 2021-09-01 DIAGNOSIS — M6281 Muscle weakness (generalized): Secondary | ICD-10-CM | POA: Diagnosis not present

## 2021-09-01 DIAGNOSIS — M542 Cervicalgia: Secondary | ICD-10-CM | POA: Diagnosis not present

## 2021-09-01 NOTE — Therapy (Signed)
OUTPATIENT PHYSICAL THERAPY CERVICAL TREATMENT   Patient Name: Anthony Daniel. MRN: [id number] DOB:Aug 20, 1950, 71 y.o., male Today's Date: 09/01/2021   PT End of Session - 09/01/21 1020     Visit Number 5    Number of Visits 18    Date for PT Re-Evaluation 10/28/21    Authorization Type Medicare    PT Start Time 0865    PT Stop Time 1055    PT Time Calculation (min) 40 min    Activity Tolerance Patient tolerated treatment well;Patient limited by pain    Behavior During Therapy WFL for tasks assessed/performed                 Past Medical History:  Diagnosis Date   Arthritis    Back   Benign localized prostatic hyperplasia with lower urinary tract symptoms (LUTS)    CKD (chronic kidney disease), stage II    History of bladder cancer    1998--  TCC   History of kidney stones    managed by urologist--- dr Jeffie Pollock, dr prerminger @[facility] , and dr [email or web address] @[email or web address]    Hyperlipidemia    Hypertension    Insulin pump in place    02-02-2021 per pt currently not using pump, doing lantus insulin bid as directed by dr Buddy Duty   LADA (latent autoimmune diabetes in adults), managed as type 1 [facility])    endocrinologist-- dr Buddy Duty   PONV (postoperative nausea and vomiting)    Prostate cancer [facility])    urologist--- dr Jeffie Pollock--  first dx 02/ 2020 gleason 3+3, active surveillance;  bx 09-23-2020  Gleason 4+4, PSA 7.64   Spinal stenosis, cervical region    Uses self-applied continuous glucose monitoring device    DEX-COM   Past Surgical History:  Procedure Laterality Date   ANTERIOR CERVICAL DECOMP/DISCECTOMY FUSION N/A 12/15/2020   Procedure: Cervical Three-Four Cervical Four-Five Anterior cervical decompression/discectomy/fusion;  Surgeon: Erline Levine, MD;  Location: [place];  Service: Neurosurgery;  Laterality: N/A;   COLONOSCOPY  2018   x2   CYSTOSCOPY  02/09/2021   Procedure: CYSTOSCOPY FLEXIBLE;  Surgeon: Irine Seal, MD;  Location: [facility];  Service:  Urology;;   CYSTOSCOPY W/ URETERAL STENT PLACEMENT Right 05/12/2015   Procedure: CYSTOSCOPY WITH STENT REPLACEMENT;  Surgeon: Cleon Gustin, MD;  Location: [facility];  Service: Urology;  Laterality: Right;   CYSTOSCOPY WITH RETROGRADE PYELOGRAM, URETEROSCOPY AND STENT PLACEMENT Right 01/23/2019   Procedure: CYSTOSCOPY WITH RIGHT RETROGRADE PYELOGRAM, URETEROSCOPY AND STENT PLACEMENT;  Surgeon: Irine Seal, MD;  Location: [facility];  Service: Urology;  Laterality: Right;   CYSTOSCOPY WITH URETEROSCOPY AND STENT PLACEMENT Right 04/23/2015   Procedure: CYSTOSCOPY WITH URETEROSCOPY RETROGRADE PYELOGRAM AND STENT PLACEMENT;  Surgeon: Cleon Gustin, MD;  Location: [facility];  Service: Urology;  Laterality: Right;   CYSTOSCOPY/RETROGRADE/URETEROSCOPY/STONE EXTRACTION WITH BASKET Right 05/12/2015   Procedure: CYSTOSCOPY/RETROGRADE/URETEROSCOPY/STONE EXTRACTION WITH BASKET;  Surgeon: Cleon Gustin, MD;  Location: [facility];  Service: Urology;  Laterality: Right;   CYSTOSCOPY/URETEROSCOPY/HOLMIUM LASER/STENT PLACEMENT Left 09/18/2018   Procedure: CYSTOSCOPY/ LEFT RETROGRADE LEFT URETEROSCOPY [place] LASER/STENT PLACEMENT;  Surgeon: Irine Seal, MD;  Location: [facility];  Service: Urology;  Laterality: Left;   CYSTOSCOPY/URETEROSCOPY/HOLMIUM LASER/STENT PLACEMENT Right 10/24/2019   Procedure: CYSTOSCOPY/URETEROSCOPY/HOLMIUM LASER/STENT PLACEMENT;  Surgeon: Irine Seal, MD;  Location: [facility];  Service: Urology;  Laterality: Right;   CYSTOSCOPY/URETEROSCOPY/HOLMIUM LASER/STENT PLACEMENT Left 01/30/2020   @[facility]    CYSTOSCOPY/URETEROSCOPY/HOLMIUM LASER/STENT PLACEMENT Left 07/13/2021   Procedure: CYSTOSCOPY/URETEROSCOPY/HOLMIUM LASER/STENT PLACEMENT;  Surgeon: Lucas Mallow, MD;  Location: [facility];  Service: Urology;  Laterality: Left;   EXTRACORPOREAL SHOCK WAVE LITHOTRIPSY  x2 in  2006//   x2  in 2012   HOLMIUM LASER APPLICATION Right 03/50/0938    Procedure: HOLMIUM LASER APPLICATION;  Surgeon: Cleon Gustin, MD;  Location: [facility];  Service: Urology;  Laterality: Right;   HOLMIUM LASER APPLICATION Right 18/29/9371   Procedure: HOLMIUM LASER APPLICATION;  Surgeon: Irine Seal, MD;  Location[facility];  Service: Urology;  Laterality: Right;   IR URETERAL STENT RIGHT NEW ACCESS W/O SEP NEPHROSTOMY CATH  11/25/2019   LAPAROSCOPIC APPENDECTOMY N/A 11/07/2012   Procedure: APPENDECTOMY LAPAROSCOPIC;  Surgeon: Harl Bowie, MD;  Location: [place];  Service: General;  Laterality: N/A;   NEPHROLITHOTOMY Left 08/06/2015   Procedure: 1ST STAGE  LEFT PERCUTANEOUS NEPHROLITHOTOMY ;  Surgeon: Irine Seal, MD;  Location: [facility];  Service: Urology;  Laterality: Left;   NEPHROLITHOTOMY Right 11/26/2019   Procedure: NEPHROLITHOTOMY PERCUTANEOUS;  Surgeon: Irine Seal, MD;  Location[facility];  Service: Urology;  Laterality: Right;   NEPHROLITHOTOMY Right 11/28/2019   Procedure: NEPHROLITHOTOMY PERCUTANEOUS SECOND LOOK;  Surgeon: Alexis Frock, MD;  Location[facility];  Service: Urology;  Laterality: Right;   RADIOACTIVE SEED IMPLANT N/A 02/09/2021   Procedure: RADIOACTIVE SEED IMPLANT/BRACHYTHERAPY IMPLANT;  Surgeon: Irine Seal, MD;  Location: [facility];  Service: Urology;  Laterality: N/A;   SPACE OAR INSTILLATION N/A 02/09/2021   Procedure: SPACE OAR INSTILLATION;  Surgeon: Irine Seal, MD;  Location: [facility];  Service: Urology;  Laterality: N/A;   TONSILLECTOMY  1975   TRANSURETHRAL RESECTION OF BLADDER TUMOR  1998   Patient Active Problem List   Diagnosis Date Noted   Cervical stenosis of spinal canal 12/15/2020   Acute otitis media 12/04/2020   Cervical stenosis of spine 10/06/2020   Weakness of right lower extremity 09/11/2020   Radiculitis of right cervical region 09/11/2020   Weakness of right upper extremity 09/11/2020   Ataxia 09/11/2020   Right kidney stone 11/26/2019    Diuretic-induced hypokalemia 08/29/2019   Prostate cancer ([place]) 09/06/2018   Tobacco abuse 07/17/2018   Essential hypertension 03/07/2018   Seasonal allergic rhinitis due to pollen 09/26/2017   Spinal stenosis in cervical region 12/01/2015   Sleep apnea in adult 06/24/2015   DDD (degenerative disc disease), cervical 11/10/2014   Background diabetic retinopathy ([place]) 08/05/2013   Hyperlipidemia with target LDL less than 100 11/05/2012   Routine general medical examination at a health care facility 06/26/2012   LADA (latent autoimmune diabetes in adults), managed as type 1 ([place]) 06/26/2012    PCP: Janith Lima, MD  REFERRING PROVIDER: Janith Lima, MD  REFERRING DIAG: M54.2 (ICD-10-CM) - Cervicalgia  THERAPY DIAG:  Cervicalgia  Muscle weakness (generalized)  ONSET DATE: 2021  SUBJECTIVE:  SUBJECTIVE STATEMENT: Pt states he felt good after last session. The pain has improved "slightly." The pain is off and on now. Neck is still stiff.   PERTINENT HISTORY:  Cervical fusion C3-C5- May/July 2022, DDD, HTN, HLD  PAIN:  Are you having pain? Yes NPRS scale: 2/10 R UT, aching sore   PRECAUTIONS: None  WEIGHT BEARING RESTRICTIONS No   PATIENT GOALS : Pt would like strengthen the neck and upper quarter. Pt would like to also improve his balance.   OBJECTIVE:   DIAGNOSTIC FINDINGS:  FINDINGS: 1st cross-table lateral view demonstrates anterior localizing instrument directed at the C4-5 level.   Second lateral intraoperative image demonstrates 2 level anterior fusion from C3-C5. C5 vertebral body not well visualized due to overlying shoulders.   IMPRESSION: Intraoperative imaging as above.  PATIENT SURVEYS:  FOTO 51 58 at D/C    TODAY'S TREATMENT:    Exercises UBE L1 3 min retro and fwd Bilat shoulder ER GTB 2x10 Horizontal ABD GTB 2x10 Pec stretch at doorway arm at 60 30s 2x Wall ball CW and CCW 2x20 at 120 2lb OHP 2x8 neutral palm  Standing shoulder ext blue TB 2x10 Wall push up 3x10 Diagonals RTB 4x Scap depression at table- arms at sides 2s 2x10    PATIENT EDUCATION:  Education details:  anatomy, exercise progression, DOMS expectations, muscle firing,  envelope of function, HEP, POC  Person educated: Patient Education method: Explanation, Demonstration, Tactile cues, Verbal cues, and Handouts Education comprehension: verbalized understanding, returned demonstration, verbal cues required, and tactile cues required   HOME EXERCISE PROGRAM: Access Code: [email or web address] URL: [email or web address] Date: 09/01/2021 Prepared by: Daleen Bo  Exercises Seated Cervical Retraction - 2 x daily - 7 x weekly - 2 sets - 10 reps Seated Thoracic Lumbar Extension - 1 x daily - 7 x weekly - 2 sets - 10 reps - 5 hold Standing Shoulder Row with Anchored Resistance - 1 x daily - 7 x weekly - 3 sets - 10 reps Standing Shoulder External Rotation with Resistance - 1 x daily - 7 x weekly - 2 sets - 10 reps Wall Push Up - 1 x daily - 7 x weekly - 2 sets - 10 reps    ASSESSMENT:  CLINICAL IMPRESSION: Pt able to continue with strengthening at today's session and add in upper quarter stability exercise above shoulder level without increasing feeling of neck and shoulder stiffness. Pt HEP updated at this time to include more strengthening and CKC exercise. Pt progressing well with stability exercise. Pt to benefit from 1x/week frequency in order to check in and progress HEP as tolerated. Focus still on postural strength and flexibility- weakness noted with OHP  Objective impairments include decreased ROM, decreased strength, hypomobility, increased fascial restrictions, increased muscle spasms, impaired flexibility, impaired UE  functional use, improper body mechanics, postural dysfunction, and pain. These impairments are limiting patient from cleaning, community activity, driving, meal prep, laundry, yard work, shopping, and exercise/recreation . Personal factors including Age, Behavior pattern, Past/current experiences, Time since onset of injury/illness/exacerbation, and 1-2 comorbidities:    are also affecting patient's functional outcome. Patient will benefit from skilled PT to address above impairments and improve overall function.  REHAB POTENTIAL: Fair    CLINICAL DECISION MAKING: Evolving/moderate complexity  EVALUATION COMPLEXITY: Moderate   GOALS:   SHORT TERM GOALS:  STG Name Target Date Goal status  1 Pt will become independent with HEP in order to demonstrate synthesis of PT education.  10/13/2021 INITIAL  2 Pt will be  able to demonstrate OH ROM and reach in order to demonstrate functional improvement in UE function for self-care and house hold duties.  10/13/2021 INITIAL  3 Pt will be able to demonstrate at least 20% increase in cervical rotation in order to demonstrate functional improvement in C/S function for self-care in driving tasks.   10/13/2021 INITIAL   LONG TERM GOALS:   LTG Name Target Date Goal status  1 Pt  will become independent with final HEP in order to demonstrate synthesis of PT education.  11/24/2021 INITIAL  2 Pt will be able to reach [facility] and carry/hold >5 lbs in order to demonstrate functional improvement in UE strength for return to PLOF and exercise.   11/24/2021 INITIAL  3 Pt will be able to demonstrate ability to stand/turn/pivot without LOB in order to demonstrate functional improvement in balance function for community and household mobility.  11/24/2021 INITIAL  4 Pt will score >/= 58 on FOTO to demonstrate improvement in perceived cervical function.  11/24/2021 INITIAL   PLAN: PT FREQUENCY: 1-2x/week  PT DURATION: 12 weeks (likely d/c by 8)  PLANNED INTERVENTIONS:  Therapeutic exercises, Therapeutic activity, Neuro Muscular re-education, Balance training, Gait training, Patient/Family education, and Joint mobilization  PLAN FOR NEXT SESSION: review HEP, STM, shoulder ROM and strength. Foster Simpson PT, DPT 09/01/21 10:57 AM

## 2021-09-03 ENCOUNTER — Ambulatory Visit: Payer: Medicare Other

## 2021-09-06 ENCOUNTER — Encounter: Payer: Self-pay | Admitting: Internal Medicine

## 2021-09-07 ENCOUNTER — Ambulatory Visit (INDEPENDENT_AMBULATORY_CARE_PROVIDER_SITE_OTHER): Payer: Medicare Other | Admitting: Internal Medicine

## 2021-09-07 ENCOUNTER — Other Ambulatory Visit: Payer: Self-pay

## 2021-09-07 ENCOUNTER — Encounter: Payer: Self-pay | Admitting: Internal Medicine

## 2021-09-07 VITALS — BP 128/80 | HR 79 | Temp 98.1°F | Ht 73.0 in | Wt 252.0 lb

## 2021-09-07 DIAGNOSIS — K219 Gastro-esophageal reflux disease without esophagitis: Secondary | ICD-10-CM | POA: Diagnosis not present

## 2021-09-07 DIAGNOSIS — Z0001 Encounter for general adult medical examination with abnormal findings: Secondary | ICD-10-CM

## 2021-09-07 DIAGNOSIS — R82998 Other abnormal findings in urine: Secondary | ICD-10-CM

## 2021-09-07 DIAGNOSIS — I10 Essential (primary) hypertension: Secondary | ICD-10-CM | POA: Diagnosis not present

## 2021-09-07 DIAGNOSIS — H608X3 Other otitis externa, bilateral: Secondary | ICD-10-CM

## 2021-09-07 DIAGNOSIS — E785 Hyperlipidemia, unspecified: Secondary | ICD-10-CM

## 2021-09-07 DIAGNOSIS — E113299 Type 2 diabetes mellitus with mild nonproliferative diabetic retinopathy without macular edema, unspecified eye: Secondary | ICD-10-CM

## 2021-09-07 DIAGNOSIS — E139 Other specified diabetes mellitus without complications: Secondary | ICD-10-CM | POA: Diagnosis not present

## 2021-09-07 LAB — CBC WITH DIFFERENTIAL/PLATELET
Basophils Absolute: 0.1 10*3/uL (ref 0.0–0.1)
Basophils Relative: 0.7 % (ref 0.0–3.0)
Eosinophils Absolute: 0.5 10*3/uL (ref 0.0–0.7)
Eosinophils Relative: 5.5 % — ABNORMAL HIGH (ref 0.0–5.0)
HCT: 41.8 % (ref 39.0–52.0)
Hemoglobin: 13.8 g/dL (ref 13.0–17.0)
Lymphocytes Relative: 37.5 % (ref 12.0–46.0)
Lymphs Abs: 3.2 10*3/uL (ref 0.7–4.0)
MCHC: 33 g/dL (ref 30.0–36.0)
MCV: 89.1 fl (ref 78.0–100.0)
Monocytes Absolute: 0.6 10*3/uL (ref 0.1–1.0)
Monocytes Relative: 6.8 % (ref 3.0–12.0)
Neutro Abs: 4.2 10*3/uL (ref 1.4–7.7)
Neutrophils Relative %: 49.5 % (ref 43.0–77.0)
Platelets: 183 10*3/uL (ref 150.0–400.0)
RBC: 4.69 Mil/uL (ref 4.22–5.81)
RDW: 14.9 % (ref 11.5–15.5)
WBC: 8.5 10*3/uL (ref 4.0–10.5)

## 2021-09-07 LAB — MICROALBUMIN / CREATININE URINE RATIO
Creatinine,U: 133 mg/dL
Microalb Creat Ratio: 1.1 mg/g (ref 0.0–30.0)
Microalb, Ur: 1.4 mg/dL (ref 0.0–1.9)

## 2021-09-07 LAB — URINALYSIS, ROUTINE W REFLEX MICROSCOPIC
Bilirubin Urine: NEGATIVE
Ketones, ur: NEGATIVE
Leukocytes,Ua: NEGATIVE
Nitrite: NEGATIVE
Specific Gravity, Urine: 1.025 (ref 1.000–1.030)
Total Protein, Urine: NEGATIVE
Urine Glucose: 250 — AB
Urobilinogen, UA: 0.2 (ref 0.0–1.0)
pH: 5.5 (ref 5.0–8.0)

## 2021-09-07 LAB — LIPID PANEL
Cholesterol: 155 mg/dL (ref 0–200)
HDL: 43.7 mg/dL (ref >39.00)
LDL Cholesterol: 93 mg/dL (ref 0–99)
NonHDL: 111.12
Total CHOL/HDL Ratio: 4
Triglycerides: 93 mg/dL (ref 0.0–149.0)
VLDL: 18.6 mg/dL (ref 0.0–40.0)

## 2021-09-07 LAB — HEMOGLOBIN A1C: Hgb A1c MFr Bld: 8.2 % — ABNORMAL HIGH (ref 4.6–6.5)

## 2021-09-07 LAB — TSH: TSH: 0.82 u[IU]/mL (ref 0.35–5.50)

## 2021-09-07 LAB — URIC ACID: Uric Acid, Serum: 6.4 mg/dL (ref 4.0–7.8)

## 2021-09-07 MED ORDER — NEOMYCIN-POLYMYXIN-HC 3.5-10000-1 OT SUSP: 3.0000 [drp] | Freq: Three times a day (TID) | OTIC | 2 refills | Status: AC

## 2021-09-07 MED ORDER — OMEPRAZOLE 20 MG PO CPDR: 20.0000 mg | DELAYED_RELEASE_CAPSULE | Freq: Every day | ORAL | 1 refills | Status: DC

## 2021-09-07 NOTE — Progress Notes (Signed)
Subjective:  Patient ID: Anthony Skains., male    DOB: 07/22/1950  Age: 71 y.o. MRN: [id number]  CC: Annual Exam, Hypertension, Hyperlipidemia, and Diabetes  This visit occurred during the SARS-CoV-2 public health emergency.  Safety protocols were in place, including screening questions prior to the visit, additional usage of staff PPE, and extensive cleaning of exam room while observing appropriate contact time as indicated for disinfecting solutions.    HPI Anthony Roosevelt. presents for a CPX and f/up -  He has been seen elsewhere for kidney stones and has been told that he has uric acid crystals as well as calcium oxalate stones.  He denies any recent episodes of abdominal/flank pain, dysuria, or hematuria.  He complains of heartburn and wants prescription for PPI.  He denies odynophagia, dysphagia, loss of appetite, weight loss, chest pain, shortness of breath, or diaphoresis.  My concerns for 09/07/2021 Tuesday's 10Am visit are as follow; 1. Requesting an annual physical 2. Requesting to have a kidney test performed for efficiency & functionality. I have had chronic kidney stones with numerous surgeries. (type 1 diabetic ) please note: I had a 24 hour urine test conducted in January 2023 results are in [facility] my chart records. So maybe just the blood test is needed? 3. Requesting to have both ears examined and cleaned. I experience on & off pain. Thanks I hope this helps.  Outpatient Medications Prior to Visit  Medication Sig Dispense Refill   aspirin EC 81 MG tablet Take 81 mg by mouth in the morning.     candesartan (ATACAND) 16 MG tablet TAKE 1 TABLET BY MOUTH EVERY DAY 90 tablet 0   Ciclopirox 0.77 % gel APPLY TO AFFECTED AREA TWICE A DAY 30 g 2   Continuous Blood Gluc Receiver (DEXCOM G6 RECEIVER) DEVI See admin instructions.     Continuous Blood Gluc Sensor (DEXCOM G6 SENSOR) MISC change sensor     Continuous Blood Gluc Transmit (DEXCOM G6 TRANSMITTER) MISC See admin  instructions.     ezetimibe (ZETIA) 10 MG tablet TAKE 1 TABLET BY MOUTH EVERY DAY 90 tablet 1   Glucagon (GVOKE HYPOPEN 1-PACK) 1 MG/0.2ML SOAJ Inject 1 Act into the skin daily as needed (hypoglycemia). 0.2 mL 2   glucose blood (ONETOUCH VERIO) test strip 5X A DAY     indapamide (LOZOL) 1.25 MG tablet TAKE 1 TABLET BY MOUTH EVERY DAY 90 tablet 0   insulin aspart (NOVOLOG) 100 UNIT/ML injection Inject into the skin as directed. USE VIA INSULIN PUMP TO ADMINISTER UP TO 130 UNITS MAXIUM DAILY     Insulin Glargine (LANTUS SOLOSTAR [place]) Inject into the skin 2 (two) times daily. 25 UNITS IN AM AND  20--25 UNITS;  WHEN FASTING OR UNABLE TO EAT LOWER TO 18 UNITS TWICE DAILY     levocetirizine (XYZAL) 5 MG tablet TAKE 1 TABLET BY MOUTH DAILY AS NEEDED FOR ALLERGIES. 90 tablet 1   NOVOLOG FLEXPEN 100 UNIT/ML FlexPen Inject 12-15 Units into the skin 3 (three) times daily before meals. WHEN NOT USING INSULIN PUMP ;  WHEN FASTING OR UNABLE TO EAT ONLY WHEN NEEDED TO TREAT BLOOD SUGAR >200     ondansetron (ZOFRAN ODT) 4 MG disintegrating tablet Take 1 tablet (4 mg total) by mouth every 8 (eight) hours as needed for nausea or vomiting. 12 tablet 0   Polyethyl Glycol-Propyl Glycol (SYSTANE OP) Place 1 drop into both eyes 3 (three) times daily as needed (dry/irritated eyes).  Potassium Citrate 15 MEQ (1620 MG) TBCR Take 1 tablet by mouth in the morning and at bedtime.     tamsulosin (FLOMAX) 0.4 MG CAPS capsule Take 0.4 mg by mouth in the morning.     cefdinir (OMNICEF) 300 MG capsule Take 1 capsule (300 mg total) by mouth 2 (two) times daily. 14 capsule 0   HYDROcodone-acetaminophen (NORCO/VICODIN) 5-325 MG tablet Take 1 tablet by mouth every 4 (four) hours as needed for moderate pain ((score 4 to 6)). 10 tablet 0   metaxalone (SKELAXIN) 400 MG tablet Take 400 mg by mouth in the morning.     methocarbamol (ROBAXIN) 500 MG tablet Take 1 tablet (500 mg total) by mouth every 6 (six) hours as needed for muscle spasms.  90 tablet 0   SIMPLY SALINE NA Place 1-2 sprays into the nose 3 (three) times daily as needed (congestion/allergies).     Insulin Human (INSULIN PUMP) SOLN Inject 1 each into the skin continuous. Novolog insulin (Patient not taking: Reported on 09/07/2021)     No facility-administered medications prior to visit.    ROS Review of Systems  Constitutional:  Negative for chills, diaphoresis, fatigue and fever.  HENT:  Positive for ear pain. Negative for ear discharge.   Eyes: Negative.   Respiratory:  Negative for cough, chest tightness, shortness of breath and wheezing.   Cardiovascular:  Negative for chest pain, palpitations and leg swelling.  Gastrointestinal:  Negative for abdominal pain, constipation, diarrhea, nausea and vomiting.  Endocrine: Negative.   Genitourinary: Negative.  Negative for difficulty urinating.  Musculoskeletal: Negative.  Negative for arthralgias and myalgias.  Skin: Negative.  Negative for color change, pallor and rash.  Neurological:  Negative for dizziness, weakness, light-headedness and headaches.  Hematological:  Negative for adenopathy. Does not bruise/bleed easily.  Psychiatric/Behavioral: Negative.     Objective:  BP 128/80 (BP Location: Left Arm, Patient Position: Sitting, Cuff Size: Large)    Pulse 79    Temp 98.1 F (36.7 C) (Oral)    Ht '6\' 1"'  (1.854 m)    Wt 252 lb (114.3 kg)    SpO2 98%    BMI 33.25 kg/m   BP Readings from Last 3 Encounters:  09/07/21 128/80  07/13/21 (!) 121/59  07/12/21 (!) 144/70    Wt Readings from Last 3 Encounters:  09/07/21 252 lb (114.3 kg)  07/13/21 250 lb (113.4 kg)  02/23/21 247 lb (112 kg)    Physical Exam Vitals reviewed.  HENT:     Right Ear: Hearing, tympanic membrane and external ear normal. There is no impacted cerumen.     Left Ear: Hearing, tympanic membrane and external ear normal. There is no impacted cerumen.     Ears:     Comments: Scaling in both EACs.    Nose: Nose normal.     Mouth/Throat:      Pharynx: No oropharyngeal exudate.  Eyes:     General: No scleral icterus.    Conjunctiva/sclera: Conjunctivae normal.  Cardiovascular:     Rate and Rhythm: Normal rate and regular rhythm.     Heart sounds: No murmur heard. Pulmonary:     Effort: Pulmonary effort is normal.     Breath sounds: No stridor. No wheezing, rhonchi or rales.  Abdominal:     General: Abdomen is flat.     Palpations: There is no mass.     Tenderness: There is no abdominal tenderness. There is no guarding or rebound.     Hernia: No hernia is present.  Musculoskeletal:        General: Normal range of motion.     Cervical back: Neck supple.     Right lower leg: No edema.     Left lower leg: No edema.  Lymphadenopathy:     Cervical: No cervical adenopathy.  Skin:    General: Skin is warm and dry.     Coloration: Skin is not pale.  Neurological:     General: No focal deficit present.     Mental Status: He is alert and oriented to person, place, and time.  Psychiatric:        Mood and Affect: Mood normal.        Behavior: Behavior normal.    Lab Results  Component Value Date   WBC 8.5 09/07/2021   HGB 13.8 09/07/2021   HCT 41.8 09/07/2021   PLT 183.0 09/07/2021   GLUCOSE 224 (H) 07/12/2021   CHOL 155 09/07/2021   TRIG 93.0 09/07/2021   HDL 43.70 09/07/2021   LDLCALC 93 09/07/2021   ALT 17 07/12/2021   AST 20 07/12/2021   NA 133 (L) 07/12/2021   K 4.1 07/12/2021   CL 98 07/12/2021   CREATININE 1.98 (H) 07/12/2021   BUN 19 07/12/2021   CO2 24 07/12/2021   TSH 0.82 09/07/2021   PSA 0.89 05/31/2021   INR 0.9 02/05/2021   HGBA1C 8.2 (H) 09/07/2021   MICROALBUR 1.4 09/07/2021    DG C-Arm 1-60 Min-No Report  Result Date: 07/13/2021 Fluoroscopy was utilized by the requesting physician.  No radiographic interpretation.   DG C-Arm 1-60 Min-No Report  Result Date: 07/13/2021 Fluoroscopy was utilized by the requesting physician.  No radiographic interpretation.    Assessment & Plan:    Anthony Daniel was seen today for annual exam, hypertension, hyperlipidemia and diabetes.  Diagnoses and all orders for this visit:  Essential hypertension-his blood pressure is adequately well controlled. -     Urinalysis, Routine w reflex microscopic; Future -     CBC with Differential/Platelet; Future -     TSH; Future -     TSH -     CBC with Differential/Platelet -     Urinalysis, Routine w reflex microscopic  Hyperlipidemia with target LDL less than 100- LDL goal achieved. Doing well on the statin  -     Lipid panel; Future -     TSH; Future -     TSH -     Lipid panel  LADA (latent autoimmune diabetes in adults), managed as type 1 ([place])- His blood sugar is adequately well controlled. -     Microalbumin / creatinine urine ratio; Future -     Hemoglobin A1c; Future -     Hemoglobin A1c -     Microalbumin / creatinine urine ratio  Background diabetic retinopathy ([place])- Will continue to work on risk factor modifications.  Uric acid crystalluria- I will screen for hypersensitivity to allopurinol.  His uric acid level is very mildly elevated. -     Uric acid; Future -     HLA-B*58:01 Typing; Future -     HLA-B*58:01 Typing -     Uric acid  Encounter for general adult medical examination with abnormal findings- Exam completed, labs reviewed, vaccines reviewed and updated, cancer screenings are up-to-date, patient education was given.  Chronic eczematous otitis externa of both ears -     neomycin-polymyxin-hydrocortisone (CORTISPORIN) 3.5-10000-1 OTIC suspension; Place 3 drops into both ears 3 (three) times daily.  Gastroesophageal reflux disease without esophagitis -  omeprazole (PRILOSEC) 20 MG capsule; Take 1 capsule (20 mg total) by mouth daily.   I have discontinued Raek[employer] T[employer] Genera. "Tommy"'s metaxalone, SIMPLY SALINE NA, methocarbamol, HYDROcodone-acetaminophen, and cefdinir. I am also having him start on neomycin-polymyxin-hydrocortisone and omeprazole. Additionally,  I am having him maintain his Insulin Glargine (LANTUS SOLOSTAR [place]), aspirin EC, Polyethyl Glycol-Propyl Glycol (SYSTANE OP), insulin pump, OneTouch Verio, Dexcom G6 Receiver, Dexcom G6 Sensor, Dexcom G6 Transmitter, NovoLOG FlexPen, tamsulosin, Potassium Citrate, insulin aspart, ondansetron, indapamide, Gvoke HypoPen 1-Pack, levocetirizine, candesartan, Ciclopirox, and ezetimibe.  Meds ordered this encounter  Medications   neomycin-polymyxin-hydrocortisone (CORTISPORIN) 3.5-10000-1 OTIC suspension    Sig: Place 3 drops into both ears 3 (three) times daily.    Dispense:  10 mL    Refill:  2   omeprazole (PRILOSEC) 20 MG capsule    Sig: Take 1 capsule (20 mg total) by mouth daily.    Dispense:  90 capsule    Refill:  1     Follow-up: Return in about 6 months (around 03/07/2022).  Scarlette Calico, MD

## 2021-09-07 NOTE — Patient Instructions (Signed)

## 2021-09-09 DIAGNOSIS — Z87442 Personal history of urinary calculi: Secondary | ICD-10-CM | POA: Diagnosis not present

## 2021-09-09 DIAGNOSIS — Z720 Tobacco use: Secondary | ICD-10-CM | POA: Diagnosis not present

## 2021-09-09 DIAGNOSIS — E785 Hyperlipidemia, unspecified: Secondary | ICD-10-CM | POA: Diagnosis not present

## 2021-09-09 DIAGNOSIS — E1022 Type 1 diabetes mellitus with diabetic chronic kidney disease: Secondary | ICD-10-CM | POA: Diagnosis not present

## 2021-09-09 DIAGNOSIS — Z794 Long term (current) use of insulin: Secondary | ICD-10-CM | POA: Diagnosis not present

## 2021-09-09 DIAGNOSIS — N182 Chronic kidney disease, stage 2 (mild): Secondary | ICD-10-CM | POA: Diagnosis not present

## 2021-09-15 ENCOUNTER — Ambulatory Visit (HOSPITAL_BASED_OUTPATIENT_CLINIC_OR_DEPARTMENT_OTHER): Payer: Medicare Other | Admitting: Physical Therapy

## 2021-09-16 ENCOUNTER — Ambulatory Visit (INDEPENDENT_AMBULATORY_CARE_PROVIDER_SITE_OTHER): Payer: Medicare Other | Admitting: Internal Medicine

## 2021-09-16 ENCOUNTER — Encounter: Payer: Self-pay | Admitting: Internal Medicine

## 2021-09-16 ENCOUNTER — Other Ambulatory Visit: Payer: Self-pay

## 2021-09-16 VITALS — BP 130/72 | HR 71 | Temp 98.2°F | Ht 73.0 in | Wt 252.0 lb

## 2021-09-16 DIAGNOSIS — Z720 Tobacco use: Secondary | ICD-10-CM

## 2021-09-16 DIAGNOSIS — J301 Allergic rhinitis due to pollen: Secondary | ICD-10-CM | POA: Diagnosis not present

## 2021-09-16 DIAGNOSIS — Z794 Long term (current) use of insulin: Secondary | ICD-10-CM | POA: Insufficient documentation

## 2021-09-16 DIAGNOSIS — N1831 Chronic kidney disease, stage 3a: Secondary | ICD-10-CM | POA: Insufficient documentation

## 2021-09-16 DIAGNOSIS — H6093 Unspecified otitis externa, bilateral: Secondary | ICD-10-CM | POA: Diagnosis not present

## 2021-09-16 DIAGNOSIS — H9012 Conductive hearing loss, unilateral, left ear, with unrestricted hearing on the contralateral side: Secondary | ICD-10-CM

## 2021-09-16 DIAGNOSIS — N181 Chronic kidney disease, stage 1: Secondary | ICD-10-CM | POA: Insufficient documentation

## 2021-09-16 DIAGNOSIS — M545 Low back pain, unspecified: Secondary | ICD-10-CM | POA: Insufficient documentation

## 2021-09-16 LAB — HLA-B*58:01 TYPING: HLA-B*58:01 Typing: NEGATIVE

## 2021-09-16 MED ORDER — AMOXICILLIN-POT CLAVULANATE 875-125 MG PO TABS: 1.0000 | ORAL_TABLET | Freq: Two times a day (BID) | ORAL | 0 refills | Status: DC

## 2021-09-16 NOTE — Progress Notes (Signed)
Patient ID: Anthony Kightlinger., male   DOB: Mar 24, 1951, 71 y.o.   MRN: [id number]        Chief Complaint: follow up bilateral ear pain, left hearing loss, allergies       HPI:  Anthony Daniel. is a 71 y.o. male here with c/o 4 days onset bilateral ear pain with soreness to touch mild, intermittent, nothing really makes better or worse, without fever, chills, other HA, ST, cough, and Pt denies chest pain, increased sob or doe, wheezing, orthopnea, PND, increased LE swelling, palpitations, dizziness or syncope.  Does have several wks ongoing nasal allergy symptoms with clearish congestion, itch and sneezing, without fever, pain, ST, cough, swelling or wheezing.  ALso has 2 days with reduced hearing left ear - ? Wax as well       Wt Readings from Last 3 Encounters:  09/16/21 252 lb (114.3 kg)  09/07/21 252 lb (114.3 kg)  07/13/21 250 lb (113.4 kg)   BP Readings from Last 3 Encounters:  09/16/21 130/72  09/07/21 128/80  07/13/21 (!) 121/59         Past Medical History:  Diagnosis Date   Arthritis    Back   Benign localized prostatic hyperplasia with lower urinary tract symptoms (LUTS)    CKD (chronic kidney disease), stage II    History of bladder cancer    1998--  TCC   History of kidney stones    managed by urologist--- dr Annabell Howells, dr prerminger @[facility] , and dr [email or web address]-aceves @[email or web address]    Hyperlipidemia    Hypertension    Insulin pump in place    02-02-2021 per pt currently not using pump, doing lantus insulin bid as directed by dr Sharl Ma   LADA (latent autoimmune diabetes in adults), managed as type 1 [facility])    endocrinologist-- dr Sharl Ma   PONV (postoperative nausea and vomiting)    Prostate cancer [facility])    urologist--- dr Annabell Howells--  first dx 02/ 2020 gleason 3+3, active surveillance;  bx 09-23-2020  Gleason 4+4, PSA 7.64   Spinal stenosis, cervical region    Uses self-applied continuous glucose monitoring device    DEX-COM   Past Surgical History:  Procedure Laterality Date    ANTERIOR CERVICAL DECOMP/DISCECTOMY FUSION N/A 12/15/2020   Procedure: Cervical Three-Four Cervical Four-Five Anterior cervical decompression/discectomy/fusion;  Surgeon: Maeola Harman, MD;  Location: [facility];  Service: Neurosurgery;  Laterality: N/A;   COLONOSCOPY  2018   x2   CYSTOSCOPY  02/09/2021   Procedure: CYSTOSCOPY FLEXIBLE;  Surgeon: Bjorn Pippin, MD;  Location: [facility];  Service: Urology;;   CYSTOSCOPY W/ URETERAL STENT PLACEMENT Right 05/12/2015   Procedure: CYSTOSCOPY WITH STENT REPLACEMENT;  Surgeon: Malen Gauze, MD;  Location: [facility];  Service: Urology;  Laterality: Right;   CYSTOSCOPY WITH RETROGRADE PYELOGRAM, URETEROSCOPY AND STENT PLACEMENT Right 01/23/2019   Procedure: CYSTOSCOPY WITH RIGHT RETROGRADE PYELOGRAM, URETEROSCOPY AND STENT PLACEMENT;  Surgeon: Bjorn Pippin, MD;  Location: [facility];  Service: Urology;  Laterality: Right;   CYSTOSCOPY WITH URETEROSCOPY AND STENT PLACEMENT Right 04/23/2015   Procedure: CYSTOSCOPY WITH URETEROSCOPY RETROGRADE PYELOGRAM AND STENT PLACEMENT;  Surgeon: Malen Gauze, MD;  Location: [facility];  Service: Urology;  Laterality: Right;   CYSTOSCOPY/RETROGRADE/URETEROSCOPY/STONE EXTRACTION WITH BASKET Right 05/12/2015   Procedure: CYSTOSCOPY/RETROGRADE/URETEROSCOPY/STONE EXTRACTION WITH BASKET;  Surgeon: Malen Gauze, MD;  Location[facility];  Service: Urology;  Laterality: Right;   CYSTOSCOPY/URETEROSCOPY/HOLMIUM LASER/STENT PLACEMENT Left 09/18/2018   Procedure: CYSTOSCOPY/ LEFT RETROGRADE LEFT  URETEROSCOPY /HOLMIUM LASER/STENT PLACEMENT;  Surgeon: Bjorn Pippin, MD;  [facility];  Service: Urology;  Laterality: Left;   CYSTOSCOPY/URETEROSCOPY/HOLMIUM LASER/STENT PLACEMENT Right 10/24/2019   Procedure: CYSTOSCOPY/URETEROSCOPY/HOLMIUM LASER/STENT PLACEMENT;  Surgeon: Bjorn Pippin, MD;  Location: [facility];  Service: Urology;  Laterality: Right;    CYSTOSCOPY/URETEROSCOPY/HOLMIUM LASER/STENT PLACEMENT Left 01/30/2020   @[facility]    CYSTOSCOPY/URETEROSCOPY/HOLMIUM LASER/STENT PLACEMENT Left 07/13/2021   Procedure: CYSTOSCOPY/URETEROSCOPY/HOLMIUM LASER/STENT PLACEMENT;  Surgeon: Crista Elliot, MD;  Location: [facility];  Service: Urology;  Laterality: Left;   EXTRACORPOREAL SHOCK WAVE LITHOTRIPSY  x2 in  2006//   x2  in 2012   HOLMIUM LASER APPLICATION Right 05/12/2015   Procedure: HOLMIUM LASER APPLICATION;  Surgeon: Malen Gauze, MD;  Location: [facility];  Service: Urology;  Laterality: Right;   HOLMIUM LASER APPLICATION Right 01/23/2019   Procedure: HOLMIUM LASER APPLICATION;  Surgeon: Bjorn Pippin, MD;  Location: [facility];  Service: Urology;  Laterality: Right;   IR URETERAL STENT RIGHT NEW ACCESS W/O SEP NEPHROSTOMY CATH  11/25/2019   LAPAROSCOPIC APPENDECTOMY N/A 11/07/2012   Procedure: APPENDECTOMY LAPAROSCOPIC;  Surgeon: Shelly Rubenstein, MD;  Location: [facility];  Service: General;  Laterality: N/A;   NEPHROLITHOTOMY Left 08/06/2015   Procedure: 1ST STAGE  LEFT PERCUTANEOUS NEPHROLITHOTOMY ;  Surgeon: Bjorn Pippin, MD;  Location: [facility];  Service: Urology;  Laterality: Left;   NEPHROLITHOTOMY Right 11/26/2019   Procedure: NEPHROLITHOTOMY PERCUTANEOUS;  Surgeon: Bjorn Pippin, MD;  Location: [facility];  Service: Urology;  Laterality: Right;   NEPHROLITHOTOMY Right 11/28/2019   Procedure: NEPHROLITHOTOMY PERCUTANEOUS SECOND LOOK;  Surgeon: Sebastian Ache, MD;  Location: [facility];  Service: Urology;  Laterality: Right;   RADIOACTIVE SEED IMPLANT N/A 02/09/2021   Procedure: RADIOACTIVE SEED IMPLANT/BRACHYTHERAPY IMPLANT;  Surgeon: Bjorn Pippin, MD;  Location: [facility];  Service: Urology;  Laterality: N/A;   SPACE OAR INSTILLATION N/A 02/09/2021   Procedure: SPACE OAR INSTILLATION;  Surgeon: Bjorn Pippin, MD;  Location: [facility];  Service: Urology;  Laterality: N/A;   TONSILLECTOMY  1975    TRANSURETHRAL RESECTION OF BLADDER TUMOR  1998    reports that he has been smoking cigarettes. He has a 15.00 pack-year smoking history. He has never used smokeless tobacco. He reports that he does not currently use alcohol after a past usage of about 5.0 standard drinks per week. He reports that he does not use drugs. family history includes Cancer in an other family member; Diabetes in an other family member; Hypertension in an other family member. Allergies  Allergen Reactions   Altace [Ramipril] Cough   Crestor [Rosuvastatin] Other (See Comments)    Muscle aches, cramps   Lipitor [Atorvastatin] Other (See Comments)    Muscle aches, cramps   Dilaudid [Hydromorphone] Nausea And Vomiting   Iodinated Contrast Media Itching, Nausea And Vomiting and Other (See Comments)    PT TRIED THIS TWICE.EVEN WITH PRE-MEDS AND STILL HAD NAUSEA AND ITCHING--WAS TOLD HE CAN NOT TAKE iv ANYMORE   Iodine     Betadine/iodine rash per patient   Latex Other (See Comments) and Itching    Irritation   Oxycodone Nausea Only    Pt can tolerate hydrocodone in small doses only!   Adhesive [Tape] Rash   Peanut-Containing Drug Products Anxiety and Other (See Comments)    Jittery   Current Outpatient Medications on File Prior to Visit  Medication Sig Dispense Refill   aspirin EC 81 MG tablet Take 81 mg by mouth in  the morning.     candesartan (ATACAND) 16 MG tablet TAKE 1 TABLET BY MOUTH EVERY DAY 90 tablet 0   Ciclopirox 0.77 % gel APPLY TO AFFECTED AREA TWICE A DAY 30 g 2   Continuous Blood Gluc Receiver (DEXCOM G6 RECEIVER) DEVI See admin instructions.     Continuous Blood Gluc Sensor (DEXCOM G6 SENSOR) MISC change sensor     Continuous Blood Gluc Transmit (DEXCOM G6 TRANSMITTER) MISC See admin instructions.     ezetimibe (ZETIA) 10 MG tablet TAKE 1 TABLET BY MOUTH EVERY DAY 90 tablet 1   Glucagon (GVOKE HYPOPEN 1-PACK) 1 MG/0.2ML SOAJ Inject 1 Act into the skin daily as needed (hypoglycemia). 0.2 mL 2    glucose blood (ONETOUCH VERIO) test strip 5X A DAY     indapamide (LOZOL) 1.25 MG tablet TAKE 1 TABLET BY MOUTH EVERY DAY 90 tablet 0   insulin aspart (NOVOLOG) 100 UNIT/ML injection Inject into the skin as directed. USE VIA INSULIN PUMP TO ADMINISTER UP TO 130 UNITS MAXIUM DAILY     Insulin Glargine (LANTUS SOLOSTAR [place]) Inject into the skin 2 (two) times daily. 25 UNITS IN AM AND  20--25 UNITS;  WHEN FASTING OR UNABLE TO EAT LOWER TO 18 UNITS TWICE DAILY     levocetirizine (XYZAL) 5 MG tablet TAKE 1 TABLET BY MOUTH DAILY AS NEEDED FOR ALLERGIES. 90 tablet 1   neomycin-polymyxin-hydrocortisone (CORTISPORIN) 3.5-10000-1 OTIC suspension Place 3 drops into both ears 3 (three) times daily. 10 mL 2   NOVOLOG FLEXPEN 100 UNIT/ML FlexPen Inject 12-15 Units into the skin 3 (three) times daily before meals. WHEN NOT USING INSULIN PUMP ;  WHEN FASTING OR UNABLE TO EAT ONLY WHEN NEEDED TO TREAT BLOOD SUGAR >200     omeprazole (PRILOSEC) 20 MG capsule Take 1 capsule (20 mg total) by mouth daily. 90 capsule 1   ondansetron (ZOFRAN ODT) 4 MG disintegrating tablet Take 1 tablet (4 mg total) by mouth every 8 (eight) hours as needed for nausea or vomiting. 12 tablet 0   Polyethyl Glycol-Propyl Glycol (SYSTANE OP) Place 1 drop into both eyes 3 (three) times daily as needed (dry/irritated eyes).     Potassium Citrate 15 MEQ (1620 MG) TBCR Take 1 tablet by mouth in the morning and at bedtime.     tamsulosin (FLOMAX) 0.4 MG CAPS capsule Take 0.4 mg by mouth in the morning.     Insulin Human (INSULIN PUMP) SOLN Inject 1 each into the skin continuous. Novolog insulin (Patient not taking: Reported on 09/07/2021)     No current facility-administered medications on file prior to visit.        ROS:  All others reviewed and negative.  Objective        PE:  BP 130/72 (BP Location: Left Arm, Patient Position: Sitting, Cuff Size: Large)   Pulse 71   Temp 98.2 F (36.8 C) (Oral)   Ht 6\' 1"  (1.854 m)   Wt 252 lb (114.3 kg)    SpO2 97%   BMI 33.25 kg/m                 Constitutional: Pt appears in NAD               HENT: Head: NCAT.                Right Ear: External ear normal.  Bilat tm's with mild erythema.  Max sinus areas non tender.  Pharynx with mild erythema, no exudate  Left Ear: External ear normal. Left canal with wax impaction resolved with irrigation and hearing improved                Also has bilateral canal mild red, tender, swelling without d/c or bleeding               Eyes: . Pupils are equal, round, and reactive to light. Conjunctivae and EOM are normal               Nose: without d/c or deformity               Neck: Neck supple. Gross normal ROM               Cardiovascular: Normal rate and regular rhythm.                 Pulmonary/Chest: Effort normal and breath sounds without rales or wheezing.                               Neurological: Pt is alert. At baseline orientation, motor grossly intact               Skin: Skin is warm. No rashes, no other new lesions, LE edema - none               Psychiatric: Pt behavior is normal without agitation   Micro: none  Cardiac tracings I have personally interpreted today:  none  Pertinent Radiological findings (summarize): none   Lab Results  Component Value Date   WBC 8.5 09/07/2021   HGB 13.8 09/07/2021   HCT 41.8 09/07/2021   PLT 183.0 09/07/2021   GLUCOSE 224 (H) 07/12/2021   CHOL 155 09/07/2021   TRIG 93.0 09/07/2021   HDL 43.70 09/07/2021   LDLCALC 93 09/07/2021   ALT 17 07/12/2021   AST 20 07/12/2021   NA 133 (L) 07/12/2021   K 4.1 07/12/2021   CL 98 07/12/2021   CREATININE 1.98 (H) 07/12/2021   BUN 19 07/12/2021   CO2 24 07/12/2021   TSH 0.82 09/07/2021   PSA 0.89 05/31/2021   INR 0.9 02/05/2021   HGBA1C 8.2 (H) 09/07/2021   MICROALBUR 1.4 09/07/2021   Assessment/Plan:  Anthony Daniel. is a 71 y.o. Black or African American [2] male with  has a past medical history of Arthritis, Benign localized  prostatic hyperplasia with lower urinary tract symptoms (LUTS), CKD (chronic kidney disease), stage II, History of bladder cancer, History of kidney stones, Hyperlipidemia, Hypertension, Insulin pump in place, LADA (latent autoimmune diabetes in adults), managed as type 1 (HCC), PONV (postoperative nausea and vomiting), Prostate cancer (HCC), Spinal stenosis, cervical region, and Uses self-applied continuous glucose monitoring device.  Left ear hearing loss Due to wax impaction, resolved and hearing improved with irrigation  Ceruminosis is noted.  Wax is removed by syringing and manual debridement. Instructions for home care to prevent wax buildup are given.   Bilateral otitis externa Right > left, Mild to mod, for antibx course,  to f/u any worsening symptoms or concerns  Seasonal allergic rhinitis due to pollen Mild to mod seasonal flare, for restart xyzal and add nasacort otc prn,  to f/u any worsening symptoms or concerns  Tobacco abuse Counseled to quit, pt not ready  Followup: Return if symptoms worsen or fail to improve.  Oliver Barre, MD 09/19/2021 6:55 PM [facility]  Internal Medicine

## 2021-09-16 NOTE — Patient Instructions (Signed)
Please take all new medication as prescribed - the antibiotic ? ?Please take all new medication as recommended - the OTC Nasacort twice per day for the sinuses ? ?Please continue all other medications as before, including the xyzal 5 mg per Dr Ronnald Ramp ? ?You can also take Mucinex (or it's generic off brand) for congestion, and tylenol as needed for pain. ? ?Ok also to use the OTC Debrox to help clear the left ear wax ? ?Please have the pharmacy call with any other refills you may need. ? ?Please keep your appointments with your specialists as you may have planned ? ? ? ?

## 2021-09-17 ENCOUNTER — Other Ambulatory Visit: Payer: Self-pay | Admitting: Internal Medicine

## 2021-09-17 DIAGNOSIS — N289 Disorder of kidney and ureter, unspecified: Secondary | ICD-10-CM

## 2021-09-17 DIAGNOSIS — R82998 Other abnormal findings in urine: Secondary | ICD-10-CM

## 2021-09-17 MED ORDER — ALLOPURINOL 100 MG PO TABS: 100.0000 mg | ORAL_TABLET | Freq: Every day | ORAL | 1 refills | Status: DC

## 2021-09-19 ENCOUNTER — Encounter: Payer: Self-pay | Admitting: Internal Medicine

## 2021-09-19 DIAGNOSIS — H6093 Unspecified otitis externa, bilateral: Secondary | ICD-10-CM | POA: Insufficient documentation

## 2021-09-19 DIAGNOSIS — H9192 Unspecified hearing loss, left ear: Secondary | ICD-10-CM | POA: Insufficient documentation

## 2021-09-19 NOTE — Assessment & Plan Note (Signed)
Due to wax impaction, resolved and hearing improved with irrigation ? ?Ceruminosis is noted.  Wax is removed by syringing and manual debridement. Instructions for home care to prevent wax buildup are given. ? ?

## 2021-09-19 NOTE — Assessment & Plan Note (Signed)
Right > left, Mild to mod, for antibx course,  to f/u any worsening symptoms or concerns ?

## 2021-09-19 NOTE — Assessment & Plan Note (Signed)
Counseled to quit, pt not ready 

## 2021-09-19 NOTE — Assessment & Plan Note (Signed)
Mild to mod seasonal flare, for restart xyzal and add nasacort otc prn,  to f/u any worsening symptoms or concerns ?

## 2021-09-22 ENCOUNTER — Ambulatory Visit (HOSPITAL_BASED_OUTPATIENT_CLINIC_OR_DEPARTMENT_OTHER): Payer: Medicare Other | Attending: Neurosurgery | Admitting: Physical Therapy

## 2021-09-22 ENCOUNTER — Encounter (HOSPITAL_BASED_OUTPATIENT_CLINIC_OR_DEPARTMENT_OTHER): Payer: Self-pay | Admitting: Physical Therapy

## 2021-09-22 ENCOUNTER — Other Ambulatory Visit: Payer: Self-pay

## 2021-09-22 DIAGNOSIS — M6281 Muscle weakness (generalized): Secondary | ICD-10-CM | POA: Diagnosis not present

## 2021-09-22 DIAGNOSIS — M542 Cervicalgia: Secondary | ICD-10-CM | POA: Insufficient documentation

## 2021-09-22 NOTE — Therapy (Signed)
OUTPATIENT PHYSICAL THERAPY CERVICAL TREATMENT   Patient Name: Anthony Daniel. MRN: [id number] DOB:10/02/1950, 71 y.o., male Today's Date: 09/22/2021   PT End of Session - 09/22/21 0931     Visit Number 6    Number of Visits 18    Date for PT Re-Evaluation 10/28/21    Authorization Type Medicare    PT Start Time 0930    PT Stop Time 1010    PT Time Calculation (min) 40 min    Activity Tolerance Patient tolerated treatment well;Patient limited by pain    Behavior During Therapy WFL for tasks assessed/performed                 Past Medical History:  Diagnosis Date   Arthritis    Back   Benign localized prostatic hyperplasia with lower urinary tract symptoms (LUTS)    CKD (chronic kidney disease), stage II    History of bladder cancer    1998--  TCC   History of kidney stones    managed by urologist--- dr Jeffie Pollock, dr prerminger _0 , and dr gutierrez-aceves _1    Hyperlipidemia    Hypertension    Insulin pump in place    02-02-2021 per pt currently not using pump, doing lantus insulin bid as directed by dr Buddy Duty   LADA (latent autoimmune diabetes in adults), managed as type 1 [facility])    endocrinologist-- dr Buddy Duty   PONV (postoperative nausea and vomiting)    Prostate cancer [facility])    urologist--- dr Jeffie Pollock--  first dx 02/ 2020 gleason 3+3, active surveillance;  bx 09-23-2020  Gleason 4+4, PSA 7.64   Spinal stenosis, cervical region    Uses self-applied continuous glucose monitoring device    DEX-COM   Past Surgical History:  Procedure Laterality Date   ANTERIOR CERVICAL DECOMP/DISCECTOMY FUSION N/A 12/15/2020   Procedure: Cervical Three-Four Cervical Four-Five Anterior cervical decompression/discectomy/fusion;  Surgeon: Erline Levine, MD;  Location: [place];  Service: Neurosurgery;  Laterality: N/A;   COLONOSCOPY  2018   x2   CYSTOSCOPY  02/09/2021   Procedure: CYSTOSCOPY FLEXIBLE;  Surgeon: Irine Seal, MD;  Location: [facility];  Service: Urology;;    CYSTOSCOPY W/ URETERAL STENT PLACEMENT Right 05/12/2015   Procedure: CYSTOSCOPY WITH STENT REPLACEMENT;  Surgeon: Cleon Gustin, MD;  Location[facility];  Service: Urology;  Laterality: Right;   CYSTOSCOPY WITH RETROGRADE PYELOGRAM, URETEROSCOPY AND STENT PLACEMENT Right 01/23/2019   Procedure: CYSTOSCOPY WITH RIGHT RETROGRADE PYELOGRAM, URETEROSCOPY AND STENT PLACEMENT;  Surgeon: Irine Seal, MD;  [facility];  Service: Urology;  Laterality: Right;   CYSTOSCOPY WITH URETEROSCOPY AND STENT PLACEMENT Right 04/23/2015   Procedure: CYSTOSCOPY WITH URETEROSCOPY RETROGRADE PYELOGRAM AND STENT PLACEMENT;  Surgeon: Cleon Gustin, MD;  [facility];  Service: Urology;  Laterality: Right;   CYSTOSCOPY/RETROGRADE/URETEROSCOPY/STONE EXTRACTION WITH BASKET Right 05/12/2015   Procedure: CYSTOSCOPY/RETROGRADE/URETEROSCOPY/STONE EXTRACTION WITH BASKET;  Surgeon: Cleon Gustin, MD;  Location: [facility];  Service: Urology;  Laterality: Right;   CYSTOSCOPY/URETEROSCOPY/HOLMIUM LASER/STENT PLACEMENT Left 09/18/2018   Procedure: CYSTOSCOPY/ LEFT RETROGRADE LEFT URETEROSCOPY [place] LASER/STENT PLACEMENT;  Surgeon: Irine Seal, MD;  Location: [facility];  Service: Urology;  Laterality: Left;   CYSTOSCOPY/URETEROSCOPY/HOLMIUM LASER/STENT PLACEMENT Right 10/24/2019   Procedure: CYSTOSCOPY/URETEROSCOPY/HOLMIUM LASER/STENT PLACEMENT;  Surgeon: Irine Seal, MD;  [facility];  Service: Urology;  Laterality: Right;   CYSTOSCOPY/URETEROSCOPY/HOLMIUM LASER/STENT PLACEMENT Left 01/30/2020   _2    CYSTOSCOPY/URETEROSCOPY/HOLMIUM LASER/STENT PLACEMENT Left 07/13/2021   Procedure: CYSTOSCOPY/URETEROSCOPY/HOLMIUM LASER/STENT PLACEMENT;  Surgeon: Lucas Mallow, MD;  [facility];  Service: Urology;  Laterality: Left;   EXTRACORPOREAL SHOCK WAVE LITHOTRIPSY  x2 in  2006//   x2  in 2012   HOLMIUM LASER APPLICATION Right 18/59/0931    Procedure: HOLMIUM LASER APPLICATION;  Surgeon: Cleon Gustin, MD;  Location: [facility];  Service: Urology;  Laterality: Right;   HOLMIUM LASER APPLICATION Right 07/02/2445   Procedure: HOLMIUM LASER APPLICATION;  Surgeon: Irine Seal, MD;  [facility];  Service: Urology;  Laterality: Right;   IR URETERAL STENT RIGHT NEW ACCESS W/O SEP NEPHROSTOMY CATH  11/25/2019   LAPAROSCOPIC APPENDECTOMY N/A 11/07/2012   Procedure: APPENDECTOMY LAPAROSCOPIC;  Surgeon: Harl Bowie, MD;  Location: [place];  Service: General;  Laterality: N/A;   NEPHROLITHOTOMY Left 08/06/2015   Procedure: 1ST STAGE  LEFT PERCUTANEOUS NEPHROLITHOTOMY ;  Surgeon: Irine Seal, MD;  [facility];  Service: Urology;  Laterality: Left;   NEPHROLITHOTOMY Right 11/26/2019   Procedure: NEPHROLITHOTOMY PERCUTANEOUS;  Surgeon: Irine Seal, MD;  [facility];  Service: Urology;  Laterality: Right;   NEPHROLITHOTOMY Right 11/28/2019   Procedure: NEPHROLITHOTOMY PERCUTANEOUS SECOND LOOK;  Surgeon: Alexis Frock, MD;  [facility];  Service: Urology;  Laterality: Right;   RADIOACTIVE SEED IMPLANT N/A 02/09/2021   Procedure: RADIOACTIVE SEED IMPLANT/BRACHYTHERAPY IMPLANT;  Surgeon: Irine Seal, MD;  Location: [facility];  Service: Urology;  Laterality: N/A;   SPACE OAR INSTILLATION N/A 02/09/2021   Procedure: SPACE OAR INSTILLATION;  Surgeon: Irine Seal, MD;  Location: [facility];  Service: Urology;  Laterality: N/A;   TONSILLECTOMY  1975   TRANSURETHRAL RESECTION OF BLADDER TUMOR  1998   Patient Active Problem List   Diagnosis Date Noted   Left ear hearing loss 09/19/2021   Bilateral otitis externa 09/19/2021   Chronic kidney disease, stage 1 09/16/2021   Chronic kidney disease, stage 3a ([place]) 09/16/2021   Long term (current) use of insulin ([place]) 09/16/2021   Low back pain 09/16/2021   Uric acid crystalluria 09/07/2021   Encounter for general adult medical  examination with abnormal findings 09/07/2021   Chronic eczematous otitis externa of both ears 09/07/2021   Gastroesophageal reflux disease without esophagitis 09/07/2021   Cervical stenosis of spinal canal 12/15/2020   Gouty renal disease 05/13/2020   ED (erectile dysfunction) of organic origin 01/15/2020   Right kidney stone 11/26/2019   Diuretic-induced hypokalemia 08/29/2019   Prostate cancer ([place]) 09/06/2018   Tobacco abuse 07/17/2018   Essential hypertension 03/07/2018   Seasonal allergic rhinitis due to pollen 09/26/2017   Sleep apnea in adult 06/24/2015   DDD (degenerative disc disease), cervical 11/10/2014   Background diabetic retinopathy ([place]) 08/05/2013   Hyperlipidemia with target LDL less than 100 11/05/2012   LADA (latent autoimmune diabetes in adults), managed as type 1 ([place]) 06/26/2012    PCP: Janith Lima, MD  REFERRING PROVIDER: Janith Lima, MD  REFERRING DIAG: M54.2 (ICD-10-CM) - Cervicalgia  THERAPY DIAG:  Cervicalgia  Muscle weakness (generalized)  ONSET DATE: 2021  SUBJECTIVE:  SUBJECTIVE STATEMENT: Pt states he worked in the yard over the weekend the R side of the neck is stiffner and more sore today.   PERTINENT HISTORY:  Cervical fusion C3-C5- May/July 2022, DDD, HTN, HLD  PAIN:  Are you having pain? Yes NPRS scale: 2/10 R UT, aching sore  PRECAUTIONS: None  WEIGHT BEARING RESTRICTIONS No   PATIENT GOALS : Pt would like strengthen the neck and upper quarter. Pt would like to also improve his balance.   OBJECTIVE:   DIAGNOSTIC FINDINGS:  FINDINGS: 1st cross-table lateral view demonstrates anterior localizing instrument directed at the C4-5 level.   Second lateral intraoperative image demonstrates 2 level anterior fusion from  C3-C5. C5 vertebral body not well visualized due to overlying shoulders.   IMPRESSION: Intraoperative imaging as above.  PATIENT SURVEYS:  FOTO 51 58 at D/C  6th VISIT  53pts    TODAY'S TREATMENT:   STM: R C/S paraspinals and UT  Exercises  UBE L1 3 min retro and fwd 2lb OHP 2x8 neutral palm  Standing row and shoulder ext blue TB 3x10 Wall push up 3x10   PATIENT EDUCATION:  Education details: exercise progression, DOMS expectations, muscle firing,  envelope of function, HEP, POC  Person educated: Patient Education method: Explanation, Demonstration, Tactile cues, Verbal cues, and Handouts Education comprehension: verbalized understanding, returned demonstration, verbal cues required, and tactile cues required   HOME EXERCISE PROGRAM: Access Code: [email or web address] URL: [email or web address] Date: 09/22/2021 Prepared by: Daleen Bo  Exercises Seated Cervical Retraction - 2 x daily - 7 x weekly - 2 sets - 10 reps Standing blue TB Shoulder Row with Anchored Resistance - 1 x daily - 3-4 x weekly - 3 sets - 10 reps Standing Shoulder External Rotation with Resistance - 1 x daily - 3-4 x weekly - 2 sets - 10 reps Wall Push Up - 1 x daily - 3-4 x weekly - 2 sets - 10 reps Shoulder Overhead Press in Flexion with Dumbbells - 1 x daily - 3-4 x weekly - 3 sets - 10 reps     ASSESSMENT:  CLINICAL IMPRESSION: Pt presents today with increase R sided neck and shoulder hypertonicity that is likely due to report of increased OH activity with yardwork. Pt did feel relief of stiffness in upper quarter following manual therapy and exercise. Pt HEP updated to include more exercise above shoulder level in order to progress endurance and strength at higher degrees of flexion of ABD. Plan to continue with decreased frequency of visits for taper towards more independent management. Pt's FOTO score today is affected by recent soreness according to pt.   Objective impairments include  decreased ROM, decreased strength, hypomobility, increased fascial restrictions, increased muscle spasms, impaired flexibility, impaired UE functional use, improper body mechanics, postural dysfunction, and pain. These impairments are limiting patient from cleaning, community activity, driving, meal prep, laundry, yard work, shopping, and exercise/recreation . Personal factors including Age, Behavior pattern, Past/current experiences, Time since onset of injury/illness/exacerbation, and 1-2 comorbidities:    are also affecting patient's functional outcome. Patient will benefit from skilled PT to address above impairments and improve overall function.  REHAB POTENTIAL: Fair    CLINICAL DECISION MAKING: Evolving/moderate complexity  EVALUATION COMPLEXITY: Moderate   GOALS:   SHORT TERM GOALS:  STG Name Target Date Goal status  1 Pt will become independent with HEP in order to demonstrate synthesis of PT education.  11/03/2021 MET  2 Pt will be able to demonstrate OH ROM and reach in order to demonstrate  functional improvement in UE function for self-care and house hold duties.  11/03/2021 INITIAL  3 Pt will be able to demonstrate at least 20% increase in cervical rotation in order to demonstrate functional improvement in C/S function for self-care in driving tasks.   11/03/2021 INITIAL   LONG TERM GOALS:   LTG Name Target Date Goal status  1 Pt  will become independent with final HEP in order to demonstrate synthesis of PT education.  12/15/2021 INITIAL  2 Pt will be able to reach [facility] and carry/hold >5 lbs in order to demonstrate functional improvement in UE strength for return to PLOF and exercise.   12/15/2021 INITIAL  3 Pt will be able to demonstrate ability to stand/turn/pivot without LOB in order to demonstrate functional improvement in balance function for community and household mobility.  12/15/2021 INITIAL  4 Pt will score >/= 58 on FOTO to demonstrate improvement in perceived cervical  function.  12/15/2021 INITIAL   PLAN: PT FREQUENCY: 1-2x/week  PT DURATION: 12 weeks (likely d/c by 8)  PLANNED INTERVENTIONS: Therapeutic exercises, Therapeutic activity, Neuro Muscular re-education, Balance training, Gait training, Patient/Family education, and Joint mobilization  PLAN FOR NEXT SESSION: review HEP, STM, shoulder ROM and strength Daleen Bo PT, DPT 09/22/21 10:20 AM

## 2021-09-29 ENCOUNTER — Encounter (HOSPITAL_BASED_OUTPATIENT_CLINIC_OR_DEPARTMENT_OTHER): Payer: BC Managed Care – PPO | Admitting: Physical Therapy

## 2021-10-06 ENCOUNTER — Encounter (HOSPITAL_BASED_OUTPATIENT_CLINIC_OR_DEPARTMENT_OTHER): Payer: Self-pay | Admitting: Physical Therapy

## 2021-10-06 ENCOUNTER — Other Ambulatory Visit: Payer: Self-pay

## 2021-10-06 ENCOUNTER — Ambulatory Visit (HOSPITAL_BASED_OUTPATIENT_CLINIC_OR_DEPARTMENT_OTHER): Payer: Medicare Other | Admitting: Physical Therapy

## 2021-10-06 DIAGNOSIS — M542 Cervicalgia: Secondary | ICD-10-CM | POA: Diagnosis not present

## 2021-10-06 DIAGNOSIS — M6281 Muscle weakness (generalized): Secondary | ICD-10-CM

## 2021-10-06 NOTE — Therapy (Signed)
?OUTPATIENT PHYSICAL THERAPY CERVICAL TREATMENT ? ? ?Patient Name: Anthony Daniel. ?MRN: [id number] ?DOB:1950/10/11, 71 y.o., male ?Today's Date: 10/06/2021 ? ? PT End of Session - 10/06/21 1115   ? ? Visit Number 7   ? Number of Visits 18   ? Date for PT Re-Evaluation 10/28/21   ? Authorization Type Medicare   ? PT Start Time 1100   ? PT Stop Time 2263   ? PT Time Calculation (min) 38 min   ? Activity Tolerance Patient tolerated treatment well;Patient limited by pain   ? Behavior During Therapy [facility] for tasks assessed/performed   ? ?  ?  ? ?  ? ? ? ? ? ? ? ?Past Medical History:  ?Diagnosis Date  ? Arthritis   ? Back  ? Benign localized prostatic hyperplasia with lower urinary tract symptoms (LUTS)   ? CKD (chronic kidney disease), stage II   ? History of bladder cancer   ? 1998--  TCC  ? History of kidney stones   ? managed by urologist--- dr Jeffie Pollock, dr prerminger '@[facility] , and dr gutierrez-aceves '@[email or web address]   ? Hyperlipidemia   ? Hypertension   ? Insulin pump in place   ? 02-02-2021 per pt currently not using pump, doing lantus insulin bid as directed by dr Buddy Duty  ? LADA (latent autoimmune diabetes in adults), managed as type 1 ([place])   ? endocrinologist-- dr Buddy Duty  ? PONV (postoperative nausea and vomiting)   ? Prostate cancer ([place])   ? urologist--- dr Jeffie Pollock--  first dx 02/ 2020 gleason 3+3, active surveillance;  bx 09-23-2020  Gleason 4+4, PSA 7.64  ? Spinal stenosis, cervical region   ? Uses self-applied continuous glucose monitoring device   ? DEX-COM  ? ?Past Surgical History:  ?Procedure Laterality Date  ? ANTERIOR CERVICAL DECOMP/DISCECTOMY FUSION N/A 12/15/2020  ? Procedure: Cervical Three-Four Cervical Four-Five Anterior cervical decompression/discectomy/fusion;  Surgeon: Erline Levine, MD;  Location: [place];  Service: Neurosurgery;  Laterality: N/A;  ? COLONOSCOPY  2018  ? x2  ? CYSTOSCOPY  02/09/2021  ? Procedure: CYSTOSCOPY FLEXIBLE;  Surgeon: Irine Seal, MD;  Location: [facility];  Service:  Urology;;  ? [place] Right 05/12/2015  ? Procedure: CYSTOSCOPY WITH STENT REPLACEMENT;  Surgeon: Cleon Gustin, MD;  Location: [facility];  Service: Urology;  Laterality: Right;  ? CYSTOSCOPY WITH RETROGRADE PYELOGRAM, URETEROSCOPY AND STENT PLACEMENT Right 01/23/2019  ? Procedure: CYSTOSCOPY WITH RIGHT RETROGRADE PYELOGRAM, URETEROSCOPY AND STENT PLACEMENT;  Surgeon: Irine Seal, MD;  [facility];  Service: Urology;  Laterality: Right;  ? CYSTOSCOPY WITH URETEROSCOPY AND STENT PLACEMENT Right 04/23/2015  ? Procedure: CYSTOSCOPY WITH URETEROSCOPY RETROGRADE PYELOGRAM AND STENT PLACEMENT;  Surgeon: Cleon Gustin, MD;  [facility];  Service: Urology;  Laterality: Right;  ? CYSTOSCOPY/RETROGRADE/URETEROSCOPY/STONE EXTRACTION WITH BASKET Right 05/12/2015  ? Procedure: CYSTOSCOPY/RETROGRADE/URETEROSCOPY/STONE EXTRACTION WITH BASKET;  Surgeon: Cleon Gustin, MD;  Location[facility];  Service: Urology;  Laterality: Right;  ? CYSTOSCOPY/URETEROSCOPY/HOLMIUM LASER/STENT PLACEMENT Left 09/18/2018  ? Procedure: CYSTOSCOPY/ LEFT RETROGRADE LEFT URETEROSCOPY [place] LASER/STENT PLACEMENT;  Surgeon: Irine Seal, MD;  Location: [facility];  Service: Urology;  Laterality: Left;  ? CYSTOSCOPY/URETEROSCOPY/HOLMIUM LASER/STENT PLACEMENT Right 10/24/2019  ? Procedure: CYSTOSCOPY/URETEROSCOPY/HOLMIUM LASER/STENT PLACEMENT;  Surgeon: Irine Seal, MD;  [facility];  Service: Urology;  Laterality: Right;  ? CYSTOSCOPY/URETEROSCOPY/HOLMIUM LASER/STENT PLACEMENT Left 01/30/2020  ? '@[facility]   ? CYSTOSCOPY/URETEROSCOPY/HOLMIUM LASER/STENT PLACEMENT Left 07/13/2021  ? Procedure: CYSTOSCOPY/URETEROSCOPY/HOLMIUM LASER/STENT PLACEMENT;  Surgeon: Lucas Mallow, MD;  [facility];  Service: Urology;  Laterality: Left;  ? EXTRACORPOREAL SHOCK WAVE LITHOTRIPSY  x2 in  2006//   x2  in 2012  ? HOLMIUM LASER APPLICATION Right 62/37/6283  ?  Procedure: HOLMIUM LASER APPLICATION;  Surgeon: Cleon Gustin, MD;  [facility];  Service: Urology;  Laterality: Right;  ? HOLMIUM LASER APPLICATION Right 15/17/6160  ? Procedure: HOLMIUM LASER APPLICATION;  Surgeon: Irine Seal, MD;  [facility];  Service: Urology;  Laterality: Right;  ? IR URETERAL STENT RIGHT NEW ACCESS W/O SEP NEPHROSTOMY CATH  11/25/2019  ? LAPAROSCOPIC APPENDECTOMY N/A 11/07/2012  ? Procedure: APPENDECTOMY LAPAROSCOPIC;  Surgeon: Harl Bowie, MD;  Location: [place];  Service: General;  Laterality: N/A;  ? NEPHROLITHOTOMY Left 08/06/2015  ? Procedure: 1ST STAGE  LEFT PERCUTANEOUS NEPHROLITHOTOMY ;  Surgeon: Irine Seal, MD;  [facility];  Service: Urology;  Laterality: Left;  ? NEPHROLITHOTOMY Right 11/26/2019  ? Procedure: NEPHROLITHOTOMY PERCUTANEOUS;  Surgeon: Irine Seal, MD;  [facility];  Service: Urology;  Laterality: Right;  ? NEPHROLITHOTOMY Right 11/28/2019  ? Procedure: NEPHROLITHOTOMY PERCUTANEOUS SECOND LOOK;  Surgeon: Alexis Frock, MD;  [facility];  Service: Urology;  Laterality: Right;  ? RADIOACTIVE SEED IMPLANT N/A 02/09/2021  ? Procedure: RADIOACTIVE SEED IMPLANT/BRACHYTHERAPY IMPLANT;  Surgeon: Irine Seal, MD;  Location[facility];  Service: Urology;  Laterality: N/A;  ? SPACE OAR INSTILLATION N/A 02/09/2021  ? Procedure: SPACE OAR INSTILLATION;  Surgeon: Irine Seal, MD;  Location: [facility];  Service: Urology;  Laterality: N/A;  ? TONSILLECTOMY  1975  ? TRANSURETHRAL RESECTION OF BLADDER TUMOR  1998  ? ?Patient Active Problem List  ? Diagnosis Date Noted  ? Left ear hearing loss 09/19/2021  ? Bilateral otitis externa 09/19/2021  ? Chronic kidney disease, stage 1 09/16/2021  ? Chronic kidney disease, stage 3a ([place]) 09/16/2021  ? Long term (current) use of insulin ([place]) 09/16/2021  ? Low back pain 09/16/2021  ? Uric acid crystalluria 09/07/2021  ? Encounter for general adult medical  examination with abnormal findings 09/07/2021  ? Chronic eczematous otitis externa of both ears 09/07/2021  ? Gastroesophageal reflux disease without esophagitis 09/07/2021  ? Cervical stenosis of spinal canal 12/15/2020  ? Gouty renal disease 05/13/2020  ? ED (erectile dysfunction) of organic origin 01/15/2020  ? Right kidney stone 11/26/2019  ? Diuretic-induced hypokalemia 08/29/2019  ? Prostate cancer ([place]) 09/06/2018  ? Tobacco abuse 07/17/2018  ? Essential hypertension 03/07/2018  ? Seasonal allergic rhinitis due to pollen 09/26/2017  ? Sleep apnea in adult 06/24/2015  ? DDD (degenerative disc disease), cervical 11/10/2014  ? Background diabetic retinopathy ([place]) 08/05/2013  ? Hyperlipidemia with target LDL less than 100 11/05/2012  ? LADA (latent autoimmune diabetes in adults), managed as type 1 ([place]) 06/26/2012  ? ? ?PCP: Janith Lima, MD ? ?REFERRING PROVIDER: Janith Lima, MD ? ?REFERRING DIAG: M54.2 (ICD-10-CM) - Cervicalgia ? ?THERAPY DIAG:  ?Cervicalgia ? ?Muscle weakness (generalized) ? ?ONSET DATE: 2021 ? ?SUBJECTIVE:                                                                                                                                                                                                        ? ?  SUBJECTIVE STATEMENT: ?Pt states the neck feels better. He no longer has pain at rest. He has been continuing to do housework/spring cleaning at his parents home and did not have pain after.   ? ?PERTINENT HISTORY:  ?Cervical fusion C3-C5- May/July 2022, DDD, HTN, HLD ? ?PAIN:  ?Are you having pain? No ?NPRS scale: 0/10 ?R UT, aching sore ? ?PRECAUTIONS: None ? ?WEIGHT BEARING RESTRICTIONS No ? ? ?PATIENT GOALS : Pt would like strengthen the neck and upper quarter. Pt would like to also improve his balance.  ? ?OBJECTIVE:  ? ?DIAGNOSTIC FINDINGS:  ?FINDINGS: ?1st cross-table lateral view demonstrates anterior localizing ?instrument directed at the C4-5 level. ?  ?Second lateral  intraoperative image demonstrates 2 level anterior ?fusion from C3-C5. C5 vertebral body not well visualized due to ?overlying shoulders. ?  ?IMPRESSION: ?Intraoperative imaging as above. ? ?PATIENT SURVEYS:  ?FOTO

## 2021-10-13 DIAGNOSIS — Z87442 Personal history of urinary calculi: Secondary | ICD-10-CM | POA: Diagnosis not present

## 2021-10-13 DIAGNOSIS — N200 Calculus of kidney: Secondary | ICD-10-CM | POA: Diagnosis not present

## 2021-10-13 DIAGNOSIS — Z79899 Other long term (current) drug therapy: Secondary | ICD-10-CM | POA: Diagnosis not present

## 2021-10-13 DIAGNOSIS — R82993 Hyperuricosuria: Secondary | ICD-10-CM | POA: Diagnosis not present

## 2021-10-13 DIAGNOSIS — M109 Gout, unspecified: Secondary | ICD-10-CM | POA: Diagnosis not present

## 2021-10-18 ENCOUNTER — Other Ambulatory Visit: Payer: Self-pay | Admitting: Internal Medicine

## 2021-10-18 DIAGNOSIS — E139 Other specified diabetes mellitus without complications: Secondary | ICD-10-CM

## 2021-10-18 DIAGNOSIS — I10 Essential (primary) hypertension: Secondary | ICD-10-CM

## 2021-10-20 ENCOUNTER — Encounter: Payer: Medicare Other | Attending: Urology | Admitting: Dietician

## 2021-10-20 ENCOUNTER — Encounter: Payer: Self-pay | Admitting: Dietician

## 2021-10-20 DIAGNOSIS — Z713 Dietary counseling and surveillance: Secondary | ICD-10-CM | POA: Diagnosis not present

## 2021-10-20 DIAGNOSIS — E119 Type 2 diabetes mellitus without complications: Secondary | ICD-10-CM | POA: Insufficient documentation

## 2021-10-20 DIAGNOSIS — E139 Other specified diabetes mellitus without complications: Secondary | ICD-10-CM

## 2021-10-20 NOTE — Patient Instructions (Addendum)
Work on reducing your consumption of red meat and pork and focus on more lean poultry and seafood as well as plant sources of protein such as beans, black eyed peas, lentils, lima beans, etc. ? ?Work on Social worker intake. Keep some frozen vegetables in your freezer, try "Steam In Bag" vegetables, be mindful of frozen veggies that say "Seasoned" or "In Sauce/Butter" ? ?Try adding in a bottle of alkaline water each day. ? ?Keep up the great job increasing your water intake, you are very close to your goal of 70 oz a day. ? ?Use the divided plates that you have to prepare balanced meals. Pay attention to your portions of carbohydrates using this method to make sure your meal-time injections are appropriate. ?

## 2021-10-20 NOTE — Progress Notes (Signed)
Medical Nutrition Therapy  ?Appointment Start time:  [phone]  Appointment End time:  1010 ? ?Primary concerns today: Kidney stone nutrition therapy  ?Referral diagnosis: E11.8 - Type 2 Diabetes Mellitus with unspecified complications ?Preferred learning style: Visual ?Learning readiness: Ready ? ? ?NUTRITION ASSESSMENT  ? ?Anthropometrics  ?None taken ? ?Clinical ?Medical Hx: LADA DM, Cholestasias, HTN, Prostate cancer ?Medications: Humalog (pump), Novolog (when not using pump), Dexcom G6, Lantus, Zetia, Flomax, Potassium Citrate ?Labs: NEW: A1c 8.2 (up from 7.9 in 5/22) ?Urology: Urine Volume - Low, Urine Oxalate - High, Urine Sodium - High, Urine Magnesium - High, Uric Acid - High ?Notable Signs/Symptoms: N/A ? ?Lifestyle & Dietary Hx ?Pt reports their urologist increased their potassium citrate due to the acidity in their urine being too high. Pt states that their urine oxalate levels weren't very elevated, wants to focus on lowering uric acid instead.  ?Pt will be getting current kidney stones removed in May, and have their kidneys  checked for anymore potential formations. Pt is looking forward to having a "clean slate" to see how their diet changes are working to lower stoner formation, ?Pt states that they are still doing MDI, not ready to switch back to G6/Tandem. ?Pt reports eating more fruit now like apples, honeydew, cantaloupe, watermelon. ?Pt states that they bought a 32 oz water bottle and has been able to drink about 1.5 a day. Pt has been drinking drinking more lemon water as well. Pt has increased water significantly. ? ? ?Estimated daily fluid intake: 64 oz ?Supplements: N/A ?Sleep: OSA ?Stress / self-care: Stressed about DM and kidney stones. ?Current average weekly physical activity: ADLs, walks 3-4 days for 30 minutes ? ? ?24-Hr Dietary Recall ?First Meal: 2 eggs, 2 slices of bacon, 1 slice of toast, 24 oz. water ?Snack:  ?Second Meal: BBQ sandwich, slaw, water ?Snack: none ?Third Meal: Salad,  Shrimp, 2 slices of bread w/ olive oil, 24 oz. water ?Snack: none ?Beverages: water ? ? ?NUTRITION DIAGNOSIS  ?NB-1.1 Food and nutrition-related knowledge deficit As related to kidney stones.  As evidenced by history of both uric and calcium oxalate cholestasias, low fluid intake, and diet high in purines and oxalates. . ? ? ?NUTRITION INTERVENTION  ?Nutrition education (E-1) on the following topics:  ?Educated patient on the importance of beginning to identify foods in their diet that are high sources of oxalates, and strategies to lower their consumption of oxalates. Educated patient on the role of animal based protein on uric acid levels. Educated patient on appropriate serving sized of protein choices. Advised patient to increase their water intake to a minimum of 64 oz each day. ? ? ?Handouts Provided Include  ?Kidney Stone Nutrition Therapy Nutrition Care Manual ?High Oxalate Food List ?ADA: How to Thrive with Diabetes ?NEW: Low Purine/Purine-Restricted Nutrition Therapy Nutrition Care Manual ? ? ?Learning Style & Readiness for Change ?Teaching method utilized: Visual & Auditory  ?Demonstrated degree of understanding via: Teach Back  ?Barriers to learning/adherence to lifestyle change: None ? ? ?Goals Established by Pt ?Work on reducing your consumption of red meat and pork and focus on more lean poultry and seafood as well as plant sources of protein such as beans, black eyed peas, lentils, lima beans, etc. ?Work on Social worker intake. Keep some frozen vegetables in your freezer, try "Steam In Bag" vegetables, be mindful of frozen veggies that say "Seasoned" or "In Sauce/Butter" ?Try adding in a bottle of alkaline water each day. ?Keep up the great job increasing your  water intake, you are very close to your goal of 70 oz a day. ?Use the divided plates that you have to prepare balanced meals. Pay attention to your portions of carbohydrates using this method to make sure your meal-time injections  are appropriate. ? ? ?MONITORING & EVALUATION ?Dietary intake, weekly physical activity, and oxalate and purine intake in 8 weeks. ? ?Next Steps  ?Patient is to follow up with RD. ? ?

## 2021-10-21 ENCOUNTER — Ambulatory Visit (HOSPITAL_BASED_OUTPATIENT_CLINIC_OR_DEPARTMENT_OTHER): Payer: Medicare Other | Admitting: Physical Therapy

## 2021-11-01 DIAGNOSIS — E119 Type 2 diabetes mellitus without complications: Secondary | ICD-10-CM | POA: Diagnosis not present

## 2021-11-01 DIAGNOSIS — Z713 Dietary counseling and surveillance: Secondary | ICD-10-CM | POA: Diagnosis not present

## 2021-11-01 DIAGNOSIS — Z6832 Body mass index (BMI) 32.0-32.9, adult: Secondary | ICD-10-CM | POA: Diagnosis not present

## 2021-11-01 DIAGNOSIS — E669 Obesity, unspecified: Secondary | ICD-10-CM | POA: Diagnosis not present

## 2021-11-02 DIAGNOSIS — D1039 Benign neoplasm of other parts of mouth: Secondary | ICD-10-CM | POA: Diagnosis not present

## 2021-11-04 ENCOUNTER — Ambulatory Visit (HOSPITAL_BASED_OUTPATIENT_CLINIC_OR_DEPARTMENT_OTHER): Payer: Medicare Other | Attending: Neurosurgery | Admitting: Physical Therapy

## 2021-11-04 ENCOUNTER — Encounter (HOSPITAL_BASED_OUTPATIENT_CLINIC_OR_DEPARTMENT_OTHER): Payer: Self-pay | Admitting: Physical Therapy

## 2021-11-04 ENCOUNTER — Other Ambulatory Visit: Payer: Self-pay | Admitting: Internal Medicine

## 2021-11-04 DIAGNOSIS — R2689 Other abnormalities of gait and mobility: Secondary | ICD-10-CM | POA: Diagnosis not present

## 2021-11-04 DIAGNOSIS — M6281 Muscle weakness (generalized): Secondary | ICD-10-CM | POA: Insufficient documentation

## 2021-11-04 DIAGNOSIS — M542 Cervicalgia: Secondary | ICD-10-CM | POA: Diagnosis not present

## 2021-11-04 DIAGNOSIS — E139 Other specified diabetes mellitus without complications: Secondary | ICD-10-CM

## 2021-11-04 NOTE — Therapy (Signed)
?OUTPATIENT PHYSICAL THERAPY RE-CERTIFICATION ? ?Patient Name: Anthony Daniel. ?MRN: [id number] ?DOB:01-Mar-1951, 71 y.o., male ?Today's Date: 11/04/2021 ? ? PT End of Session - 11/04/21 7867   ? ? Visit Number 8   ? Number of Visits 18   ? Date for PT Re-Evaluation 02/02/22   ? Authorization Type Medicare   ? PT Start Time 0930   ? PT Stop Time 1010   ? PT Time Calculation (min) 40 min   ? Activity Tolerance Patient tolerated treatment well;Patient limited by pain   ? Behavior During Therapy [facility] for tasks assessed/performed   ? ?  ?  ? ?  ? ? ? ? ? ? ? ? ?Past Medical History:  ?Diagnosis Date  ? Arthritis   ? Back  ? Benign localized prostatic hyperplasia with lower urinary tract symptoms (LUTS)   ? CKD (chronic kidney disease), stage II   ? History of bladder cancer   ? 1998--  TCC  ? History of kidney stones   ? managed by urologist--- dr Jeffie Pollock, dr prerminger [facility] , and dr gutierrez-aceves '@[facility]   ? Hyperlipidemia   ? Hypertension   ? Insulin pump in place   ? 02-02-2021 per pt currently not using pump, doing lantus insulin bid as directed by dr Buddy Duty  ? LADA (latent autoimmune diabetes in adults), managed as type 1 ([place])   ? endocrinologist-- dr Buddy Duty  ? PONV (postoperative nausea and vomiting)   ? Prostate cancer ([place])   ? urologist--- dr Jeffie Pollock--  first dx 02/ 2020 gleason 3+3, active surveillance;  bx 09-23-2020  Gleason 4+4, PSA 7.64  ? Spinal stenosis, cervical region   ? Uses self-applied continuous glucose monitoring device   ? DEX-COM  ? ?Past Surgical History:  ?Procedure Laterality Date  ? ANTERIOR CERVICAL DECOMP/DISCECTOMY FUSION N/A 12/15/2020  ? Procedure: Cervical Three-Four Cervical Four-Five Anterior cervical decompression/discectomy/fusion;  Surgeon: Erline Levine, MD;  Location: [place];  Service: Neurosurgery;  Laterality: N/A;  ? COLONOSCOPY  2018  ? x2  ? CYSTOSCOPY  02/09/2021  ? Procedure: CYSTOSCOPY FLEXIBLE;  Surgeon: Irine Seal, MD;  Location: [facility];  Service:  Urology;;  ? [place] Right 05/12/2015  ? Procedure: CYSTOSCOPY WITH STENT REPLACEMENT;  Surgeon: Cleon Gustin, MD;  Location: [facility];  Service: Urology;  Laterality: Right;  ? CYSTOSCOPY WITH RETROGRADE PYELOGRAM, URETEROSCOPY AND STENT PLACEMENT Right 01/23/2019  ? Procedure: CYSTOSCOPY WITH RIGHT RETROGRADE PYELOGRAM, URETEROSCOPY AND STENT PLACEMENT;  Surgeon: Irine Seal, MD;  [facility];  Service: Urology;  Laterality: Right;  ? CYSTOSCOPY WITH URETEROSCOPY AND STENT PLACEMENT Right 04/23/2015  ? Procedure: CYSTOSCOPY WITH URETEROSCOPY RETROGRADE PYELOGRAM AND STENT PLACEMENT;  Surgeon: Cleon Gustin, MD;  [facility];  Service: Urology;  Laterality: Right;  ? CYSTOSCOPY/RETROGRADE/URETEROSCOPY/STONE EXTRACTION WITH BASKET Right 05/12/2015  ? Procedure: CYSTOSCOPY/RETROGRADE/URETEROSCOPY/STONE EXTRACTION WITH BASKET;  Surgeon: Cleon Gustin, MD;  Location: [facility];  Service: Urology;  Laterality: Right;  ? CYSTOSCOPY/URETEROSCOPY/HOLMIUM LASER/STENT PLACEMENT Left 09/18/2018  ? Procedure: CYSTOSCOPY/ LEFT RETROGRADE LEFT URETEROSCOPY [place] LASER/STENT PLACEMENT;  Surgeon: Irine Seal, MD;  Location: [facility];  Service: Urology;  Laterality: Left;  ? CYSTOSCOPY/URETEROSCOPY/HOLMIUM LASER/STENT PLACEMENT Right 10/24/2019  ? Procedure: CYSTOSCOPY/URETEROSCOPY/HOLMIUM LASER/STENT PLACEMENT;  Surgeon: Irine Seal, MD;  [facility];  Service: Urology;  Laterality: Right;  ? CYSTOSCOPY/URETEROSCOPY/HOLMIUM LASER/STENT PLACEMENT Left 01/30/2020  ? '@[facility]   ? CYSTOSCOPY/URETEROSCOPY/HOLMIUM LASER/STENT PLACEMENT Left 07/13/2021  ? Procedure: CYSTOSCOPY/URETEROSCOPY/HOLMIUM LASER/STENT PLACEMENT;  Surgeon: Lucas Mallow, MD;  [facility];  Service: Urology;  Laterality: Left;  ? EXTRACORPOREAL SHOCK WAVE LITHOTRIPSY  x2 in  2006//   x2  in 2012  ? HOLMIUM LASER APPLICATION Right 16/04/9603  ?  Procedure: HOLMIUM LASER APPLICATION;  Surgeon: Cleon Gustin, MD;  Location: [facility];  Service: Urology;  Laterality: Right;  ? HOLMIUM LASER APPLICATION Right 54/03/8118  ? Procedure: HOLMIUM LASER APPLICATION;  Surgeon: Irine Seal, MD;  [facility];  Service: Urology;  Laterality: Right;  ? IR URETERAL STENT RIGHT NEW ACCESS W/O SEP NEPHROSTOMY CATH  11/25/2019  ? LAPAROSCOPIC APPENDECTOMY N/A 11/07/2012  ? Procedure: APPENDECTOMY LAPAROSCOPIC;  Surgeon: Harl Bowie, MD;  Location: [place];  Service: General;  Laterality: N/A;  ? NEPHROLITHOTOMY Left 08/06/2015  ? Procedure: 1ST STAGE  LEFT PERCUTANEOUS NEPHROLITHOTOMY ;  Surgeon: Irine Seal, MD;  [facility];  Service: Urology;  Laterality: Left;  ? NEPHROLITHOTOMY Right 11/26/2019  ? Procedure: NEPHROLITHOTOMY PERCUTANEOUS;  Surgeon: Irine Seal, MD;  [facility];  Service: Urology;  Laterality: Right;  ? NEPHROLITHOTOMY Right 11/28/2019  ? Procedure: NEPHROLITHOTOMY PERCUTANEOUS SECOND LOOK;  Surgeon: Alexis Frock, MD;  [facility];  Service: Urology;  Laterality: Right;  ? RADIOACTIVE SEED IMPLANT N/A 02/09/2021  ? Procedure: RADIOACTIVE SEED IMPLANT/BRACHYTHERAPY IMPLANT;  Surgeon: Irine Seal, MD;  Location: [facility];  Service: Urology;  Laterality: N/A;  ? SPACE OAR INSTILLATION N/A 02/09/2021  ? Procedure: SPACE OAR INSTILLATION;  Surgeon: Irine Seal, MD;  Location: [facility];  Service: Urology;  Laterality: N/A;  ? TONSILLECTOMY  1975  ? TRANSURETHRAL RESECTION OF BLADDER TUMOR  1998  ? ?Patient Active Problem List  ? Diagnosis Date Noted  ? Left ear hearing loss 09/19/2021  ? Bilateral otitis externa 09/19/2021  ? Chronic kidney disease, stage 1 09/16/2021  ? Chronic kidney disease, stage 3a ([place]) 09/16/2021  ? Long term (current) use of insulin ([place]) 09/16/2021  ? Low back pain 09/16/2021  ? Uric acid crystalluria 09/07/2021  ? Encounter for general adult medical  examination with abnormal findings 09/07/2021  ? Chronic eczematous otitis externa of both ears 09/07/2021  ? Gastroesophageal reflux disease without esophagitis 09/07/2021  ? Cervical stenosis of spinal canal 12/15/2020  ? Gouty renal disease 05/13/2020  ? ED (erectile dysfunction) of organic origin 01/15/2020  ? Right kidney stone 11/26/2019  ? Diuretic-induced hypokalemia 08/29/2019  ? Prostate cancer ([place]) 09/06/2018  ? Tobacco abuse 07/17/2018  ? Essential hypertension 03/07/2018  ? Seasonal allergic rhinitis due to pollen 09/26/2017  ? Sleep apnea in adult 06/24/2015  ? DDD (degenerative disc disease), cervical 11/10/2014  ? Background diabetic retinopathy ([place]) 08/05/2013  ? Hyperlipidemia with target LDL less than 100 11/05/2012  ? LADA (latent autoimmune diabetes in adults), managed as type 1 ([place]) 06/26/2012  ? ? ?PCP: Janith Lima, MD ? ?REFERRING PROVIDER: Fenton Malling, NP ? ?REFERRING DIAG: M54.2 (ICD-10-CM) - Cervicalgia ? ?THERAPY DIAG:  ?Cervicalgia - Plan: PT plan of care cert/re-cert ? ?Muscle weakness (generalized) - Plan: PT plan of care cert/re-cert ? ?Other abnormalities of gait and mobility - Plan: PT plan of care cert/re-cert ? ?ONSET DATE: 2021 ? ?SUBJECTIVE:                                                                                                                                                                                                        ? ?  SUBJECTIVE STATEMENT: ?Pt states that he feels like a "new man." He is much better than when he started.   He still feels he has some balance issues.  ? ?PERTINENT HISTORY:  ?Cervical fusion C3-C5- May/July 2022, DDD, HTN, HLD ? ?PAIN:  ?Are you having pain? No ?NPRS scale: 0/10 ?R UT, aching sore ? ?PRECAUTIONS: None ? ?WEIGHT BEARING RESTRICTIONS No ? ? ?PATIENT GOALS : Pt would like strengthen the neck and upper quarter. Pt would like to also improve his balance.  ? ?OBJECTIVE:  ? ?DIAGNOSTIC FINDINGS:  ?FINDINGS: ?1st  cross-table lateral view demonstrates anterior localizing ?instrument directed at the C4-5 level. ?  ?Second lateral intraoperative image demonstrates 2 level anterior ?fusion from C3-C5. C5 vertebral body not well visua

## 2021-11-09 DIAGNOSIS — N200 Calculus of kidney: Secondary | ICD-10-CM | POA: Diagnosis not present

## 2021-11-18 ENCOUNTER — Ambulatory Visit (HOSPITAL_BASED_OUTPATIENT_CLINIC_OR_DEPARTMENT_OTHER): Payer: Medicare Other | Admitting: Physical Therapy

## 2021-12-01 DIAGNOSIS — N200 Calculus of kidney: Secondary | ICD-10-CM | POA: Diagnosis not present

## 2021-12-01 DIAGNOSIS — C61 Malignant neoplasm of prostate: Secondary | ICD-10-CM | POA: Diagnosis not present

## 2021-12-01 LAB — PSA: PSA: 0.3

## 2021-12-02 DIAGNOSIS — Z9889 Other specified postprocedural states: Secondary | ICD-10-CM | POA: Diagnosis not present

## 2021-12-02 DIAGNOSIS — Z79899 Other long term (current) drug therapy: Secondary | ICD-10-CM | POA: Diagnosis not present

## 2021-12-02 DIAGNOSIS — N200 Calculus of kidney: Secondary | ICD-10-CM | POA: Diagnosis not present

## 2021-12-02 DIAGNOSIS — Z833 Family history of diabetes mellitus: Secondary | ICD-10-CM | POA: Diagnosis not present

## 2021-12-02 DIAGNOSIS — I129 Hypertensive chronic kidney disease with stage 1 through stage 4 chronic kidney disease, or unspecified chronic kidney disease: Secondary | ICD-10-CM | POA: Diagnosis not present

## 2021-12-02 DIAGNOSIS — Z7982 Long term (current) use of aspirin: Secondary | ICD-10-CM | POA: Diagnosis not present

## 2021-12-02 DIAGNOSIS — N189 Chronic kidney disease, unspecified: Secondary | ICD-10-CM | POA: Diagnosis not present

## 2021-12-02 DIAGNOSIS — F17210 Nicotine dependence, cigarettes, uncomplicated: Secondary | ICD-10-CM | POA: Diagnosis not present

## 2021-12-02 DIAGNOSIS — Z8249 Family history of ischemic heart disease and other diseases of the circulatory system: Secondary | ICD-10-CM | POA: Diagnosis not present

## 2021-12-02 DIAGNOSIS — E1140 Type 2 diabetes mellitus with diabetic neuropathy, unspecified: Secondary | ICD-10-CM | POA: Diagnosis not present

## 2021-12-02 DIAGNOSIS — Z8551 Personal history of malignant neoplasm of bladder: Secondary | ICD-10-CM | POA: Diagnosis not present

## 2021-12-02 DIAGNOSIS — Z794 Long term (current) use of insulin: Secondary | ICD-10-CM | POA: Diagnosis not present

## 2021-12-02 DIAGNOSIS — E1122 Type 2 diabetes mellitus with diabetic chronic kidney disease: Secondary | ICD-10-CM | POA: Diagnosis not present

## 2021-12-02 DIAGNOSIS — E669 Obesity, unspecified: Secondary | ICD-10-CM | POA: Diagnosis not present

## 2021-12-02 DIAGNOSIS — M1990 Unspecified osteoarthritis, unspecified site: Secondary | ICD-10-CM | POA: Diagnosis not present

## 2021-12-02 DIAGNOSIS — Z6834 Body mass index (BMI) 34.0-34.9, adult: Secondary | ICD-10-CM | POA: Diagnosis not present

## 2021-12-08 DIAGNOSIS — R8279 Other abnormal findings on microbiological examination of urine: Secondary | ICD-10-CM | POA: Diagnosis not present

## 2021-12-08 DIAGNOSIS — R311 Benign essential microscopic hematuria: Secondary | ICD-10-CM | POA: Diagnosis not present

## 2021-12-08 DIAGNOSIS — Z8546 Personal history of malignant neoplasm of prostate: Secondary | ICD-10-CM | POA: Diagnosis not present

## 2021-12-08 DIAGNOSIS — R8271 Bacteriuria: Secondary | ICD-10-CM | POA: Diagnosis not present

## 2021-12-08 DIAGNOSIS — N5201 Erectile dysfunction due to arterial insufficiency: Secondary | ICD-10-CM | POA: Diagnosis not present

## 2021-12-09 ENCOUNTER — Ambulatory Visit (HOSPITAL_BASED_OUTPATIENT_CLINIC_OR_DEPARTMENT_OTHER): Payer: Medicare Other | Admitting: Physical Therapy

## 2021-12-10 DIAGNOSIS — N182 Chronic kidney disease, stage 2 (mild): Secondary | ICD-10-CM | POA: Diagnosis not present

## 2021-12-10 DIAGNOSIS — E785 Hyperlipidemia, unspecified: Secondary | ICD-10-CM | POA: Diagnosis not present

## 2021-12-10 DIAGNOSIS — Z87442 Personal history of urinary calculi: Secondary | ICD-10-CM | POA: Diagnosis not present

## 2021-12-10 DIAGNOSIS — E1022 Type 1 diabetes mellitus with diabetic chronic kidney disease: Secondary | ICD-10-CM | POA: Diagnosis not present

## 2021-12-10 DIAGNOSIS — Z720 Tobacco use: Secondary | ICD-10-CM | POA: Diagnosis not present

## 2021-12-10 DIAGNOSIS — Z794 Long term (current) use of insulin: Secondary | ICD-10-CM | POA: Diagnosis not present

## 2021-12-10 LAB — HEMOGLOBIN A1C: Hemoglobin A1C: 8.3

## 2021-12-18 ENCOUNTER — Other Ambulatory Visit: Payer: Self-pay | Admitting: Internal Medicine

## 2021-12-18 DIAGNOSIS — B354 Tinea corporis: Secondary | ICD-10-CM

## 2021-12-22 ENCOUNTER — Encounter (HOSPITAL_BASED_OUTPATIENT_CLINIC_OR_DEPARTMENT_OTHER): Payer: Self-pay | Admitting: Physical Therapy

## 2021-12-22 ENCOUNTER — Ambulatory Visit (HOSPITAL_BASED_OUTPATIENT_CLINIC_OR_DEPARTMENT_OTHER): Payer: Medicare Other | Attending: Neurosurgery | Admitting: Physical Therapy

## 2021-12-22 DIAGNOSIS — M542 Cervicalgia: Secondary | ICD-10-CM | POA: Insufficient documentation

## 2021-12-22 DIAGNOSIS — R2689 Other abnormalities of gait and mobility: Secondary | ICD-10-CM | POA: Diagnosis not present

## 2021-12-22 DIAGNOSIS — M6281 Muscle weakness (generalized): Secondary | ICD-10-CM | POA: Insufficient documentation

## 2021-12-22 NOTE — Therapy (Addendum)
OUTPATIENT PHYSICAL THERAPY TREATMENT NOTE  PHYSICAL THERAPY DISCHARGE SUMMARY  Visits from Start of Care: 9  Plan: Patient agrees to discharge.  Patient goals were mostly met. Patient is being discharged due to not returning to therapy.      Patient Name: Anthony Daniel. MRN: [id number] DOB:October 10, 1950, 71 y.o., male Today's Date: 12/22/2021   PT End of Session - 12/22/21 1023     Visit Number 9    Number of Visits 18    Date for PT Re-Evaluation 02/02/22    Authorization Type Medicare    PT Start Time 1020    PT Stop Time 1100    PT Time Calculation (min) 40 min    Activity Tolerance Patient tolerated treatment well;Patient limited by pain    Behavior During Therapy WFL for tasks assessed/performed                    Past Medical History:  Diagnosis Date   Arthritis    Back   Benign localized prostatic hyperplasia with lower urinary tract symptoms (LUTS)    CKD (chronic kidney disease), stage II    History of bladder cancer    1998--  TCC   History of kidney stones    managed by urologist--- dr Jeffie Pollock, dr prerminger [facility] , and dr gutierrez-aceves '@[facility]'    Hyperlipidemia    Hypertension    Insulin pump in place    02-02-2021 per pt currently not using pump, doing lantus insulin bid as directed by dr Buddy Duty   LADA (latent autoimmune diabetes in adults), managed as type 1 [facility])    endocrinologist-- dr Buddy Duty   PONV (postoperative nausea and vomiting)    Prostate cancer [facility])    urologist--- dr Jeffie Pollock--  first dx 02/ 2020 gleason 3+3, active surveillance;  bx 09-23-2020  Gleason 4+4, PSA 7.64   Spinal stenosis, cervical region    Uses self-applied continuous glucose monitoring device    DEX-COM   Past Surgical History:  Procedure Laterality Date   ANTERIOR CERVICAL DECOMP/DISCECTOMY FUSION N/A 12/15/2020   Procedure: Cervical Three-Four Cervical Four-Five Anterior cervical decompression/discectomy/fusion;  Surgeon: Erline Levine, MD;  Location: [place];   Service: Neurosurgery;  Laterality: N/A;   COLONOSCOPY  2018   x2   CYSTOSCOPY  02/09/2021   Procedure: CYSTOSCOPY FLEXIBLE;  Surgeon: Irine Seal, MD;  Location: [facility];  Service: Urology;;   CYSTOSCOPY W/ URETERAL STENT PLACEMENT Right 05/12/2015   Procedure: CYSTOSCOPY WITH STENT REPLACEMENT;  Surgeon: Cleon Gustin, MD;  Location: [facility];  Service: Urology;  Laterality: Right;   CYSTOSCOPY WITH RETROGRADE PYELOGRAM, URETEROSCOPY AND STENT PLACEMENT Right 01/23/2019   Procedure: CYSTOSCOPY WITH RIGHT RETROGRADE PYELOGRAM, URETEROSCOPY AND STENT PLACEMENT;  Surgeon: Irine Seal, MD;  [facility];  Service: Urology;  Laterality: Right;   CYSTOSCOPY WITH URETEROSCOPY AND STENT PLACEMENT Right 04/23/2015   Procedure: CYSTOSCOPY WITH URETEROSCOPY RETROGRADE PYELOGRAM AND STENT PLACEMENT;  Surgeon: Cleon Gustin, MD;  [facility];  Service: Urology;  Laterality: Right;   CYSTOSCOPY/RETROGRADE/URETEROSCOPY/STONE EXTRACTION WITH BASKET Right 05/12/2015   Procedure: CYSTOSCOPY/RETROGRADE/URETEROSCOPY/STONE EXTRACTION WITH BASKET;  Surgeon: Cleon Gustin, MD;  Location: [facility];  Service: Urology;  Laterality: Right;   CYSTOSCOPY/URETEROSCOPY/HOLMIUM LASER/STENT PLACEMENT Left 09/18/2018   Procedure: CYSTOSCOPY/ LEFT RETROGRADE LEFT URETEROSCOPY [place] LASER/STENT PLACEMENT;  Surgeon: Irine Seal, MD;  Location: [facility];  Service: Urology;  Laterality: Left;   CYSTOSCOPY/URETEROSCOPY/HOLMIUM LASER/STENT PLACEMENT Right 10/24/2019   Procedure: CYSTOSCOPY/URETEROSCOPY/HOLMIUM LASER/STENT  PLACEMENT;  Surgeon: Irine Seal, MD;  [facility];  Service: Urology;  Laterality: Right;   CYSTOSCOPY/URETEROSCOPY/HOLMIUM LASER/STENT PLACEMENT Left 01/30/2020   '[facility]    CYSTOSCOPY/URETEROSCOPY/HOLMIUM LASER/STENT PLACEMENT Left 07/13/2021   Procedure: CYSTOSCOPY/URETEROSCOPY/HOLMIUM LASER/STENT PLACEMENT;   Surgeon: Lucas Mallow, MD;  [facility];  Service: Urology;  Laterality: Left;   EXTRACORPOREAL SHOCK WAVE LITHOTRIPSY  x2 in  2006//   x2  in 2012   HOLMIUM LASER APPLICATION Right 27/51/7001   Procedure: HOLMIUM LASER APPLICATION;  Surgeon: Cleon Gustin, MD;  Location[facility];  Service: Urology;  Laterality: Right;   HOLMIUM LASER APPLICATION Right 74/94/4967   Procedure: HOLMIUM LASER APPLICATION;  Surgeon: Irine Seal, MD;  [facility];  Service: Urology;  Laterality: Right;   IR URETERAL STENT RIGHT NEW ACCESS W/O SEP NEPHROSTOMY CATH  11/25/2019   LAPAROSCOPIC APPENDECTOMY N/A 11/07/2012   Procedure: APPENDECTOMY LAPAROSCOPIC;  Surgeon: Harl Bowie, MD;  Location: [place];  Service: General;  Laterality: N/A;   NEPHROLITHOTOMY Left 08/06/2015   Procedure: 1ST STAGE  LEFT PERCUTANEOUS NEPHROLITHOTOMY ;  Surgeon: Irine Seal, MD;  [facility];  Service: Urology;  Laterality: Left;   NEPHROLITHOTOMY Right 11/26/2019   Procedure: NEPHROLITHOTOMY PERCUTANEOUS;  Surgeon: Irine Seal, MD;  [facility];  Service: Urology;  Laterality: Right;   NEPHROLITHOTOMY Right 11/28/2019   Procedure: NEPHROLITHOTOMY PERCUTANEOUS SECOND LOOK;  Surgeon: Alexis Frock, MD;  [facility];  Service: Urology;  Laterality: Right;   RADIOACTIVE SEED IMPLANT N/A 02/09/2021   Procedure: RADIOACTIVE SEED IMPLANT/BRACHYTHERAPY IMPLANT;  Surgeon: Irine Seal, MD;  Location: [facility];  Service: Urology;  Laterality: N/A;   SPACE OAR INSTILLATION N/A 02/09/2021   Procedure: SPACE OAR INSTILLATION;  Surgeon: Irine Seal, MD;  Location: [place];  Service: Urology;  Laterality: N/A;   TONSILLECTOMY  1975   TRANSURETHRAL RESECTION OF BLADDER TUMOR  1998   Patient Active Problem List   Diagnosis Date Noted   Left ear hearing loss 09/19/2021   Bilateral otitis externa 09/19/2021   Chronic kidney disease, stage 1 09/16/2021    Chronic kidney disease, stage 3a ([place]) 09/16/2021   Long term (current) use of insulin ([place]) 09/16/2021   Low back pain 09/16/2021   Uric acid crystalluria 09/07/2021   Encounter for general adult medical examination with abnormal findings 09/07/2021   Chronic eczematous otitis externa of both ears 09/07/2021   Gastroesophageal reflux disease without esophagitis 09/07/2021   Cervical stenosis of spinal canal 12/15/2020   Gouty renal disease 05/13/2020   ED (erectile dysfunction) of organic origin 01/15/2020   Right kidney stone 11/26/2019   Diuretic-induced hypokalemia 08/29/2019   Prostate cancer ([place]) 09/06/2018   Tobacco abuse 07/17/2018   Essential hypertension 03/07/2018   Seasonal allergic rhinitis due to pollen 09/26/2017   Sleep apnea in adult 06/24/2015   DDD (degenerative disc disease), cervical 11/10/2014   Background diabetic retinopathy ([place]) 08/05/2013   Hyperlipidemia with target LDL less than 100 11/05/2012   LADA (latent autoimmune diabetes in adults), managed as type 1 ([place]) 06/26/2012    PCP: Janith Lima, MD  REFERRING PROVIDER: Fenton Malling, NP  REFERRING DIAG: M54.2 (ICD-10-CM) - Cervicalgia  THERAPY DIAG:  Cervicalgia  Other abnormalities of gait and mobility  Muscle weakness (generalized)  ONSET DATE: 2021  SUBJECTIVE:  SUBJECTIVE STATEMENT: Pt states he has had some issues with balance, especially with turning his head/changing directions.   PERTINENT HISTORY:  Cervical fusion C3-C5- May/July 2022, DDD, HTN, HLD  PAIN:  Are you having pain? No NPRS scale: 0/10 R UT, aching sore  PRECAUTIONS: None  WEIGHT BEARING RESTRICTIONS No   PATIENT GOALS : Pt would like strengthen the neck and upper quarter. Pt would like to also improve his  balance.   OBJECTIVE:   DIAGNOSTIC FINDINGS:  FINDINGS: 1st cross-table lateral view demonstrates anterior localizing instrument directed at the C4-5 level.   Second lateral intraoperative image demonstrates 2 level anterior fusion from C3-C5. C5 vertebral body not well visualized due to overlying shoulders.   IMPRESSION: Intraoperative imaging as above.  PATIENT SURVEYS:  FOTO 51 58 at D/C  6th VISIT  53pts  8th VISIT 49 pts   TODAY'S TREATMENT:   VORx1 NBOS 30s 3x on airex Tandem balance 30s 4x SL with toe touch 30s 3x each Retro walk at wall 66f 3s laps Gait with head turns [id number] Start stop with gait 7586fx Fast walk with turn and pivot 52f9f    PATIENT EDUCATION:  Education details: balance systems, balance strategies, exercise progression, muscle firing,  envelope of function, HEP, POC  Person educated: Patient Education method: Explanation, Demonstration, Tactile cues, Verbal cues, and Handouts Education comprehension: verbalized understanding, returned demonstration, verbal cues required, and tactile cues required   HOME EXERCISE PROGRAM: Access Code: [email or web address]: [email or web address] Date: 10/06/2021 Prepared by: AlanDaleen Bo ASSESSMENT:  CLINICAL IMPRESSION: Pt able to pass all balance related tasks at today's session without LOB. Pt able to perform dynamic and static gait activity without issue or recreation of LOB. Pt advised on maintaining COG with BOS during transitions in order to limit instability. Pt does have ankle and hip motor control deficits that may be playing into complaints of LOB. Pt HEP updated and pt to schedule PRN for future session as pt is largely independent at this time and without concerns about the neck.   Objective impairments include decreased ROM, decreased strength, hypomobility, increased fascial restrictions, increased muscle spasms, impaired flexibility, impaired UE functional use, improper  body mechanics, postural dysfunction, and pain. These impairments are limiting patient from cleaning, community activity, driving, meal prep, laundry, yard work, shopping, and exercise/recreation . Personal factors including Age, Behavior pattern, Past/current experiences, Time since onset of injury/illness/exacerbation, and 1-2 comorbidities:    are also affecting patient's functional outcome. Patient will benefit from skilled PT to address above impairments and improve overall function.  REHAB POTENTIAL: Fair    CLINICAL DECISION MAKING: Evolving/moderate complexity  EVALUATION COMPLEXITY: Moderate   GOALS:   SHORT TERM GOALS:  STG Name Target Date Goal status  1 Pt will become independent with HEP in order to demonstrate synthesis of PT education.  02/02/2022 MET  2 Pt will be able to demonstrate OH ROM and reach in order to demonstrate functional improvement in UE function for self-care and house hold duties.  02/02/2022 MET  3 Pt will be able to demonstrate at least 20% increase in cervical rotation in order to demonstrate functional improvement in C/S function for self-care in driving tasks.   02/02/2022 MET   LONG TERM GOALS:   LTG Name Target Date Goal status  1 Pt  will become independent with final HEP in order to demonstrate synthesis of PT education.  03/16/2022 MET  2 Pt will be able to reach [facility]/hold >  5 lbs in order to demonstrate functional improvement in UE strength for return to PLOF and exercise.   03/16/2022 MET  3 Pt will be able to demonstrate ability to stand/turn/pivot without LOB in order to demonstrate functional improvement in balance function for community and household mobility.  03/16/2022 MET  4 Pt will be able to demonstrate uneven surface walking with COD without LOB in order to demonstrate functional improvement in UE/LE function for self-care and house hold duties.   03/16/2022 ongoing   PLAN: PT FREQUENCY: 1-2x/week  PT DURATION: 12 weeks  (likely d/c by 8)  PLANNED INTERVENTIONS: Therapeutic exercises, Therapeutic activity, Neuro Muscular re-education, Balance training, Gait training, Patient/Family education, and Joint mobilization  PLAN FOR NEXT SESSION: review HEP, balance-static/dynamic  Daleen Bo PT, DPT 12/22/21 11:02 AM

## 2021-12-28 DIAGNOSIS — Z6834 Body mass index (BMI) 34.0-34.9, adult: Secondary | ICD-10-CM | POA: Diagnosis not present

## 2021-12-28 DIAGNOSIS — E119 Type 2 diabetes mellitus without complications: Secondary | ICD-10-CM | POA: Diagnosis not present

## 2021-12-28 DIAGNOSIS — E669 Obesity, unspecified: Secondary | ICD-10-CM | POA: Diagnosis not present

## 2021-12-28 DIAGNOSIS — Z713 Dietary counseling and surveillance: Secondary | ICD-10-CM | POA: Diagnosis not present

## 2022-01-04 ENCOUNTER — Ambulatory Visit: Payer: BC Managed Care – PPO | Admitting: Dietician

## 2022-01-05 ENCOUNTER — Ambulatory Visit (HOSPITAL_BASED_OUTPATIENT_CLINIC_OR_DEPARTMENT_OTHER): Payer: Medicare Other | Admitting: Physical Therapy

## 2022-01-08 ENCOUNTER — Other Ambulatory Visit: Payer: Self-pay | Admitting: Internal Medicine

## 2022-01-08 DIAGNOSIS — E139 Other specified diabetes mellitus without complications: Secondary | ICD-10-CM

## 2022-01-08 DIAGNOSIS — I10 Essential (primary) hypertension: Secondary | ICD-10-CM

## 2022-01-13 ENCOUNTER — Other Ambulatory Visit: Payer: Self-pay | Admitting: Internal Medicine

## 2022-01-13 DIAGNOSIS — E785 Hyperlipidemia, unspecified: Secondary | ICD-10-CM

## 2022-01-19 ENCOUNTER — Encounter (HOSPITAL_BASED_OUTPATIENT_CLINIC_OR_DEPARTMENT_OTHER): Payer: BC Managed Care – PPO | Admitting: Physical Therapy

## 2022-01-25 DIAGNOSIS — Z87442 Personal history of urinary calculi: Secondary | ICD-10-CM | POA: Diagnosis not present

## 2022-01-25 DIAGNOSIS — N200 Calculus of kidney: Secondary | ICD-10-CM | POA: Diagnosis not present

## 2022-02-01 ENCOUNTER — Other Ambulatory Visit: Payer: Self-pay | Admitting: Internal Medicine

## 2022-02-01 DIAGNOSIS — J301 Allergic rhinitis due to pollen: Secondary | ICD-10-CM

## 2022-02-02 ENCOUNTER — Encounter (HOSPITAL_BASED_OUTPATIENT_CLINIC_OR_DEPARTMENT_OTHER): Payer: Self-pay

## 2022-02-02 ENCOUNTER — Ambulatory Visit (HOSPITAL_BASED_OUTPATIENT_CLINIC_OR_DEPARTMENT_OTHER): Payer: Medicare Other | Attending: Neurosurgery | Admitting: Physical Therapy

## 2022-02-02 DIAGNOSIS — M6281 Muscle weakness (generalized): Secondary | ICD-10-CM | POA: Insufficient documentation

## 2022-02-02 DIAGNOSIS — R2689 Other abnormalities of gait and mobility: Secondary | ICD-10-CM | POA: Insufficient documentation

## 2022-02-02 DIAGNOSIS — M542 Cervicalgia: Secondary | ICD-10-CM | POA: Insufficient documentation

## 2022-02-14 ENCOUNTER — Ambulatory Visit (INDEPENDENT_AMBULATORY_CARE_PROVIDER_SITE_OTHER): Payer: Medicare Other

## 2022-02-14 DIAGNOSIS — Z0000 Encounter for general adult medical examination without abnormal findings: Secondary | ICD-10-CM | POA: Diagnosis not present

## 2022-02-14 NOTE — Progress Notes (Signed)
Subjective:   Anthony Daniel. is a 71 y.o. male who presents for an Subsequent Medicare Annual Wellness Visit.   I connected with Anthony Daniel today by telephone and verified that I am speaking with the correct person using two identifiers. Location patient: home Location provider: work Persons participating in the virtual visit: patient, provider.   I discussed the limitations, risks, security and privacy concerns of performing an evaluation and management service by telephone and the availability of in person appointments. I also discussed with the patient that there may be a patient responsible charge related to this service. The patient expressed understanding and verbally consented to this telephonic visit.    Interactive audio and video telecommunications were attempted between this provider and patient, however failed, due to patient having technical difficulties OR patient did not have access to video capability.  We continued and completed visit with audio only.    Review of Systems     Cardiac Risk Factors include: advanced age (>23mn, >>23women);male gender;diabetes mellitus     Objective:    Today's Vitals   There is no height or weight on file to calculate BMI.     02/14/2022   10:56 AM 08/31/2021    8:11 AM 07/30/2021   10:18 AM 07/13/2021    4:02 PM 02/23/2021    1:22 PM 02/09/2021    6:04 AM 01/07/2021   10:01 AM  Advanced Directives  Does Patient Have a Medical Advance Directive? Yes No No No No No No  Type of AParamedicof APassaicLiving will        Copy of HBousein Chart? No - copy requested        Would patient like information on creating a medical advance directive?  Yes (MAU/Ambulatory/Procedural Areas - Information given) Yes (MAU/Ambulatory/Procedural Areas - Information given) No - Patient declined  Yes (MAU/Ambulatory/Procedural Areas - Information given) No - Patient declined    Current Medications  (verified) Outpatient Encounter Medications as of 02/14/2022  Medication Sig   allopurinol (ZYLOPRIM) 100 MG tablet Take 1 tablet (100 mg total) by mouth daily.   amoxicillin-clavulanate (AUGMENTIN) 875-125 MG tablet Take 1 tablet by mouth 2 (two) times daily.   aspirin EC 81 MG tablet Take 81 mg by mouth in the morning.   candesartan (ATACAND) 16 MG tablet TAKE 1 TABLET BY MOUTH EVERY DAY   Ciclopirox 0.77 % gel APPLY TO AFFECTED AREA TWICE A DAY   Continuous Blood Gluc Receiver (DEXCOM G6 RECEIVER) DEVI See admin instructions.   Continuous Blood Gluc Sensor (DEXCOM G6 SENSOR) MISC change sensor   Continuous Blood Gluc Transmit (DEXCOM G6 TRANSMITTER) MISC See admin instructions.   ezetimibe (ZETIA) 10 MG tablet TAKE 1 TABLET BY MOUTH EVERY DAY   glucose blood (ONETOUCH VERIO) test strip 5X A DAY   GVOKE HYPOPEN 2-PACK 1 MG/0.2ML SOAJ INJECT 1 ACT INTO THE SKIN DAILY AS NEEDED (HYPOGLYCEMIA).   indapamide (LOZOL) 1.25 MG tablet TAKE 1 TABLET BY MOUTH EVERY DAY   insulin aspart (NOVOLOG) 100 UNIT/ML injection Inject into the skin as directed. USE VIA INSULIN PUMP TO ADMINISTER UP TO 130 UNITS MAXIUM DAILY   Insulin Glargine (LANTUS SOLOSTAR [place]) Inject into the skin 2 (two) times daily. 25 UNITS IN AM AND  20--25 UNITS;  WHEN FASTING OR UNABLE TO EAT LOWER TO 18 UNITS TWICE DAILY   Insulin Human (INSULIN PUMP) SOLN Inject 1 each into the skin continuous. Novolog insulin  levocetirizine (XYZAL) 5 MG tablet TAKE 1 TABLET BY MOUTH DAILY AS NEEDED FOR ALLERGIES.   neomycin-polymyxin-hydrocortisone (CORTISPORIN) 3.5-10000-1 OTIC suspension Place 3 drops into both ears 3 (three) times daily.   NOVOLOG FLEXPEN 100 UNIT/ML FlexPen Inject 12-15 Units into the skin 3 (three) times daily before meals. WHEN NOT USING INSULIN PUMP ;  WHEN FASTING OR UNABLE TO EAT ONLY WHEN NEEDED TO TREAT BLOOD SUGAR >200   omeprazole (PRILOSEC) 20 MG capsule Take 1 capsule (20 mg total) by mouth daily.   ondansetron (ZOFRAN  ODT) 4 MG disintegrating tablet Take 1 tablet (4 mg total) by mouth every 8 (eight) hours as needed for nausea or vomiting.   Polyethyl Glycol-Propyl Glycol (SYSTANE OP) Place 1 drop into both eyes 3 (three) times daily as needed (dry/irritated eyes).   Potassium Citrate 15 MEQ (1620 MG) TBCR Take 1 tablet by mouth in the morning and at bedtime.   tamsulosin (FLOMAX) 0.4 MG CAPS capsule Take 0.4 mg by mouth in the morning.   No facility-administered encounter medications on file as of 02/14/2022.    Allergies (verified) Altace [ramipril], Crestor [rosuvastatin], Lipitor [atorvastatin], Dilaudid [hydromorphone], Iodinated contrast media, Iodine, Latex, Oxycodone, Adhesive [tape], and Peanut-containing drug products   History: Past Medical History:  Diagnosis Date   Arthritis    Back   Benign localized prostatic hyperplasia with lower urinary tract symptoms (LUTS)    CKD (chronic kidney disease), stage II    History of bladder cancer    1998--  TCC   History of kidney stones    managed by urologist--- dr Anthony Daniel, dr Anthony Daniel [facility] , and dr Anthony Daniel [facility]    Hyperlipidemia    Hypertension    Insulin pump in place    02-02-2021 per pt currently not using pump, doing lantus insulin bid as directed by dr Anthony Daniel   LADA (latent autoimmune diabetes in adults), managed as type 1 [facility])    endocrinologist-- dr Anthony Daniel   PONV (postoperative nausea and vomiting)    Prostate cancer [facility])    urologist--- dr Anthony Daniel--  first dx 02/ 2020 gleason 3+3, active surveillance;  bx 09-23-2020  Gleason 4+4, PSA 7.64   Spinal stenosis, cervical region    Uses self-applied continuous glucose monitoring device    DEX-COM   Past Surgical History:  Procedure Laterality Date   ANTERIOR CERVICAL DECOMP/DISCECTOMY FUSION N/A 12/15/2020   Procedure: Cervical Three-Four Cervical Four-Five Anterior cervical decompression/discectomy/fusion;  Surgeon: Anthony Levine, MD;  Location: [place];  Service: Neurosurgery;   Laterality: N/A;   COLONOSCOPY  2018   x2   CYSTOSCOPY  02/09/2021   Procedure: CYSTOSCOPY FLEXIBLE;  Surgeon: Irine Seal, MD;  Location: [facility];  Service: Urology;;   CYSTOSCOPY W/ URETERAL STENT PLACEMENT Right 05/12/2015   Procedure: CYSTOSCOPY WITH STENT REPLACEMENT;  Surgeon: Cleon Gustin, MD;  Location[facility];  Service: Urology;  Laterality: Right;   CYSTOSCOPY WITH RETROGRADE PYELOGRAM, URETEROSCOPY AND STENT PLACEMENT Right 01/23/2019   Procedure: CYSTOSCOPY WITH RIGHT RETROGRADE PYELOGRAM, URETEROSCOPY AND STENT PLACEMENT;  Surgeon: Irine Seal, MD;  Location: [facility];  Service: Urology;  Laterality: Right;   CYSTOSCOPY WITH URETEROSCOPY AND STENT PLACEMENT Right 04/23/2015   Procedure: CYSTOSCOPY WITH URETEROSCOPY RETROGRADE PYELOGRAM AND STENT PLACEMENT;  Surgeon: Cleon Gustin, MD;  Location: [facility];  Service: Urology;  Laterality: Right;   CYSTOSCOPY/RETROGRADE/URETEROSCOPY/STONE EXTRACTION WITH BASKET Right 05/12/2015   Procedure: CYSTOSCOPY/RETROGRADE/URETEROSCOPY/STONE EXTRACTION WITH BASKET;  Surgeon: Cleon Gustin, MD;  Location: [facility];  Service: Urology;  Laterality: Right;   CYSTOSCOPY/URETEROSCOPY/HOLMIUM LASER/STENT PLACEMENT Left 09/18/2018   Procedure: CYSTOSCOPY/ LEFT RETROGRADE LEFT URETEROSCOPY [place] LASER/STENT PLACEMENT;  Surgeon: Irine Seal, MD;  Location[facility];  Service: Urology;  Laterality: Left;   CYSTOSCOPY/URETEROSCOPY/HOLMIUM LASER/STENT PLACEMENT Right 10/24/2019   Procedure: CYSTOSCOPY/URETEROSCOPY/HOLMIUM LASER/STENT PLACEMENT;  Surgeon: Irine Seal, MD;  Location[facility];  Service: Urology;  Laterality: Right;   CYSTOSCOPY/URETEROSCOPY/HOLMIUM LASER/STENT PLACEMENT Left 01/30/2020   '[facility]    CYSTOSCOPY/URETEROSCOPY/HOLMIUM LASER/STENT PLACEMENT Left 07/13/2021   Procedure: CYSTOSCOPY/URETEROSCOPY/HOLMIUM LASER/STENT PLACEMENT;  Surgeon: Lucas Mallow,  MD;  Location: [facility];  Service: Urology;  Laterality: Left;   EXTRACORPOREAL SHOCK WAVE LITHOTRIPSY  x2 in  2006//   x2  in 2012   HOLMIUM LASER APPLICATION Right 93/23/5573   Procedure: HOLMIUM LASER APPLICATION;  Surgeon: Cleon Gustin, MD;  Location: [facility];  Service: Urology;  Laterality: Right;   HOLMIUM LASER APPLICATION Right 22/08/5425   Procedure: HOLMIUM LASER APPLICATION;  Surgeon: Irine Seal, MD;  Location[facility];  Service: Urology;  Laterality: Right;   IR URETERAL STENT RIGHT NEW ACCESS W/O SEP NEPHROSTOMY CATH  11/25/2019   LAPAROSCOPIC APPENDECTOMY N/A 11/07/2012   Procedure: APPENDECTOMY LAPAROSCOPIC;  Surgeon: Harl Bowie, MD;  Location: [place];  Service: General;  Laterality: N/A;   NEPHROLITHOTOMY Left 08/06/2015   Procedure: 1ST STAGE  LEFT PERCUTANEOUS NEPHROLITHOTOMY ;  Surgeon: Irine Seal, MD;  Location[facility];  Service: Urology;  Laterality: Left;   NEPHROLITHOTOMY Right 11/26/2019   Procedure: NEPHROLITHOTOMY PERCUTANEOUS;  Surgeon: Irine Seal, MD;  [facility];  Service: Urology;  Laterality: Right;   NEPHROLITHOTOMY Right 11/28/2019   Procedure: NEPHROLITHOTOMY PERCUTANEOUS SECOND LOOK;  Surgeon: Alexis Frock, MD;  Location[facility];  Service: Urology;  Laterality: Right;   RADIOACTIVE SEED IMPLANT N/A 02/09/2021   Procedure: RADIOACTIVE SEED IMPLANT/BRACHYTHERAPY IMPLANT;  Surgeon: Irine Seal, MD;  Location: [facility];  Service: Urology;  Laterality: N/A;   SPACE OAR INSTILLATION N/A 02/09/2021   Procedure: SPACE OAR INSTILLATION;  Surgeon: Irine Seal, MD;  Location: [facility];  Service: Urology;  Laterality: N/A;   TONSILLECTOMY  1975   TRANSURETHRAL RESECTION OF BLADDER TUMOR  1998   Family History  Problem Relation Age of Onset   Cancer Other        Colon and Prostate Cancer   Hypertension Other    Diabetes Other    Alcohol abuse Neg Hx    Drug abuse Neg Hx    Early death Neg Hx     Heart disease Neg Hx    Hyperlipidemia Neg Hx    Kidney disease Neg Hx    Stroke Neg Hx    Social History   Socioeconomic History   Marital status: Married    Spouse name: Not on file   Number of children: Not on file   Years of education: Not on file   Highest education level: Master's degree (e.g., MA, MS, MEng, MEd, MSW, MBA)  Occupational History   Not on file  Tobacco Use   Smoking status: Every Day    Packs/day: 0.50    Years: 30.00    Total pack years: 15.00    Types: Cigarettes   Smokeless tobacco: Never  Vaping Use   Vaping Use: Never used  Substance and Sexual Activity   Alcohol use: Not Currently    Alcohol/week: 5.0 standard drinks of alcohol    Types: 5 Shots of liquor per week  Comment: occasional   Drug use: No   Sexual activity: Yes  Other Topics Concern   Not on file  Social History Narrative   Not on file   Social Determinants of Health   Financial Resource Strain: Low Risk  (02/14/2022)   Overall Financial Resource Strain (CARDIA)    Difficulty of Paying Living Expenses: Not hard at all  Food Insecurity: No Food Insecurity (02/14/2022)   Hunger Vital Sign    Worried About Running Out of Food in the Last Year: Never true    Ran Out of Food in the Last Year: Never true  Transportation Needs: No Transportation Needs (02/14/2022)   PRAPARE - Hydrologist (Medical): No    Lack of Transportation (Non-Medical): No  Physical Activity: Sufficiently Active (02/14/2022)   Exercise Vital Sign    Days of Exercise per Week: 5 days    Minutes of Exercise per Session: 30 min  Stress: No Stress Concern Present (02/14/2022)   [place]    Feeling of Stress : Not at all  Social Connections: Moderately Isolated (02/14/2022)   Social Connection and Isolation Panel [NHANES]    Frequency of Communication with Friends and Family: Three times a week    Frequency of Social  Gatherings with Friends and Family: Three times a week    Attends Religious Services: Never    Active Member of Clubs or Organizations: No    Attends Music therapist: Never    Marital Status: Married    Tobacco Counseling Ready to quit: Not Answered Counseling given: Not Answered   Clinical Intake:  Pre-visit preparation completed: Yes  Pain : No/denies pain     Nutritional Risks: None Diabetes: No  How often do you need to have someone help you when you read instructions, pamphlets, or other written materials from your doctor or pharmacy?: 1 - Never What is the last grade level you completed in school?: MBA  Diabetic?yes  Nutrition Risk Assessment:  Has the patient had any N/V/D within the last 2 months?  No  Does the patient have any non-healing wounds?  No  Has the patient had any unintentional weight loss or weight gain?  No   Diabetes:  Is the patient diabetic?  Yes  If diabetic, was a CBG obtained today?  No  Did the patient bring in their glucometer from home?  No  How often do you monitor your CBG's? Dexcom .   Financial Strains and Diabetes Management:  Are you having any financial strains with the device, your supplies or your medication? No .  Does the patient want to be seen by Chronic Care Management for management of their diabetes?  No  Would the patient like to be referred to a Nutritionist or for Diabetic Management?  No   Diabetic Exams:  Diabetic Eye Exam: Completed 02/2021 Diabetic Foot Exam: Overdue, Pt has been advised about the importance in completing this exam. Pt is scheduled for diabetic foot exam on next office visit .   Interpreter Needed?: No  Information entered by :: L.Wilson,LPN   Activities of Daily Living    02/14/2022   11:00 AM 07/13/2021    4:05 PM  In your present state of health, do you have any difficulty performing the following activities:  Hearing? 0 0  Vision? 0 0  Difficulty concentrating or  making decisions? 0 0  Walking or climbing stairs? 0 1  Dressing or  bathing? 0 0  Doing errands, shopping? 0   Preparing Food and eating ? N   Using the Toilet? N   In the past six months, have you accidently leaked urine? N   Do you have problems with loss of bowel control? N   Managing your Medications? N   Managing your Finances? N     Patient Care Team: Janith Lima, MD as PCP - General (Internal Medicine) Irine Seal, MD as Consulting Physician (Urology) Delrae Rend, MD as Consulting Physician (Endocrinology) Sherlynn Stalls, MD as Consulting Physician (Ophthalmology) Cira Rue, RN as Oncology Nurse Navigator  Indicate any recent Medical Services you may have received from other than [facility] providers in the past year (date may be approximate).     Assessment:   This is a routine wellness examination for [place].  Hearing/Vision screen Vision Screening - Comments:: Annual eye exams wears glasses   Dietary issues and exercise activities discussed: Current Exercise Habits: Home exercise routine, Type of exercise: walking, Time (Minutes): 30, Frequency (Times/Week): 5, Weekly Exercise (Minutes/Week): 150, Intensity: Mild, Exercise limited by: None identified   Goals Addressed   None    Depression Screen    02/14/2022   11:00 AM 02/14/2022   10:57 AM 02/14/2022   10:55 AM 09/07/2021   10:05 AM 08/31/2021    8:11 AM 08/17/2020    2:39 PM 07/28/2020    9:00 AM  PHQ 2/9 Scores  PHQ - 2 Score 0 0 0 0 0 0 0    Fall Risk    02/14/2022   11:00 AM 02/14/2022   10:57 AM 09/07/2021   10:04 AM 08/31/2021    8:11 AM 08/17/2020    2:46 PM  Fall Risk   Falls in the past year? 0 0 0 0 1  Number falls in past yr: 0 0 0  0  Injury with Fall? 0 0 0  0  Risk for fall due to :     History of fall(s)  Follow up Falls evaluation completed;Education provided Falls evaluation completed;Education provided   Falls evaluation completed    FALL RISK PREVENTION PERTAINING TO THE HOME:  Any  stairs in or around the home? Yes  If so, are there any without handrails? No  Home free of loose throw rugs in walkways, pet beds, electrical cords, etc? Yes  Adequate lighting in your home to reduce risk of falls? Yes   ASSISTIVE DEVICES UTILIZED TO PREVENT FALLS:  Life alert? No  Use of a cane, walker or w/c? No  Grab bars in the bathroom? No  Shower chair or bench in shower? Yes  Elevated toilet seat or a handicapped toilet? No    Cognitive Function:    Normal cognitive status assessed by telephone conversation  by this Nurse Health Advisor. No abnormalities found.      02/14/2022   11:01 AM  6CIT Screen  What Year? 0 points  What month? 0 points  What time? 0 points  Count back from 20 0 points  Months in reverse 0 points  Repeat phrase 0 points  Total Score 0 points    Immunizations Immunization History  Administered Date(s) Administered   Fluad Quad(high Dose 65+) 07/28/2020   Hep A / Hep B 09/24/2012, 11/05/2012   Influenza Split 04/17/2021   Influenza Whole 05/02/2013   Influenza, High Dose Seasonal PF 04/04/2018, 04/02/2019, 04/29/2020   Influenza, Seasonal, Injecte, Preservative Fre 07/01/2013   Influenza,inj,Quad PF,6+ Mos 05/23/2011, 05/02/2012, 03/07/2014, 03/22/2016   Influenza,inj,quad,  With Preservative 04/29/2015   Influenza-Unspecified 04/17/2017, 04/02/2019   PFIZER(Purple Top)SARS-COV-2 Vaccination 08/24/2019, 09/18/2019, 05/10/2020   Pfizer Covid-19 Vaccine Bivalent Booster 25yr & up 04/25/2021   Pneumococcal Conjugate-13 04/08/2014   Pneumococcal Polysaccharide-23 11/03/2010, 03/04/2013, 07/28/2020   Tdap 06/26/2012, 01/13/2020   Zoster, Live 02/23/2011    TDAP status: Up to date  Flu Vaccine status: Up to date  Pneumococcal vaccine status: Up to date  Covid-19 vaccine status: Completed vaccines  Qualifies for Shingles Vaccine? Yes   Zostavax completed No   Shingrix Completed?: No.    Education has been provided regarding the  importance of this vaccine. Patient has been advised to call insurance company to determine out of pocket expense if they have not yet received this vaccine. Advised may also receive vaccine at local pharmacy or Health Dept. Verbalized acceptance and understanding.  Screening Tests Health Maintenance  Topic Date Due   Zoster Vaccines- Shingrix (1 of 2) Never done   FOOT EXAM  07/28/2021   INFLUENZA VACCINE  02/15/2022   HEMOGLOBIN A1C  03/07/2022   Diabetic kidney evaluation - GFR measurement  07/12/2022   OPHTHALMOLOGY EXAM  07/16/2022   Diabetic kidney evaluation - Urine ACR  09/07/2022   COLONOSCOPY (Pts [id number] coverage will need to be confirmed)  07/28/2026   TETANUS/TDAP  01/12/2030   Pneumonia Vaccine 6547Years old  Completed   COVID-19 Vaccine  Completed   Hepatitis C Screening  Completed   HPV VACCINES  Aged Out    Health Maintenance  Health Maintenance Due  Topic Date Due   Zoster Vaccines- Shingrix (1 of 2) Never done   FOOT EXAM  07/28/2021    Colorectal cancer screening: Type of screening: Colonoscopy. Completed 07/28/2016. Repeat every 10 years  Lung Cancer Screening: (Low Dose CT Chest recommended if Age 69-80ears, 30 pack-year currently smoking OR have quit w/in 15years.) does not qualify.   Lung Cancer Screening Referral: n/a  Additional Screening:  Hepatitis C Screening: does not qualify;   Vision Screening: Recommended annual ophthalmology exams for early detection of glaucoma and other disorders of the eye. Is the patient up to date with their annual eye exam?  Yes  Who is the provider or what is the name of the office in which the patient attends annual eye exams? Dr SaBaird CancerIf pt is not established with a provider, would they like to be referred to a provider to establish care? No .   Dental Screening: Recommended annual dental exams for proper oral hygiene  Community Resource Referral / Chronic Care Management: CRR required this  visit?  No   CCM required this visit?  No      Plan:     I have personally reviewed and noted the following in the patient's chart:   Medical and social history Use of alcohol, tobacco or illicit drugs  Current medications and supplements including opioid prescriptions. Patient is not currently taking opioid prescriptions. Functional ability and status Nutritional status Physical activity Advanced directives List of other physicians Hospitalizations, surgeries, and ER visits in previous 12 months Vitals Screenings to include cognitive, depression, and falls Referrals and appointments  In addition, I have reviewed and discussed with patient certain preventive protocols, quality metrics, and best practice recommendations. A written personalized care plan for preventive services as well as general preventive health recommendations were provided to patient.     LaDaphane ShepherdLPN   01/16/87/4166 Nurse Notes: none

## 2022-02-14 NOTE — Patient Instructions (Signed)
[name] , Thank you for taking time to come for your Medicare Wellness Visit. I appreciate your ongoing commitment to your health goals. Please review the following plan we discussed and let me know if I can assist you in the future.   Screening recommendations/referrals: Colonoscopy: 07/28/2016 Recommended yearly ophthalmology/optometry visit for glaucoma screening and checkup Recommended yearly dental visit for hygiene and checkup  Vaccinations: Influenza vaccine: completed  Pneumococcal vaccine: completed  Tdap vaccine: 01/13/2020 Shingles vaccine: will consider     Advanced directives: yes   Conditions/risks identified: none   Next appointment: none   Preventive Care 60 Years and Older, Male Preventive care refers to lifestyle choices and visits with your health care provider that can promote health and wellness. What does preventive care include? A yearly physical exam. This is also called an annual well check. Dental exams once or twice a year. Routine eye exams. Ask your health care provider how often you should have your eyes checked. Personal lifestyle choices, including: Daily care of your teeth and gums. Regular physical activity. Eating a healthy diet. Avoiding tobacco and drug use. Limiting alcohol use. Practicing safe sex. Taking low doses of aspirin every day. Taking vitamin and mineral supplements as recommended by your health care provider. What happens during an annual well check? The services and screenings done by your health care provider during your annual well check will depend on your age, overall health, lifestyle risk factors, and family history of disease. Counseling  Your health care provider may ask you questions about your: Alcohol use. Tobacco use. Drug use. Emotional well-being. Home and relationship well-being. Sexual activity. Eating habits. History of falls. Memory and ability to understand (cognition). Work and work  Statistician. Screening  You may have the following tests or measurements: Height, weight, and BMI. Blood pressure. Lipid and cholesterol levels. These may be checked every 5 years, or more frequently if you are over 14 years old. Skin check. Lung cancer screening. You may have this screening every year starting at age 50 if you have a 30-pack-year history of smoking and currently smoke or have quit within the past 15 years. Fecal occult blood test (FOBT) of the stool. You may have this test every year starting at age 81. Flexible sigmoidoscopy or colonoscopy. You may have a sigmoidoscopy every 5 years or a colonoscopy every 10 years starting at age 16. Prostate cancer screening. Recommendations will vary depending on your family history and other risks. Hepatitis C blood test. Hepatitis B blood test. Sexually transmitted disease (STD) testing. Diabetes screening. This is done by checking your blood sugar (glucose) after you have not eaten for a while (fasting). You may have this done every 1-3 years. Abdominal aortic aneurysm (AAA) screening. You may need this if you are a current or former smoker. Osteoporosis. You may be screened starting at age 23 if you are at high risk. Talk with your health care provider about your test results, treatment options, and if necessary, the need for more tests. Vaccines  Your health care provider may recommend certain vaccines, such as: Influenza vaccine. This is recommended every year. Tetanus, diphtheria, and acellular pertussis (Tdap, Td) vaccine. You may need a Td booster every 10 years. Zoster vaccine. You may need this after age 20. Pneumococcal 13-valent conjugate (PCV13) vaccine. One dose is recommended after age 36. Pneumococcal polysaccharide (PPSV23) vaccine. One dose is recommended after age 51. Talk to your health care provider about which screenings and vaccines you need and how often you  need them. This information is not intended to replace  advice given to you by your health care provider. Make sure you discuss any questions you have with your health care provider. Document Released: 07/31/2015 Document Revised: 03/23/2016 Document Reviewed: 05/05/2015 Elsevier Interactive Patient Education  2017 [place] Prevention in the Home Falls can cause injuries. They can happen to people of all ages. There are many things you can do to make your home safe and to help prevent falls. What can I do on the outside of my home? Regularly fix the edges of walkways and driveways and fix any cracks. Remove anything that might make you trip as you walk through a door, such as a raised step or threshold. Trim any bushes or trees on the path to your home. Use bright outdoor lighting. Clear any walking paths of anything that might make someone trip, such as rocks or tools. Regularly check to see if handrails are loose or broken. Make sure that both sides of any steps have handrails. Any raised decks and porches should have guardrails on the edges. Have any leaves, snow, or ice cleared regularly. Use sand or salt on walking paths during winter. Clean up any spills in your garage right away. This includes oil or grease spills. What can I do in the bathroom? Use night lights. Install grab bars by the toilet and in the tub and shower. Do not use towel bars as grab bars. Use non-skid mats or decals in the tub or shower. If you need to sit down in the shower, use a plastic, non-slip stool. Keep the floor dry. Clean up any water that spills on the floor as soon as it happens. Remove soap buildup in the tub or shower regularly. Attach bath mats securely with double-sided non-slip rug tape. Do not have throw rugs and other things on the floor that can make you trip. What can I do in the bedroom? Use night lights. Make sure that you have a light by your bed that is easy to reach. Do not use any sheets or blankets that are too big for your bed.  They should not hang down onto the floor. Have a firm chair that has side arms. You can use this for support while you get dressed. Do not have throw rugs and other things on the floor that can make you trip. What can I do in the kitchen? Clean up any spills right away. Avoid walking on wet floors. Keep items that you use a lot in easy-to-reach places. If you need to reach something above you, use a strong step stool that has a grab bar. Keep electrical cords out of the way. Do not use floor polish or wax that makes floors slippery. If you must use wax, use non-skid floor wax. Do not have throw rugs and other things on the floor that can make you trip. What can I do with my stairs? Do not leave any items on the stairs. Make sure that there are handrails on both sides of the stairs and use them. Fix handrails that are broken or loose. Make sure that handrails are as long as the stairways. Check any carpeting to make sure that it is firmly attached to the stairs. Fix any carpet that is loose or worn. Avoid having throw rugs at the top or bottom of the stairs. If you do have throw rugs, attach them to the floor with carpet tape. Make sure that you have a light switch  at the top of the stairs and the bottom of the stairs. If you do not have them, ask someone to add them for you. What else can I do to help prevent falls? Wear shoes that: Do not have high heels. Have rubber bottoms. Are comfortable and fit you well. Are closed at the toe. Do not wear sandals. If you use a stepladder: Make sure that it is fully opened. Do not climb a closed stepladder. Make sure that both sides of the stepladder are locked into place. Ask someone to hold it for you, if possible. Clearly mark and make sure that you can see: Any grab bars or handrails. First and last steps. Where the edge of each step is. Use tools that help you move around (mobility aids) if they are needed. These  include: Canes. Walkers. Scooters. Crutches. Turn on the lights when you go into a dark area. Replace any light bulbs as soon as they burn out. Set up your furniture so you have a clear path. Avoid moving your furniture around. If any of your floors are uneven, fix them. If there are any pets around you, be aware of where they are. Review your medicines with your doctor. Some medicines can make you feel dizzy. This can increase your chance of falling. Ask your doctor what other things that you can do to help prevent falls. This information is not intended to replace advice given to you by your health care provider. Make sure you discuss any questions you have with your health care provider. Document Released: 04/30/2009 Document Revised: 12/10/2015 Document Reviewed: 08/08/2014 Elsevier Interactive Patient Education  2017 [employer].

## 2022-02-16 DIAGNOSIS — Z6833 Body mass index (BMI) 33.0-33.9, adult: Secondary | ICD-10-CM | POA: Diagnosis not present

## 2022-02-16 DIAGNOSIS — M542 Cervicalgia: Secondary | ICD-10-CM | POA: Diagnosis not present

## 2022-02-16 DIAGNOSIS — M5030 Other cervical disc degeneration, unspecified cervical region: Secondary | ICD-10-CM | POA: Diagnosis not present

## 2022-02-16 DIAGNOSIS — M5416 Radiculopathy, lumbar region: Secondary | ICD-10-CM | POA: Diagnosis not present

## 2022-02-17 ENCOUNTER — Ambulatory Visit (HOSPITAL_BASED_OUTPATIENT_CLINIC_OR_DEPARTMENT_OTHER): Payer: Medicare Other | Attending: Neurosurgery | Admitting: Physical Therapy

## 2022-02-17 DIAGNOSIS — R2689 Other abnormalities of gait and mobility: Secondary | ICD-10-CM | POA: Insufficient documentation

## 2022-02-17 DIAGNOSIS — M542 Cervicalgia: Secondary | ICD-10-CM | POA: Insufficient documentation

## 2022-02-17 DIAGNOSIS — M6281 Muscle weakness (generalized): Secondary | ICD-10-CM | POA: Insufficient documentation

## 2022-03-07 ENCOUNTER — Ambulatory Visit: Payer: BC Managed Care – PPO | Admitting: Skilled Nursing Facility1

## 2022-03-08 ENCOUNTER — Ambulatory Visit (INDEPENDENT_AMBULATORY_CARE_PROVIDER_SITE_OTHER): Payer: Medicare Other | Admitting: Internal Medicine

## 2022-03-08 ENCOUNTER — Other Ambulatory Visit: Payer: Self-pay

## 2022-03-08 ENCOUNTER — Encounter: Payer: Self-pay | Admitting: Internal Medicine

## 2022-03-08 VITALS — BP 142/86 | HR 66 | Temp 97.8°F | Resp 17 | Ht 73.0 in | Wt 258.4 lb

## 2022-03-08 DIAGNOSIS — R9431 Abnormal electrocardiogram [ECG] [EKG]: Secondary | ICD-10-CM

## 2022-03-08 DIAGNOSIS — N1831 Chronic kidney disease, stage 3a: Secondary | ICD-10-CM

## 2022-03-08 DIAGNOSIS — M1030 Gout due to renal impairment, unspecified site: Secondary | ICD-10-CM

## 2022-03-08 DIAGNOSIS — Z720 Tobacco use: Secondary | ICD-10-CM

## 2022-03-08 DIAGNOSIS — I10 Essential (primary) hypertension: Secondary | ICD-10-CM | POA: Diagnosis not present

## 2022-03-08 DIAGNOSIS — R82998 Other abnormal findings in urine: Secondary | ICD-10-CM

## 2022-03-08 DIAGNOSIS — N289 Disorder of kidney and ureter, unspecified: Secondary | ICD-10-CM | POA: Diagnosis not present

## 2022-03-08 DIAGNOSIS — R002 Palpitations: Secondary | ICD-10-CM | POA: Diagnosis not present

## 2022-03-08 LAB — BASIC METABOLIC PANEL WITH GFR
BUN: 14 mg/dL (ref 6–23)
CO2: 29 meq/L (ref 19–32)
Calcium: 9.1 mg/dL (ref 8.4–10.5)
Chloride: 100 meq/L (ref 96–112)
Creatinine, Ser: 1.04 mg/dL (ref 0.40–1.50)
GFR: 73.27 mL/min
Glucose, Bld: 150 mg/dL — ABNORMAL HIGH (ref 70–99)
Potassium: 3.8 meq/L (ref 3.5–5.1)
Sodium: 137 meq/L (ref 135–145)

## 2022-03-08 LAB — URIC ACID: Uric Acid, Serum: 6.9 mg/dL (ref 4.0–7.8)

## 2022-03-08 MED ORDER — INDAPAMIDE 1.25 MG PO TABS: 1.2500 mg | ORAL_TABLET | Freq: Every day | ORAL | 0 refills | Status: DC

## 2022-03-08 MED ORDER — ALLOPURINOL 100 MG PO TABS: 100.0000 mg | ORAL_TABLET | Freq: Every day | ORAL | 1 refills | Status: DC

## 2022-03-08 MED ORDER — SHINGRIX 50 MCG/0.5ML IM SUSR: 0.5000 mL | Freq: Once | INTRAMUSCULAR | 1 refills | Status: AC

## 2022-03-08 MED ORDER — NICOTINE 21 MG/24HR TD PT24
21.0000 mg | MEDICATED_PATCH | Freq: Every day | TRANSDERMAL | 0 refills | Status: AC
Start: 2022-03-08 — End: 2022-04-08

## 2022-03-08 MED ORDER — NICOTINE 7 MG/24HR TD PT24: 7.0000 mg | MEDICATED_PATCH | Freq: Every day | TRANSDERMAL | 0 refills | Status: AC

## 2022-03-08 MED ORDER — NICOTINE 14 MG/24HR TD PT24: 14.0000 mg | MEDICATED_PATCH | Freq: Every day | TRANSDERMAL | 0 refills | Status: DC

## 2022-03-08 NOTE — Patient Instructions (Signed)
Hypertension, Adult High blood pressure (hypertension) is when the force of blood pumping through the arteries is too strong. The arteries are the blood vessels that carry blood from the heart throughout the body. Hypertension forces the heart to work harder to pump blood and may cause arteries to become narrow or stiff. Untreated or uncontrolled hypertension can lead to a heart attack, heart failure, a stroke, kidney disease, and other problems. A blood pressure reading consists of a higher number over a lower number. Ideally, your blood pressure should be below 120/80. The first ("top") number is called the systolic pressure. It is a measure of the pressure in your arteries as your heart beats. The second ("bottom") number is called the diastolic pressure. It is a measure of the pressure in your arteries as the heart relaxes. What are the causes? The exact cause of this condition is not known. There are some conditions that result in high blood pressure. What increases the risk? Certain factors may make you more likely to develop high blood pressure. Some of these risk factors are under your control, including: Smoking. Not getting enough exercise or physical activity. Being overweight. Having too much fat, sugar, calories, or salt (sodium) in your diet. Drinking too much alcohol. Other risk factors include: Having a personal history of heart disease, diabetes, high cholesterol, or kidney disease. Stress. Having a family history of high blood pressure and high cholesterol. Having obstructive sleep apnea. Age. The risk increases with age. What are the signs or symptoms? High blood pressure may not cause symptoms. Very high blood pressure (hypertensive crisis) may cause: Headache. Fast or irregular heartbeats (palpitations). Shortness of breath. Nosebleed. Nausea and vomiting. Vision changes. Severe chest pain, dizziness, and seizures. How is this diagnosed? This condition is diagnosed by  measuring your blood pressure while you are seated, with your arm resting on a flat surface, your legs uncrossed, and your feet flat on the floor. The cuff of the blood pressure monitor will be placed directly against the skin of your upper arm at the level of your heart. Blood pressure should be measured at least twice using the same arm. Certain conditions can cause a difference in blood pressure between your right and left arms. If you have a high blood pressure reading during one visit or you have normal blood pressure with other risk factors, you may be asked to: Return on a different day to have your blood pressure checked again. Monitor your blood pressure at home for 1 week or longer. If you are diagnosed with hypertension, you may have other blood or imaging tests to help your health care provider understand your overall risk for other conditions. How is this treated? This condition is treated by making healthy lifestyle changes, such as eating healthy foods, exercising more, and reducing your alcohol intake. You may be referred for counseling on a healthy diet and physical activity. Your health care provider may prescribe medicine if lifestyle changes are not enough to get your blood pressure under control and if: Your systolic blood pressure is above 130. Your diastolic blood pressure is above 80. Your personal target blood pressure may vary depending on your medical conditions, your age, and other factors. Follow these instructions at home: Eating and drinking  Eat a diet that is high in fiber and potassium, and low in sodium, added sugar, and fat. An example of this eating plan is called the DASH diet. DASH stands for Dietary Approaches to Stop Hypertension. To eat this way: Eat   plenty of fresh fruits and vegetables. Try to fill one half of your plate at each meal with fruits and vegetables. Eat whole grains, such as whole-wheat pasta, brown rice, or whole-grain bread. Fill about one  fourth of your plate with whole grains. Eat or drink low-fat dairy products, such as skim milk or low-fat yogurt. Avoid fatty cuts of meat, processed or cured meats, and poultry with skin. Fill about one fourth of your plate with lean proteins, such as fish, chicken without skin, beans, eggs, or tofu. Avoid pre-made and processed foods. These tend to be higher in sodium, added sugar, and fat. Reduce your daily sodium intake. Many people with hypertension should eat less than 1,500 mg of sodium a day. Do not drink alcohol if: Your health care provider tells you not to drink. You are pregnant, may be pregnant, or are planning to become pregnant. If you drink alcohol: Limit how much you have to: 0-1 drink a day for women. 0-2 drinks a day for men. Know how much alcohol is in your drink. In the [place] one drink equals one 12 oz bottle of beer (355 mL), one 5 oz glass of wine (148 mL), or one 1 oz glass of hard liquor (44 mL). Lifestyle  Work with your health care provider to maintain a healthy body weight or to lose weight. Ask what an ideal weight is for you. Get at least 30 minutes of exercise that causes your heart to beat faster (aerobic exercise) most days of the week. Activities may include walking, swimming, or biking. Include exercise to strengthen your muscles (resistance exercise), such as Pilates or lifting weights, as part of your weekly exercise routine. Try to do these types of exercises for 30 minutes at least 3 days a week. Do not use any products that contain nicotine or tobacco. These products include cigarettes, chewing tobacco, and vaping devices, such as e-cigarettes. If you need help quitting, ask your health care provider. Monitor your blood pressure at home as told by your health care provider. Keep all follow-up visits. This is important. Medicines Take over-the-counter and prescription medicines only as told by your health care provider. Follow directions carefully. Blood  pressure medicines must be taken as prescribed. Do not skip doses of blood pressure medicine. Doing this puts you at risk for problems and can make the medicine less effective. Ask your health care provider about side effects or reactions to medicines that you should watch for. Contact a health care provider if you: Think you are having a reaction to a medicine you are taking. Have headaches that keep coming back (recurring). Feel dizzy. Have swelling in your ankles. Have trouble with your vision. Get help right away if you: Develop a severe headache or confusion. Have unusual weakness or numbness. Feel faint. Have severe pain in your chest or abdomen. Vomit repeatedly. Have trouble breathing. These symptoms may be an emergency. Get help right away. Call 911. Do not wait to see if the symptoms will go away. Do not drive yourself to the hospital. Summary Hypertension is when the force of blood pumping through your arteries is too strong. If this condition is not controlled, it may put you at risk for serious complications. Your personal target blood pressure may vary depending on your medical conditions, your age, and other factors. For most people, a normal blood pressure is less than 120/80. Hypertension is treated with lifestyle changes, medicines, or a combination of both. Lifestyle changes include losing weight, eating a healthy,   low-sodium diet, exercising more, and limiting alcohol. This information is not intended to replace advice given to you by your health care provider. Make sure you discuss any questions you have with your health care provider. Document Revised: 05/11/2021 Document Reviewed: 05/11/2021 Elsevier Patient Education  2023 Elsevier Inc.  

## 2022-03-08 NOTE — Progress Notes (Unsigned)
Subjective:  [name]., male    DOB: [birth year]  Age: 71 y.o. MRN: [id number]  CC: Diabetes and Hypertension   HPI Anthony Daniel. presents for ***  Outpatient Medications Prior to Visit  Medication Sig Dispense Refill   aspirin EC 81 MG tablet Take 81 mg by mouth in the morning.     candesartan (ATACAND) 16 MG tablet TAKE 1 TABLET BY MOUTH EVERY DAY 90 tablet 0   Ciclopirox 0.77 % gel APPLY TO AFFECTED AREA TWICE A DAY 30 g 2   Continuous Blood Gluc Receiver (DEXCOM G6 RECEIVER) DEVI See admin instructions.     Continuous Blood Gluc Sensor (DEXCOM G6 SENSOR) MISC change sensor     Continuous Blood Gluc Transmit (DEXCOM G6 TRANSMITTER) MISC See admin instructions.     ezetimibe (ZETIA) 10 MG tablet TAKE 1 TABLET BY MOUTH EVERY DAY 90 tablet 1   glucose blood (ONETOUCH VERIO) test strip 5X A DAY     GVOKE HYPOPEN 2-PACK 1 MG/0.2ML SOAJ INJECT 1 ACT INTO THE SKIN DAILY AS NEEDED (HYPOGLYCEMIA). 0.2 mL 3   Insulin Glargine (LANTUS SOLOSTAR [place]) Inject into the skin 2 (two) times daily. 25 UNITS IN AM AND  20--25 UNITS;  WHEN FASTING OR UNABLE TO EAT LOWER TO 18 UNITS TWICE DAILY     Insulin Human (INSULIN PUMP) SOLN Inject 1 each into the skin continuous. Novolog insulin     levocetirizine (XYZAL) 5 MG tablet TAKE 1 TABLET BY MOUTH DAILY AS NEEDED FOR ALLERGIES. 90 tablet 1   neomycin-polymyxin-hydrocortisone (CORTISPORIN) 3.5-10000-1 OTIC suspension Place 3 drops into both ears 3 (three) times daily. 10 mL 2   NOVOLOG FLEXPEN 100 UNIT/ML FlexPen Inject 12-15 Units into the skin 3 (three) times daily before meals. WHEN NOT USING INSULIN PUMP ;  WHEN FASTING OR UNABLE TO EAT ONLY WHEN NEEDED TO TREAT BLOOD SUGAR >200     omeprazole (PRILOSEC) 20 MG capsule Take 1 capsule (20 mg total) by mouth daily. 90 capsule 1   Polyethyl Glycol-Propyl Glycol (SYSTANE OP) Place 1 drop into both eyes 3 (three) times daily as needed (dry/irritated eyes).     Potassium Citrate 15 MEQ  (1620 MG) TBCR Take 1 tablet by mouth in the morning and at bedtime.     tamsulosin (FLOMAX) 0.4 MG CAPS capsule Take 0.4 mg by mouth in the morning.     allopurinol (ZYLOPRIM) 100 MG tablet Take 1 tablet (100 mg total) by mouth daily. 90 tablet 1   indapamide (LOZOL) 1.25 MG tablet TAKE 1 TABLET BY MOUTH EVERY DAY 90 tablet 0   insulin aspart (NOVOLOG) 100 UNIT/ML injection Inject into the skin as directed. USE VIA INSULIN PUMP TO ADMINISTER UP TO 130 UNITS MAXIUM DAILY (Patient not taking: Reported on 03/08/2022)     amoxicillin-clavulanate (AUGMENTIN) 875-125 MG tablet Take 1 tablet by mouth 2 (two) times daily. (Patient not taking: Reported on 03/08/2022) 20 tablet 0   ondansetron (ZOFRAN ODT) 4 MG disintegrating tablet Take 1 tablet (4 mg total) by mouth every 8 (eight) hours as needed for nausea or vomiting. (Patient not taking: Reported on 03/08/2022) 12 tablet 0   No facility-administered medications prior to visit.    ROS Review of Systems  Objective:  BP (!) 142/86   Pulse 66   Temp 97.8 F (36.6 C) (Temporal)   Resp 17   Ht '6\' 1"'$  (1.854 m)   Wt 258 lb 6.4 oz (117.2 kg)   SpO2  97%   BMI 34.09 kg/m   BP Readings from Last 3 Encounters:  03/08/22 (!) 142/86  09/16/21 130/72  09/07/21 128/80    Wt Readings from Last 3 Encounters:  03/08/22 258 lb 6.4 oz (117.2 kg)  09/16/21 252 lb (114.3 kg)  09/07/21 252 lb (114.3 kg)    Physical Exam Cardiovascular:     Comments: EKG- SR with PSVT complexes LAD, anterior infarct pattern is old No LVH Musculoskeletal:     Right lower leg: No edema.     Left lower leg: No edema.     Lab Results  Component Value Date   WBC 8.5 09/07/2021   HGB 13.8 09/07/2021   HCT 41.8 09/07/2021   PLT 183.0 09/07/2021   GLUCOSE 150 (H) 03/08/2022   CHOL 155 09/07/2021   TRIG 93.0 09/07/2021   HDL 43.70 09/07/2021   LDLCALC 93 09/07/2021   ALT 17 07/12/2021   AST 20 07/12/2021   NA 137 03/08/2022   K 3.8 03/08/2022   CL 100  03/08/2022   CREATININE 1.04 03/08/2022   BUN 14 03/08/2022   CO2 29 03/08/2022   TSH 0.82 09/07/2021   PSA 0.3 12/01/2021   INR 0.9 02/05/2021   HGBA1C 8.3 12/10/2021   MICROALBUR 1.4 09/07/2021    DG C-Arm 1-60 Min-No Report  Result Date: 07/13/2021 Fluoroscopy was utilized by the requesting physician.  No radiographic interpretation.   DG C-Arm 1-60 Min-No Report  Result Date: 07/13/2021 Fluoroscopy was utilized by the requesting physician.  No radiographic interpretation.    Assessment & Plan:   Vadim was seen today for diabetes and hypertension.  Diagnoses and all orders for this visit:  Essential hypertension -     Basic metabolic panel; Future -     Basic metabolic panel -     indapamide (LOZOL) 1.25 MG tablet; Take 1 tablet (1.25 mg total) by mouth daily.  Tobacco abuse -     nicotine (NICODERM CQ) 21 mg/24hr patch; Place 1 patch (21 mg total) onto the skin daily. -     nicotine (NICODERM CQ) 14 mg/24hr patch; Place 1 patch (14 mg total) onto the skin daily. -     nicotine (NICODERM CQ) 7 mg/24hr patch; Place 1 patch (7 mg total) onto the skin daily. -     Ambulatory Referral for Lung Cancer Scre  Abnormal electrocardiogram -     MYOCARDIAL PERFUSION IMAGING; Future -     Cardiac Stress Test: Informed Consent Details: Physician/Practitioner Attestation; Transcribe to consent form and obtain patient signature; Future  Chronic kidney disease, stage 3a ([place]) -     Basic metabolic panel; Future -     Basic metabolic panel  Uric acid crystalluria -     Uric acid; Future -     Uric acid -     allopurinol (ZYLOPRIM) 100 MG tablet; Take 1 tablet (100 mg total) by mouth daily.  Gouty renal disease -     allopurinol (ZYLOPRIM) 100 MG tablet; Take 1 tablet (100 mg total) by mouth daily.  Other orders -     Zoster Vaccine Adjuvanted [facility]) injection; Inject 0.5 mLs into the muscle once for 1 dose.   I have discontinued Hailey T. Jesus Genera. "Tommy"'s  ondansetron and amoxicillin-clavulanate. I have also changed his indapamide. Additionally, I am having him start on nicotine, nicotine, nicotine, and Shingrix. Lastly, I am having him maintain his Insulin Glargine (LANTUS SOLOSTAR ), aspirin EC, Polyethyl Glycol-Propyl Glycol (SYSTANE OP), insulin pump, OneTouch Verio,  Dexcom G6 Receiver, Dexcom G6 Sensor, Dexcom G6 Transmitter, NovoLOG FlexPen, tamsulosin, Potassium Citrate, insulin aspart, neomycin-polymyxin-hydrocortisone, omeprazole, Gvoke HypoPen 2-Pack, Ciclopirox, candesartan, ezetimibe, levocetirizine, and allopurinol.  Meds ordered this encounter  Medications   nicotine (NICODERM CQ) 21 mg/24hr patch    Sig: Place 1 patch (21 mg total) onto the skin daily.    Dispense:  28 patch    Refill:  0   nicotine (NICODERM CQ) 14 mg/24hr patch    Sig: Place 1 patch (14 mg total) onto the skin daily.    Dispense:  28 patch    Refill:  0   nicotine (NICODERM CQ) 7 mg/24hr patch    Sig: Place 1 patch (7 mg total) onto the skin daily.    Dispense:  28 patch    Refill:  0   Zoster Vaccine Adjuvanted [facility]) injection    Sig: Inject 0.5 mLs into the muscle once for 1 dose.    Dispense:  0.5 mL    Refill:  1   allopurinol (ZYLOPRIM) 100 MG tablet    Sig: Take 1 tablet (100 mg total) by mouth daily.    Dispense:  90 tablet    Refill:  1   indapamide (LOZOL) 1.25 MG tablet    Sig: Take 1 tablet (1.25 mg total) by mouth daily.    Dispense:  90 tablet    Refill:  0     Follow-up: Return in about 3 months (around 06/08/2022).  Scarlette Calico, MD

## 2022-03-09 ENCOUNTER — Encounter: Payer: Self-pay | Admitting: Internal Medicine

## 2022-03-09 DIAGNOSIS — R9431 Abnormal electrocardiogram [ECG] [EKG]: Secondary | ICD-10-CM | POA: Insufficient documentation

## 2022-03-09 DIAGNOSIS — R002 Palpitations: Secondary | ICD-10-CM | POA: Insufficient documentation

## 2022-03-10 ENCOUNTER — Encounter: Payer: Self-pay | Admitting: Internal Medicine

## 2022-03-11 ENCOUNTER — Ambulatory Visit: Payer: Self-pay

## 2022-03-11 NOTE — Patient Outreach (Unsigned)
  Care Coordination   Initial Visit Note   03/11/2022 Name: Anthony Daniel. MRN: [id number] DOB: May 04, 1951  Anthony Daniel. is a 71 y.o. year old male who sees Janith Lima, MD for primary care. I spoke with  [name] by phone today.  What matters to the patients health and wellness today?  Maintain health-Cardiac evaluations pending   Goals Addressed             This Visit's Progress    Maintain or improve health-currently being evaluated for cardiac health       Care Coordination Interventions: Advised patient to contact provider for any health questions or concerns Encouraged patient to contact provider office if he does not hear from them regarding pending test schedule Encouraged to continue to eat Healthy Reviewed upcoming appointments         SDOH assessments and interventions completed:  Yes{THN Tip this will not be part of the note when signed-REQUIRED REPORT FIELD DO NOT DELETE (Optional):27901}  SDOH Interventions Today    Flowsheet Row Most Recent Value  SDOH Interventions   Food Insecurity Interventions Intervention Not Indicated  Transportation Interventions Intervention Not Indicated        Care Coordination Interventions Activated:  No {THN Tip this will not be part of the note when signed-REQUIRED REPORT FIELD DO NOT DELETE (Optional):27901} Care Coordination Interventions:  No, not indicated {THN Tip this will not be part of the note when signed-REQUIRED REPORT FIELD DO NOT DELETE (Optional):27901}  Follow up plan: Follow up call scheduled for 04/15/22    Encounter Outcome:  Pt. Visit Completed {THN Tip this will not be part of the note when signed-REQUIRED REPORT FIELD DO NOT DELETE (Optional):27901}  Thea Silversmith, RN, MSN, BSN, [place] Coordinator [phone]

## 2022-03-14 ENCOUNTER — Encounter: Payer: Medicare Other | Attending: Internal Medicine | Admitting: Skilled Nursing Facility1

## 2022-03-14 ENCOUNTER — Encounter: Payer: Self-pay | Admitting: Skilled Nursing Facility1

## 2022-03-14 DIAGNOSIS — N1831 Chronic kidney disease, stage 3a: Secondary | ICD-10-CM | POA: Insufficient documentation

## 2022-03-14 DIAGNOSIS — E139 Other specified diabetes mellitus without complications: Secondary | ICD-10-CM | POA: Insufficient documentation

## 2022-03-14 NOTE — Progress Notes (Signed)
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  Pt states he was on a vacation from his pump for a while but states he will get back to using it.  Pt states he does have the nicotine patch but has not started yet with plans to start it in the future.  Pt states he focuses on a low oxolate diet to prevent kidney stones stating he does not have an issue with gout.  Pt states he leaves his foods to his wife. Pt states he started checking his blood pressure daily tro ensure his bp was in noraml limits.   Pt states he did pick up picking on things like chips recently but was aware of enough to catch it.   Estimated daily fluid intake: 64 oz Supplements: N/A Sleep: OSA Stress / self-care: Stressed about DM and kidney stones. Current average weekly physical activity:    24-Hr Dietary Recall First Meal: 2 eggs, 2 slices of bacon, 1 slice of honey wheat toast, 24 oz. water Snack:  Second Meal: toast Snack: none Third Meal: Salad, Shrimp, 2 slices of bread w/ olive oil, 24 oz. water Snack: none Beverages: (70oz) water: sometimes lemon, coffee sometimes     NUTRITION INTERVENTION  Nutrition education (E-1) on the following topics:  Continued: Educated patient on the role of animal based protein on uric acid levels. Educated patient on appropriate serving sizes of protein choices. Advised patient to increase their water  intake to a minimum of 64 oz each day. Educated pt on the addition of 2 ounces diluted in water of lemon juice 2 times a day to assist in kidney stone formation Educated pt on the importance of constantly eating and avoiding meal sipping for blood sugar control Educated pt on vitamin C containing foods due to research supporting a reduction in vitamin C in those that smoke   Handouts Previously Provided Include  Kidney Stone Nutrition Therapy Nutrition Care Manual High Oxalate Food List ADA: How to Thrive with Diabetes Low Purine/Purine-Restricted Nutrition Therapy Nutrition Care Manual   Learning Style & Readiness for Change Teaching method utilized: Visual & Auditory  Demonstrated degree of understanding via: Teach Back  Barriers to learning/adherence to lifestyle change: None   Goals previously Established by Pt Work on reducing your consumption of red meat and pork and focus on more lean poultry and seafood as well as plant sources of protein such as beans, black eyed peas, lentils, lima beans, etc. Work on increasing your vegetable intake. Keep some frozen vegetables in your freezer, try "Steam In Bag" vegetables, be mindful of frozen veggies that say "Seasoned" or "In Sauce/Butter" Try adding in a bottle of alkaline water each day. Keep up the great job increasing your water intake, you are very close to your goal of 70 oz a day. Use the divided plates that you have to prepare balanced meals. Pay attention to your portions of carbohydrates using this method to make sure your meal-time injections are appropriate. A  possible snack such as an apple  Increase your plant based protein sources to reduce the animal based options Increase your calcium consumption from dairy products or a calcium citrate supplement if advisable by your doctor   MONITORING & EVALUATION Dietary intake, weekly physical activity, and oxalate and purine intake in 3 months  Next Steps  Patient is to follow  up with RD.

## 2022-03-16 DIAGNOSIS — E1022 Type 1 diabetes mellitus with diabetic chronic kidney disease: Secondary | ICD-10-CM | POA: Diagnosis not present

## 2022-03-16 DIAGNOSIS — N182 Chronic kidney disease, stage 2 (mild): Secondary | ICD-10-CM | POA: Diagnosis not present

## 2022-03-16 DIAGNOSIS — Z720 Tobacco use: Secondary | ICD-10-CM | POA: Diagnosis not present

## 2022-03-16 DIAGNOSIS — E785 Hyperlipidemia, unspecified: Secondary | ICD-10-CM | POA: Diagnosis not present

## 2022-03-16 DIAGNOSIS — Z794 Long term (current) use of insulin: Secondary | ICD-10-CM | POA: Diagnosis not present

## 2022-03-16 DIAGNOSIS — Z87442 Personal history of urinary calculi: Secondary | ICD-10-CM | POA: Diagnosis not present

## 2022-03-17 ENCOUNTER — Encounter: Payer: Self-pay | Admitting: Internal Medicine

## 2022-03-18 NOTE — Addendum Note (Signed)
Addended by: Hinda Kehr on: 03/18/2022 09:22 AM   Modules accepted: Orders

## 2022-03-24 ENCOUNTER — Encounter: Payer: Self-pay | Admitting: Internal Medicine

## 2022-03-24 ENCOUNTER — Ambulatory Visit: Payer: Medicare Other | Attending: Internal Medicine

## 2022-03-24 ENCOUNTER — Ambulatory Visit (INDEPENDENT_AMBULATORY_CARE_PROVIDER_SITE_OTHER): Payer: Medicare Other

## 2022-03-24 DIAGNOSIS — R9431 Abnormal electrocardiogram [ECG] [EKG]: Secondary | ICD-10-CM | POA: Diagnosis not present

## 2022-03-24 DIAGNOSIS — R002 Palpitations: Secondary | ICD-10-CM

## 2022-03-24 NOTE — Progress Notes (Signed)
I was able to place Heart monitor on pt and educate pt on the care of going forward with his heart monitor along with how to clean area and what to do to change out adhesive and how to charge device and the phone that came with the device.  Pt acknowledge that he understands how to care for heart monitor and has no more questions at this time. Pt was given [facility] on [employer] information to follow up with. Pt is calling today to set up an appointment.

## 2022-03-25 ENCOUNTER — Other Ambulatory Visit: Payer: Self-pay | Admitting: Internal Medicine

## 2022-03-25 ENCOUNTER — Encounter: Payer: Self-pay | Admitting: Internal Medicine

## 2022-03-25 DIAGNOSIS — R002 Palpitations: Secondary | ICD-10-CM

## 2022-03-25 DIAGNOSIS — R9431 Abnormal electrocardiogram [ECG] [EKG]: Secondary | ICD-10-CM

## 2022-03-25 NOTE — Addendum Note (Signed)
Addended by: Marijean Heath R on: 03/25/2022 03:44 PM   Modules accepted: Level of Service

## 2022-03-28 ENCOUNTER — Other Ambulatory Visit: Payer: Self-pay | Admitting: Internal Medicine

## 2022-03-28 DIAGNOSIS — I10 Essential (primary) hypertension: Secondary | ICD-10-CM

## 2022-03-28 DIAGNOSIS — N200 Calculus of kidney: Secondary | ICD-10-CM | POA: Diagnosis not present

## 2022-03-28 DIAGNOSIS — E139 Other specified diabetes mellitus without complications: Secondary | ICD-10-CM

## 2022-03-29 DIAGNOSIS — Z6834 Body mass index (BMI) 34.0-34.9, adult: Secondary | ICD-10-CM | POA: Diagnosis not present

## 2022-03-29 DIAGNOSIS — E669 Obesity, unspecified: Secondary | ICD-10-CM | POA: Diagnosis not present

## 2022-03-29 DIAGNOSIS — E119 Type 2 diabetes mellitus without complications: Secondary | ICD-10-CM | POA: Diagnosis not present

## 2022-03-29 DIAGNOSIS — Z713 Dietary counseling and surveillance: Secondary | ICD-10-CM | POA: Diagnosis not present

## 2022-03-30 ENCOUNTER — Telehealth: Payer: Self-pay | Admitting: Physician Assistant

## 2022-03-30 NOTE — Telephone Encounter (Signed)
Paged by answering service.  Patient currently wearing monitored which was ordered by PCP for palpitation.  Has pending cardiology evaluation.  Patient had 13 beats of nonsustained VT today.  Underlying rhythm sinus.    I have recommended device company to send reading to PCP.  I did not called the patient as he was never seen by cardiologist.

## 2022-03-31 ENCOUNTER — Telehealth: Payer: Self-pay

## 2022-03-31 NOTE — Telephone Encounter (Signed)
   Cardiac Monitor Alert  Date of alert:  03/31/2022   Patient Name: Anthony Daniel.  DOB: 07-09-1951  MRN: [id number]   [place] Cardiologist: Will be seeing Dr. Johney Frame as a new patient in October [place] EP:  None    Monitor Information: Cardiac Event Monitor [[employer]]  Reason:  Palpations, Abnormal EKG Ordering provider:  Dr. Ronnald Ramp, patient's PCP   Alert Ventricular Tachycardia This is the 1st alert for this rhythm.  Sinus Rhythm with run of V-Tach (13 beats) bpm 162 then back to 70  Next Cardiology Appointment   Date:  04/26/22  Provider:  Dr. Johney Frame  The patient was contacted today.  He is asymptomatic. Arrhythmia, symptoms and history reviewed with Dr. Ali Lowe.   Plan:  Continue to monitor and keep appointment in October with Dr. Johney Frame.     Michaelyn Barter, RN  03/31/2022 9:34 AM

## 2022-04-01 ENCOUNTER — Other Ambulatory Visit: Payer: Self-pay | Admitting: Internal Medicine

## 2022-04-01 DIAGNOSIS — Z720 Tobacco use: Secondary | ICD-10-CM

## 2022-04-13 ENCOUNTER — Ambulatory Visit: Payer: Self-pay

## 2022-04-13 NOTE — Patient Outreach (Signed)
  Care Coordination   Initial Visit Note   04/13/2022 Name: Cephas Revard. MRN: [id number] DOB: 30-Jan-1951  Demian Maisel. is a 71 y.o. year old male who sees Janith Lima, MD for primary care. I spoke with  [name] by phone today.  What matters to the patients health and wellness today?  Patient reports he is wearing a heart monitor at this time and has a follow up with his cardiologist on 04/26/22. He denies any questions or concerns at this time.   Goals Addressed             This Visit's Progress    Maintain or improve health-currently being evaluated for cardiac health       Care Coordination Interventions: Reviewed upcoming appointments Encouraged to continue to eat Healthy Encouraged to contact provider for any concerning health questions      SDOH assessments and interventions completed:  No  Care Coordination Interventions Activated:  Yes  Care Coordination Interventions:  Yes, provided   Follow up plan: Follow up call scheduled for 05/11/22    Encounter Outcome:  Pt. Visit Completed   Thea Silversmith, RN, MSN, BSN, [place] Coordinator [phone]

## 2022-04-13 NOTE — Patient Instructions (Signed)
Visit Information  Thank you for taking time to visit with me today. Please don't hesitate to contact me if I can be of assistance to you.   Following are the goals we discussed today:   Goals Addressed             This Visit's Progress    Maintain or improve health-currently being evaluated for cardiac health       Care Coordination Interventions: Reviewed upcoming appointments Encouraged to continue to eat Healthy Encouraged to contact provider for any concerning health questions      Our next appointment is by telephone on 05/11/22 at 10:00 am  Please call the care guide team at [phone] if you need to cancel or reschedule your appointment.   If you are experiencing a Mental Health or [place] or need someone to talk to, please call the Suicide and Crisis Lifeline: 988  Patient verbalizes understanding of instructions and care plan provided today and agrees to view in [place]. Active MyChart status and patient understanding of how to access instructions and care plan via MyChart confirmed with patient.     Thea Silversmith, RN, MSN, BSN, [place] Coordinator [phone]

## 2022-04-18 DIAGNOSIS — N289 Disorder of kidney and ureter, unspecified: Secondary | ICD-10-CM | POA: Diagnosis not present

## 2022-04-18 DIAGNOSIS — R82991 Hypocitraturia: Secondary | ICD-10-CM | POA: Diagnosis not present

## 2022-04-18 DIAGNOSIS — M1030 Gout due to renal impairment, unspecified site: Secondary | ICD-10-CM | POA: Diagnosis not present

## 2022-04-18 DIAGNOSIS — N200 Calculus of kidney: Secondary | ICD-10-CM | POA: Diagnosis not present

## 2022-04-19 ENCOUNTER — Other Ambulatory Visit: Payer: Self-pay

## 2022-04-19 DIAGNOSIS — F17210 Nicotine dependence, cigarettes, uncomplicated: Secondary | ICD-10-CM

## 2022-04-19 DIAGNOSIS — Z122 Encounter for screening for malignant neoplasm of respiratory organs: Secondary | ICD-10-CM

## 2022-04-19 DIAGNOSIS — Z87891 Personal history of nicotine dependence: Secondary | ICD-10-CM

## 2022-04-22 NOTE — Progress Notes (Unsigned)
Cardiology Office Note:    Date:  04/22/2022   ID:  Anthony Daniel., DOB 1950/08/08, MRN [id number]  PCP:  Janith Lima, MD   [facility] Providers Cardiologist:  None {  Referring MD: Janith Lima, MD   History of Present Illness:    Anthony Daniel. is a 71 y.o. male with a hx of HTN, HLD, DMII and arthritis who was referred by Dr. Ronnald Ramp for further evaluation of palpitations.  Patient was seen by Dr. Ronnald Ramp on 03/08/22. Note reviewed. He was concerned as he was getting notifications from his apple watch that he was having episodes of atrial fibrillation. Was otherwise active without anginal symptoms. He recommended a lexiscan and cardiac monitor which are currently pending.   Today, ***  Past Medical History:  Diagnosis Date   Arthritis    Back   Benign localized prostatic hyperplasia with lower urinary tract symptoms (LUTS)    CKD (chronic kidney disease), stage II    History of bladder cancer    1998--  TCC   History of kidney stones    managed by urologist--- dr Jeffie Pollock, dr prerminger [facility] , and dr gutierrez-aceves '@[facility]    Hyperlipidemia    Hypertension    Insulin pump in place    02-02-2021 per pt currently not using pump, doing lantus insulin bid as directed by dr Buddy Duty   LADA (latent autoimmune diabetes in adults), managed as type 1 [facility])    endocrinologist-- dr Buddy Duty   PONV (postoperative nausea and vomiting)    Prostate cancer [facility])    urologist--- dr Jeffie Pollock--  first dx 02/ 2020 gleason 3+3, active surveillance;  bx 09-23-2020  Gleason 4+4, PSA 7.64   Spinal stenosis, cervical region    Uses self-applied continuous glucose monitoring device    DEX-COM    Past Surgical History:  Procedure Laterality Date   ANTERIOR CERVICAL DECOMP/DISCECTOMY FUSION N/A 12/15/2020   Procedure: Cervical Three-Four Cervical Four-Five Anterior cervical decompression/discectomy/fusion;  Surgeon: Erline Levine, MD;  Location: [place];  Service: Neurosurgery;   Laterality: N/A;   COLONOSCOPY  2018   x2   CYSTOSCOPY  02/09/2021   Procedure: CYSTOSCOPY FLEXIBLE;  Surgeon: Irine Seal, MD;  Location: [facility];  Service: Urology;;   CYSTOSCOPY W/ URETERAL STENT PLACEMENT Right 05/12/2015   Procedure: CYSTOSCOPY WITH STENT REPLACEMENT;  Surgeon: Cleon Gustin, MD;  Location: [facility];  Service: Urology;  Laterality: Right;   CYSTOSCOPY WITH RETROGRADE PYELOGRAM, URETEROSCOPY AND STENT PLACEMENT Right 01/23/2019   Procedure: CYSTOSCOPY WITH RIGHT RETROGRADE PYELOGRAM, URETEROSCOPY AND STENT PLACEMENT;  Surgeon: Irine Seal, MD;  Location: [facility];  Service: Urology;  Laterality: Right;   CYSTOSCOPY WITH URETEROSCOPY AND STENT PLACEMENT Right 04/23/2015   Procedure: CYSTOSCOPY WITH URETEROSCOPY RETROGRADE PYELOGRAM AND STENT PLACEMENT;  Surgeon: Cleon Gustin, MD;  Location: [facility];  Service: Urology;  Laterality: Right;   CYSTOSCOPY/RETROGRADE/URETEROSCOPY/STONE EXTRACTION WITH BASKET Right 05/12/2015   Procedure: CYSTOSCOPY/RETROGRADE/URETEROSCOPY/STONE EXTRACTION WITH BASKET;  Surgeon: Cleon Gustin, MD;  Location: [facility];  Service: Urology;  Laterality: Right;   CYSTOSCOPY/URETEROSCOPY/HOLMIUM LASER/STENT PLACEMENT Left 09/18/2018   Procedure: CYSTOSCOPY/ LEFT RETROGRADE LEFT URETEROSCOPY [place] LASER/STENT PLACEMENT;  Surgeon: Irine Seal, MD;  Location: [facility];  Service: Urology;  Laterality: Left;   CYSTOSCOPY/URETEROSCOPY/HOLMIUM LASER/STENT PLACEMENT Right 10/24/2019   Procedure: CYSTOSCOPY/URETEROSCOPY/HOLMIUM LASER/STENT PLACEMENT;  Surgeon: Irine Seal, MD;  Location: [facility];  Service: Urology;  Laterality: Right;   CYSTOSCOPY/URETEROSCOPY/HOLMIUM LASER/STENT PLACEMENT Left 01/30/2020   @  [facility]   CYSTOSCOPY/URETEROSCOPY/HOLMIUM LASER/STENT PLACEMENT Left 07/13/2021   Procedure: CYSTOSCOPY/URETEROSCOPY/HOLMIUM LASER/STENT PLACEMENT;  Surgeon: Lucas Mallow,  MD;  Location: [facility];  Service: Urology;  Laterality: Left;   EXTRACORPOREAL SHOCK WAVE LITHOTRIPSY  x2 in  2006//   x2  in 2012   HOLMIUM LASER APPLICATION Right 24/58/0998   Procedure: HOLMIUM LASER APPLICATION;  Surgeon: Cleon Gustin, MD;  Location: [facility];  Service: Urology;  Laterality: Right;   HOLMIUM LASER APPLICATION Right 33/82/5053   Procedure: HOLMIUM LASER APPLICATION;  Surgeon: Irine Seal, MD;  Location: [facility];  Service: Urology;  Laterality: Right;   IR URETERAL STENT RIGHT NEW ACCESS W/O SEP NEPHROSTOMY CATH  11/25/2019   LAPAROSCOPIC APPENDECTOMY N/A 11/07/2012   Procedure: APPENDECTOMY LAPAROSCOPIC;  Surgeon: Harl Bowie, MD;  Location: [place];  Service: General;  Laterality: N/A;   NEPHROLITHOTOMY Left 08/06/2015   Procedure: 1ST STAGE  LEFT PERCUTANEOUS NEPHROLITHOTOMY ;  Surgeon: Irine Seal, MD;  Location: [facility];  Service: Urology;  Laterality: Left;   NEPHROLITHOTOMY Right 11/26/2019   Procedure: NEPHROLITHOTOMY PERCUTANEOUS;  Surgeon: Irine Seal, MD;  Location: [facility];  Service: Urology;  Laterality: Right;   NEPHROLITHOTOMY Right 11/28/2019   Procedure: NEPHROLITHOTOMY PERCUTANEOUS SECOND LOOK;  Surgeon: Alexis Frock, MD;  Location[facility];  Service: Urology;  Laterality: Right;   RADIOACTIVE SEED IMPLANT N/A 02/09/2021   Procedure: RADIOACTIVE SEED IMPLANT/BRACHYTHERAPY IMPLANT;  Surgeon: Irine Seal, MD;  Location: [facility];  Service: Urology;  Laterality: N/A;   SPACE OAR INSTILLATION N/A 02/09/2021   Procedure: SPACE OAR INSTILLATION;  Surgeon: Irine Seal, MD;  [facility];  Service: Urology;  Laterality: N/A;   TONSILLECTOMY  1975   TRANSURETHRAL RESECTION OF BLADDER TUMOR  1998    Current Medications: No outpatient medications have been marked as taking for the 04/26/22 encounter (Appointment) with [name], MD.     Allergies:   Altace [ramipril], Crestor  [rosuvastatin], Lipitor [atorvastatin], Dilaudid [hydromorphone], Iodinated contrast media, Iodine, Latex, Oxycodone, Adhesive [tape], and Peanut-containing drug products   Social History   Socioeconomic History   Marital status: Married    Spouse name: Not on file   Number of children: Not on file   Years of education: Not on file   Highest education level: Master's degree (e.g., MA, MS, MEng, MEd, MSW, MBA)  Occupational History   Not on file  Tobacco Use   Smoking status: Every Day    Packs/day: 0.50    Years: 40.00    Total pack years: 20.00    Types: Cigarettes    Passive exposure: Current   Smokeless tobacco: Never  Vaping Use   Vaping Use: Never used  Substance and Sexual Activity   Alcohol use: Not Currently    Alcohol/week: 5.0 standard drinks of alcohol    Types: 5 Shots of liquor per week    Comment: occasional   Drug use: No   Sexual activity: Yes    Partners: Female  Other Topics Concern   Not on file  Social History Narrative   Not on file   Social Determinants of Health   Financial Resource Strain: Low Risk  (02/14/2022)   Overall Financial Resource Strain (CARDIA)    Difficulty of Paying Living Expenses: Not hard at all  Food Insecurity: No Food Insecurity (03/11/2022)   Hunger Vital Sign    Worried About Running Out of Food in the Last Year: Never true    Ran  Out of Food in the Last Year: Never true  Transportation Needs: No Transportation Needs (03/11/2022)   PRAPARE - Hydrologist (Medical): No    Lack of Transportation (Non-Medical): No  Physical Activity: Sufficiently Active (02/14/2022)   Exercise Vital Sign    Days of Exercise per Week: 5 days    Minutes of Exercise per Session: 30 min  Stress: No Stress Concern Present (02/14/2022)   [place]    Feeling of Stress : Not at all  Social Connections: Moderately Isolated (02/14/2022)   Social Connection  and Isolation Panel [NHANES]    Frequency of Communication with Friends and Family: Three times a week    Frequency of Social Gatherings with Friends and Family: Three times a week    Attends Religious Services: Never    Active Member of Clubs or Organizations: No    Attends Archivist Meetings: Never    Marital Status: Married     Family History: The patient's ***family history includes Cancer in an other family member; Diabetes in an other family member; Hypertension in an other family member. There is no history of Alcohol abuse, Drug abuse, Early death, Heart disease, Hyperlipidemia, Kidney disease, or Stroke.  ROS:   Please see the history of present illness.    *** All other systems reviewed and are negative.  EKGs/Labs/Other Studies Reviewed:    The following studies were reviewed today: ***  EKG:  EKG is *** ordered today.  The ekg ordered today demonstrates ***  Recent Labs: 07/12/2021: ALT 17 09/07/2021: Hemoglobin 13.8; Platelets 183.0; TSH 0.82 03/08/2022: BUN 14; Creatinine, Ser 1.04; Potassium 3.8; Sodium 137  Recent Lipid Panel    Component Value Date/Time   CHOL 155 09/07/2021 1101   TRIG 93.0 09/07/2021 1101   HDL 43.70 09/07/2021 1101   CHOLHDL 4 09/07/2021 1101   VLDL 18.6 09/07/2021 1101   LDLCALC 93 09/07/2021 1101     Risk Assessment/Calculations:   {Does this patient have ATRIAL FIBRILLATION?:[phone]}  No BP recorded.  {Refresh Note OR Click here to enter BP  :1}***         Physical Exam:    VS:  There were no vitals taken for this visit.    Wt Readings from Last 3 Encounters:  03/08/22 258 lb 6.4 oz (117.2 kg)  09/16/21 252 lb (114.3 kg)  09/07/21 252 lb (114.3 kg)     GEN: *** Well nourished, well developed in no acute distress HEENT: Normal NECK: No JVD; No carotid bruits LYMPHATICS: No lymphadenopathy CARDIAC: ***RRR, no murmurs, rubs, gallops RESPIRATORY:  Clear to auscultation without rales, wheezing or rhonchi   ABDOMEN: Soft, non-tender, non-distended MUSCULOSKELETAL:  No edema; No deformity  SKIN: Warm and dry NEUROLOGIC:  Alert and oriented x 3 PSYCHIATRIC:  Normal affect   ASSESSMENT:    No diagnosis found. PLAN:    In order of problems listed above:  #Palpitations: Cardiac monitor with ***. -Check TTE -Start metop ***  #HTN: -Continue candesartan '16mg'$  daily  #DMII on Insulin: -Management per PCP  #HLD: Statin intolerant. On zetia '10mg'$  daily. -Continue zetia '10mg'$  daily -? [id number]  #Tobacco Abuse: -Encourage cessation      {Are you ordering a CV Procedure (e.g. stress test, cath, DCCV, TEE, etc)?   Press F2        :[id number]}    Medication Adjustments/Labs and Tests Ordered: Current medicines are reviewed at length with the patient today.  Concerns regarding medicines are outlined above.  No orders of the defined types were placed in this encounter.  No orders of the defined types were placed in this encounter.   There are no Patient Instructions on file for this visit.   Signed, [name], MD  04/22/2022 2:19 PM    [place]

## 2022-04-26 ENCOUNTER — Encounter: Payer: Self-pay | Admitting: Cardiology

## 2022-04-26 ENCOUNTER — Ambulatory Visit: Payer: Medicare Other | Attending: Cardiology | Admitting: Cardiology

## 2022-04-26 VITALS — BP 116/62 | HR 91 | Ht 73.0 in | Wt 261.4 lb

## 2022-04-26 DIAGNOSIS — Z789 Other specified health status: Secondary | ICD-10-CM | POA: Diagnosis not present

## 2022-04-26 DIAGNOSIS — E139 Other specified diabetes mellitus without complications: Secondary | ICD-10-CM | POA: Diagnosis not present

## 2022-04-26 DIAGNOSIS — R002 Palpitations: Secondary | ICD-10-CM | POA: Diagnosis not present

## 2022-04-26 DIAGNOSIS — E785 Hyperlipidemia, unspecified: Secondary | ICD-10-CM | POA: Diagnosis not present

## 2022-04-26 DIAGNOSIS — Z79899 Other long term (current) drug therapy: Secondary | ICD-10-CM | POA: Insufficient documentation

## 2022-04-26 DIAGNOSIS — R9431 Abnormal electrocardiogram [ECG] [EKG]: Secondary | ICD-10-CM | POA: Insufficient documentation

## 2022-04-26 NOTE — Patient Instructions (Signed)
Medication Instructions:   Your physician recommends that you continue on your current medications as directed. Please refer to the Current Medication list given to you today.  *If you need a refill on your cardiac medications before your next appointment, please call your pharmacy*   You have been referred to [facility]    Testing/Procedures:  Your physician has requested that you have an echocardiogram. Echocardiography is a painless test that uses sound waves to create images of your heart. It provides your doctor with information about the size and shape of your heart and how well your heart's chambers and valves are working. This procedure takes approximately one hour. There are no restrictions for this procedure.  SCHEDULING, PLEASE SCHEDULE PATIENTS MYOVIEW HIS PCP DR. Sheilah Mins   Follow-Up: At [facility], you and your health needs are our priority.  As part of our continuing mission to provide you with exceptional heart care, we have created designated Provider Care Teams.  These Care Teams include your primary Cardiologist (physician) and Advanced Practice Providers (APPs -  Physician Assistants and Nurse Practitioners) who all work together to provide you with the care you need, when you need it.  We recommend signing up for the patient portal called "MyChart".  Sign up information is provided on this After Visit Summary.  MyChart is used to connect with patients for Virtual Visits (Telemedicine).  Patients are able to view lab/test results, encounter notes, upcoming appointments, etc.  Non-urgent messages can be sent to your provider as well.   To learn more about what you can do with MyChart, go to [email or web address].    Your next appointment:   1 year(s)  The format for your next appointment:   In Person  Provider:   DR. Johney Frame    Important Information About Sugar

## 2022-04-26 NOTE — Progress Notes (Signed)
Cardiology Office Note:    Date:  04/26/2022   ID:  Anthony Skains., DOB [birth year], MRN [id number]  PCP:  Janith Lima, MD   [facility] Providers Cardiologist:  None {  Referring MD: Janith Lima, MD   History of Present Illness:    Anthony Latterell. is a 71 y.o. male with a hx of HTN, HLD, DMI and arthritis who was referred by Dr. Ronnald Ramp for further evaluation of palpitations.  Daniel was seen by Dr. Ronnald Ramp on 03/08/22. Note reviewed. He was concerned as he was getting notifications from his apple watch that he was having episodes of atrial fibrillation. Was otherwise active without anginal symptoms. He recommended a lexiscan and cardiac monitor which are currently pending.   Today, Anthony Daniel states that he feels fine and has had no symptoms. He has worn his cardiac monitor for 20 days but stopped due to tape irritation. He initially wore Anthony monitor due to 2 A-fib alerts during winter on his Apple watch. He has had no Afib notifications from his watch since. He otherwise is doing well from a CV standpoint with no chest pain, SOB, LE edema, orthopnea or PND. He continues to smoke but is trying to quit. He is also working on better glucose control.  Currently smokes 1/2 to 3/4 of a pack a day for 25 years but is attempting to quit  Fhx of CHF and A-fib in father in late 45's  Past Medical History:  Diagnosis Date   Arthritis    Back   Benign localized prostatic hyperplasia with lower urinary tract symptoms (LUTS)    CKD (chronic kidney disease), stage II    History of bladder cancer    1998--  TCC   History of kidney stones    managed by urologist--- dr Jeffie Pollock, dr prerminger '@[facility] , and dr gutierrez-aceves '@[facility]    Hyperlipidemia    Hypertension    Insulin pump in place    02-02-2021 per pt currently not using pump, doing lantus insulin bid as directed by dr Buddy Duty   LADA (latent autoimmune diabetes in adults), managed as type 1 [facility])    endocrinologist-- dr  Buddy Duty   PONV (postoperative nausea and vomiting)    Prostate cancer [facility])    urologist--- dr Jeffie Pollock--  first dx 02/ 2020 gleason 3+3, active surveillance;  bx 09-23-2020  Gleason 4+4, PSA 7.64   Spinal stenosis, cervical region    Uses self-applied continuous glucose monitoring device    DEX-COM    Past Surgical History:  Procedure Laterality Date   ANTERIOR CERVICAL DECOMP/DISCECTOMY FUSION N/A 12/15/2020   Procedure: Cervical Three-Four Cervical Four-Five Anterior cervical decompression/discectomy/fusion;  Surgeon: Erline Levine, MD;  Location: [facility];  Service: Neurosurgery;  Laterality: N/A;   COLONOSCOPY  2018   x2   CYSTOSCOPY  02/09/2021   Procedure: CYSTOSCOPY FLEXIBLE;  Surgeon: Irine Seal, MD;  Location: [facility];  Service: Urology;;   CYSTOSCOPY W/ URETERAL STENT PLACEMENT Right 05/12/2015   Procedure: CYSTOSCOPY WITH STENT REPLACEMENT;  Surgeon: Cleon Gustin, MD;  [facility];  Service: Urology;  Laterality: Right;   CYSTOSCOPY WITH RETROGRADE PYELOGRAM, URETEROSCOPY AND STENT PLACEMENT Right 01/23/2019   Procedure: CYSTOSCOPY WITH RIGHT RETROGRADE PYELOGRAM, URETEROSCOPY AND STENT PLACEMENT;  Surgeon: Irine Seal, MD;  Location: [facility];  Service: Urology;  Laterality: Right;   CYSTOSCOPY WITH URETEROSCOPY AND STENT PLACEMENT Right 04/23/2015   Procedure: CYSTOSCOPY WITH URETEROSCOPY RETROGRADE PYELOGRAM AND STENT PLACEMENT;  Surgeon: Cleon Gustin, MD;  Location: [facility];  Service: Urology;  Laterality: Right;   CYSTOSCOPY/RETROGRADE/URETEROSCOPY/STONE EXTRACTION WITH BASKET Right 05/12/2015   Procedure: CYSTOSCOPY/RETROGRADE/URETEROSCOPY/STONE EXTRACTION WITH BASKET;  Surgeon: Cleon Gustin, MD;  Location: [facility];  Service: Urology;  Laterality: Right;   CYSTOSCOPY/URETEROSCOPY/HOLMIUM LASER/STENT PLACEMENT Left 09/18/2018   Procedure: CYSTOSCOPY/ LEFT RETROGRADE LEFT URETEROSCOPY [place] LASER/STENT  PLACEMENT;  Surgeon: Irine Seal, MD;  Location[facility];  Service: Urology;  Laterality: Left;   CYSTOSCOPY/URETEROSCOPY/HOLMIUM LASER/STENT PLACEMENT Right 10/24/2019   Procedure: CYSTOSCOPY/URETEROSCOPY/HOLMIUM LASER/STENT PLACEMENT;  Surgeon: Irine Seal, MD;  [facility];  Service: Urology;  Laterality: Right;   CYSTOSCOPY/URETEROSCOPY/HOLMIUM LASER/STENT PLACEMENT Left 01/30/2020   '@[facility]    CYSTOSCOPY/URETEROSCOPY/HOLMIUM LASER/STENT PLACEMENT Left 07/13/2021   Procedure: CYSTOSCOPY/URETEROSCOPY/HOLMIUM LASER/STENT PLACEMENT;  Surgeon: Lucas Mallow, MD;  Location[facility];  Service: Urology;  Laterality: Left;   EXTRACORPOREAL SHOCK WAVE LITHOTRIPSY  x2 in  2006//   x2  in 2012   HOLMIUM LASER APPLICATION Right 41/74/0814   Procedure: HOLMIUM LASER APPLICATION;  Surgeon: Cleon Gustin, MD;  Location[facility];  Service: Urology;  Laterality: Right;   HOLMIUM LASER APPLICATION Right 48/18/5631   Procedure: HOLMIUM LASER APPLICATION;  Surgeon: Irine Seal, MD;  Location[facility];  Service: Urology;  Laterality: Right;   IR URETERAL STENT RIGHT NEW ACCESS W/O SEP NEPHROSTOMY CATH  11/25/2019   LAPAROSCOPIC APPENDECTOMY N/A 11/07/2012   Procedure: APPENDECTOMY LAPAROSCOPIC;  Surgeon: Harl Bowie, MD;  Location: [place];  Service: General;  Laterality: N/A;   NEPHROLITHOTOMY Left 08/06/2015   Procedure: 1ST STAGE  LEFT PERCUTANEOUS NEPHROLITHOTOMY ;  Surgeon: Irine Seal, MD;  Location[facility];  Service: Urology;  Laterality: Left;   NEPHROLITHOTOMY Right 11/26/2019   Procedure: NEPHROLITHOTOMY PERCUTANEOUS;  Surgeon: Irine Seal, MD;  Location[facility];  Service: Urology;  Laterality: Right;   NEPHROLITHOTOMY Right 11/28/2019   Procedure: NEPHROLITHOTOMY PERCUTANEOUS SECOND LOOK;  Surgeon: Alexis Frock, MD;  Location[facility];  Service: Urology;  Laterality: Right;   RADIOACTIVE SEED IMPLANT N/A 02/09/2021   Procedure: RADIOACTIVE SEED  IMPLANT/BRACHYTHERAPY IMPLANT;  Surgeon: Irine Seal, MD;  Location: [facility];  Service: Urology;  Laterality: N/A;   SPACE OAR INSTILLATION N/A 02/09/2021   Procedure: SPACE OAR INSTILLATION;  Surgeon: Irine Seal, MD;  Location[facility];  Service: Urology;  Laterality: N/A;   TONSILLECTOMY  1975   TRANSURETHRAL RESECTION OF BLADDER TUMOR  1998    Current Medications: Current Meds  Medication Sig   allopurinol (ZYLOPRIM) 100 MG tablet Take 1 tablet (100 mg total) by mouth daily.   aspirin EC 81 MG tablet Take 81 mg by mouth in Anthony morning.   candesartan (ATACAND) 16 MG tablet TAKE 1 TABLET BY MOUTH EVERY DAY   Ciclopirox 0.77 % gel APPLY TO AFFECTED AREA TWICE A DAY   Continuous Blood Gluc Receiver (DEXCOM G6 RECEIVER) DEVI See admin instructions.   Continuous Blood Gluc Sensor (DEXCOM G6 SENSOR) MISC change sensor   Continuous Blood Gluc Transmit (DEXCOM G6 TRANSMITTER) MISC See admin instructions.   ezetimibe (ZETIA) 10 MG tablet TAKE 1 TABLET BY MOUTH EVERY DAY   glucose blood (ONETOUCH VERIO) test strip 5X A DAY   GVOKE HYPOPEN 2-PACK 1 MG/0.2ML SOAJ INJECT 1 ACT INTO Anthony SKIN DAILY AS NEEDED (HYPOGLYCEMIA).   indapamide (LOZOL) 1.25 MG tablet Take 1 tablet (1.25 mg total) by mouth daily.   insulin aspart (NOVOLOG) 100 UNIT/ML injection  Inject into Anthony skin as directed. USE VIA INSULIN PUMP TO ADMINISTER UP TO 130 UNITS MAXIUM DAILY   Insulin Glargine (LANTUS SOLOSTAR [place]) Inject into Anthony skin 2 (two) times daily. 25 UNITS IN AM AND  20--25 UNITS;  WHEN FASTING OR UNABLE TO EAT LOWER TO 18 UNITS TWICE DAILY   Insulin Human (INSULIN PUMP) SOLN Inject 1 each into Anthony skin continuous. Novolog insulin   levocetirizine (XYZAL) 5 MG tablet TAKE 1 TABLET BY MOUTH DAILY AS NEEDED FOR ALLERGIES.   neomycin-polymyxin-hydrocortisone (CORTISPORIN) 3.5-10000-1 OTIC suspension Place 3 drops into both ears 3 (three) times daily.   nicotine (NICODERM CQ - DOSED IN  MG/24 HOURS) 14 mg/24hr patch PLACE 1 PATCH ONTO Anthony SKIN DAILY.   [START ON 06/08/2022] nicotine (NICODERM CQ) 7 mg/24hr patch Place 1 patch (7 mg total) onto Anthony skin daily.   NOVOLOG FLEXPEN 100 UNIT/ML FlexPen Inject 12-15 Units into Anthony skin 3 (three) times daily before meals. WHEN NOT USING INSULIN PUMP ;  WHEN FASTING OR UNABLE TO EAT ONLY WHEN NEEDED TO TREAT BLOOD SUGAR >200   omeprazole (PRILOSEC) 20 MG capsule Take 1 capsule (20 mg total) by mouth daily.   Polyethyl Glycol-Propyl Glycol (SYSTANE OP) Place 1 drop into both eyes 3 (three) times daily as needed (dry/irritated eyes).   Potassium Citrate 15 MEQ (1620 MG) TBCR Take 1 tablet by mouth in Anthony morning and at bedtime.   tamsulosin (FLOMAX) 0.4 MG CAPS capsule Take 0.4 mg by mouth in Anthony morning.     Allergies:   Altace [ramipril], Crestor [rosuvastatin], Lipitor [atorvastatin], Dilaudid [hydromorphone], Iodinated contrast media, Iodine, Latex, Oxycodone, Adhesive [tape], and Peanut-containing drug products   Social History   Socioeconomic History   Marital status: Married    Spouse name: Not on file   Number of children: Not on file   Years of education: Not on file   Highest education level: Master's degree (e.g., MA, MS, MEng, MEd, MSW, MBA)  Occupational History   Not on file  Tobacco Use   Smoking status: Every Day    Packs/day: 0.50    Years: 40.00    Total pack years: 20.00    Types: Cigarettes    Passive exposure: Current   Smokeless tobacco: Never  Vaping Use   Vaping Use: Never used  Substance and Sexual Activity   Alcohol use: Not Currently    Alcohol/week: 5.0 standard drinks of alcohol    Types: 5 Shots of liquor per week    Comment: occasional   Drug use: No   Sexual activity: Yes    Partners: Female  Other Topics Concern   Not on file  Social History Narrative   Not on file   Social Determinants of Health   Financial Resource Strain: Low Risk  (02/14/2022)   Overall Financial Resource  Strain (CARDIA)    Difficulty of Paying Living Expenses: Not hard at all  Food Insecurity: No Food Insecurity (03/11/2022)   Hunger Vital Sign    Worried About Running Out of Food in Anthony Last Year: Never true    Ran Out of Food in Anthony Last Year: Never true  Transportation Needs: No Transportation Needs (03/11/2022)   PRAPARE - Hydrologist (Medical): No    Lack of Transportation (Non-Medical): No  Physical Activity: Sufficiently Active (02/14/2022)   Exercise Vital Sign    Days of Exercise per Week: 5 days    Minutes of Exercise per Session: 30 min  Stress:  No Stress Concern Present (02/14/2022)   [place]    Feeling of Stress : Not at all  Social Connections: Moderately Isolated (02/14/2022)   Social Connection and Isolation Panel [NHANES]    Frequency of Communication with Friends and Family: Three times a week    Frequency of Social Gatherings with Friends and Family: Three times a week    Attends Religious Services: Never    Active Member of Clubs or Organizations: No    Attends Archivist Meetings: Never    Marital Status: Married     Family History: Anthony Daniel's family history includes Cancer in an other family member; Diabetes in an other family member; Hypertension in an other family member. There is no history of Alcohol abuse, Drug abuse, Early death, Heart disease, Hyperlipidemia, Kidney disease, or Stroke.  ROS:   Please see Anthony history of present illness.    Review of Systems  Constitutional:  Negative for chills and fever.  HENT:  Negative for nosebleeds and tinnitus.   Eyes:  Negative for blurred vision and pain.  Respiratory:  Negative for cough, hemoptysis, shortness of breath and stridor.   Cardiovascular:  Negative for chest pain, palpitations, orthopnea, claudication, leg swelling and PND.  Gastrointestinal:  Negative for blood in stool, diarrhea, nausea and  vomiting.  Genitourinary:  Negative for dysuria and hematuria.  Musculoskeletal:  Negative for falls.  Neurological:  Negative for dizziness, loss of consciousness and headaches.  Psychiatric/Behavioral:  Negative for depression, hallucinations and substance abuse. Anthony Daniel does not have insomnia.      EKGs/Labs/Other Studies Reviewed:    Anthony following studies were reviewed today: No CV studies  EKG:  EKG 03/08/22 personally reviewed NSR, poor r-wave progression  Recent Labs: 07/12/2021: ALT 17 09/07/2021: Hemoglobin 13.8; Platelets 183.0; TSH 0.82 03/08/2022: BUN 14; Creatinine, Ser 1.04; Potassium 3.8; Sodium 137  Recent Lipid Panel    Component Value Date/Time   CHOL 155 09/07/2021 1101   TRIG 93.0 09/07/2021 1101   HDL 43.70 09/07/2021 1101   CHOLHDL 4 09/07/2021 1101   VLDL 18.6 09/07/2021 1101   LDLCALC 93 09/07/2021 1101     Risk Assessment/Calculations:             Physical Exam:    VS:  BP 116/62   Pulse 91   Ht '6\' 1"'$  (1.854 m)   Wt 261 lb 6.4 oz (118.6 kg)   SpO2 97%   BMI 34.49 kg/m     Wt Readings from Last 3 Encounters:  04/26/22 261 lb 6.4 oz (118.6 kg)  03/08/22 258 lb 6.4 oz (117.2 kg)  09/16/21 252 lb (114.3 kg)     GEN: Well nourished, well developed in no acute distress HEENT: Normal NECK: No JVD; No carotid bruits CARDIAC: RRR, no murmurs, rubs, gallops, occasional ectopy RESPIRATORY:  Clear to auscultation without rales, wheezing or rhonchi  ABDOMEN: Soft, non-tender, non-distended MUSCULOSKELETAL:  No edema; No deformity  SKIN: Warm and dry NEUROLOGIC:  Alert and oriented x 3 PSYCHIATRIC:  Normal affect   ASSESSMENT:    1. Abnormal EKG   2. Medication management   3. Hyperlipidemia, unspecified hyperlipidemia type   4. Statin intolerance   5. Palpitations   6. LADA (latent autoimmune diabetes in adults), managed as type 1 ([place])    PLAN:    In order of problems listed above:  #Palpitations: Daniel with notifications  from her apple watch over Anthony winter for possible Afib. 30  day cardiac monitor currently pending. He denies any recent symptoms. Will follow-up monitor and check TTE for further work-up. -Check TTE  #Abnormal ECG: ECG with poor r-wave progression. Given run of NSVT on monitor and risk factors, Anthony Daniel is scheduled for myoview per PCP. Will check TTE as above as well. -Myoview ordered per PCP -Check TTE as above  #HTN: Controlled and at goal. -Continue candesartan '16mg'$  daily  #DMI on Insulin: -Management per PCP  #HLD: Statin intolerant. On zetia '10mg'$  daily. -Continue zetia '10mg'$  daily -Refer to Pharm D fo [id number]  #Tobacco Abuse: -Encourage cessation           Medication Adjustments/Labs and Tests Ordered: Current medicines are reviewed at length with Anthony Daniel today.  Concerns regarding medicines are outlined above.  Orders Placed This Encounter  Procedures   AMB Referral to [facility] Pharm-D   ECHOCARDIOGRAM COMPLETE   No orders of Anthony defined types were placed in this encounter.   Daniel Instructions  Medication Instructions:   Your physician recommends that you continue on your current medications as directed. Please refer to Anthony Current Medication list given to you today.  *If you need a refill on your cardiac medications before your next appointment, please call your pharmacy*   You have been referred to [place]    Testing/Procedures:  Your physician has requested that you have an echocardiogram. Echocardiography is a painless test that uses sound waves to create images of your heart. It provides your doctor with information about Anthony size and shape of your heart and how well your heart's chambers and valves are working. This procedure takes approximately one hour. There are no restrictions for this procedure.  SCHEDULING, PLEASE SCHEDULE PATIENTS MYOVIEW HIS PCP DR. Sheilah Mins   Follow-Up: At [facility], you and your health needs are our priority.  As part of our continuing mission to provide you with exceptional heart care, we have created designated Provider Care Teams.  These Care Teams include your primary Cardiologist (physician) and Advanced Practice Providers (APPs -  Physician Assistants and Nurse Practitioners) who all work together to provide you with Anthony care you need, when you need it.  We recommend signing up for Anthony Daniel portal called "MyChart".  Sign up information is provided on this After Visit Summary.  MyChart is used to connect with patients for Virtual Visits (Telemedicine).  Patients are able to view lab/test results, encounter notes, upcoming appointments, etc.  Non-urgent messages can be sent to your provider as well.   To learn more about what you can do with MyChart, go to [email or web address].    Your next appointment:   1 year(s)  Anthony format for your next appointment:   In Person  Provider:   DR. Johney Frame    Important Information About Sugar         This document serves as a record of services personally performed by Gwyndolyn Kaufman, MD. It was created on her behalf by Eugene Gavia, a trained medical scribe. Anthony creation of this record is based on Anthony scribe's personal observations and Anthony provider's statements to them. This document has been checked and approved by Anthony attending provider.  I, [name], MD, have reviewed all documentation for this visit. Anthony documentation on 04/26/22 for Anthony exam, diagnosis, procedures, and orders are all accurate and complete.    Signed, [name], MD  04/26/2022 11:23 AM    [facility]

## 2022-05-10 ENCOUNTER — Telehealth (HOSPITAL_COMMUNITY): Payer: Self-pay

## 2022-05-10 NOTE — Telephone Encounter (Signed)
Spoke with the patient, detailed instructions given. He stated that he would be here for his test. Asked to call back with any questions. S.Mckenze Slone EMTP 

## 2022-05-11 ENCOUNTER — Ambulatory Visit: Payer: Self-pay

## 2022-05-11 NOTE — Patient Instructions (Signed)
Visit Information  Thank you for taking time to visit with me today. Please don't hesitate to contact me if I can be of assistance to you.   Following are the goals we discussed today:   Goals Addressed             This Visit's Progress    COMPLETED: Maintain or improve health-currently being evaluated for cardiac health       Care Coordination Interventions: Reviewed upcoming appointments Encouraged to continue to take medications as prescribed Encouraged to contact provider for any concerning health questions Encouraged patient to contact care coordinator if needs change       f you are experiencing a Mental Health or [place] or need someone to talk to, please call the Suicide and Crisis Lifeline: 988  Patient verbalizes understanding of instructions and care plan provided today and agrees to view in [place]. Active MyChart status and patient understanding of how to access instructions and care plan via MyChart confirmed with patient.     Thea Silversmith, RN, MSN, BSN, [place] Coordinator [phone]

## 2022-05-11 NOTE — Patient Outreach (Signed)
  Care Coordination   Follow Up Visit Note   05/11/2022 Name: Anthony Daniel. MRN: [id number] DOB: 1950-12-29  Anthony Goodin. is a 71 y.o. year old male who sees Janith Lima, MD for primary care. I spoke with  Anthony Daniel. by phone today.  What matters to the patients health and wellness today?  Anthony Daniel reports he is doing well. He denies any questions or concerns. Discussed closing case. Anthony Daniel is agreeable and will contact Care Coordinator if needs change.    Goals Addressed             This Visit's Progress    COMPLETED: Maintain or improve health-currently being evaluated for cardiac health       Care Coordination Interventions: Reviewed upcoming appointments Encouraged to continue to take medications as prescribed Encouraged to contact provider for any concerning health questions Encouraged patient to contact care coordinator if needs change      SDOH assessments and interventions completed:  No  Care Coordination Interventions Activated:  No  Care Coordination Interventions:  No, not indicated   Follow up plan: No further intervention required.   Encounter Outcome:  Pt. Visit Completed   Thea Silversmith, RN, MSN, BSN, [place] Coordinator [phone]

## 2022-05-12 ENCOUNTER — Ambulatory Visit (HOSPITAL_COMMUNITY): Payer: Medicare Other | Attending: Cardiology

## 2022-05-12 ENCOUNTER — Ambulatory Visit (HOSPITAL_BASED_OUTPATIENT_CLINIC_OR_DEPARTMENT_OTHER): Payer: Medicare Other

## 2022-05-12 DIAGNOSIS — R9431 Abnormal electrocardiogram [ECG] [EKG]: Secondary | ICD-10-CM | POA: Diagnosis not present

## 2022-05-12 LAB — MYOCARDIAL PERFUSION IMAGING
LV dias vol: 102 mL (ref 62–150)
LV sys vol: 51 mL
Nuc Stress EF: 50 %
Peak HR: 100 {beats}/min
Rest HR: 67 {beats}/min
Rest Nuclear Isotope Dose: 10.2 mCi
SDS: 1
SRS: 0
SSS: 1
ST Depression (mm): 0 mm
Stress Nuclear Isotope Dose: 32.7 mCi
TID: 1.06

## 2022-05-12 LAB — ECHOCARDIOGRAM COMPLETE
Area-P 1/2: 4.39 cm2
S' Lateral: 3.1 cm

## 2022-05-12 MED ORDER — REGADENOSON 0.4 MG/5ML IV SOLN
0.4000 mg | Freq: Once | INTRAVENOUS | Status: AC
  Administered 2022-05-12: 0.4 mg via INTRAVENOUS

## 2022-05-12 MED ORDER — TECHNETIUM TC 99M TETROFOSMIN IV KIT
32.6000 | PACK | Freq: Once | INTRAVENOUS | Status: AC | PRN
  Administered 2022-05-12: 32.6 via INTRAVENOUS

## 2022-05-12 MED ORDER — TECHNETIUM TC 99M TETROFOSMIN IV KIT
10.2000 | PACK | Freq: Once | INTRAVENOUS | Status: AC | PRN
  Administered 2022-05-12: 10.2 via INTRAVENOUS

## 2022-05-12 MED ORDER — PERFLUTREN LIPID MICROSPHERE
1.0000 mL | INTRAVENOUS | Status: AC | PRN
  Administered 2022-05-12: 2 mL via INTRAVENOUS

## 2022-05-18 ENCOUNTER — Encounter: Payer: Self-pay | Admitting: Acute Care

## 2022-05-18 ENCOUNTER — Ambulatory Visit (INDEPENDENT_AMBULATORY_CARE_PROVIDER_SITE_OTHER): Payer: Medicare Other | Admitting: Acute Care

## 2022-05-18 DIAGNOSIS — F17210 Nicotine dependence, cigarettes, uncomplicated: Secondary | ICD-10-CM

## 2022-05-18 NOTE — Progress Notes (Signed)
Virtual Visit via Video Note  I connected with Anthony Daniel. on 05/18/22 at 10:30 AM EDT by a video enabled telemedicine application and verified that I am speaking with the correct person using two identifiers.  Location: Patient:  At home Provider:  [place], [place], [place], [address]    I discussed the limitations of evaluation and management by telemedicine and the availability of in person appointments. The patient expressed understanding and agreed to proceed.   Shared Decision Making Visit Lung Cancer Screening Program [phone])   Eligibility: Age 71 y.o. Pack Years Smoking History Calculation 33 pack year smoking history (# packs/per year x # years smoked) Recent History of coughing up blood  no Unexplained weight loss? no ( >Than 15 pounds within the last 6 months ) Prior History Lung / other cancer no (Diagnosis within the last 5 years already requiring surveillance chest CT Scans). Smoking Status Current Smoker Former Smokers: Years since quit:  NA  Quit Date:  NA  Visit Components: Discussion included one or more decision making aids. yes Discussion included risk/benefits of screening. yes Discussion included potential follow up diagnostic testing for abnormal scans. yes Discussion included meaning and risk of over diagnosis. yes Discussion included meaning and risk of False Positives. yes Discussion included meaning of total radiation exposure. yes  Counseling Included: Importance of adherence to annual lung cancer LDCT screening. yes Impact of comorbidities on ability to participate in the program. yes Ability and willingness to under diagnostic treatment. yes  Smoking Cessation Counseling: Current Smokers:  Discussed importance of smoking cessation. yes Information about tobacco cessation classes and interventions provided to patient. yes Patient provided with "ticket" for LDCT Scan. yes Symptomatic Patient. no  Counseling NA Diagnosis  Code: Tobacco Use Z72.0 Asymptomatic Patient yes  Counseling (Intermediate counseling: > three minutes counseling) T7017 Former Smokers:  Discussed the importance of maintaining cigarette abstinence. yes Diagnosis Code: Personal History of Nicotine Dependence. B93.903 Information about tobacco cessation classes and interventions provided to patient. Yes Patient provided with "ticket" for LDCT Scan. yes Written Order for Lung Cancer Screening with LDCT placed in Epic. Yes (CT Chest Lung Cancer Screening Low Dose W/O CM) ESP2330 Z12.2-Screening of respiratory organs Z87.891-Personal history of nicotine dependence  I have spent 25 minutes of face to face/ virtual visit   time with  Anthony Daniel discussing the risks and benefits of lung cancer screening. We viewed / discussed a power point together that explained in detail the above noted topics. We paused at intervals to allow for questions to be asked and answered to ensure understanding.We discussed that the single most powerful action that he can take to decrease his risk of developing lung cancer is to quit smoking. We discussed whether or not he is ready to commit to setting a quit date. We discussed options for tools to aid in quitting smoking including nicotine replacement therapy, non-nicotine medications, support groups, Quit Smart classes, and behavior modification. We discussed that often times setting smaller, more achievable goals, such as eliminating 1 cigarette a day for a week and then 2 cigarettes a day for a week can be helpful in slowly decreasing the number of cigarettes smoked. This allows for a sense of accomplishment as well as providing a clinical benefit. I provided  him  with smoking cessation  information  with contact information for community resources, classes, free nicotine replacement therapy, and access to mobile apps, text messaging, and on-line smoking cessation help. I have also provided  him  the office contact information  in the event he needs to contact me, or the screening staff. We discussed the time and location of the scan, and that either Doroteo Glassman RN, Joella Prince, RN  or I will call / send a letter with the results within 24-72 hours of receiving them. The patient verbalized understanding of all of  the above and had no further questions upon leaving the office. They have my contact information in the event they have any further questions.  I spent 3 minutes counseling on smoking cessation and the health risks of continued tobacco abuse.  I explained to the patient that there has been a high incidence of coronary artery disease noted on these exams. I explained that this is a non-gated exam therefore degree or severity cannot be determined. This patient is not on statin therapy. I have asked the patient to follow-up with their PCP regarding any incidental finding of coronary artery disease and management with diet or medication as their PCP  feels is clinically indicated. The patient verbalized understanding of the above and had no further questions upon completion of the visit.      Magdalen Spatz, NP 05/18/2022

## 2022-05-18 NOTE — Patient Instructions (Signed)
Thank you for participating in the [facility]. It was our pleasure to meet you today. We will call you with the results of your scan within the next few days. Your scan will be assigned a Lung RADS category score by the physicians reading the scans.  This Lung RADS score determines follow up scanning.  See below for description of categories, and follow up screening recommendations. We will be in touch to schedule your follow up screening annually or based on recommendations of our providers. We will fax a copy of your scan results to your Primary Care Physician, or the physician who referred you to the program, to ensure they have the results. Please call the office if you have any questions or concerns regarding your scanning experience or results.  Our office number is [phone]. Please speak with Denise Phelps, RN. , or  Denise Buckner RN, They are  our Lung Cancer Screening RN.'s If They are unavailable when you call, Please leave a message on the voice mail. We will return your call at our earliest convenience.This voice mail is monitored several times a day.  Remember, if your scan is normal, we will scan you annually as long as you continue to meet the criteria for the program. (Age 55-77, Current smoker or smoker who has quit within the last 15 years). If you are a smoker, remember, quitting is the single most powerful action that you can take to decrease your risk of lung cancer and other pulmonary, breathing related problems. We know quitting is hard, and we are here to help.  Please let us know if there is anything we can do to help you meet your goal of quitting. If you are a former smoker, congratulations. We are proud of you! Remain smoke free! Remember you can refer friends or family members through the number above.  We will screen them to make sure they meet criteria for the program. Thank you for helping us take better care of you by  participating in Lung Screening.  You can receive free nicotine replacement therapy ( patches, gum or mints) by calling 1-800-QUIT NOW. Please call so we can get you on the path to becoming  a non-smoker. I know it is hard, but you can do this!  Lung RADS Categories:  Lung RADS 1: no nodules or definitely non-concerning nodules.  Recommendation is for a repeat annual scan in 12 months.  Lung RADS 2:  nodules that are non-concerning in appearance and behavior with a very low likelihood of becoming an active cancer. Recommendation is for a repeat annual scan in 12 months.  Lung RADS 3: nodules that are probably non-concerning , includes nodules with a low likelihood of becoming an active cancer.  Recommendation is for a 6-month repeat screening scan. Often noted after an upper respiratory illness. We will be in touch to make sure you have no questions, and to schedule your 6-month scan.  Lung RADS 4 A: nodules with concerning findings, recommendation is most often for a follow up scan in 3 months or additional testing based on our provider's assessment of the scan. We will be in touch to make sure you have no questions and to schedule the recommended 3 month follow up scan.  Lung RADS 4 B:  indicates findings that are concerning. We will be in touch with you to schedule additional diagnostic testing based on our provider's  assessment of the scan.  Other options for assistance in smoking cessation (   As covered by your insurance benefits)  Hypnosis for smoking cessation  [employer] [phone]  Acupuncture for smoking cessation  [facility] [phone]   

## 2022-05-28 ENCOUNTER — Other Ambulatory Visit: Payer: Self-pay | Admitting: Internal Medicine

## 2022-05-28 DIAGNOSIS — B354 Tinea corporis: Secondary | ICD-10-CM

## 2022-06-02 ENCOUNTER — Ambulatory Visit (HOSPITAL_BASED_OUTPATIENT_CLINIC_OR_DEPARTMENT_OTHER)
Admission: RE | Admit: 2022-06-02 | Discharge: 2022-06-02 | Disposition: A | Discharge status: Home / Self Care | Payer: Medicare Other | Source: Ambulatory Visit | Attending: Acute Care | Admitting: Acute Care

## 2022-06-02 DIAGNOSIS — I2510 Atherosclerotic heart disease of native coronary artery without angina pectoris: Secondary | ICD-10-CM | POA: Diagnosis not present

## 2022-06-02 DIAGNOSIS — Z122 Encounter for screening for malignant neoplasm of respiratory organs: Secondary | ICD-10-CM | POA: Diagnosis not present

## 2022-06-02 DIAGNOSIS — F17210 Nicotine dependence, cigarettes, uncomplicated: Secondary | ICD-10-CM | POA: Insufficient documentation

## 2022-06-02 DIAGNOSIS — I700 Atherosclerosis of aorta: Secondary | ICD-10-CM | POA: Diagnosis not present

## 2022-06-02 DIAGNOSIS — J439 Emphysema, unspecified: Secondary | ICD-10-CM | POA: Diagnosis not present

## 2022-06-02 DIAGNOSIS — J479 Bronchiectasis, uncomplicated: Secondary | ICD-10-CM | POA: Insufficient documentation

## 2022-06-02 DIAGNOSIS — Z87891 Personal history of nicotine dependence: Secondary | ICD-10-CM

## 2022-06-06 ENCOUNTER — Other Ambulatory Visit: Payer: Self-pay | Admitting: Acute Care

## 2022-06-06 DIAGNOSIS — Z122 Encounter for screening for malignant neoplasm of respiratory organs: Secondary | ICD-10-CM

## 2022-06-06 DIAGNOSIS — Z87891 Personal history of nicotine dependence: Secondary | ICD-10-CM

## 2022-06-06 DIAGNOSIS — F17210 Nicotine dependence, cigarettes, uncomplicated: Secondary | ICD-10-CM

## 2022-06-06 NOTE — Progress Notes (Unsigned)
[name].                 DOB: 08/10/1950                    MRN: [id number]      HPI: Anthony Daniel. is a 71 y.o. male patient referred to lipid clinic by Dr.Pemberton. PMH is significant for hypertension, sleep apnea, GERD, LADA,HDL and arthritis.  chest pain, SOB, LE edema, orthopnea or PND   Patient has no acute concern today. Patient has discussed injectable lipid lowering medications at the last visit with Dr.Pemberton. His preferred option is self injection over the once every 6 months facility injected option (Leqvio). He had tried multiple statins in the past (pitavastatin 2 mg, simvastatin 5 mg , rosuvastatin, atorvastatin) they all gave severe muscle cramps especially the lower leg muscle cramps. Currently taking ezetimibe without having any problem. His diet is good due to his diabetes and frequent kidney stone problem. He sees nutritionist every 3 months. He is active around the house but does not have any exercise routine; thinking to join [facility] after thanksgiving. Reports having latex allergy - breakout in rash and itch with latex gloves. Discussed Praluent dose, appropriate administration techniques, common side effects and storage.    Current Medications: Ezetimibe 10 mg daily  Intolerances: pitavastatin 2 mg, simvastatin 5 mg , rosuvastatin, atorvastatin  Risk Factors: LADA, hypertension, 10 years ASCVD risk 41% LDL goal: <70 mg/dl (per AACE guideline)   Diet: Eats healthy balance diet, can't eat certain food due to kidney stone and diabetes. Go to nutritionist every 3 months.    Exercise: no set schedule After thanksgiving will start going to [facility]  Family History: CHF and A-fib in father in late 30's. Cancer in an other family member; Diabetes in an other family member; Hypertension in an other family member.   Social History:  Smoking:currently smokes 1/2 to 3/4 of a pack a day for 25 years but is attempting to quit  Labs: Lipid Panel      Component Value Date/Time   CHOL 155 09/07/2021 1101   TRIG 93.0 09/07/2021 1101   HDL 43.70 09/07/2021 1101   CHOLHDL 4 09/07/2021 1101   VLDL 18.6 09/07/2021 1101   LDLCALC 93 09/07/2021 1101    Past Medical History:  Diagnosis Date   Arthritis    Back   Benign localized prostatic hyperplasia with lower urinary tract symptoms (LUTS)    CKD (chronic kidney disease), stage II    History of bladder cancer    1998--  TCC   History of kidney stones    managed by urologist--- dr Jeffie Pollock, dr prerminger '@[email or web address] , and dr gutierrez-aceves '@[email or web address]    Hyperlipidemia    Hypertension    Insulin pump in place    02-02-2021 per pt currently not using pump, doing lantus insulin bid as directed by dr Buddy Duty   LADA (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 90 tablet 1   aspirin EC 81 MG tablet Take 81 mg by mouth in the morning.     candesartan (ATACAND) 16 MG tablet TAKE 1 TABLET BY MOUTH EVERY DAY 90 tablet 0   Ciclopirox 0.77 % gel APPLY TO AFFECTED AREA TWICE A DAY 30 g 2   Continuous Blood Gluc Receiver (DEXCOM G6 RECEIVER) DEVI See admin instructions.     Continuous Blood Gluc Sensor (DEXCOM G6 SENSOR) MISC change sensor     Continuous Blood Gluc Transmit (DEXCOM G6 TRANSMITTER) MISC See admin instructions.     ezetimibe (ZETIA) 10 MG tablet TAKE 1 TABLET BY MOUTH EVERY DAY 90 tablet 1   glucose blood (ONETOUCH VERIO) test strip 5X A DAY     GVOKE HYPOPEN 2-PACK 1 MG/0.2ML SOAJ INJECT 1  ACT INTO THE SKIN DAILY AS NEEDED (HYPOGLYCEMIA). 0.2 mL 3   indapamide (LOZOL) 1.25 MG tablet Take 1 tablet (1.25 mg total) by mouth daily. 90 tablet 0   insulin aspart (NOVOLOG) 100 UNIT/ML injection Inject into the skin as directed. USE VIA INSULIN PUMP TO ADMINISTER UP TO 130 UNITS MAXIUM DAILY     Insulin Glargine (LANTUS SOLOSTAR [place]) Inject into the skin 2 (two) times daily. 25 UNITS IN AM AND  20--25 UNITS;  WHEN FASTING OR UNABLE TO EAT LOWER TO 18 UNITS TWICE DAILY     Insulin Human (INSULIN PUMP) SOLN Inject 1 each into the skin continuous. Novolog insulin     levocetirizine (XYZAL) 5 MG tablet TAKE 1 TABLET BY MOUTH DAILY AS NEEDED FOR ALLERGIES. 90 tablet 1   neomycin-polymyxin-hydrocortisone (CORTISPORIN) 3.5-10000-1 OTIC suspension Place 3 drops into both ears 3 (three) times daily. 10 mL 2   nicotine (NICODERM CQ - DOSED IN MG/24 HOURS) 14 mg/24hr patch PLACE 1 PATCH ONTO THE SKIN DAILY. 28 patch 0   [START ON 06/08/2022] nicotine (NICODERM CQ) 7 mg/24hr patch Place 1 patch (7 mg total) onto the skin daily. 28 patch 0   NOVOLOG FLEXPEN 100 UNIT/ML FlexPen Inject 12-15 Units into the skin 3 (three) times daily before meals. WHEN NOT USING INSULIN PUMP ;  WHEN FASTING OR UNABLE TO EAT ONLY WHEN NEEDED TO TREAT BLOOD SUGAR >200     omeprazole (PRILOSEC) 20 MG capsule Take 1 capsule (20 mg total) by mouth daily. 90 capsule 1   Polyethyl Glycol-Propyl Glycol (SYSTANE OP) Place 1 drop into both eyes 3 (three) times daily as needed (dry/irritated eyes).     Potassium Citrate 15 MEQ (1620 MG) TBCR Take 1 tablet by mouth in the morning and at bedtime.     tamsulosin (FLOMAX) 0.4 MG CAPS capsule Take 0.4 mg by mouth in the morning.     No current facility-administered medications on file prior to visit.    Allergies  Allergen Reactions   Altace [Ramipril] Cough   Crestor [Rosuvastatin] Other (See Comments)    Muscle aches, cramps   Lipitor [Atorvastatin] Other (See Comments)    Muscle  aches, cramps   Dilaudid [Hydromorphone] Nausea And Vomiting   Iodinated Contrast Media Itching, Nausea And Vomiting and Other (See Comments)    PT TRIED THIS TWICE.EVEN WITH PRE-MEDS AND STILL HAD NAUSEA AND ITCHING--WAS TOLD HE CAN NOT TAKE iv ANYMORE   Iodine     Betadine/iodine rash per patient   Latex Other (See Comments) and Itching    Irritation   Oxycodone Nausea Only    Pt can tolerate hydrocodone in small doses only!  Adhesive [Tape] Rash   Peanut-Containing Drug Products Anxiety and Other (See Comments)    Jittery     Hyperlipidemia  Assessment:  LDLc Elevated 93 (09/07/2021) goal <70 per AACE guideline  Can not tolerate statins (pitavastatin 2 mg, simvastatin 5 mg , rosuvastatin, atorvastatin) - severe leg cramps, including trial of hydrophilic stains   Currently on ezetimibe 10 mg daily take it regularly and tolerates it well without side effects Not doing regular exercise Eats healthy diet  Patient is in agreement to start injectable lipid lowering agent to achieve goal LDLc; prefers self injection over facility administered option   Plan: Continue taking ezetimibe 10 mg daily  Will apply for PA for Praluent 75 mg; will inform patient via MyChart upon approval  Follow up lab in 2 months  Start doing regular exercise at least 30 to 45 min 5 days a week   Thank you,  Cammy Copa, Pharm.D [facility] of [facility] [address] [place], [place] [address]  Phone: [phone]; Fax: [phone]

## 2022-06-07 ENCOUNTER — Ambulatory Visit: Payer: Medicare Other | Attending: Cardiology | Admitting: Student

## 2022-06-07 ENCOUNTER — Telehealth: Payer: Self-pay | Admitting: Pharmacist

## 2022-06-07 VITALS — Wt 259.2 lb

## 2022-06-07 DIAGNOSIS — E785 Hyperlipidemia, unspecified: Secondary | ICD-10-CM | POA: Insufficient documentation

## 2022-06-07 MED ORDER — PRALUENT 75 MG/ML ~~LOC~~ SOAJ: 75.0000 mg | SUBCUTANEOUS | 11 refills | Status: DC

## 2022-06-07 NOTE — Patient Instructions (Signed)
Continue taking ezetimibe 10 mg daily  Here is Praluent information that we discussed. Praluent is a cholesterol medication that improved your body's ability to get rid of "bad cholesterol" known as LDL. It can lower your LDL up to 60%. It is an injection that is given under the skin every 2 weeks. The most common side effects of Praluent include runny nose, symptoms of the common cold, rarely flu or flu-like symptoms, back/muscle pain in about 3-4% of the patients, and redness, pain, or bruising at the injection site.  The medication often requires a prior authorization from your insurance company. We will take care of submitting all the necessary information to your insurance company to get it approved.I will submit a prior authorization for Praluent . I will call you once I hear back.Please call me at [phone] with any questions.

## 2022-06-07 NOTE — Assessment & Plan Note (Signed)
Assessment:  LDLc Elevated 93 (09/07/2021) goal <70 per AACE guideline  Can not tolerate statins (pitavastatin 2 mg, simvastatin 5 mg , rosuvastatin, atorvastatin) - severe leg cramps, including trial of hydrophilic stains   Currently on ezetimibe 10 mg daily take it regularly and tolerates it well without side effects Not doing regular exercise Eats healthy diet  Patient is in agreement to start injectable lipid lowering agent to achieve goal LDLc; prefers self injection over facility administered option   Plan: Continue taking ezetimibe 10 mg daily  Will apply for PA for Praluent 75 mg; will inform patient via MyChart upon approval  Follow up lab in 2 months  Start doing regular exercise at least 30 to 45 min 5 days a week

## 2022-06-07 NOTE — Telephone Encounter (Signed)
Applied for [employer]  Key: [id number]  Waiting on determination

## 2022-06-08 ENCOUNTER — Encounter: Payer: Self-pay | Admitting: Pharmacist

## 2022-06-08 DIAGNOSIS — E785 Hyperlipidemia, unspecified: Secondary | ICD-10-CM

## 2022-06-13 DIAGNOSIS — Z8546 Personal history of malignant neoplasm of prostate: Secondary | ICD-10-CM | POA: Diagnosis not present

## 2022-06-14 ENCOUNTER — Ambulatory Visit: Payer: BC Managed Care – PPO | Admitting: Skilled Nursing Facility1

## 2022-06-17 DIAGNOSIS — N182 Chronic kidney disease, stage 2 (mild): Secondary | ICD-10-CM | POA: Diagnosis not present

## 2022-06-17 DIAGNOSIS — E1022 Type 1 diabetes mellitus with diabetic chronic kidney disease: Secondary | ICD-10-CM | POA: Diagnosis not present

## 2022-06-17 DIAGNOSIS — E785 Hyperlipidemia, unspecified: Secondary | ICD-10-CM | POA: Diagnosis not present

## 2022-06-17 DIAGNOSIS — F17200 Nicotine dependence, unspecified, uncomplicated: Secondary | ICD-10-CM | POA: Diagnosis not present

## 2022-06-17 DIAGNOSIS — Z794 Long term (current) use of insulin: Secondary | ICD-10-CM | POA: Diagnosis not present

## 2022-06-17 DIAGNOSIS — Z720 Tobacco use: Secondary | ICD-10-CM | POA: Diagnosis not present

## 2022-06-17 DIAGNOSIS — E669 Obesity, unspecified: Secondary | ICD-10-CM | POA: Diagnosis not present

## 2022-06-17 DIAGNOSIS — Z87442 Personal history of urinary calculi: Secondary | ICD-10-CM | POA: Diagnosis not present

## 2022-06-17 DIAGNOSIS — G4762 Sleep related leg cramps: Secondary | ICD-10-CM | POA: Diagnosis not present

## 2022-06-18 ENCOUNTER — Other Ambulatory Visit: Payer: Self-pay | Admitting: Internal Medicine

## 2022-06-18 DIAGNOSIS — K219 Gastro-esophageal reflux disease without esophagitis: Secondary | ICD-10-CM

## 2022-06-20 DIAGNOSIS — Z8546 Personal history of malignant neoplasm of prostate: Secondary | ICD-10-CM | POA: Diagnosis not present

## 2022-06-20 DIAGNOSIS — Z8551 Personal history of malignant neoplasm of bladder: Secondary | ICD-10-CM | POA: Diagnosis not present

## 2022-06-20 DIAGNOSIS — N5201 Erectile dysfunction due to arterial insufficiency: Secondary | ICD-10-CM | POA: Diagnosis not present

## 2022-06-20 DIAGNOSIS — Z87442 Personal history of urinary calculi: Secondary | ICD-10-CM | POA: Diagnosis not present

## 2022-06-20 DIAGNOSIS — R310 Gross hematuria: Secondary | ICD-10-CM | POA: Diagnosis not present

## 2022-06-24 ENCOUNTER — Other Ambulatory Visit: Payer: Self-pay | Admitting: Internal Medicine

## 2022-06-24 DIAGNOSIS — I10 Essential (primary) hypertension: Secondary | ICD-10-CM

## 2022-06-24 DIAGNOSIS — E139 Other specified diabetes mellitus without complications: Secondary | ICD-10-CM

## 2022-06-29 DIAGNOSIS — R310 Gross hematuria: Secondary | ICD-10-CM | POA: Diagnosis not present

## 2022-06-29 DIAGNOSIS — Z8551 Personal history of malignant neoplasm of bladder: Secondary | ICD-10-CM | POA: Diagnosis not present

## 2022-07-07 ENCOUNTER — Other Ambulatory Visit: Payer: Self-pay | Admitting: Internal Medicine

## 2022-07-07 DIAGNOSIS — E785 Hyperlipidemia, unspecified: Secondary | ICD-10-CM

## 2022-07-15 NOTE — Telephone Encounter (Signed)
Praluent has been approved until 26/Dec/ 2024. Enrolled in the grant for co-pay assistance. Follow up lipid lab scheduled early March.  Patient informed via phone   CARD NO. [id number]   [place] Active   BIN [id number]   PCN [id number]   PC GROUP [id number]

## 2022-07-19 ENCOUNTER — Encounter: Payer: Self-pay | Admitting: Skilled Nursing Facility1

## 2022-07-19 ENCOUNTER — Encounter: Payer: Medicare Other | Attending: Urology | Admitting: Skilled Nursing Facility1

## 2022-07-19 VITALS — Ht 73.0 in | Wt 256.7 lb

## 2022-07-19 DIAGNOSIS — E109 Type 1 diabetes mellitus without complications: Secondary | ICD-10-CM | POA: Diagnosis not present

## 2022-07-19 NOTE — Progress Notes (Signed)
Medical Nutrition Therapy   Primary concerns today: Kidney stone nutrition therapy  Referral diagnosis: LADA : Type 1 Preferred learning style: Visual Learning readiness: Ready   NUTRITION ASSESSMENT   Clinical Medical Hx: LADA DM, Cholestasias, HTN, Prostate cancer Medications: Humalog (pump), Novolog (when not using pump), Dexcom G6, Lantus, Zetia, Flomax, Potassium Citrate, allopurinol, aspirin, candesartan, indapamide, omeprazole, potassium Labs: A1c 8.3 (up from 7.9 in 5/22)  Notable Signs/Symptoms: N/A  Lifestyle & Dietary Hx  Pt states he is still not wearing his pump, but plans on starting it sometime this month.  Pt states he would like to get his blood glucose down to 125-130 each time he checks.  Pt states he checks his blood glucose on his continuous monitor multiple times during the day.  Pt states he plans to make changes to get back to a "normal eating routine" since the holiday's are over.  Pt states he has been putting lemon juice in his water to help with kidney stone issues.  Pt states he is taking his blood pressure once a week. Pt states he would like to get back into swimming for physical activity. Pt states he is going to use "swim" as his word for the year to encourage him to start again.  Pt states he didn't do much physical activity over the holiday season, but still set a goal to reach 10,000 steps/day.    Estimated daily fluid intake: 64 oz Supplements: N/A Sleep: OSA Stress / self-care: Stressed about DM and kidney stones. Current average weekly physical activity: walking, goal of 10,000 steps/day   24-Hr Dietary Recall (None reported due to holiday meals) First Meal:  Snack:  Second Meal:  Snack:  Third Meal: Snack:  Beverages:   NUTRITION INTERVENTION  Nutrition education (E-1) on the following topics:  Continued: Educated patient on the role of animal based protein on uric acid levels. Educated patient on appropriate serving sizes of  protein choices. Advised patient to increase their water intake to a minimum of 64 oz each day. Educated pt on the addition of 2 ounces diluted in water of lemon juice 2 times a day to assist in kidney stone formation Educated pt on the importance of constantly eating and avoiding meal sipping for blood sugar control Educated pt on vitamin C containing foods due to research supporting a reduction in vitamin C in those that smoke Discussed with pt the importance of physical activity that is enjoyable. Encouraged pt to start swimming again on a weekly basis.     Handouts Previously Provided Include  Kidney Stone Nutrition Therapy Nutrition Care Manual High Oxalate Food List ADA: How to Thrive with Diabetes Low Purine/Purine-Restricted Nutrition Therapy Nutrition Care Manual   Learning Style & Readiness for Change Teaching method utilized: Visual & Auditory  Demonstrated degree of understanding via: Teach Back  Barriers to learning/adherence to lifestyle change: None   Goals previously Established by Pt Increase your plant based protein sources to reduce the animal based options Increase your calcium consumption from dairy products or a calcium citrate supplement if advisable by your doctor Start swimming again as a physical activity  Get back to "normal" diet now that the holiday's are over   MONITORING & EVALUATION Dietary intake, weekly physical activity.  Next Steps  Patient is to follow up with RD.

## 2022-07-28 DIAGNOSIS — N200 Calculus of kidney: Secondary | ICD-10-CM | POA: Diagnosis not present

## 2022-07-28 DIAGNOSIS — N281 Cyst of kidney, acquired: Secondary | ICD-10-CM | POA: Diagnosis not present

## 2022-07-28 DIAGNOSIS — R310 Gross hematuria: Secondary | ICD-10-CM | POA: Diagnosis not present

## 2022-07-29 ENCOUNTER — Other Ambulatory Visit: Payer: Self-pay | Admitting: Internal Medicine

## 2022-07-29 DIAGNOSIS — J301 Allergic rhinitis due to pollen: Secondary | ICD-10-CM

## 2022-07-29 DIAGNOSIS — I10 Essential (primary) hypertension: Secondary | ICD-10-CM

## 2022-08-01 DIAGNOSIS — N401 Enlarged prostate with lower urinary tract symptoms: Secondary | ICD-10-CM | POA: Diagnosis not present

## 2022-08-01 DIAGNOSIS — R82998 Other abnormal findings in urine: Secondary | ICD-10-CM | POA: Diagnosis not present

## 2022-08-01 DIAGNOSIS — R3914 Feeling of incomplete bladder emptying: Secondary | ICD-10-CM | POA: Diagnosis not present

## 2022-08-01 DIAGNOSIS — N200 Calculus of kidney: Secondary | ICD-10-CM | POA: Diagnosis not present

## 2022-08-01 DIAGNOSIS — N3041 Irradiation cystitis with hematuria: Secondary | ICD-10-CM | POA: Diagnosis not present

## 2022-08-01 DIAGNOSIS — Z8546 Personal history of malignant neoplasm of prostate: Secondary | ICD-10-CM | POA: Diagnosis not present

## 2022-08-01 DIAGNOSIS — R310 Gross hematuria: Secondary | ICD-10-CM | POA: Diagnosis not present

## 2022-08-22 ENCOUNTER — Encounter: Payer: Self-pay | Admitting: Cardiology

## 2022-08-27 ENCOUNTER — Other Ambulatory Visit: Payer: Self-pay | Admitting: Internal Medicine

## 2022-08-27 DIAGNOSIS — R82998 Other abnormal findings in urine: Secondary | ICD-10-CM

## 2022-08-27 DIAGNOSIS — M1030 Gout due to renal impairment, unspecified site: Secondary | ICD-10-CM

## 2022-09-02 ENCOUNTER — Other Ambulatory Visit: Payer: Self-pay | Admitting: Internal Medicine

## 2022-09-02 DIAGNOSIS — I10 Essential (primary) hypertension: Secondary | ICD-10-CM

## 2022-09-02 DIAGNOSIS — E139 Other specified diabetes mellitus without complications: Secondary | ICD-10-CM

## 2022-09-02 DIAGNOSIS — R8279 Other abnormal findings on microbiological examination of urine: Secondary | ICD-10-CM | POA: Diagnosis not present

## 2022-09-02 DIAGNOSIS — R350 Frequency of micturition: Secondary | ICD-10-CM | POA: Diagnosis not present

## 2022-09-02 DIAGNOSIS — R3915 Urgency of urination: Secondary | ICD-10-CM | POA: Diagnosis not present

## 2022-09-18 ENCOUNTER — Encounter: Payer: Self-pay | Admitting: Internal Medicine

## 2022-09-19 ENCOUNTER — Encounter: Payer: Self-pay | Admitting: Cardiology

## 2022-09-20 DIAGNOSIS — E1022 Type 1 diabetes mellitus with diabetic chronic kidney disease: Secondary | ICD-10-CM | POA: Diagnosis not present

## 2022-09-20 DIAGNOSIS — Z125 Encounter for screening for malignant neoplasm of prostate: Secondary | ICD-10-CM | POA: Diagnosis not present

## 2022-09-20 DIAGNOSIS — R748 Abnormal levels of other serum enzymes: Secondary | ICD-10-CM | POA: Diagnosis not present

## 2022-09-20 DIAGNOSIS — R350 Frequency of micturition: Secondary | ICD-10-CM | POA: Diagnosis not present

## 2022-09-20 DIAGNOSIS — D509 Iron deficiency anemia, unspecified: Secondary | ICD-10-CM | POA: Diagnosis not present

## 2022-09-20 DIAGNOSIS — E785 Hyperlipidemia, unspecified: Secondary | ICD-10-CM | POA: Diagnosis not present

## 2022-09-20 DIAGNOSIS — R32 Unspecified urinary incontinence: Secondary | ICD-10-CM | POA: Diagnosis not present

## 2022-09-20 LAB — BASIC METABOLIC PANEL
BUN: 15 (ref 4–21)
CO2: 25 — AB (ref 13–22)
Chloride: 100 (ref 99–108)
Creatinine: 1.1 (ref 0.6–1.3)
Glucose: 192
Potassium: 4.6 mEq/L (ref 3.5–5.1)
Sodium: 136 — AB (ref 137–147)

## 2022-09-20 LAB — COMPREHENSIVE METABOLIC PANEL
Albumin: 3.6 (ref 3.5–5.0)
Calcium: 8.9 (ref 8.7–10.7)

## 2022-09-20 LAB — HEPATIC FUNCTION PANEL
ALT: 12 U/L (ref 10–40)
AST: 11 — AB (ref 14–40)
Alkaline Phosphatase: 135 — AB (ref 25–125)
Bilirubin, Total: 0.5

## 2022-09-20 LAB — CBC AND DIFFERENTIAL
HCT: 36 — AB (ref 41–53)
Hemoglobin: 11.5 — AB (ref 13.5–17.5)
Platelets: 276 10*3/uL (ref 150–400)
WBC: 15

## 2022-09-20 LAB — HEMOGLOBIN A1C: Hemoglobin A1C: 8.2

## 2022-09-20 LAB — TSH: TSH: 0.7 (ref 0.41–5.90)

## 2022-09-20 LAB — PSA: PSA: 0.2

## 2022-09-21 ENCOUNTER — Ambulatory Visit: Payer: Medicare Other | Attending: Cardiology

## 2022-09-21 DIAGNOSIS — E785 Hyperlipidemia, unspecified: Secondary | ICD-10-CM

## 2022-09-22 LAB — LIPID PANEL
Chol/HDL Ratio: 3.4 ratio (ref 0.0–5.0)
Cholesterol, Total: 125 mg/dL (ref 100–199)
HDL: 37 mg/dL — ABNORMAL LOW (ref >39)
LDL Chol Calc (NIH): 71 mg/dL (ref 0–99)
Triglycerides: 84 mg/dL (ref 0–149)
VLDL Cholesterol Cal: 17 mg/dL (ref 5–40)

## 2022-09-23 ENCOUNTER — Encounter: Payer: Self-pay | Admitting: Internal Medicine

## 2022-09-23 DIAGNOSIS — N182 Chronic kidney disease, stage 2 (mild): Secondary | ICD-10-CM | POA: Diagnosis not present

## 2022-09-23 DIAGNOSIS — E1022 Type 1 diabetes mellitus with diabetic chronic kidney disease: Secondary | ICD-10-CM | POA: Diagnosis not present

## 2022-09-23 DIAGNOSIS — Z87898 Personal history of other specified conditions: Secondary | ICD-10-CM | POA: Diagnosis not present

## 2022-09-23 DIAGNOSIS — Z720 Tobacco use: Secondary | ICD-10-CM | POA: Diagnosis not present

## 2022-09-23 DIAGNOSIS — Z794 Long term (current) use of insulin: Secondary | ICD-10-CM | POA: Diagnosis not present

## 2022-09-23 DIAGNOSIS — R8290 Unspecified abnormal findings in urine: Secondary | ICD-10-CM | POA: Diagnosis not present

## 2022-09-23 DIAGNOSIS — E669 Obesity, unspecified: Secondary | ICD-10-CM | POA: Diagnosis not present

## 2022-09-23 DIAGNOSIS — D72829 Elevated white blood cell count, unspecified: Secondary | ICD-10-CM | POA: Diagnosis not present

## 2022-09-23 DIAGNOSIS — D649 Anemia, unspecified: Secondary | ICD-10-CM | POA: Diagnosis not present

## 2022-09-23 DIAGNOSIS — E785 Hyperlipidemia, unspecified: Secondary | ICD-10-CM | POA: Diagnosis not present

## 2022-09-23 DIAGNOSIS — Z87442 Personal history of urinary calculi: Secondary | ICD-10-CM | POA: Diagnosis not present

## 2022-09-23 DIAGNOSIS — R748 Abnormal levels of other serum enzymes: Secondary | ICD-10-CM | POA: Diagnosis not present

## 2022-09-27 ENCOUNTER — Ambulatory Visit (INDEPENDENT_AMBULATORY_CARE_PROVIDER_SITE_OTHER): Payer: Medicare Other | Admitting: Internal Medicine

## 2022-09-27 ENCOUNTER — Encounter: Payer: Self-pay | Admitting: Internal Medicine

## 2022-09-27 VITALS — BP 136/78 | HR 75 | Temp 98.5°F | Resp 16 | Ht 73.0 in | Wt 250.0 lb

## 2022-09-27 DIAGNOSIS — R300 Dysuria: Secondary | ICD-10-CM

## 2022-09-27 DIAGNOSIS — D508 Other iron deficiency anemias: Secondary | ICD-10-CM

## 2022-09-27 DIAGNOSIS — N410 Acute prostatitis: Secondary | ICD-10-CM

## 2022-09-27 DIAGNOSIS — D539 Nutritional anemia, unspecified: Secondary | ICD-10-CM | POA: Diagnosis not present

## 2022-09-27 DIAGNOSIS — E139 Other specified diabetes mellitus without complications: Secondary | ICD-10-CM | POA: Diagnosis not present

## 2022-09-27 LAB — URINALYSIS, ROUTINE W REFLEX MICROSCOPIC
Bilirubin Urine: NEGATIVE
Ketones, ur: NEGATIVE
Nitrite: NEGATIVE
Specific Gravity, Urine: >=1.03 — AB (ref 1.000–1.030)
Total Protein, Urine: 30 — AB
Urine Glucose: NEGATIVE
Urobilinogen, UA: 0.2 (ref 0.0–1.0)
pH: 6 (ref 5.0–8.0)

## 2022-09-27 LAB — IBC + FERRITIN
Ferritin: 106.7 ng/mL (ref 22.0–322.0)
Iron: 28 ug/dL — ABNORMAL LOW (ref 42–165)
Saturation Ratios: 10.6 % — ABNORMAL LOW (ref 20.0–50.0)
TIBC: 264.6 ug/dL (ref 250.0–450.0)
Transferrin: 189 mg/dL — ABNORMAL LOW (ref 212.0–360.0)

## 2022-09-27 LAB — CBC WITH DIFFERENTIAL/PLATELET
Basophils Absolute: 0.1 10*3/uL (ref 0.0–0.1)
Basophils Relative: 0.6 % (ref 0.0–3.0)
Eosinophils Absolute: 0.4 10*3/uL (ref 0.0–0.7)
Eosinophils Relative: 2.8 % (ref 0.0–5.0)
HCT: 37.4 % — ABNORMAL LOW (ref 39.0–52.0)
Hemoglobin: 12.2 g/dL — ABNORMAL LOW (ref 13.0–17.0)
Lymphocytes Relative: 17.1 % (ref 12.0–46.0)
Lymphs Abs: 2.4 10*3/uL (ref 0.7–4.0)
MCHC: 32.5 g/dL (ref 30.0–36.0)
MCV: 85.6 fl (ref 78.0–100.0)
Monocytes Absolute: 0.9 10*3/uL (ref 0.1–1.0)
Monocytes Relative: 6.2 % (ref 3.0–12.0)
Neutro Abs: 10.1 10*3/uL — ABNORMAL HIGH (ref 1.4–7.7)
Neutrophils Relative %: 73.3 % (ref 43.0–77.0)
Platelets: 271 10*3/uL (ref 150.0–400.0)
RBC: 4.37 Mil/uL (ref 4.22–5.81)
RDW: 15.7 % — ABNORMAL HIGH (ref 11.5–15.5)
WBC: 13.7 10*3/uL — ABNORMAL HIGH (ref 4.0–10.5)

## 2022-09-27 LAB — VITAMIN B12: Vitamin B-12: 260 pg/mL (ref 211–911)

## 2022-09-27 LAB — FOLATE: Folate: 9.6 ng/mL (ref >5.9)

## 2022-09-27 MED ORDER — ACCRUFER 30 MG PO CAPS: 1.0000 | ORAL_CAPSULE | Freq: Two times a day (BID) | ORAL | 0 refills | Status: DC

## 2022-09-27 MED ORDER — CIPROFLOXACIN HCL 500 MG PO TABS: 500.0000 mg | ORAL_TABLET | Freq: Two times a day (BID) | ORAL | 0 refills | Status: AC

## 2022-09-27 NOTE — Patient Instructions (Signed)

## 2022-09-27 NOTE — Progress Notes (Signed)
Subjective:  [name]., male    DOB: 08-27-1950  Age: 72 y.o. MRN: [id number]  CC: Anemia, Hypertension, and Diabetes   HPI Anthony Daniel. presents for f/up ---  He complains of a several week history of gross hematuria, dysuria, urgency, and pelvic pain.  He has had labs done elsewhere and has been told that he is anemic.   Outpatient Medications Prior to Visit  Medication Sig Dispense Refill   allopurinol (ZYLOPRIM) 100 MG tablet TAKE 1 TABLET BY MOUTH EVERY DAY 90 tablet 1   aspirin EC 81 MG tablet Take 81 mg by mouth in the morning.     candesartan (ATACAND) 16 MG tablet TAKE 1 TABLET BY MOUTH EVERY DAY 90 tablet 0   Ciclopirox 0.77 % gel APPLY TO AFFECTED AREA TWICE A DAY 30 g 2   Continuous Blood Gluc Receiver (DEXCOM G6 RECEIVER) DEVI See admin instructions.     Continuous Blood Gluc Sensor (DEXCOM G6 SENSOR) MISC change sensor     Continuous Blood Gluc Transmit (DEXCOM G6 TRANSMITTER) MISC See admin instructions.     ezetimibe (ZETIA) 10 MG tablet TAKE 1 TABLET BY MOUTH EVERY DAY 90 tablet 1   glucose blood (ONETOUCH VERIO) test strip 5X A DAY     indapamide (LOZOL) 1.25 MG tablet TAKE 1 TABLET BY MOUTH EVERY DAY 90 tablet 0   insulin aspart (NOVOLOG) 100 UNIT/ML injection Inject into the skin as directed. USE VIA INSULIN PUMP TO ADMINISTER UP TO 130 UNITS MAXIUM DAILY     Insulin Glargine (LANTUS SOLOSTAR [place]) Inject into the skin 2 (two) times daily. 25 UNITS IN AM AND  20--25 UNITS;  WHEN FASTING OR UNABLE TO EAT LOWER TO 18 UNITS TWICE DAILY     Insulin Human (INSULIN PUMP) SOLN Inject 1 each into the skin continuous. Novolog insulin     levocetirizine (XYZAL) 5 MG tablet TAKE 1 TABLET BY MOUTH DAILY AS NEEDED FOR ALLERGIES. 90 tablet 1   mesalamine (CANASA) 1000 MG suppository Place 1,000 mg rectally at bedtime.     metaxalone (SKELAXIN) 400 MG tablet Take 400 mg by mouth as needed.     NOVOLOG FLEXPEN 100 UNIT/ML FlexPen Inject 12-15 Units into  the skin 3 (three) times daily before meals. WHEN NOT USING INSULIN PUMP ;  WHEN FASTING OR UNABLE TO EAT ONLY WHEN NEEDED TO TREAT BLOOD SUGAR >200     nystatin (MYCOSTATIN/NYSTOP) powder Apply 1 Application topically.     omeprazole (PRILOSEC) 20 MG capsule TAKE 1 CAPSULE BY MOUTH EVERY DAY 90 capsule 1   Polyethyl Glycol-Propyl Glycol (SYSTANE OP) Place 1 drop into both eyes 3 (three) times daily as needed (dry/irritated eyes).     potassium chloride SA (KLOR-CON M20) 20 MEQ tablet Take 20 mEq by mouth 3 (three) times daily.     Potassium Citrate 15 MEQ (1620 MG) TBCR Take 1 tablet by mouth in the morning and at bedtime.     predniSONE (DELTASONE) 50 MG tablet Take 50 mg by mouth.     Propylene Glycol (SYSTANE COMPLETE) 0.6 % SOLN Apply to eye.     tamsulosin (FLOMAX) 0.4 MG CAPS capsule Take 0.4 mg by mouth in the morning.     TRESIBA FLEXTOUCH 100 UNIT/ML FlexTouch Pen Inject into the skin.     Alirocumab (PRALUENT) 75 MG/ML SOAJ Inject 75 mg into the skin every 14 (fourteen) days. (Patient not taking: Reported on 09/27/2022) 2 mL 11   GVOKE  HYPOPEN 2-PACK 1 MG/0.2ML SOAJ INJECT 1 ACT INTO THE SKIN DAILY AS NEEDED (HYPOGLYCEMIA). 0.2 mL 3   neomycin-polymyxin-hydrocortisone (CORTISPORIN) 3.5-10000-1 OTIC suspension Place 3 drops into both ears 3 (three) times daily. (Patient not taking: Reported on 09/27/2022) 10 mL 2   nicotine (NICODERM CQ - DOSED IN MG/24 HOURS) 14 mg/24hr patch PLACE 1 PATCH ONTO THE SKIN DAILY. (Patient not taking: Reported on 09/27/2022) 28 patch 0   No facility-administered medications prior to visit.    ROS Review of Systems  Constitutional: Negative.  Negative for chills, diaphoresis, fatigue and fever.  HENT: Negative.    Respiratory:  Negative for cough, chest tightness, shortness of breath and wheezing.   Cardiovascular: Negative.  Negative for chest pain, palpitations and leg swelling.  Gastrointestinal:  Negative for abdominal pain, constipation, diarrhea and  nausea.  Genitourinary:  Positive for dysuria and hematuria. Negative for difficulty urinating, flank pain, frequency, testicular pain and urgency.       Cloudy urine  Musculoskeletal: Negative.   Skin: Negative.   Neurological: Negative.  Negative for dizziness, weakness and headaches.  Hematological:  Negative for adenopathy. Does not bruise/bleed easily.  Psychiatric/Behavioral: Negative.      Objective:  BP 136/78 (BP Location: Left Arm, Patient Position: Sitting, Cuff Size: Normal)   Pulse 75   Temp 98.5 F (36.9 C) (Oral)   Resp 16   Ht 6\' 1"  (1.854 m)   Wt 250 lb (113.4 kg)   SpO2 96%   BMI 32.98 kg/m   BP Readings from Last 3 Encounters:  09/27/22 136/78  04/26/22 116/62  03/08/22 (!) 142/86    Wt Readings from Last 3 Encounters:  09/27/22 250 lb (113.4 kg)  07/19/22 256 lb 11.2 oz (116.4 kg)  06/07/22 259 lb 3.2 oz (117.6 kg)    Physical Exam Vitals reviewed. Exam conducted with a chaperone present.  Constitutional:      General: He is not in acute distress.    Appearance: He is not ill-appearing, toxic-appearing or diaphoretic.  HENT:     Nose: Nose normal.     Mouth/Throat:     Mouth: Mucous membranes are moist.  Eyes:     General: No scleral icterus.    Conjunctiva/sclera: Conjunctivae normal.  Cardiovascular:     Rate and Rhythm: Normal rate and regular rhythm.     Heart sounds: No murmur heard. Pulmonary:     Effort: Pulmonary effort is normal.     Breath sounds: No stridor. No wheezing, rhonchi or rales.  Abdominal:     General: Abdomen is flat.     Palpations: There is no mass.     Tenderness: There is no abdominal tenderness. There is no guarding.     Hernia: No hernia is present. There is no hernia in the left inguinal area or right inguinal area.  Genitourinary:    Pubic Area: No rash.      Penis: Normal.      Testes: Normal.     Epididymis:     Right: Normal.     Left: Normal.     Prostate: Tender. Not enlarged and no nodules  present.     Rectum: Guaiac result negative. External hemorrhoid and internal hemorrhoid present. No mass, tenderness or anal fissure. Normal anal tone.  Musculoskeletal:        General: Normal range of motion.     Cervical back: Neck supple.     Right lower leg: No edema.     Left lower leg: No  edema.  Lymphadenopathy:     Cervical: No cervical adenopathy.     Lower Body: No right inguinal adenopathy. No left inguinal adenopathy.  Skin:    General: Skin is warm and dry.     Coloration: Skin is pale.  Neurological:     General: No focal deficit present.     Mental Status: He is alert. Mental status is at baseline.  Psychiatric:        Mood and Affect: Mood normal.        Behavior: Behavior normal.     Lab Results  Component Value Date   WBC 13.7 (H) 09/27/2022   HGB 12.2 (L) 09/27/2022   HCT 37.4 (L) 09/27/2022   PLT 271.0 09/27/2022   GLUCOSE 150 (H) 03/08/2022   CHOL 125 09/21/2022   TRIG 84 09/21/2022   HDL 37 (L) 09/21/2022   LDLCALC 71 09/21/2022   ALT 12 09/20/2022   AST 11 (A) 09/20/2022   NA 136 (A) 09/20/2022   K 4.6 09/20/2022   CL 100 09/20/2022   CREATININE 1.1 09/20/2022   BUN 15 09/20/2022   CO2 25 (A) 09/20/2022   TSH 0.70 09/20/2022   PSA 0.2 09/20/2022   INR 0.9 02/05/2021   HGBA1C 8.2 09/20/2022   MICROALBUR 1.4 09/07/2021    CT CHEST LUNG CA SCREEN LOW DOSE W/O CM  Result Date: 06/03/2022 CLINICAL DATA:  Current smoker, 33 pack-year history. EXAM: CT CHEST WITHOUT CONTRAST LOW-DOSE FOR LUNG CANCER SCREENING TECHNIQUE: Multidetector CT imaging of the chest was performed following the standard protocol without IV contrast. RADIATION DOSE REDUCTION: This exam was performed according to the departmental dose-optimization program which includes automated exposure control, adjustment of the mA and/or kV according to patient size and/or use of iterative reconstruction technique. COMPARISON:  None Available. FINDINGS: Cardiovascular: Atherosclerotic  calcification of the aorta and coronary arteries. Heart size normal. Small amount of pericardial fluid is likely physiologic. Mediastinum/Nodes: No pathologically enlarged mediastinal or axillary lymph nodes. Hilar regions are difficult to definitively evaluate without IV contrast. Esophagus is grossly unremarkable. Lungs/Pleura: Centrilobular and paraseptal emphysema. 2.7 mm left upper lobe nodule. No suspicious pulmonary nodules. No pleural fluid. Mild cylindrical bronchiectasis. Airway is otherwise unremarkable. Upper Abdomen: Visualized portions of the liver, gallbladder, adrenal glands and right kidney are unremarkable. 1.5 cm low-attenuation lesion in the upper pole left kidney. No specific follow-up necessary. Visualized portions of the spleen, pancreas, stomach and bowel are grossly unremarkable. No upper abdominal adenopathy. Musculoskeletal: No worrisome lytic or sclerotic lesions. IMPRESSION: 1. Lung-RADS 2, benign appearance or behavior. Continue annual screening with low-dose chest CT without contrast in 12 months. 2. Mild cylindrical bronchiectasis. 3. Aortic atherosclerosis (ICD10-I70.0). Coronary artery calcification. 4.  Emphysema (ICD10-J43.9). Electronically Signed   By: Lorin Picket M.D.   On: 06/03/2022 14:18   Assessment & Plan:   Elek was seen today for anemia, hypertension and diabetes.  Diagnoses and all orders for this visit:  Dysuria -     Urinalysis, Routine w reflex microscopic; Future -     CULTURE, URINE COMPREHENSIVE; Future -     CULTURE, URINE COMPREHENSIVE -     Urinalysis, Routine w reflex microscopic  Deficiency anemia- Will evaluate for vitamin deficiencies. -     IBC + Ferritin; Future -     Reticulocytes; Future -     Vitamin B1; Future -     Zinc; Future -     CBC with Differential/Platelet; Future -     Vitamin B12; Future -  Folate; Future -     Folate -     Vitamin B12 -     CBC with Differential/Platelet -     Zinc -     Vitamin B1 -      Reticulocytes -     IBC + Ferritin  LADA (latent autoimmune diabetes in adults), managed as type 1 ([place]) -     Ambulatory referral to Ophthalmology  Acute prostatitis with hematuria- Will treat with a fluoroquinolone. -     ciprofloxacin (CIPRO) 500 MG tablet; Take 1 tablet (500 mg total) by mouth 2 (two) times daily for 14 days.  Iron deficiency anemia secondary to inadequate dietary iron intake -     Ferric Maltol (ACCRUFER) 30 MG CAPS; Take 1 capsule (30 mg total) by mouth in the morning and at bedtime.   I am having Julus T. Jesus Genera. "Tommy" start on ciprofloxacin and ACCRUFeR. I am also having him maintain his Insulin Glargine (LANTUS SOLOSTAR [place]), aspirin EC, Polyethyl Glycol-Propyl Glycol (SYSTANE OP), insulin pump, OneTouch Verio, Dexcom G6 Receiver, Dexcom G6 Sensor, Dexcom G6 Transmitter, NovoLOG FlexPen, tamsulosin, Potassium Citrate, insulin aspart, neomycin-polymyxin-hydrocortisone, [place] HypoPen 2-Pack, nicotine, Ciclopirox, Praluent, omeprazole, ezetimibe, levocetirizine, allopurinol, indapamide, candesartan, T[place]iba FlexTouch, mesalamine, metaxalone, nystatin, potassium chloride SA, predniSONE, and Systane Complete.  Meds ordered this encounter  Medications   ciprofloxacin (CIPRO) 500 MG tablet    Sig: Take 1 tablet (500 mg total) by mouth 2 (two) times daily for 14 days.    Dispense:  28 tablet    Refill:  0   Ferric Maltol (ACCRUFER) 30 MG CAPS    Sig: Take 1 capsule (30 mg total) by mouth in the morning and at bedtime.    Dispense:  180 capsule    Refill:  0     Follow-up: Return in about 3 months (around 12/28/2022).  Scarlette Calico, MD

## 2022-09-29 ENCOUNTER — Encounter: Payer: Self-pay | Admitting: Internal Medicine

## 2022-09-30 DIAGNOSIS — N3041 Irradiation cystitis with hematuria: Secondary | ICD-10-CM | POA: Diagnosis not present

## 2022-09-30 DIAGNOSIS — C61 Malignant neoplasm of prostate: Secondary | ICD-10-CM | POA: Diagnosis not present

## 2022-09-30 DIAGNOSIS — R350 Frequency of micturition: Secondary | ICD-10-CM | POA: Diagnosis not present

## 2022-09-30 DIAGNOSIS — N401 Enlarged prostate with lower urinary tract symptoms: Secondary | ICD-10-CM | POA: Diagnosis not present

## 2022-10-10 LAB — CULTURE, URINE COMPREHENSIVE: RESULT:: NO GROWTH

## 2022-10-10 LAB — ZINC: Zinc: 80 ug/dL (ref 60–130)

## 2022-10-10 LAB — VITAMIN B1: Vitamin B1 (Thiamine): 11 nmol/L (ref 8–30)

## 2022-10-10 LAB — RETICULOCYTES
ABS Retic: 65250 cells/uL (ref 25000–90000)
Retic Ct Pct: 1.5 %

## 2022-10-13 ENCOUNTER — Other Ambulatory Visit: Payer: Self-pay | Admitting: Internal Medicine

## 2022-10-13 DIAGNOSIS — Z713 Dietary counseling and surveillance: Secondary | ICD-10-CM | POA: Diagnosis not present

## 2022-10-13 DIAGNOSIS — Z6834 Body mass index (BMI) 34.0-34.9, adult: Secondary | ICD-10-CM | POA: Diagnosis not present

## 2022-10-13 DIAGNOSIS — E6609 Other obesity due to excess calories: Secondary | ICD-10-CM | POA: Diagnosis not present

## 2022-10-13 DIAGNOSIS — R748 Abnormal levels of other serum enzymes: Secondary | ICD-10-CM

## 2022-10-13 DIAGNOSIS — E119 Type 2 diabetes mellitus without complications: Secondary | ICD-10-CM | POA: Diagnosis not present

## 2022-10-13 DIAGNOSIS — N289 Disorder of kidney and ureter, unspecified: Secondary | ICD-10-CM | POA: Diagnosis not present

## 2022-10-19 ENCOUNTER — Other Ambulatory Visit (HOSPITAL_COMMUNITY): Payer: Self-pay

## 2022-10-24 DIAGNOSIS — N200 Calculus of kidney: Secondary | ICD-10-CM | POA: Diagnosis not present

## 2022-10-27 DIAGNOSIS — Z86010 Personal history of colonic polyps: Secondary | ICD-10-CM | POA: Diagnosis not present

## 2022-10-27 DIAGNOSIS — K6289 Other specified diseases of anus and rectum: Secondary | ICD-10-CM | POA: Diagnosis not present

## 2022-10-27 DIAGNOSIS — K5900 Constipation, unspecified: Secondary | ICD-10-CM | POA: Diagnosis not present

## 2022-11-02 DIAGNOSIS — Z8546 Personal history of malignant neoplasm of prostate: Secondary | ICD-10-CM | POA: Diagnosis not present

## 2022-11-03 DIAGNOSIS — E113392 Type 2 diabetes mellitus with moderate nonproliferative diabetic retinopathy without macular edema, left eye: Secondary | ICD-10-CM | POA: Diagnosis not present

## 2022-11-03 DIAGNOSIS — H43392 Other vitreous opacities, left eye: Secondary | ICD-10-CM | POA: Diagnosis not present

## 2022-11-03 DIAGNOSIS — E113311 Type 2 diabetes mellitus with moderate nonproliferative diabetic retinopathy with macular edema, right eye: Secondary | ICD-10-CM | POA: Diagnosis not present

## 2022-11-03 DIAGNOSIS — H25813 Combined forms of age-related cataract, bilateral: Secondary | ICD-10-CM | POA: Diagnosis not present

## 2022-11-04 DIAGNOSIS — D72829 Elevated white blood cell count, unspecified: Secondary | ICD-10-CM | POA: Diagnosis not present

## 2022-11-04 DIAGNOSIS — Z794 Long term (current) use of insulin: Secondary | ICD-10-CM | POA: Diagnosis not present

## 2022-11-04 DIAGNOSIS — Z87442 Personal history of urinary calculi: Secondary | ICD-10-CM | POA: Diagnosis not present

## 2022-11-04 DIAGNOSIS — D649 Anemia, unspecified: Secondary | ICD-10-CM | POA: Diagnosis not present

## 2022-11-04 DIAGNOSIS — N182 Chronic kidney disease, stage 2 (mild): Secondary | ICD-10-CM | POA: Diagnosis not present

## 2022-11-04 DIAGNOSIS — R748 Abnormal levels of other serum enzymes: Secondary | ICD-10-CM | POA: Diagnosis not present

## 2022-11-04 DIAGNOSIS — E1022 Type 1 diabetes mellitus with diabetic chronic kidney disease: Secondary | ICD-10-CM | POA: Diagnosis not present

## 2022-11-04 DIAGNOSIS — E785 Hyperlipidemia, unspecified: Secondary | ICD-10-CM | POA: Diagnosis not present

## 2022-11-04 DIAGNOSIS — E669 Obesity, unspecified: Secondary | ICD-10-CM | POA: Diagnosis not present

## 2022-11-07 ENCOUNTER — Ambulatory Visit
Admission: RE | Admit: 2022-11-07 | Discharge: 2022-11-07 | Disposition: A | Discharge status: Home / Self Care | Payer: Medicare Other | Source: Ambulatory Visit | Attending: Internal Medicine | Admitting: Internal Medicine

## 2022-11-07 DIAGNOSIS — N281 Cyst of kidney, acquired: Secondary | ICD-10-CM | POA: Diagnosis not present

## 2022-11-07 DIAGNOSIS — R748 Abnormal levels of other serum enzymes: Secondary | ICD-10-CM

## 2022-11-09 DIAGNOSIS — N200 Calculus of kidney: Secondary | ICD-10-CM | POA: Diagnosis not present

## 2022-11-09 DIAGNOSIS — Z8546 Personal history of malignant neoplasm of prostate: Secondary | ICD-10-CM | POA: Diagnosis not present

## 2022-11-09 DIAGNOSIS — Z8551 Personal history of malignant neoplasm of bladder: Secondary | ICD-10-CM | POA: Diagnosis not present

## 2022-11-09 DIAGNOSIS — R8271 Bacteriuria: Secondary | ICD-10-CM | POA: Diagnosis not present

## 2022-11-09 DIAGNOSIS — N3041 Irradiation cystitis with hematuria: Secondary | ICD-10-CM | POA: Diagnosis not present

## 2022-11-18 DIAGNOSIS — R82991 Hypocitraturia: Secondary | ICD-10-CM | POA: Diagnosis not present

## 2022-11-18 DIAGNOSIS — N200 Calculus of kidney: Secondary | ICD-10-CM | POA: Diagnosis not present

## 2022-11-18 DIAGNOSIS — M1030 Gout due to renal impairment, unspecified site: Secondary | ICD-10-CM | POA: Diagnosis not present

## 2022-11-18 DIAGNOSIS — N289 Disorder of kidney and ureter, unspecified: Secondary | ICD-10-CM | POA: Diagnosis not present

## 2022-11-20 ENCOUNTER — Other Ambulatory Visit: Payer: Self-pay | Admitting: Internal Medicine

## 2022-11-20 ENCOUNTER — Ambulatory Visit
Admission: EM | Admit: 2022-11-20 | Discharge: 2022-11-20 | Disposition: A | Discharge status: Home / Self Care | Payer: Medicare Other

## 2022-11-20 DIAGNOSIS — H5789 Other specified disorders of eye and adnexa: Secondary | ICD-10-CM | POA: Diagnosis not present

## 2022-11-20 DIAGNOSIS — I10 Essential (primary) hypertension: Secondary | ICD-10-CM

## 2022-11-20 DIAGNOSIS — E139 Other specified diabetes mellitus without complications: Secondary | ICD-10-CM

## 2022-11-20 NOTE — Discharge Instructions (Signed)
Go to [facility] ophthalmology tomorrow at 8:30 AM to be evaluated.

## 2022-11-20 NOTE — ED Provider Notes (Addendum)
[facility]    CSN: [id number] Arrival date & time: 11/20/22  1307      History   Chief Complaint Chief Complaint  Patient presents with   Eye Problem    HPI Anthony Daniel. is a 72 y.o. male.   Patient presents with left eye redness and irritation that started about 2 days ago.  Patient denies trauma or foreign body to the eye.  Denies blurry vision.  Patient reports that it feels irritated.  Denies itchiness.  Denies any drainage from the eye.  Reports that he does have type 1 diabetes and recently had an eye evaluation approximately 3 weeks ago by Dr. Allyne Gee.  Denies any associated nasal congestion or runny nose. Denies fever.  Reports that he wears "readers" but does not wear glasses all the time and does not wear contacts.   Eye Problem   Past Medical History:  Diagnosis Date   Arthritis    Back   Benign localized prostatic hyperplasia with lower urinary tract symptoms (LUTS)    CKD (chronic kidney disease), stage II    History of bladder cancer    1998--  TCC   History of kidney stones    managed by urologist--- dr Annabell Howells, dr prerminger @[facility] , and dr [email or web address]-aceves @[facility]    Hyperlipidemia    Hypertension    Insulin pump in place    02-02-2021 per pt currently not using pump, doing lantus insulin bid as directed by dr Sharl Ma   LADA (latent autoimmune diabetes in adults), managed as type 1 [facility])    endocrinologist-- dr Sharl Ma   PONV (postoperative nausea and vomiting)    Prostate cancer [facility])    urologist--- dr Annabell Howells--  first dx 02/ 2020 gleason 3+3, active surveillance;  bx 09-23-2020  Gleason 4+4, PSA 7.64   Spinal stenosis, cervical region    Uses self-applied continuous glucose monitoring device    DEX-COM    Patient Active Problem List   Diagnosis Date Noted   Deficiency anemia 09/27/2022   Dysuria 09/27/2022   Acute prostatitis with hematuria 09/27/2022   Iron deficiency anemia secondary to inadequate dietary iron intake 09/27/2022    Abnormal electrocardiogram 03/09/2022   Chronic kidney disease, stage 3a (HCC) 09/16/2021   Long term (current) use of insulin (HCC) 09/16/2021   Low back pain 09/16/2021   Uric acid crystalluria 09/07/2021   Chronic eczematous otitis externa of both ears 09/07/2021   Gastroesophageal reflux disease without esophagitis 09/07/2021   Cervical stenosis of spinal canal 12/15/2020   Gouty renal disease 05/13/2020   ED (erectile dysfunction) of organic origin 01/15/2020   Right kidney stone 11/26/2019   Diuretic-induced hypokalemia 08/29/2019   Prostate cancer (HCC) 09/06/2018   Tobacco abuse 07/17/2018   Essential hypertension 03/07/2018   Seasonal allergic rhinitis due to pollen 09/26/2017   Sleep apnea in adult 06/24/2015   DDD (degenerative disc disease), cervical 11/10/2014   Background diabetic retinopathy (HCC) 08/05/2013   Hyperlipidemia with target LDL less than 100 11/05/2012   LADA (latent autoimmune diabetes in adults), managed as type 1 (HCC) 06/26/2012    Past Surgical History:  Procedure Laterality Date   ANTERIOR CERVICAL DECOMP/DISCECTOMY FUSION N/A 12/15/2020   Procedure: Cervical Three-Four Cervical Four-Five Anterior cervical decompression/discectomy/fusion;  Surgeon: Maeola Harman, MD;  Location: [facility] OR;  Service: Neurosurgery;  Laterality: N/A;   COLONOSCOPY  2018   x2   CYSTOSCOPY  02/09/2021   Procedure: CYSTOSCOPY FLEXIBLE;  Surgeon: Bjorn Pippin, MD;  Location: [facility];  Service: Urology;;   CYSTOSCOPY W/ URETERAL STENT PLACEMENT Right 05/12/2015   Procedure: CYSTOSCOPY WITH STENT REPLACEMENT;  Surgeon: Malen Gauze, MD;  Location: [facility];  Service: Urology;  Laterality: Right;   CYSTOSCOPY WITH RETROGRADE PYELOGRAM, URETEROSCOPY AND STENT PLACEMENT Right 01/23/2019   Procedure: CYSTOSCOPY WITH RIGHT RETROGRADE PYELOGRAM, URETEROSCOPY AND STENT PLACEMENT;  Surgeon: Bjorn Pippin, MD;  Location: [facility];  Service: Urology;   Laterality: Right;   CYSTOSCOPY WITH URETEROSCOPY AND STENT PLACEMENT Right 04/23/2015   Procedure: CYSTOSCOPY WITH URETEROSCOPY RETROGRADE PYELOGRAM AND STENT PLACEMENT;  Surgeon: Malen Gauze, MD;  Location: [facility];  Service: Urology;  Laterality: Right;   CYSTOSCOPY/RETROGRADE/URETEROSCOPY/STONE EXTRACTION WITH BASKET Right 05/12/2015   Procedure: CYSTOSCOPY/RETROGRADE/URETEROSCOPY/STONE EXTRACTION WITH BASKET;  Surgeon: Malen Gauze, MD;  Location: [facility];  Service: Urology;  Laterality: Right;   CYSTOSCOPY/URETEROSCOPY/HOLMIUM LASER/STENT PLACEMENT Left 09/18/2018   Procedure: CYSTOSCOPY/ LEFT RETROGRADE LEFT URETEROSCOPY /HOLMIUM LASER/STENT PLACEMENT;  Surgeon: Bjorn Pippin, MD;  Location: [facility];  Service: Urology;  Laterality: Left;   CYSTOSCOPY/URETEROSCOPY/HOLMIUM LASER/STENT PLACEMENT Right 10/24/2019   Procedure: CYSTOSCOPY/URETEROSCOPY/HOLMIUM LASER/STENT PLACEMENT;  Surgeon: Bjorn Pippin, MD;  Location: [facility];  Service: Urology;  Laterality: Right;   CYSTOSCOPY/URETEROSCOPY/HOLMIUM LASER/STENT PLACEMENT Left 01/30/2020   @[facility]    CYSTOSCOPY/URETEROSCOPY/HOLMIUM LASER/STENT PLACEMENT Left 07/13/2021   Procedure: CYSTOSCOPY/URETEROSCOPY/HOLMIUM LASER/STENT PLACEMENT;  Surgeon: Crista Elliot, MD;  Location: [facility];  Service: Urology;  Laterality: Left;   EXTRACORPOREAL SHOCK WAVE LITHOTRIPSY  x2 in  2006//   x2  in 2012   HOLMIUM LASER APPLICATION Right 05/12/2015   Procedure: HOLMIUM LASER APPLICATION;  Surgeon: Malen Gauze, MD;  Location: [facility];  Service: Urology;  Laterality: Right;   HOLMIUM LASER APPLICATION Right 01/23/2019   Procedure: HOLMIUM LASER APPLICATION;  Surgeon: Bjorn Pippin, MD;  Location: [facility];  Service: Urology;  Laterality: Right;   IR URETERAL STENT RIGHT NEW ACCESS W/O SEP NEPHROSTOMY CATH  11/25/2019   LAPAROSCOPIC APPENDECTOMY N/A 11/07/2012   Procedure: APPENDECTOMY  LAPAROSCOPIC;  Surgeon: Shelly Rubenstein, MD;  Location: [facility] OR;  Service: General;  Laterality: N/A;   NEPHROLITHOTOMY Left 08/06/2015   Procedure: 1ST STAGE  LEFT PERCUTANEOUS NEPHROLITHOTOMY ;  Surgeon: Bjorn Pippin, MD;  Location: [facility];  Service: Urology;  Laterality: Left;   NEPHROLITHOTOMY Right 11/26/2019   Procedure: NEPHROLITHOTOMY PERCUTANEOUS;  Surgeon: Bjorn Pippin, MD;  Location: [facility];  Service: Urology;  Laterality: Right;   NEPHROLITHOTOMY Right 11/28/2019   Procedure: NEPHROLITHOTOMY PERCUTANEOUS SECOND LOOK;  Surgeon: Sebastian Ache, MD;  Location: [facility];  Service: Urology;  Laterality: Right;   RADIOACTIVE SEED IMPLANT N/A 02/09/2021   Procedure: RADIOACTIVE SEED IMPLANT/BRACHYTHERAPY IMPLANT;  Surgeon: Bjorn Pippin, MD;  Location: [facility];  Service: Urology;  Laterality: N/A;   SPACE OAR INSTILLATION N/A 02/09/2021   Procedure: SPACE OAR INSTILLATION;  Surgeon: Bjorn Pippin, MD;  Location: [facility];  Service: Urology;  Laterality: N/A;   TONSILLECTOMY  1975   TRANSURETHRAL RESECTION OF BLADDER TUMOR  1998       Home Medications    Prior to Admission medications   Medication Sig Start Date End Date Taking? Authorizing Provider  allopurinol (ZYLOPRIM) 100 MG tablet TAKE 1 TABLET BY MOUTH EVERY DAY 08/27/22  Yes Etta Grandchild, MD  aspirin EC 81 MG tablet Take 81 mg by mouth in the morning.   Yes [provider]  candesartan (ATACAND) 16 MG tablet TAKE 1 TABLET BY  MOUTH EVERY DAY 09/02/22  Yes Etta Grandchild, MD  Ciclopirox 0.77 % gel APPLY TO AFFECTED AREA TWICE A DAY 05/28/22  Yes Etta Grandchild, MD  Continuous Blood Gluc Receiver (DEXCOM G6 RECEIVER) DEVI See admin instructions. 09/10/18  Yes [provider]  Continuous Blood Gluc Sensor (DEXCOM G6 SENSOR) MISC change sensor 09/10/18  Yes [provider]  Continuous Blood Gluc Transmit (DEXCOM G6 TRANSMITTER) MISC See admin instructions.   Yes [provider]  ezetimibe (ZETIA) 10 MG tablet TAKE 1 TABLET BY MOUTH EVERY DAY 07/07/22  Yes Etta Grandchild, MD  insulin aspart (NOVOLOG) 100 UNIT/ML injection Inject into the skin as directed. USE VIA INSULIN PUMP TO ADMINISTER UP TO 130 UNITS MAXIUM DAILY   Yes [provider]  NOVOLOG FLEXPEN 100 UNIT/ML FlexPen Inject 12-15 Units into the skin 3 (three) times daily before meals. WHEN NOT USING INSULIN PUMP ;  WHEN FASTING OR UNABLE TO EAT ONLY WHEN NEEDED TO TREAT BLOOD SUGAR >200 09/22/20  Yes [provider]  potassium chloride SA (KLOR-CON M20) 20 MEQ tablet Take 20 mEq by mouth 3 (three) times daily. 08/29/19  Yes [provider]  SUTAB [phone] MG TABS See admin instructions. 11/13/22  Yes [provider]  tamsulosin (FLOMAX) 0.4 MG CAPS capsule Take 0.4 mg by mouth in the morning. 11/16/20  Yes [provider]  TRESIBA FLEXTOUCH 100 UNIT/ML FlexTouch Pen Inject into the skin. 09/26/22  Yes [provider]  Alirocumab (PRALUENT) 75 MG/ML SOAJ Inject 75 mg into the skin every 14 (fourteen) days. Patient not taking: Reported on 09/27/2022 06/07/22   Meriam Sprague, MD  diphenhydrAMINE (ALLERGY RELIEF) 25 MG tablet  11/18/19   [provider]  Ferric Maltol (ACCRUFER) 30 MG CAPS Take 1 capsule (30 mg total) by mouth in the morning and at bedtime. 09/27/22   Etta Grandchild, MD  glucose blood (ONETOUCH VERIO) test strip 5X A DAY    [provider]  GVOKE HYPOPEN 2-PACK 1 MG/0.2ML SOAJ INJECT 1 ACT INTO THE SKIN DAILY AS NEEDED (HYPOGLYCEMIA). 11/04/21   Etta Grandchild, MD  indapamide (LOZOL) 1.25 MG tablet TAKE 1 TABLET BY MOUTH EVERY DAY 09/02/22   Etta Grandchild, MD  Insulin Glargine (LANTUS SOLOSTAR [place]) Inject into the skin 2 (two) times daily. 25 UNITS IN AM AND  20--25 UNITS;  WHEN FASTING OR UNABLE TO EAT LOWER TO 18 UNITS TWICE DAILY    [provider]  Insulin Human (INSULIN PUMP) SOLN Inject 1 each into  the skin continuous. Novolog insulin    [provider]  levocetirizine (XYZAL) 5 MG tablet TAKE 1 TABLET BY MOUTH DAILY AS NEEDED FOR ALLERGIES. 07/29/22   Etta Grandchild, MD  mesalamine (CANASA) 1000 MG suppository Place 1,000 mg rectally at bedtime. 09/02/22   [provider]  metaxalone (SKELAXIN) 400 MG tablet Take 400 mg by mouth as needed. 06/03/19   [provider]  neomycin-polymyxin-hydrocortisone (CORTISPORIN) 3.5-10000-1 OTIC suspension Place 3 drops into both ears 3 (three) times daily. Patient not taking: Reported on 09/27/2022 09/07/21   Etta Grandchild, MD  nicotine (NICODERM CQ - DOSED IN MG/24 HOURS) 14 mg/24hr patch PLACE 1 PATCH ONTO THE SKIN DAILY. Patient not taking: Reported on 09/27/2022 04/01/22   Etta Grandchild, MD  nystatin (MYCOSTATIN/NYSTOP) powder Apply 1 Application topically. 08/26/19   [provider]  omeprazole (PRILOSEC) 20 MG capsule TAKE 1 CAPSULE BY MOUTH EVERY DAY 06/18/22  Etta Grandchild, MD  Polyethyl Glycol-Propyl Glycol (SYSTANE OP) Place 1 drop into both eyes 3 (three) times daily as needed (dry/irritated eyes).    [provider]  Potassium Citrate 15 MEQ (1620 MG) TBCR Take 1 tablet by mouth in the morning and at bedtime.    [provider]  predniSONE (DELTASONE) 50 MG tablet Take 50 mg by mouth. 07/13/22   [provider]  Propylene Glycol (SYSTANE COMPLETE) 0.6 % SOLN Apply to eye. 09/20/19   [provider]    Family History Family History  Problem Relation Age of Onset   Cancer Other        Colon and Prostate Cancer   Hypertension Other    Diabetes Other    Alcohol abuse Neg Hx    Drug abuse Neg Hx    Early death Neg Hx    Heart disease Neg Hx    Hyperlipidemia Neg Hx    Kidney disease Neg Hx    Stroke Neg Hx     Social History Social History   Tobacco Use   Smoking status: Every Day    Packs/day: 0.50    Years: 40.00    Additional pack years: 0.00    Total pack  years: 20.00    Types: Cigarettes    Passive exposure: Current   Smokeless tobacco: Never  Vaping Use   Vaping Use: Never used  Substance Use Topics   Alcohol use: Not Currently    Alcohol/week: 5.0 standard drinks of alcohol    Types: 5 Shots of liquor per week    Comment: occasional   Drug use: No     Allergies   Altace [ramipril], Crestor [rosuvastatin], Lipitor [atorvastatin], Dilaudid [hydromorphone], Iodinated contrast media, Iodine, Latex, Oxycodone, Adhesive [tape], and Peanut-containing drug products   Review of Systems Review of Systems Per HPI  Physical Exam Triage Vital Signs ED Triage Vitals  Enc Vitals Group     BP 11/20/22 1348 117/74     Pulse Rate 11/20/22 1348 71     Resp 11/20/22 1348 18     Temp 11/20/22 1348 98.2 F (36.8 C)     Temp Source 11/20/22 1348 Oral     SpO2 11/20/22 1348 96 %     Weight 11/20/22 1340 260 lb (117.9 kg)     Height 11/20/22 1340 6\' 1"  (1.854 m)     Head Circumference --      Peak Flow --      Pain Score 11/20/22 1339 6     Pain Loc --      Pain Edu? --      Excl. in GC? --    No data found.  Updated Vital Signs BP 117/74 (BP Location: Left Arm)   Pulse 71   Temp 98.2 F (36.8 C) (Oral)   Resp 18   Ht 6\' 1"  (1.854 m)   Wt 260 lb (117.9 kg)   SpO2 96%   BMI 34.30 kg/m   Visual Acuity Right Eye Distance: 20/20 Left Eye Distance: 20/70 Bilateral Distance: 20/20  Right Eye Near:   Left Eye Near:    Bilateral Near:     Physical Exam Constitutional:      General: He is not in acute distress.    Appearance: Normal appearance. He is not toxic-appearing or diaphoretic.  HENT:     Head: Normocephalic and atraumatic.  Eyes:     General: Lids are normal. Lids are everted, no foreign bodies appreciated. Vision grossly intact.  Gaze aligned appropriately.     Extraocular Movements: Extraocular movements intact.     Conjunctiva/sclera: Conjunctivae normal.     Pupils: Pupils are equal, round, and reactive to  light.     Left eye: No corneal abrasion or fluorescein uptake.     Comments: Has diffuse scleral redness throughout left eye.  Conjunctive appears and no drainage noted.  No fluorescein reuptake noted.   Pulmonary:     Effort: Pulmonary effort is normal.  Neurological:     General: No focal deficit present.     Mental Status: He is alert and oriented to person, place, and time. Mental status is at baseline.  Psychiatric:        Mood and Affect: Mood normal.        Behavior: Behavior normal.        Thought Content: Thought content normal.        Judgment: Judgment normal.      UC Treatments / Results  Labs (all labs ordered are listed, but only abnormal results are displayed) Labs Reviewed - No data to display  EKG   Radiology No results found.  Procedures Procedures (including critical care time)  Medications Ordered in UC Medications - No data to display  Initial Impression / Assessment and Plan / UC Course  I have reviewed the triage vital signs and the nursing notes.  Pertinent labs & imaging results that were available during my care of the patient were reviewed by me and considered in my medical decision making (see chart for details).     Patient presenting with left eye irritation of unknown etiology.  Fluorescein stain completed with no obvious abnormalities.  I am concerned given that patient's visual acuity is abnormal in the left eye which patient reports is abnormal for him as he recently had an evaluation and had normal vision.  Called Dr. Charlotte Sanes with on-call ophthalmology.  She advised that she would like to see him tomorrow at 8:30 AM to be further evaluated.  She also advised using lubricating eyedrops over-the-counter until she can evaluate him.  Discussed this with patient and he was agreeable.  Therefore, provided patient with address and he will follow-up tomorrow. Final Clinical Impressions(s) / UC Diagnoses   Final diagnoses:  Irritation of left eye      Discharge Instructions      Go to [facility] ophthalmology tomorrow at 8:30 AM to be evaluated.     ED Prescriptions   None    PDMP not reviewed this encounter.   Gustavus Bryant, [place] 11/20/22 1453    Gustavus Bryant, [place] 11/20/22 1453

## 2022-11-20 NOTE — ED Triage Notes (Signed)
"  My left eye is red, sore in left corner, some tearing and drainage". Painful with straining eye. First noticed "about 2 days ago". No fever. No injury known.

## 2022-11-21 DIAGNOSIS — H15102 Unspecified episcleritis, left eye: Secondary | ICD-10-CM | POA: Diagnosis not present

## 2022-11-25 DIAGNOSIS — H15102 Unspecified episcleritis, left eye: Secondary | ICD-10-CM | POA: Diagnosis not present

## 2022-11-30 DIAGNOSIS — Z86010 Personal history of colonic polyps: Secondary | ICD-10-CM | POA: Diagnosis not present

## 2022-11-30 DIAGNOSIS — Z09 Encounter for follow-up examination after completed treatment for conditions other than malignant neoplasm: Secondary | ICD-10-CM | POA: Diagnosis not present

## 2022-11-30 DIAGNOSIS — K635 Polyp of colon: Secondary | ICD-10-CM | POA: Diagnosis not present

## 2022-11-30 DIAGNOSIS — D125 Benign neoplasm of sigmoid colon: Secondary | ICD-10-CM | POA: Diagnosis not present

## 2022-11-30 DIAGNOSIS — D124 Benign neoplasm of descending colon: Secondary | ICD-10-CM | POA: Diagnosis not present

## 2022-11-30 LAB — HM COLONOSCOPY

## 2022-12-02 DIAGNOSIS — K635 Polyp of colon: Secondary | ICD-10-CM | POA: Diagnosis not present

## 2022-12-02 DIAGNOSIS — D124 Benign neoplasm of descending colon: Secondary | ICD-10-CM | POA: Diagnosis not present

## 2022-12-02 DIAGNOSIS — D125 Benign neoplasm of sigmoid colon: Secondary | ICD-10-CM | POA: Diagnosis not present

## 2022-12-02 NOTE — Telephone Encounter (Signed)
As long as you remain covered by the [facility] and there are no changes to your plan benefits, this request is approved for the following time period: 09/29/2022 - 09/29/2023

## 2022-12-06 ENCOUNTER — Encounter: Payer: Self-pay | Admitting: Internal Medicine

## 2022-12-06 DIAGNOSIS — H2513 Age-related nuclear cataract, bilateral: Secondary | ICD-10-CM | POA: Diagnosis not present

## 2022-12-06 DIAGNOSIS — E113313 Type 2 diabetes mellitus with moderate nonproliferative diabetic retinopathy with macular edema, bilateral: Secondary | ICD-10-CM | POA: Diagnosis not present

## 2022-12-06 DIAGNOSIS — H43813 Vitreous degeneration, bilateral: Secondary | ICD-10-CM | POA: Diagnosis not present

## 2022-12-06 DIAGNOSIS — H3582 Retinal ischemia: Secondary | ICD-10-CM | POA: Diagnosis not present

## 2022-12-11 ENCOUNTER — Other Ambulatory Visit: Payer: Self-pay | Admitting: Internal Medicine

## 2022-12-11 DIAGNOSIS — K219 Gastro-esophageal reflux disease without esophagitis: Secondary | ICD-10-CM

## 2022-12-15 ENCOUNTER — Encounter: Payer: Self-pay | Admitting: Internal Medicine

## 2022-12-15 ENCOUNTER — Ambulatory Visit (INDEPENDENT_AMBULATORY_CARE_PROVIDER_SITE_OTHER): Payer: Medicare Other | Admitting: Internal Medicine

## 2022-12-15 VITALS — BP 126/68 | HR 80 | Temp 98.0°F | Resp 16 | Ht 73.0 in | Wt 255.0 lb

## 2022-12-15 DIAGNOSIS — D508 Other iron deficiency anemias: Secondary | ICD-10-CM

## 2022-12-15 DIAGNOSIS — R82998 Other abnormal findings in urine: Secondary | ICD-10-CM

## 2022-12-15 DIAGNOSIS — N1831 Chronic kidney disease, stage 3a: Secondary | ICD-10-CM

## 2022-12-15 DIAGNOSIS — N411 Chronic prostatitis: Secondary | ICD-10-CM

## 2022-12-15 DIAGNOSIS — I10 Essential (primary) hypertension: Secondary | ICD-10-CM | POA: Diagnosis not present

## 2022-12-15 DIAGNOSIS — N410 Acute prostatitis: Secondary | ICD-10-CM | POA: Diagnosis not present

## 2022-12-15 DIAGNOSIS — E139 Other specified diabetes mellitus without complications: Secondary | ICD-10-CM | POA: Diagnosis not present

## 2022-12-15 LAB — CBC WITH DIFFERENTIAL/PLATELET
Basophils Absolute: 0.1 10*3/uL (ref 0.0–0.1)
Basophils Relative: 0.4 % (ref 0.0–3.0)
Eosinophils Absolute: 0.5 10*3/uL (ref 0.0–0.7)
Eosinophils Relative: 3.1 % (ref 0.0–5.0)
HCT: 41.7 % (ref 39.0–52.0)
Hemoglobin: 13.4 g/dL (ref 13.0–17.0)
Lymphocytes Relative: 21.4 % (ref 12.0–46.0)
Lymphs Abs: 3.1 10*3/uL (ref 0.7–4.0)
MCHC: 32.2 g/dL (ref 30.0–36.0)
MCV: 86.6 fl (ref 78.0–100.0)
Monocytes Absolute: 0.9 10*3/uL (ref 0.1–1.0)
Monocytes Relative: 6.4 % (ref 3.0–12.0)
Neutro Abs: 9.9 10*3/uL — ABNORMAL HIGH (ref 1.4–7.7)
Neutrophils Relative %: 68.7 % (ref 43.0–77.0)
Platelets: 261 10*3/uL (ref 150.0–400.0)
RBC: 4.82 Mil/uL (ref 4.22–5.81)
RDW: 16.6 % — ABNORMAL HIGH (ref 11.5–15.5)
WBC: 14.5 10*3/uL — ABNORMAL HIGH (ref 4.0–10.5)

## 2022-12-15 LAB — URINALYSIS, ROUTINE W REFLEX MICROSCOPIC
Bilirubin Urine: NEGATIVE
Ketones, ur: NEGATIVE
Nitrite: NEGATIVE
Specific Gravity, Urine: 1.02 (ref 1.000–1.030)
Total Protein, Urine: 30 — AB
Urine Glucose: NEGATIVE
Urobilinogen, UA: 0.2 (ref 0.0–1.0)
pH: 7.5 (ref 5.0–8.0)

## 2022-12-15 LAB — BASIC METABOLIC PANEL
BUN: 15 mg/dL (ref 6–23)
CO2: 28 mEq/L (ref 19–32)
Calcium: 9 mg/dL (ref 8.4–10.5)
Chloride: 101 mEq/L (ref 96–112)
Creatinine, Ser: 1.1 mg/dL (ref 0.40–1.50)
GFR: 68.13 mL/min (ref >60.00)
Glucose, Bld: 159 mg/dL — ABNORMAL HIGH (ref 70–99)
Potassium: 4.2 mEq/L (ref 3.5–5.1)
Sodium: 137 mEq/L (ref 135–145)

## 2022-12-15 LAB — IBC + FERRITIN
Ferritin: 105.8 ng/mL (ref 22.0–322.0)
Iron: 40 ug/dL — ABNORMAL LOW (ref 42–165)
Saturation Ratios: 15 % — ABNORMAL LOW (ref 20.0–50.0)
TIBC: 267.4 ug/dL (ref 250.0–450.0)
Transferrin: 191 mg/dL — ABNORMAL LOW (ref 212.0–360.0)

## 2022-12-15 LAB — MICROALBUMIN / CREATININE URINE RATIO
Creatinine,U: 165.6 mg/dL
Microalb Creat Ratio: 13.9 mg/g (ref 0.0–30.0)
Microalb, Ur: 23.1 mg/dL — ABNORMAL HIGH (ref 0.0–1.9)

## 2022-12-15 MED ORDER — DOXYCYCLINE HYCLATE 100 MG PO CAPS
100.0000 mg | ORAL_CAPSULE | Freq: Two times a day (BID) | ORAL | 0 refills | Status: AC
Start: 2022-12-15 — End: 2023-01-14

## 2022-12-15 NOTE — Progress Notes (Signed)
Subjective:  [name]., male    DOB: [birth year]  Age: 72 y.o. MRN: [id number]  CC: Urinary Tract Infection   HPI Anthony Daniel. presents for f/up ---  He continues to complain of an achy sensation in his pelvis with urinary frequency and difficulty urinating.  He denies dysuria, hematuria, abdominal pain, flank pain, nausea, vomiting, fever, or chills.  Outpatient Medications Prior to Visit  Medication Sig Dispense Refill   allopurinol (ZYLOPRIM) 100 MG tablet TAKE 1 TABLET BY MOUTH EVERY DAY 90 tablet 1   aspirin EC 81 MG tablet Take 81 mg by mouth in the morning.     candesartan (ATACAND) 16 MG tablet TAKE 1 TABLET BY MOUTH EVERY DAY 90 tablet 0   Ciclopirox 0.77 % gel APPLY TO AFFECTED AREA TWICE A DAY 30 g 2   Continuous Blood Gluc Receiver (DEXCOM G6 RECEIVER) DEVI See admin instructions.     Continuous Blood Gluc Sensor (DEXCOM G6 SENSOR) MISC change sensor     Continuous Blood Gluc Transmit (DEXCOM G6 TRANSMITTER) MISC See admin instructions.     ezetimibe (ZETIA) 10 MG tablet TAKE 1 TABLET BY MOUTH EVERY DAY 90 tablet 1   Ferric Maltol (ACCRUFER) 30 MG CAPS Take 1 capsule (30 mg total) by mouth in the morning and at bedtime. 180 capsule 0   glucose blood (ONETOUCH VERIO) test strip 5X A DAY     GVOKE HYPOPEN 2-PACK 1 MG/0.2ML SOAJ INJECT 1 ACT INTO THE SKIN DAILY AS NEEDED (HYPOGLYCEMIA). 0.2 mL 3   indapamide (LOZOL) 1.25 MG tablet TAKE 1 TABLET BY MOUTH EVERY DAY 90 tablet 0   insulin aspart (NOVOLOG) 100 UNIT/ML injection Inject into the skin as directed. USE VIA INSULIN PUMP TO ADMINISTER UP TO 130 UNITS MAXIUM DAILY     Insulin Glargine (LANTUS SOLOSTAR Robbins) Inject into the skin 2 (two) times daily. 25 UNITS IN AM AND  20--25 UNITS;  WHEN FASTING OR UNABLE TO EAT LOWER TO 18 UNITS TWICE DAILY     Insulin Human (INSULIN PUMP) SOLN Inject 1 each into the skin continuous. Novolog insulin     levocetirizine (XYZAL) 5 MG tablet TAKE 1 TABLET BY MOUTH  DAILY AS NEEDED FOR ALLERGIES. 90 tablet 1   mesalamine (CANASA) 1000 MG suppository Place 1,000 mg rectally at bedtime.     neomycin-polymyxin-hydrocortisone (CORTISPORIN) 3.5-10000-1 OTIC suspension Place 3 drops into both ears 3 (three) times daily. 10 mL 2   nicotine (NICODERM CQ - DOSED IN MG/24 HOURS) 14 mg/24hr patch PLACE 1 PATCH ONTO THE SKIN DAILY. 28 patch 0   NOVOLOG FLEXPEN 100 UNIT/ML FlexPen Inject 12-15 Units into the skin 3 (three) times daily before meals. WHEN NOT USING INSULIN PUMP ;  WHEN FASTING OR UNABLE TO EAT ONLY WHEN NEEDED TO TREAT BLOOD SUGAR >200     nystatin (MYCOSTATIN/NYSTOP) powder Apply 1 Application topically.     omeprazole (PRILOSEC) 20 MG capsule TAKE 1 CAPSULE BY MOUTH EVERY DAY 90 capsule 1   Polyethyl Glycol-Propyl Glycol (SYSTANE OP) Place 1 drop into both eyes 3 (three) times daily as needed (dry/irritated eyes).     potassium chloride SA (KLOR-CON M20) 20 MEQ tablet Take 20 mEq by mouth 3 (three) times daily.     Potassium Citrate 15 MEQ (1620 MG) TBCR Take 1 tablet by mouth in the morning and at bedtime.     Propylene Glycol (SYSTANE COMPLETE) 0.6 % SOLN Apply to eye.     SUTAB  [id number] MG TABS See admin instructions.     tamsulosin (FLOMAX) 0.4 MG CAPS capsule Take 0.4 mg by mouth in the morning.     TRESIBA FLEXTOUCH 100 UNIT/ML FlexTouch Pen Inject into the skin.     diphenhydrAMINE (ALLERGY RELIEF) 25 MG tablet      metaxalone (SKELAXIN) 400 MG tablet Take 400 mg by mouth as needed.     predniSONE (DELTASONE) 50 MG tablet Take 50 mg by mouth.     Alirocumab (PRALUENT) 75 MG/ML SOAJ Inject 75 mg into the skin every 14 (fourteen) days. (Patient not taking: Reported on 09/27/2022) 2 mL 11   No facility-administered medications prior to visit.    ROS Review of Systems  Constitutional: Negative.  Negative for chills, diaphoresis, fatigue and fever.  HENT: Negative.    Eyes: Negative.   Respiratory:  Negative for cough, chest tightness and  wheezing.   Cardiovascular:  Negative for chest pain, palpitations and leg swelling.  Gastrointestinal:  Negative for abdominal pain, diarrhea, nausea and vomiting.  Endocrine: Negative.   Genitourinary:  Positive for difficulty urinating and frequency. Negative for decreased urine volume, dysuria, flank pain, hematuria, penile discharge, penile swelling, scrotal swelling and testicular pain.  Musculoskeletal: Negative.   Skin: Negative.   Neurological:  Negative for dizziness and weakness.  Hematological:  Negative for adenopathy. Does not bruise/bleed easily.  Psychiatric/Behavioral: Negative.      Objective:  BP 126/68 (BP Location: Left Arm, Patient Position: Sitting, Cuff Size: Large)   Pulse 80   Temp 98 F (36.7 C) (Oral)   Resp 16   Ht 6\' 1"  (1.854 m)   Wt 255 lb (115.7 kg)   SpO2 96%   BMI 33.64 kg/m   BP Readings from Last 3 Encounters:  12/15/22 126/68  11/20/22 117/74  09/27/22 136/78    Wt Readings from Last 3 Encounters:  12/15/22 255 lb (115.7 kg)  11/20/22 260 lb (117.9 kg)  09/27/22 250 lb (113.4 kg)    Physical Exam Vitals reviewed. Exam conducted with a chaperone present Malva Limes).  Constitutional:      Appearance: Normal appearance.  HENT:     Nose: Nose normal.     Mouth/Throat:     Mouth: Mucous membranes are moist.  Eyes:     General: No scleral icterus.    Conjunctiva/sclera: Conjunctivae normal.  Cardiovascular:     Rate and Rhythm: Normal rate and regular rhythm.     Heart sounds: No murmur heard. Pulmonary:     Effort: Pulmonary effort is normal.     Breath sounds: No stridor. No wheezing, rhonchi or rales.  Abdominal:     General: Abdomen is flat.     Palpations: There is no mass.     Tenderness: There is no abdominal tenderness. There is no guarding.     Hernia: No hernia is present. There is no hernia in the left inguinal area or right inguinal area.  Genitourinary:    Pubic Area: No rash.      Penis: Normal and  circumcised.      Testes: Normal.     Epididymis:     Right: Normal.     Left: Normal.     Prostate: Normal. Not enlarged, not tender and no nodules present.     Rectum: Normal. Guaiac result negative. No mass, tenderness, anal fissure, external hemorrhoid or internal hemorrhoid. Normal anal tone.  Musculoskeletal:        General: Normal range of motion.  Cervical back: Neck supple.     Right lower leg: No edema.     Left lower leg: No edema.  Lymphadenopathy:     Cervical: No cervical adenopathy.     Lower Body: No right inguinal adenopathy. No left inguinal adenopathy.  Skin:    General: Skin is warm and dry.  Neurological:     General: No focal deficit present.     Mental Status: He is alert.  Psychiatric:        Mood and Affect: Mood normal.        Behavior: Behavior normal.     Lab Results  Component Value Date   WBC 14.5 (H) 12/15/2022   HGB 13.4 12/15/2022   HCT 41.7 12/15/2022   PLT 261.0 12/15/2022   GLUCOSE 159 (H) 12/15/2022   CHOL 125 09/21/2022   TRIG 84 09/21/2022   HDL 37 (L) 09/21/2022   LDLCALC 71 09/21/2022   ALT 12 09/20/2022   AST 11 (A) 09/20/2022   NA 137 12/15/2022   K 4.2 12/15/2022   CL 101 12/15/2022   CREATININE 1.10 12/15/2022   BUN 15 12/15/2022   CO2 28 12/15/2022   TSH 0.70 09/20/2022   PSA 0.2 09/20/2022   INR 0.9 02/05/2021   HGBA1C 8.2 09/20/2022   MICROALBUR 23.1 (H) 12/15/2022    No results found.  Assessment & Plan:  Chronic kidney disease, stage 3a (HCC) -     Basic metabolic panel; Future -     Urinalysis, Routine w reflex microscopic; Future -     Microalbumin / creatinine urine ratio; Future  Iron deficiency anemia secondary to inadequate dietary iron intake- Will continue the iron supplement. -     IBC + Ferritin; Future -     CBC with Differential/Platelet; Future  LADA (latent autoimmune diabetes in adults), managed as type 1 (HCC)- Will continue the ARB. -     Microalbumin / creatinine urine ratio;  Future  Uric acid crystalluria -     Urinalysis, Routine w reflex microscopic; Future  Essential hypertension- His blood pressure is well-controlled. -     Basic metabolic panel; Future  Acute on chronic prostatitis- Will treat with doxycycline. -     Doxycycline Hyclate; Take 1 capsule (100 mg total) by mouth 2 (two) times daily.  Dispense: 60 capsule; Refill: 0     Follow-up: Return in about 4 months (around 04/17/2023).  Sanda Linger, MD

## 2022-12-15 NOTE — Patient Instructions (Signed)
Iron Deficiency Anemia, Adult  Iron deficiency anemia is a condition in which the concentration of red blood cells or hemoglobin in the blood is below normal because of too little iron. Hemoglobin is a substance in red blood cells that carries oxygen to the body's tissues. When the concentration of red blood cells or hemoglobin is too low, not enough oxygen reaches these tissues. Iron deficiency anemia is usually long-lasting, and it develops over time. It may or may not cause symptoms. It is a common type of anemia. What are the causes? This condition may be caused by: Not enough iron in the diet. Abnormal absorption in the gut. Blood loss. What increases the risk? You are more likely to develop this condition if you get menstrual periods (menstruate) or are pregnant. What are the signs or symptoms? Symptoms of this condition may include: Pale skin, lips, and nail beds. Weakness, dizziness, and getting tired easily. Shortness of breath when moving or exercising. Cold hands or feet. Mild anemia may not cause any symptoms. How is this diagnosed? This condition is diagnosed based on: Your medical history. A physical exam. Blood tests. How is this treated? This condition is treated by correcting the cause of your iron deficiency. Treatment may involve: Adding iron-rich foods to your diet. Taking iron supplements. If you are pregnant or breastfeeding, you may need to take extra iron because your normal diet usually does not provide the amount of iron that you need. Increasing vitamin C intake. Vitamin C helps your body absorb iron. Your health care provider may recommend that you take iron supplements along with a glass of orange juice or a vitamin C supplement. Medicines to make heavy menstrual flow lighter. Surgery or additional testing procedures to determine the cause of your anemia. You may need repeat blood tests to determine whether treatment is working. If the treatment does not  seem to be working, you may need more tests. Follow these instructions at home: Medicines Take over-the-counter and prescription medicines only as told by your health care provider. This includes iron supplements and vitamins. This is important because too much iron can be harmful. For the best iron absorption, you should take iron supplements when your stomach is empty. If you cannot tolerate them on an empty stomach, you may need to take them with food. Do not drink milk or take antacids at the same time as your iron supplements. Milk and antacids may interfere with how your body absorbs iron. Iron supplements may turn stool (feces) a darker color and it may appear black. If you cannot tolerate taking iron supplements by mouth, talk with your health care provider about taking them through an IV or through an injection into a muscle. Eating and drinking Talk with your health care provider before changing your diet. Your provider may recommend that you eat foods that contain a lot of iron, such as: Liver. Low-fat (lean) beef. Breads and cereals that have iron added to them (are fortified). Eggs. Dried fruit. Dark green, leafy vegetables. To help your body use the iron from iron-rich foods, eat those foods at the same time as fresh fruits and vegetables that are high in vitamin C. Foods that are high in vitamin C include: Oranges. Peppers. Tomatoes. Mangoes. Managing constipation If you are taking an iron supplement, it may cause constipation. To prevent or treat constipation, you may need to: Drink enough fluid to keep your urine pale yellow. Take over-the-counter or prescription medicines. Eat foods that are high in fiber, such  as beans, whole grains, and fresh fruits and vegetables. Limit foods that are high in fat and processed sugars, such as fried or sweet foods. General instructions Return to your normal activities as told by your health care provider. Ask your health care provider  what activities are safe for you. Keep all follow-up visits. Contact a health care provider if: You feel nauseous or you vomit. You feel weak. You become light-headed when getting up from a sitting or lying down position. You have unexplained sweating. You develop symptoms of constipation. You have a heaviness in your chest. You have trouble breathing with physical activity. Get help right away if: You faint. If this happens, do not drive yourself to the hospital. You have an irregular or rapid heartbeat. Summary Iron deficiency anemia is a condition in which the concentration of red blood cells or hemoglobin in the blood is below normal because of too little iron. This condition is treated by correcting the cause of your iron deficiency. Take over-the-counter and prescription medicines only as told by your health care provider. This includes iron supplements and vitamins. To help your body use the iron from iron-rich foods, eat those foods at the same time as fresh fruits and vegetables that are high in vitamin C. Seek medical help if you have signs or symptoms of worsening anemia. This information is not intended to replace advice given to you by your health care provider. Make sure you discuss any questions you have with your health care provider. Document Revised: 08/11/2021 Document Reviewed: 08/11/2021 Elsevier Patient Education  2024 [employer].

## 2022-12-19 DIAGNOSIS — C61 Malignant neoplasm of prostate: Secondary | ICD-10-CM | POA: Diagnosis not present

## 2022-12-20 DIAGNOSIS — E113312 Type 2 diabetes mellitus with moderate nonproliferative diabetic retinopathy with macular edema, left eye: Secondary | ICD-10-CM | POA: Diagnosis not present

## 2022-12-21 ENCOUNTER — Other Ambulatory Visit: Payer: Self-pay | Admitting: Internal Medicine

## 2022-12-21 DIAGNOSIS — E785 Hyperlipidemia, unspecified: Secondary | ICD-10-CM

## 2022-12-26 ENCOUNTER — Other Ambulatory Visit: Payer: Self-pay | Admitting: Internal Medicine

## 2022-12-26 DIAGNOSIS — B354 Tinea corporis: Secondary | ICD-10-CM

## 2023-01-03 ENCOUNTER — Encounter: Payer: Self-pay | Admitting: Internal Medicine

## 2023-01-03 ENCOUNTER — Other Ambulatory Visit: Payer: Self-pay | Admitting: Internal Medicine

## 2023-01-03 DIAGNOSIS — N1831 Chronic kidney disease, stage 3a: Secondary | ICD-10-CM | POA: Diagnosis not present

## 2023-01-03 DIAGNOSIS — D508 Other iron deficiency anemias: Secondary | ICD-10-CM

## 2023-01-03 DIAGNOSIS — Z713 Dietary counseling and surveillance: Secondary | ICD-10-CM | POA: Diagnosis not present

## 2023-01-03 DIAGNOSIS — Z6832 Body mass index (BMI) 32.0-32.9, adult: Secondary | ICD-10-CM | POA: Diagnosis not present

## 2023-01-03 DIAGNOSIS — E669 Obesity, unspecified: Secondary | ICD-10-CM | POA: Diagnosis not present

## 2023-01-03 MED ORDER — ACCRUFER 30 MG PO CAPS
1.0000 | ORAL_CAPSULE | Freq: Two times a day (BID) | ORAL | 0 refills | Status: DC
Start: 2023-01-03 — End: 2023-01-04

## 2023-01-04 ENCOUNTER — Other Ambulatory Visit: Payer: Self-pay | Admitting: Internal Medicine

## 2023-01-04 DIAGNOSIS — N182 Chronic kidney disease, stage 2 (mild): Secondary | ICD-10-CM | POA: Diagnosis not present

## 2023-01-04 DIAGNOSIS — Z87442 Personal history of urinary calculi: Secondary | ICD-10-CM | POA: Diagnosis not present

## 2023-01-04 DIAGNOSIS — D508 Other iron deficiency anemias: Secondary | ICD-10-CM

## 2023-01-04 DIAGNOSIS — Z794 Long term (current) use of insulin: Secondary | ICD-10-CM | POA: Diagnosis not present

## 2023-01-04 DIAGNOSIS — E1022 Type 1 diabetes mellitus with diabetic chronic kidney disease: Secondary | ICD-10-CM | POA: Diagnosis not present

## 2023-01-04 DIAGNOSIS — E669 Obesity, unspecified: Secondary | ICD-10-CM | POA: Diagnosis not present

## 2023-01-04 DIAGNOSIS — E785 Hyperlipidemia, unspecified: Secondary | ICD-10-CM | POA: Diagnosis not present

## 2023-01-04 MED ORDER — ACCRUFER 30 MG PO CAPS
1.0000 | ORAL_CAPSULE | Freq: Two times a day (BID) | ORAL | 0 refills | Status: AC
Start: 2023-01-04 — End: ?

## 2023-01-19 ENCOUNTER — Other Ambulatory Visit: Payer: Self-pay | Admitting: Internal Medicine

## 2023-01-19 DIAGNOSIS — I10 Essential (primary) hypertension: Secondary | ICD-10-CM

## 2023-01-26 ENCOUNTER — Other Ambulatory Visit: Payer: Self-pay | Admitting: Internal Medicine

## 2023-01-26 DIAGNOSIS — J301 Allergic rhinitis due to pollen: Secondary | ICD-10-CM

## 2023-02-06 DIAGNOSIS — Z8546 Personal history of malignant neoplasm of prostate: Secondary | ICD-10-CM | POA: Diagnosis not present

## 2023-02-06 DIAGNOSIS — R300 Dysuria: Secondary | ICD-10-CM | POA: Diagnosis not present

## 2023-02-06 DIAGNOSIS — R350 Frequency of micturition: Secondary | ICD-10-CM | POA: Diagnosis not present

## 2023-02-06 DIAGNOSIS — N401 Enlarged prostate with lower urinary tract symptoms: Secondary | ICD-10-CM | POA: Diagnosis not present

## 2023-02-13 DIAGNOSIS — N453 Epididymo-orchitis: Secondary | ICD-10-CM | POA: Diagnosis not present

## 2023-02-13 DIAGNOSIS — R8271 Bacteriuria: Secondary | ICD-10-CM | POA: Diagnosis not present

## 2023-02-15 ENCOUNTER — Other Ambulatory Visit: Payer: Self-pay | Admitting: Internal Medicine

## 2023-02-15 DIAGNOSIS — I10 Essential (primary) hypertension: Secondary | ICD-10-CM

## 2023-02-15 DIAGNOSIS — M1030 Gout due to renal impairment, unspecified site: Secondary | ICD-10-CM

## 2023-02-15 DIAGNOSIS — E139 Other specified diabetes mellitus without complications: Secondary | ICD-10-CM

## 2023-02-15 DIAGNOSIS — R82998 Other abnormal findings in urine: Secondary | ICD-10-CM

## 2023-02-27 DIAGNOSIS — N453 Epididymo-orchitis: Secondary | ICD-10-CM | POA: Diagnosis not present

## 2023-02-27 DIAGNOSIS — R8279 Other abnormal findings on microbiological examination of urine: Secondary | ICD-10-CM | POA: Diagnosis not present

## 2023-03-07 DIAGNOSIS — N200 Calculus of kidney: Secondary | ICD-10-CM | POA: Diagnosis not present

## 2023-03-22 ENCOUNTER — Other Ambulatory Visit: Payer: Self-pay | Admitting: Internal Medicine

## 2023-03-22 DIAGNOSIS — K219 Gastro-esophageal reflux disease without esophagitis: Secondary | ICD-10-CM

## 2023-03-25 ENCOUNTER — Other Ambulatory Visit: Payer: Self-pay | Admitting: Internal Medicine

## 2023-03-25 DIAGNOSIS — E785 Hyperlipidemia, unspecified: Secondary | ICD-10-CM

## 2023-03-27 DIAGNOSIS — M1030 Gout due to renal impairment, unspecified site: Secondary | ICD-10-CM | POA: Diagnosis not present

## 2023-03-27 DIAGNOSIS — N5089 Other specified disorders of the male genital organs: Secondary | ICD-10-CM | POA: Diagnosis not present

## 2023-03-27 DIAGNOSIS — N289 Disorder of kidney and ureter, unspecified: Secondary | ICD-10-CM | POA: Diagnosis not present

## 2023-03-27 DIAGNOSIS — N200 Calculus of kidney: Secondary | ICD-10-CM | POA: Diagnosis not present

## 2023-03-27 DIAGNOSIS — R82991 Hypocitraturia: Secondary | ICD-10-CM | POA: Diagnosis not present

## 2023-03-27 DIAGNOSIS — R82992 Hyperoxaluria: Secondary | ICD-10-CM | POA: Diagnosis not present

## 2023-03-28 DIAGNOSIS — N453 Epididymo-orchitis: Secondary | ICD-10-CM | POA: Diagnosis not present

## 2023-03-29 DIAGNOSIS — E113312 Type 2 diabetes mellitus with moderate nonproliferative diabetic retinopathy with macular edema, left eye: Secondary | ICD-10-CM | POA: Diagnosis not present

## 2023-03-29 DIAGNOSIS — H3582 Retinal ischemia: Secondary | ICD-10-CM | POA: Diagnosis not present

## 2023-03-29 DIAGNOSIS — H4311 Vitreous hemorrhage, right eye: Secondary | ICD-10-CM | POA: Diagnosis not present

## 2023-03-29 DIAGNOSIS — E113511 Type 2 diabetes mellitus with proliferative diabetic retinopathy with macular edema, right eye: Secondary | ICD-10-CM | POA: Diagnosis not present

## 2023-03-29 DIAGNOSIS — H43813 Vitreous degeneration, bilateral: Secondary | ICD-10-CM | POA: Diagnosis not present

## 2023-03-29 DIAGNOSIS — H2513 Age-related nuclear cataract, bilateral: Secondary | ICD-10-CM | POA: Diagnosis not present

## 2023-03-31 DIAGNOSIS — N453 Epididymo-orchitis: Secondary | ICD-10-CM | POA: Diagnosis not present

## 2023-04-05 DIAGNOSIS — N182 Chronic kidney disease, stage 2 (mild): Secondary | ICD-10-CM | POA: Diagnosis not present

## 2023-04-05 DIAGNOSIS — E1022 Type 1 diabetes mellitus with diabetic chronic kidney disease: Secondary | ICD-10-CM | POA: Diagnosis not present

## 2023-04-05 DIAGNOSIS — Z87442 Personal history of urinary calculi: Secondary | ICD-10-CM | POA: Diagnosis not present

## 2023-04-05 DIAGNOSIS — Z794 Long term (current) use of insulin: Secondary | ICD-10-CM | POA: Diagnosis not present

## 2023-04-05 DIAGNOSIS — B353 Tinea pedis: Secondary | ICD-10-CM | POA: Diagnosis not present

## 2023-04-05 DIAGNOSIS — E669 Obesity, unspecified: Secondary | ICD-10-CM | POA: Diagnosis not present

## 2023-04-05 DIAGNOSIS — E785 Hyperlipidemia, unspecified: Secondary | ICD-10-CM | POA: Diagnosis not present

## 2023-04-05 LAB — HEMOGLOBIN A1C: Hemoglobin A1C: 7.8

## 2023-04-11 DIAGNOSIS — E113393 Type 2 diabetes mellitus with moderate nonproliferative diabetic retinopathy without macular edema, bilateral: Secondary | ICD-10-CM | POA: Diagnosis not present

## 2023-04-11 DIAGNOSIS — H18413 Arcus senilis, bilateral: Secondary | ICD-10-CM | POA: Diagnosis not present

## 2023-04-11 DIAGNOSIS — H2513 Age-related nuclear cataract, bilateral: Secondary | ICD-10-CM | POA: Diagnosis not present

## 2023-04-11 DIAGNOSIS — H25013 Cortical age-related cataract, bilateral: Secondary | ICD-10-CM | POA: Diagnosis not present

## 2023-04-11 DIAGNOSIS — H3581 Retinal edema: Secondary | ICD-10-CM | POA: Diagnosis not present

## 2023-04-11 DIAGNOSIS — H2511 Age-related nuclear cataract, right eye: Secondary | ICD-10-CM | POA: Diagnosis not present

## 2023-04-19 ENCOUNTER — Other Ambulatory Visit: Payer: Self-pay | Admitting: Internal Medicine

## 2023-04-19 DIAGNOSIS — I10 Essential (primary) hypertension: Secondary | ICD-10-CM

## 2023-04-24 DIAGNOSIS — H4311 Vitreous hemorrhage, right eye: Secondary | ICD-10-CM | POA: Diagnosis not present

## 2023-04-24 DIAGNOSIS — E113511 Type 2 diabetes mellitus with proliferative diabetic retinopathy with macular edema, right eye: Secondary | ICD-10-CM | POA: Diagnosis not present

## 2023-04-24 DIAGNOSIS — E113591 Type 2 diabetes mellitus with proliferative diabetic retinopathy without macular edema, right eye: Secondary | ICD-10-CM | POA: Diagnosis not present

## 2023-04-24 DIAGNOSIS — H2511 Age-related nuclear cataract, right eye: Secondary | ICD-10-CM | POA: Diagnosis not present

## 2023-05-01 DIAGNOSIS — Z6832 Body mass index (BMI) 32.0-32.9, adult: Secondary | ICD-10-CM | POA: Diagnosis not present

## 2023-05-01 DIAGNOSIS — N1831 Chronic kidney disease, stage 3a: Secondary | ICD-10-CM | POA: Diagnosis not present

## 2023-05-01 DIAGNOSIS — Z713 Dietary counseling and surveillance: Secondary | ICD-10-CM | POA: Diagnosis not present

## 2023-05-01 DIAGNOSIS — E66811 Obesity, class 1: Secondary | ICD-10-CM | POA: Diagnosis not present

## 2023-05-02 DIAGNOSIS — H31011 Macula scars of posterior pole (postinflammatory) (post-traumatic), right eye: Secondary | ICD-10-CM | POA: Diagnosis not present

## 2023-05-02 DIAGNOSIS — E113511 Type 2 diabetes mellitus with proliferative diabetic retinopathy with macular edema, right eye: Secondary | ICD-10-CM | POA: Diagnosis not present

## 2023-05-02 DIAGNOSIS — H4311 Vitreous hemorrhage, right eye: Secondary | ICD-10-CM | POA: Diagnosis not present

## 2023-05-02 DIAGNOSIS — Z9889 Other specified postprocedural states: Secondary | ICD-10-CM | POA: Diagnosis not present

## 2023-05-02 LAB — HM DIABETES EYE EXAM

## 2023-05-03 DIAGNOSIS — Z8546 Personal history of malignant neoplasm of prostate: Secondary | ICD-10-CM | POA: Diagnosis not present

## 2023-05-10 DIAGNOSIS — N453 Epididymo-orchitis: Secondary | ICD-10-CM | POA: Diagnosis not present

## 2023-05-10 DIAGNOSIS — R3915 Urgency of urination: Secondary | ICD-10-CM | POA: Diagnosis not present

## 2023-05-10 DIAGNOSIS — N401 Enlarged prostate with lower urinary tract symptoms: Secondary | ICD-10-CM | POA: Diagnosis not present

## 2023-05-10 DIAGNOSIS — Z8546 Personal history of malignant neoplasm of prostate: Secondary | ICD-10-CM | POA: Diagnosis not present

## 2023-05-15 DIAGNOSIS — H25042 Posterior subcapsular polar age-related cataract, left eye: Secondary | ICD-10-CM | POA: Diagnosis not present

## 2023-05-15 DIAGNOSIS — H25012 Cortical age-related cataract, left eye: Secondary | ICD-10-CM | POA: Diagnosis not present

## 2023-05-15 DIAGNOSIS — H2512 Age-related nuclear cataract, left eye: Secondary | ICD-10-CM | POA: Diagnosis not present

## 2023-05-16 ENCOUNTER — Other Ambulatory Visit: Payer: Self-pay | Admitting: Internal Medicine

## 2023-05-16 ENCOUNTER — Encounter: Payer: Self-pay | Admitting: Internal Medicine

## 2023-05-16 DIAGNOSIS — E139 Other specified diabetes mellitus without complications: Secondary | ICD-10-CM

## 2023-05-16 DIAGNOSIS — I10 Essential (primary) hypertension: Secondary | ICD-10-CM

## 2023-05-24 DIAGNOSIS — H4311 Vitreous hemorrhage, right eye: Secondary | ICD-10-CM | POA: Diagnosis not present

## 2023-05-24 DIAGNOSIS — Z9889 Other specified postprocedural states: Secondary | ICD-10-CM | POA: Diagnosis not present

## 2023-05-24 DIAGNOSIS — E113511 Type 2 diabetes mellitus with proliferative diabetic retinopathy with macular edema, right eye: Secondary | ICD-10-CM | POA: Diagnosis not present

## 2023-05-30 DIAGNOSIS — K136 Irritative hyperplasia of oral mucosa: Secondary | ICD-10-CM | POA: Diagnosis not present

## 2023-05-30 DIAGNOSIS — D1039 Benign neoplasm of other parts of mouth: Secondary | ICD-10-CM | POA: Diagnosis not present

## 2023-06-05 ENCOUNTER — Ambulatory Visit (HOSPITAL_BASED_OUTPATIENT_CLINIC_OR_DEPARTMENT_OTHER)
Admission: RE | Admit: 2023-06-05 | Discharge: 2023-06-05 | Disposition: A | Discharge status: Home / Self Care | Payer: Medicare Other | Source: Ambulatory Visit | Attending: Internal Medicine | Admitting: Internal Medicine

## 2023-06-05 DIAGNOSIS — F17210 Nicotine dependence, cigarettes, uncomplicated: Secondary | ICD-10-CM | POA: Insufficient documentation

## 2023-06-05 DIAGNOSIS — Z87891 Personal history of nicotine dependence: Secondary | ICD-10-CM | POA: Diagnosis not present

## 2023-06-05 DIAGNOSIS — Z122 Encounter for screening for malignant neoplasm of respiratory organs: Secondary | ICD-10-CM | POA: Insufficient documentation

## 2023-06-07 ENCOUNTER — Encounter: Payer: Self-pay | Admitting: Internal Medicine

## 2023-06-07 ENCOUNTER — Ambulatory Visit (INDEPENDENT_AMBULATORY_CARE_PROVIDER_SITE_OTHER): Payer: Medicare Other | Admitting: Internal Medicine

## 2023-06-07 VITALS — BP 132/78 | HR 89 | Temp 98.4°F | Resp 16 | Ht 73.0 in | Wt 255.2 lb

## 2023-06-07 DIAGNOSIS — Z794 Long term (current) use of insulin: Secondary | ICD-10-CM | POA: Diagnosis not present

## 2023-06-07 DIAGNOSIS — E113299 Type 2 diabetes mellitus with mild nonproliferative diabetic retinopathy without macular edema, unspecified eye: Secondary | ICD-10-CM

## 2023-06-07 DIAGNOSIS — E139 Other specified diabetes mellitus without complications: Secondary | ICD-10-CM

## 2023-06-07 DIAGNOSIS — I10 Essential (primary) hypertension: Secondary | ICD-10-CM

## 2023-06-07 DIAGNOSIS — D508 Other iron deficiency anemias: Secondary | ICD-10-CM | POA: Diagnosis not present

## 2023-06-07 DIAGNOSIS — Z23 Encounter for immunization: Secondary | ICD-10-CM | POA: Diagnosis not present

## 2023-06-07 DIAGNOSIS — E785 Hyperlipidemia, unspecified: Secondary | ICD-10-CM

## 2023-06-07 DIAGNOSIS — N1831 Chronic kidney disease, stage 3a: Secondary | ICD-10-CM

## 2023-06-07 DIAGNOSIS — R9431 Abnormal electrocardiogram [ECG] [EKG]: Secondary | ICD-10-CM

## 2023-06-07 LAB — CBC WITH DIFFERENTIAL/PLATELET
Basophils Absolute: 0.1 10*3/uL (ref 0.0–0.1)
Basophils Relative: 0.6 % (ref 0.0–3.0)
Eosinophils Absolute: 0.4 10*3/uL (ref 0.0–0.7)
Eosinophils Relative: 4 % (ref 0.0–5.0)
HCT: 41.8 % (ref 39.0–52.0)
Hemoglobin: 13.6 g/dL (ref 13.0–17.0)
Lymphocytes Relative: 30.7 % (ref 12.0–46.0)
Lymphs Abs: 3 10*3/uL (ref 0.7–4.0)
MCHC: 32.5 g/dL (ref 30.0–36.0)
MCV: 90.3 fL (ref 78.0–100.0)
Monocytes Absolute: 0.7 10*3/uL (ref 0.1–1.0)
Monocytes Relative: 7.2 % (ref 3.0–12.0)
Neutro Abs: 5.6 10*3/uL (ref 1.4–7.7)
Neutrophils Relative %: 57.5 % (ref 43.0–77.0)
Platelets: 191 10*3/uL (ref 150.0–400.0)
RBC: 4.63 Mil/uL (ref 4.22–5.81)
RDW: 16.1 % — ABNORMAL HIGH (ref 11.5–15.5)
WBC: 9.7 10*3/uL (ref 4.0–10.5)

## 2023-06-07 LAB — BASIC METABOLIC PANEL
BUN: 13 mg/dL (ref 6–23)
CO2: 29 meq/L (ref 19–32)
Calcium: 8.7 mg/dL (ref 8.4–10.5)
Chloride: 100 meq/L (ref 96–112)
Creatinine, Ser: 0.99 mg/dL (ref 0.40–1.50)
GFR: 77.05 mL/min (ref >60.00)
Glucose, Bld: 115 mg/dL — ABNORMAL HIGH (ref 70–99)
Potassium: 4.3 meq/L (ref 3.5–5.1)
Sodium: 134 meq/L — ABNORMAL LOW (ref 135–145)

## 2023-06-07 LAB — URINALYSIS, ROUTINE W REFLEX MICROSCOPIC
Bilirubin Urine: NEGATIVE
Hgb urine dipstick: NEGATIVE
Ketones, ur: NEGATIVE
Leukocytes,Ua: NEGATIVE
Nitrite: NEGATIVE
Specific Gravity, Urine: 1.015 (ref 1.000–1.030)
Total Protein, Urine: NEGATIVE
Urine Glucose: NEGATIVE
Urobilinogen, UA: 0.2 (ref 0.0–1.0)
pH: 6.5 (ref 5.0–8.0)

## 2023-06-07 LAB — IBC + FERRITIN
Ferritin: 101.7 ng/mL (ref 22.0–322.0)
Iron: 56 ug/dL (ref 42–165)
Saturation Ratios: 20.5 % (ref 20.0–50.0)
TIBC: 273 ug/dL (ref 250.0–450.0)
Transferrin: 195 mg/dL — ABNORMAL LOW (ref 212.0–360.0)

## 2023-06-07 LAB — HEPATIC FUNCTION PANEL
ALT: 14 U/L (ref 0–53)
AST: 16 U/L (ref 0–37)
Albumin: 3.8 g/dL (ref 3.5–5.2)
Alkaline Phosphatase: 104 U/L (ref 39–117)
Bilirubin, Direct: 0.1 mg/dL (ref 0.0–0.3)
Total Bilirubin: 0.4 mg/dL (ref 0.2–1.2)
Total Protein: 6.7 g/dL (ref 6.0–8.3)

## 2023-06-07 LAB — MICROALBUMIN / CREATININE URINE RATIO
Creatinine,U: 126.6 mg/dL
Microalb Creat Ratio: 0.8 mg/g (ref 0.0–30.0)
Microalb, Ur: 1 mg/dL (ref 0.0–1.9)

## 2023-06-07 NOTE — Patient Instructions (Signed)
Hypertension, Adult High blood pressure (hypertension) is when the force of blood pumping through the arteries is too strong. The arteries are the blood vessels that carry blood from the heart throughout the body. Hypertension forces the heart to work harder to pump blood and may cause arteries to become narrow or stiff. Untreated or uncontrolled hypertension can lead to a heart attack, heart failure, a stroke, kidney disease, and other problems. A blood pressure reading consists of a higher number over a lower number. Ideally, your blood pressure should be below 120/80. The first ("top") number is called the systolic pressure. It is a measure of the pressure in your arteries as your heart beats. The second ("bottom") number is called the diastolic pressure. It is a measure of the pressure in your arteries as the heart relaxes. What are the causes? The exact cause of this condition is not known. There are some conditions that result in high blood pressure. What increases the risk? Certain factors may make you more likely to develop high blood pressure. Some of these risk factors are under your control, including: Smoking. Not getting enough exercise or physical activity. Being overweight. Having too much fat, sugar, calories, or salt (sodium) in your diet. Drinking too much alcohol. Other risk factors include: Having a personal history of heart disease, diabetes, high cholesterol, or kidney disease. Stress. Having a family history of high blood pressure and high cholesterol. Having obstructive sleep apnea. Age. The risk increases with age. What are the signs or symptoms? High blood pressure may not cause symptoms. Very high blood pressure (hypertensive crisis) may cause: Headache. Fast or irregular heartbeats (palpitations). Shortness of breath. Nosebleed. Nausea and vomiting. Vision changes. Severe chest pain, dizziness, and seizures. How is this diagnosed? This condition is diagnosed by  measuring your blood pressure while you are seated, with your arm resting on a flat surface, your legs uncrossed, and your feet flat on the floor. The cuff of the blood pressure monitor will be placed directly against the skin of your upper arm at the level of your heart. Blood pressure should be measured at least twice using the same arm. Certain conditions can cause a difference in blood pressure between your right and left arms. If you have a high blood pressure reading during one visit or you have normal blood pressure with other risk factors, you may be asked to: Return on a different day to have your blood pressure checked again. Monitor your blood pressure at home for 1 week or longer. If you are diagnosed with hypertension, you may have other blood or imaging tests to help your health care provider understand your overall risk for other conditions. How is this treated? This condition is treated by making healthy lifestyle changes, such as eating healthy foods, exercising more, and reducing your alcohol intake. You may be referred for counseling on a healthy diet and physical activity. Your health care provider may prescribe medicine if lifestyle changes are not enough to get your blood pressure under control and if: Your systolic blood pressure is above 130. Your diastolic blood pressure is above 80. Your personal target blood pressure may vary depending on your medical conditions, your age, and other factors. Follow these instructions at home: Eating and drinking  Eat a diet that is high in fiber and potassium, and low in sodium, added sugar, and fat. An example of this eating plan is called the DASH diet. DASH stands for Dietary Approaches to Stop Hypertension. To eat this way: Eat   plenty of fresh fruits and vegetables. Try to fill one half of your plate at each meal with fruits and vegetables. Eat whole grains, such as whole-wheat pasta, brown rice, or whole-grain bread. Fill about one  fourth of your plate with whole grains. Eat or drink low-fat dairy products, such as skim milk or low-fat yogurt. Avoid fatty cuts of meat, processed or cured meats, and poultry with skin. Fill about one fourth of your plate with lean proteins, such as fish, chicken without skin, beans, eggs, or tofu. Avoid pre-made and processed foods. These tend to be higher in sodium, added sugar, and fat. Reduce your daily sodium intake. Many people with hypertension should eat less than 1,500 mg of sodium a day. Do not drink alcohol if: Your health care provider tells you not to drink. You are pregnant, may be pregnant, or are planning to become pregnant. If you drink alcohol: Limit how much you have to: 0-1 drink a day for women. 0-2 drinks a day for men. Know how much alcohol is in your drink. In the [place] one drink equals one 12 oz bottle of beer (355 mL), one 5 oz glass of wine (148 mL), or one 1 oz glass of hard liquor (44 mL). Lifestyle  Work with your health care provider to maintain a healthy body weight or to lose weight. Ask what an ideal weight is for you. Get at least 30 minutes of exercise that causes your heart to beat faster (aerobic exercise) most days of the week. Activities may include walking, swimming, or biking. Include exercise to strengthen your muscles (resistance exercise), such as Pilates or lifting weights, as part of your weekly exercise routine. Try to do these types of exercises for 30 minutes at least 3 days a week. Do not use any products that contain nicotine or tobacco. These products include cigarettes, chewing tobacco, and vaping devices, such as e-cigarettes. If you need help quitting, ask your health care provider. Monitor your blood pressure at home as told by your health care provider. Keep all follow-up visits. This is important. Medicines Take over-the-counter and prescription medicines only as told by your health care provider. Follow directions carefully. Blood  pressure medicines must be taken as prescribed. Do not skip doses of blood pressure medicine. Doing this puts you at risk for problems and can make the medicine less effective. Ask your health care provider about side effects or reactions to medicines that you should watch for. Contact a health care provider if you: Think you are having a reaction to a medicine you are taking. Have headaches that keep coming back (recurring). Feel dizzy. Have swelling in your ankles. Have trouble with your vision. Get help right away if you: Develop a severe headache or confusion. Have unusual weakness or numbness. Feel faint. Have severe pain in your chest or abdomen. Vomit repeatedly. Have trouble breathing. These symptoms may be an emergency. Get help right away. Call 911. Do not wait to see if the symptoms will go away. Do not drive yourself to the hospital. Summary Hypertension is when the force of blood pumping through your arteries is too strong. If this condition is not controlled, it may put you at risk for serious complications. Your personal target blood pressure may vary depending on your medical conditions, your age, and other factors. For most people, a normal blood pressure is less than 120/80. Hypertension is treated with lifestyle changes, medicines, or a combination of both. Lifestyle changes include losing weight, eating a healthy,   low-sodium diet, exercising more, and limiting alcohol. This information is not intended to replace advice given to you by your health care provider. Make sure you discuss any questions you have with your health care provider. Document Revised: 05/11/2021 Document Reviewed: 05/11/2021 Elsevier Patient Education  2024 [employer]  

## 2023-06-07 NOTE — Progress Notes (Unsigned)
Subjective:  Patient ID: Anthony Simmonds., male    DOB: [birth year]  Age: 72 y.o. MRN: [id number]  CC: Hypertension, Hyperlipidemia, and Diabetes   HPI Anthony Daniel. presents for f/up ----  Discussed the use of AI scribe software for clinical note transcription with the patient, who gave verbal consent to proceed.  History of Present Illness   The patient, with a history of diabetes and prostate cancer treated with seed implantation, presents with concerns about recent urinary symptoms and testicular pain. He reports a period of difficulty urinating and bladder control issues, along with sensitivity in the anal region, which he attributes to side effects from the seed implantation. These symptoms have since resolved with antibiotic treatment.  He also experienced epididymitis, presenting as painful testicles, which has been managed with antibiotics. The patient notes a significant reduction in testicular swelling and pain, but suspects another episode may be developing.  The patient also mentions a recent cataract surgery in October, during which some leaking blood vessels in the right eye were sealed off. He has been taking iron supplements for anemia, but is unsure of his current iron levels. He denies any blood loss or symptoms of anemia such as weakness or dizziness.       Outpatient Medications Prior to Visit  Medication Sig Dispense Refill   allopurinol (ZYLOPRIM) 100 MG tablet TAKE 1 TABLET BY MOUTH EVERY DAY 90 tablet 1   aspirin EC 81 MG tablet Take 81 mg by mouth in the morning.     candesartan (ATACAND) 16 MG tablet TAKE 1 TABLET BY MOUTH EVERY DAY 90 tablet 0   Ciclopirox 0.77 % gel APPLY TO AFFECTED AREA TWICE A DAY 30 g 2   Continuous Blood Gluc Receiver (DEXCOM G6 RECEIVER) DEVI See admin instructions.     Continuous Blood Gluc Sensor (DEXCOM G6 SENSOR) MISC change sensor     Continuous Blood Gluc Transmit (DEXCOM G6 TRANSMITTER) MISC See admin instructions.      ezetimibe (ZETIA) 10 MG tablet TAKE 1 TABLET BY MOUTH EVERY DAY 90 tablet 1   Ferric Maltol (ACCRUFER) 30 MG CAPS Take 1 capsule (30 mg total) by mouth in the morning and at bedtime. 180 capsule 0   glucose blood (ONETOUCH VERIO) test strip 5X A DAY     GVOKE HYPOPEN 2-PACK 1 MG/0.2ML SOAJ INJECT 1 ACT INTO THE SKIN DAILY AS NEEDED (HYPOGLYCEMIA). 0.2 mL 3   indapamide (LOZOL) 1.25 MG tablet TAKE 1 TABLET BY MOUTH EVERY DAY 90 tablet 0   insulin aspart (NOVOLOG) 100 UNIT/ML injection Inject into the skin as directed. USE VIA INSULIN PUMP TO ADMINISTER UP TO 130 UNITS MAXIUM DAILY     Insulin Glargine (LANTUS SOLOSTAR [place]) Inject into the skin 2 (two) times daily. 25 UNITS IN AM AND  20--25 UNITS;  WHEN FASTING OR UNABLE TO EAT LOWER TO 18 UNITS TWICE DAILY     Insulin Human (INSULIN PUMP) SOLN Inject 1 each into the skin continuous. Novolog insulin     levocetirizine (XYZAL) 5 MG tablet TAKE 1 TABLET BY MOUTH DAILY AS NEEDED FOR ALLERGIES. 90 tablet 1   mesalamine (CANASA) 1000 MG suppository Place 1,000 mg rectally at bedtime.     neomycin-polymyxin-hydrocortisone (CORTISPORIN) 3.5-10000-1 OTIC suspension Place 3 drops into both ears 3 (three) times daily. 10 mL 2   nicotine (NICODERM CQ - DOSED IN MG/24 HOURS) 14 mg/24hr patch PLACE 1 PATCH ONTO THE SKIN DAILY. 28 patch 0   NOVOLOG FLEXPEN  100 UNIT/ML FlexPen Inject 12-15 Units into the skin 3 (three) times daily before meals. WHEN NOT USING INSULIN PUMP ;  WHEN FASTING OR UNABLE TO EAT ONLY WHEN NEEDED TO TREAT BLOOD SUGAR >200     nystatin (MYCOSTATIN/NYSTOP) powder Apply 1 Application topically.     omeprazole (PRILOSEC) 20 MG capsule TAKE 1 CAPSULE BY MOUTH EVERY DAY 90 capsule 0   Polyethyl Glycol-Propyl Glycol (SYSTANE OP) Place 1 drop into both eyes 3 (three) times daily as needed (dry/irritated eyes).     potassium chloride SA (KLOR-CON M20) 20 MEQ tablet Take 20 mEq by mouth 3 (three) times daily.     Potassium Citrate 15 MEQ (1620 MG)  TBCR Take 1 tablet by mouth in the morning and at bedtime.     Propylene Glycol (SYSTANE COMPLETE) 0.6 % SOLN Apply to eye.     SUTAB [phone] MG TABS See admin instructions.     tamsulosin (FLOMAX) 0.4 MG CAPS capsule Take 0.4 mg by mouth in the morning.     TRESIBA FLEXTOUCH 100 UNIT/ML FlexTouch Pen Inject into the skin.     No facility-administered medications prior to visit.    ROS Review of Systems  Objective:  BP 132/78 (BP Location: Left Arm, Patient Position: Sitting, Cuff Size: Large)   Pulse 89   Temp 98.4 F (36.9 C) (Oral)   Resp 16   Ht 6\' 1"  (1.854 m)   Wt 255 lb 3.2 oz (115.8 kg)   SpO2 94%   BMI 33.67 kg/m   BP Readings from Last 3 Encounters:  06/07/23 132/78  12/15/22 126/68  11/20/22 117/74    Wt Readings from Last 3 Encounters:  06/07/23 255 lb 3.2 oz (115.8 kg)  12/15/22 255 lb (115.7 kg)  11/20/22 260 lb (117.9 kg)    Physical Exam Vitals reviewed.  Constitutional:      Appearance: Normal appearance.  HENT:     Mouth/Throat:     Mouth: Mucous membranes are moist.  Eyes:     General: No scleral icterus.    Conjunctiva/sclera: Conjunctivae normal.  Cardiovascular:     Rate and Rhythm: Normal rate and regular rhythm.     Heart sounds: No murmur heard.    No friction rub. No gallop.     Comments: EKG- NSR, 69 bpm LAD Anterior infarct pattern Unchanged Pulmonary:     Effort: Pulmonary effort is normal.     Breath sounds: No stridor. No wheezing, rhonchi or rales.  Abdominal:     General: Abdomen is flat.     Palpations: There is no mass.     Tenderness: There is no abdominal tenderness. There is no guarding.     Hernia: No hernia is present.  Musculoskeletal:        General: Normal range of motion.     Cervical back: Neck supple.     Right lower leg: No edema.     Left lower leg: No edema.  Lymphadenopathy:     Cervical: No cervical adenopathy.  Skin:    General: Skin is warm and dry.     Coloration: Skin is not pale.   Neurological:     General: No focal deficit present.     Mental Status: He is alert. Mental status is at baseline.  Psychiatric:        Mood and Affect: Mood normal.        Behavior: Behavior normal.     Lab Results  Component Value Date   WBC 14.5 (  H) 12/15/2022   HGB 13.4 12/15/2022   HCT 41.7 12/15/2022   PLT 261.0 12/15/2022   GLUCOSE 159 (H) 12/15/2022   CHOL 125 09/21/2022   TRIG 84 09/21/2022   HDL 37 (L) 09/21/2022   LDLCALC 71 09/21/2022   ALT 12 09/20/2022   AST 11 (A) 09/20/2022   NA 137 12/15/2022   K 4.2 12/15/2022   CL 101 12/15/2022   CREATININE 1.10 12/15/2022   BUN 15 12/15/2022   CO2 28 12/15/2022   TSH 0.70 09/20/2022   PSA 0.2 09/20/2022   INR 0.9 02/05/2021   HGBA1C 7.8 04/05/2023   MICROALBUR 23.1 (H) 12/15/2022    No results found.  Assessment & Plan:  Need for immunization against influenza -     Flu Vaccine Trivalent High Dose (Fluad)  Essential hypertension -     EKG 12-Lead -     Urinalysis, Routine w reflex microscopic; Future -     Basic metabolic panel; Future -     Hepatic function panel; Future  Chronic kidney disease, stage 3a (HCC) -     Microalbumin / creatinine urine ratio; Future -     Basic metabolic panel; Future  Background diabetic retinopathy (HCC)  Abnormal electrocardiogram  Iron deficiency anemia secondary to inadequate dietary iron intake -     IBC + Ferritin; Future -     CBC with Differential/Platelet; Future  LADA (latent autoimmune diabetes in adults), managed as type 1 (HCC) -     Microalbumin / creatinine urine ratio; Future -     Urinalysis, Routine w reflex microscopic; Future -     HM Diabetes Foot Exam  Hyperlipidemia with target LDL less than 100 -     Hepatic function panel; Future     Follow-up: Return in about 6 months (around 12/05/2023).  Sanda Linger, MD

## 2023-06-08 ENCOUNTER — Other Ambulatory Visit: Payer: Self-pay | Admitting: Internal Medicine

## 2023-06-08 DIAGNOSIS — N492 Inflammatory disorders of scrotum: Secondary | ICD-10-CM | POA: Diagnosis not present

## 2023-06-08 DIAGNOSIS — Z23 Encounter for immunization: Secondary | ICD-10-CM | POA: Insufficient documentation

## 2023-06-10 ENCOUNTER — Encounter: Payer: Self-pay | Admitting: Internal Medicine

## 2023-06-12 ENCOUNTER — Other Ambulatory Visit: Payer: Self-pay

## 2023-06-12 DIAGNOSIS — I10 Essential (primary) hypertension: Secondary | ICD-10-CM

## 2023-06-12 DIAGNOSIS — E139 Other specified diabetes mellitus without complications: Secondary | ICD-10-CM

## 2023-06-12 MED ORDER — CANDESARTAN CILEXETIL 16 MG PO TABS: 16.0000 mg | ORAL_TABLET | Freq: Every day | ORAL | 1 refills | Status: DC

## 2023-06-13 ENCOUNTER — Ambulatory Visit: Payer: Medicare Other | Admitting: Internal Medicine

## 2023-06-26 DIAGNOSIS — N492 Inflammatory disorders of scrotum: Secondary | ICD-10-CM | POA: Diagnosis not present

## 2023-06-26 DIAGNOSIS — N453 Epididymo-orchitis: Secondary | ICD-10-CM | POA: Diagnosis not present

## 2023-06-28 ENCOUNTER — Other Ambulatory Visit: Payer: Self-pay | Admitting: Acute Care

## 2023-06-28 DIAGNOSIS — F17210 Nicotine dependence, cigarettes, uncomplicated: Secondary | ICD-10-CM

## 2023-06-28 DIAGNOSIS — Z87891 Personal history of nicotine dependence: Secondary | ICD-10-CM

## 2023-06-28 DIAGNOSIS — Z122 Encounter for screening for malignant neoplasm of respiratory organs: Secondary | ICD-10-CM

## 2023-07-04 DIAGNOSIS — Z720 Tobacco use: Secondary | ICD-10-CM | POA: Diagnosis not present

## 2023-07-04 DIAGNOSIS — N182 Chronic kidney disease, stage 2 (mild): Secondary | ICD-10-CM | POA: Diagnosis not present

## 2023-07-04 DIAGNOSIS — E669 Obesity, unspecified: Secondary | ICD-10-CM | POA: Diagnosis not present

## 2023-07-04 DIAGNOSIS — E785 Hyperlipidemia, unspecified: Secondary | ICD-10-CM | POA: Diagnosis not present

## 2023-07-04 DIAGNOSIS — Z794 Long term (current) use of insulin: Secondary | ICD-10-CM | POA: Diagnosis not present

## 2023-07-04 DIAGNOSIS — Z87442 Personal history of urinary calculi: Secondary | ICD-10-CM | POA: Diagnosis not present

## 2023-07-04 DIAGNOSIS — E1022 Type 1 diabetes mellitus with diabetic chronic kidney disease: Secondary | ICD-10-CM | POA: Diagnosis not present

## 2023-07-20 ENCOUNTER — Other Ambulatory Visit: Payer: Self-pay | Admitting: Internal Medicine

## 2023-07-20 DIAGNOSIS — I10 Essential (primary) hypertension: Secondary | ICD-10-CM

## 2023-07-28 ENCOUNTER — Other Ambulatory Visit: Payer: Self-pay | Admitting: Internal Medicine

## 2023-07-28 DIAGNOSIS — J301 Allergic rhinitis due to pollen: Secondary | ICD-10-CM

## 2023-07-31 DIAGNOSIS — N200 Calculus of kidney: Secondary | ICD-10-CM | POA: Diagnosis not present

## 2023-07-31 DIAGNOSIS — Z6832 Body mass index (BMI) 32.0-32.9, adult: Secondary | ICD-10-CM | POA: Diagnosis not present

## 2023-07-31 DIAGNOSIS — N1831 Chronic kidney disease, stage 3a: Secondary | ICD-10-CM | POA: Diagnosis not present

## 2023-07-31 DIAGNOSIS — E66811 Obesity, class 1: Secondary | ICD-10-CM | POA: Diagnosis not present

## 2023-08-11 ENCOUNTER — Other Ambulatory Visit: Payer: Self-pay | Admitting: Internal Medicine

## 2023-08-11 DIAGNOSIS — R82998 Other abnormal findings in urine: Secondary | ICD-10-CM

## 2023-08-11 DIAGNOSIS — N289 Disorder of kidney and ureter, unspecified: Secondary | ICD-10-CM

## 2023-08-18 DIAGNOSIS — R3912 Poor urinary stream: Secondary | ICD-10-CM | POA: Diagnosis not present

## 2023-08-18 DIAGNOSIS — Z8546 Personal history of malignant neoplasm of prostate: Secondary | ICD-10-CM | POA: Diagnosis not present

## 2023-08-18 DIAGNOSIS — N5201 Erectile dysfunction due to arterial insufficiency: Secondary | ICD-10-CM | POA: Diagnosis not present

## 2023-08-18 DIAGNOSIS — N492 Inflammatory disorders of scrotum: Secondary | ICD-10-CM | POA: Diagnosis not present

## 2023-08-18 DIAGNOSIS — N401 Enlarged prostate with lower urinary tract symptoms: Secondary | ICD-10-CM | POA: Diagnosis not present

## 2023-08-22 DIAGNOSIS — R82991 Hypocitraturia: Secondary | ICD-10-CM | POA: Diagnosis not present

## 2023-08-22 DIAGNOSIS — M1030 Gout due to renal impairment, unspecified site: Secondary | ICD-10-CM | POA: Diagnosis not present

## 2023-08-22 DIAGNOSIS — R82992 Hyperoxaluria: Secondary | ICD-10-CM | POA: Diagnosis not present

## 2023-08-22 DIAGNOSIS — N289 Disorder of kidney and ureter, unspecified: Secondary | ICD-10-CM | POA: Diagnosis not present

## 2023-08-22 DIAGNOSIS — N200 Calculus of kidney: Secondary | ICD-10-CM | POA: Diagnosis not present

## 2023-09-01 ENCOUNTER — Encounter: Payer: Self-pay | Admitting: Internal Medicine

## 2023-09-05 ENCOUNTER — Ambulatory Visit (INDEPENDENT_AMBULATORY_CARE_PROVIDER_SITE_OTHER): Payer: Medicare Other | Admitting: Internal Medicine

## 2023-09-05 ENCOUNTER — Ambulatory Visit: Payer: Medicare Other | Admitting: Internal Medicine

## 2023-09-05 ENCOUNTER — Encounter: Payer: Self-pay | Admitting: Internal Medicine

## 2023-09-05 VITALS — BP 120/72 | HR 91 | Temp 97.8°F | Ht 73.0 in | Wt 252.0 lb

## 2023-09-05 DIAGNOSIS — C61 Malignant neoplasm of prostate: Secondary | ICD-10-CM | POA: Diagnosis not present

## 2023-09-05 DIAGNOSIS — E139 Other specified diabetes mellitus without complications: Secondary | ICD-10-CM | POA: Diagnosis not present

## 2023-09-05 DIAGNOSIS — I10 Essential (primary) hypertension: Secondary | ICD-10-CM | POA: Diagnosis not present

## 2023-09-05 DIAGNOSIS — D508 Other iron deficiency anemias: Secondary | ICD-10-CM

## 2023-09-05 DIAGNOSIS — E785 Hyperlipidemia, unspecified: Secondary | ICD-10-CM

## 2023-09-05 DIAGNOSIS — R5382 Chronic fatigue, unspecified: Secondary | ICD-10-CM | POA: Diagnosis not present

## 2023-09-05 LAB — CBC WITH DIFFERENTIAL/PLATELET
Basophils Absolute: 0.1 10*3/uL (ref 0.0–0.1)
Basophils Relative: 0.8 % (ref 0.0–3.0)
Eosinophils Absolute: 0.6 10*3/uL (ref 0.0–0.7)
Eosinophils Relative: 4.9 % (ref 0.0–5.0)
HCT: 42.3 % (ref 39.0–52.0)
Hemoglobin: 13.9 g/dL (ref 13.0–17.0)
Lymphocytes Relative: 32.6 % (ref 12.0–46.0)
Lymphs Abs: 3.7 10*3/uL (ref 0.7–4.0)
MCHC: 32.8 g/dL (ref 30.0–36.0)
MCV: 92.6 fL (ref 78.0–100.0)
Monocytes Absolute: 0.8 10*3/uL (ref 0.1–1.0)
Monocytes Relative: 7.2 % (ref 3.0–12.0)
Neutro Abs: 6.2 10*3/uL (ref 1.4–7.7)
Neutrophils Relative %: 54.5 % (ref 43.0–77.0)
Platelets: 187 10*3/uL (ref 150.0–400.0)
RBC: 4.57 Mil/uL (ref 4.22–5.81)
RDW: 16.2 % — ABNORMAL HIGH (ref 11.5–15.5)
WBC: 11.4 10*3/uL — ABNORMAL HIGH (ref 4.0–10.5)

## 2023-09-05 LAB — BASIC METABOLIC PANEL
BUN: 19 mg/dL (ref 6–23)
CO2: 27 meq/L (ref 19–32)
Calcium: 8.7 mg/dL (ref 8.4–10.5)
Chloride: 101 meq/L (ref 96–112)
Creatinine, Ser: 1.11 mg/dL (ref 0.40–1.50)
GFR: 67.05 mL/min (ref >60.00)
Glucose, Bld: 195 mg/dL — ABNORMAL HIGH (ref 70–99)
Potassium: 4.2 meq/L (ref 3.5–5.1)
Sodium: 136 meq/L (ref 135–145)

## 2023-09-05 LAB — TSH: TSH: 1.43 u[IU]/mL (ref 0.35–5.50)

## 2023-09-05 LAB — IBC + FERRITIN
Ferritin: 82.8 ng/mL (ref 22.0–322.0)
Iron: 72 ug/dL (ref 42–165)
Saturation Ratios: 23.2 % (ref 20.0–50.0)
TIBC: 310.8 ug/dL (ref 250.0–450.0)
Transferrin: 222 mg/dL (ref 212.0–360.0)

## 2023-09-05 LAB — HEMOGLOBIN A1C: Hgb A1c MFr Bld: 7.7 % — ABNORMAL HIGH (ref 4.6–6.5)

## 2023-09-05 LAB — CORTISOL: Cortisol, Plasma: 8.3 ug/dL

## 2023-09-05 LAB — PSA: PSA: 0.05 ng/mL — ABNORMAL LOW (ref 0.10–4.00)

## 2023-09-05 NOTE — Progress Notes (Unsigned)
Subjective:  [name]., male    DOB: Aug 02, 1950  Age: 73 y.o. MRN: [id number]  CC: Hypertension and Hyperlipidemia   HPI Anthony Daniel. presents for f/up ------  Discussed the use of AI scribe software for clinical note transcription with the patient, who gave verbal consent to proceed.  History of Present Illness   Anthony Daniel. "Anthony Daniel" is a 73 year old male who presents with fatigue and concerns of anemia.  He has been experiencing increased fatigue, sleeping more than usual, and a decrease in energy levels, which his wife also observed. After eating, regardless of the meal, he tends to lay down and sleep, which is a change from his normal routine. He previously felt more energetic when taking iron supplements, which he is not currently taking. No shortness of breath, chest pain, weakness, dizziness, or lightheadedness.  His blood pressure readings are sometimes low, with occasional measurements in the sixties. He is currently taking indapamide. He reports sleeping well without issues of sleep apnea, although he does snore. He generally feels refreshed after sleep, provided his blood sugar is within a good range.       Outpatient Medications Prior to Visit  Medication Sig Dispense Refill   allopurinol (ZYLOPRIM) 100 MG tablet TAKE 1 TABLET BY MOUTH EVERY DAY 90 tablet 1   aspirin EC 81 MG tablet Take 81 mg by mouth in the morning.     candesartan (ATACAND) 16 MG tablet Take 1 tablet (16 mg total) by mouth daily. 90 tablet 1   Ciclopirox 0.77 % gel APPLY TO AFFECTED AREA TWICE A DAY 30 g 2   Continuous Blood Gluc Receiver (DEXCOM G6 RECEIVER) DEVI See admin instructions.     Continuous Blood Gluc Sensor (DEXCOM G6 SENSOR) MISC change sensor     Continuous Blood Gluc Transmit (DEXCOM G6 TRANSMITTER) MISC See admin instructions.     ezetimibe (ZETIA) 10 MG tablet TAKE 1 TABLET BY MOUTH EVERY DAY 90 tablet 1   Ferric Maltol (ACCRUFER) 30 MG CAPS Take 1  capsule (30 mg total) by mouth in the morning and at bedtime. 180 capsule 0   glucose blood (ONETOUCH VERIO) test strip 5X A DAY     GVOKE HYPOPEN 2-PACK 1 MG/0.2ML SOAJ INJECT 1 ACT INTO THE SKIN DAILY AS NEEDED (HYPOGLYCEMIA). 0.2 mL 3   insulin aspart (NOVOLOG) 100 UNIT/ML injection Inject into the skin as directed. USE VIA INSULIN PUMP TO ADMINISTER UP TO 130 UNITS MAXIUM DAILY     Insulin Glargine (LANTUS SOLOSTAR [place]) Inject into the skin 2 (two) times daily. 25 UNITS IN AM AND  20--25 UNITS;  WHEN FASTING OR UNABLE TO EAT LOWER TO 18 UNITS TWICE DAILY     Insulin Human (INSULIN PUMP) SOLN Inject 1 each into the skin continuous. Novolog insulin     levocetirizine (XYZAL) 5 MG tablet TAKE 1 TABLET BY MOUTH DAILY AS NEEDED FOR ALLERGIES. 90 tablet 1   mesalamine (CANA[place]) 1000 MG suppository Place 1,000 mg rectally at bedtime.     neomycin-polymyxin-hydrocortisone (CORTISPORIN) 3.5-10000-1 OTIC suspension Place 3 drops into both ears 3 (three) times daily. 10 mL 2   nicotine (NICODERM CQ - DOSED IN MG/24 HOURS) 14 mg/24hr patch PLACE 1 PATCH ONTO THE SKIN DAILY. 28 patch 0   NOVOLOG FLEXPEN 100 UNIT/ML FlexPen Inject 12-15 Units into the skin 3 (three) times daily before meals. WHEN NOT USING INSULIN PUMP ;  WHEN FASTING OR UNABLE TO EAT ONLY  WHEN NEEDED TO TREAT BLOOD SUGAR >200     nystatin (MYCOSTATIN/NYSTOP) powder Apply 1 Application topically.     omeprazole (PRILOSEC) 20 MG capsule TAKE 1 CAPSULE BY MOUTH EVERY DAY 90 capsule 0   Polyethyl Glycol-Propyl Glycol (SYSTANE OP) Place 1 drop into both eyes 3 (three) times daily as needed (dry/irritated eyes).     potassium chloride SA (KLOR-CON M20) 20 MEQ tablet Take 20 mEq by mouth 3 (three) times daily.     Potassium Citrate 15 MEQ (1620 MG) TBCR Take 1 tablet by mouth in the morning and at bedtime.     Propylene Glycol (SYSTANE COMPLETE) 0.6 % SOLN Apply to eye.     SUTAB [phone] MG TABS See admin instructions.     tamsulosin (FLOMAX)  0.4 MG CAPS capsule Take 0.4 mg by mouth in the morning.     TRESIBA FLEXTOUCH 100 UNIT/ML FlexTouch Pen Inject into the skin.     indapamide (LOZOL) 1.25 MG tablet TAKE 1 TABLET BY MOUTH EVERY DAY 90 tablet 1   No facility-administered medications prior to visit.    ROS Review of Systems  Constitutional:  Positive for fatigue. Negative for appetite change, diaphoresis and unexpected weight change.  HENT: Negative.    Eyes: Negative.   Respiratory:  Negative for cough, chest tightness, shortness of breath and wheezing.   Cardiovascular:  Negative for chest pain, palpitations and leg swelling.  Gastrointestinal:  Negative for abdominal pain, constipation, diarrhea, nausea and vomiting.  Endocrine: Negative.   Genitourinary:  Negative for difficulty urinating.  Musculoskeletal: Negative.  Negative for arthralgias and myalgias.  Skin: Negative.  Negative for rash.  Neurological:  Negative for dizziness and weakness.  Hematological:  Negative for adenopathy. Does not bruise/bleed easily.  Psychiatric/Behavioral: Negative.      Objective:  BP 120/72 (BP Location: Left Arm, Patient Position: Sitting, Cuff Size: Normal)   Pulse 91   Temp 97.8 F (36.6 C) (Oral)   Ht 6\' 1"  (1.854 m)   Wt 252 lb (114.3 kg)   SpO2 93%   BMI 33.25 kg/m   BP Readings from Last 3 Encounters:  09/05/23 120/72  06/07/23 132/78  12/15/22 126/68    Wt Readings from Last 3 Encounters:  09/05/23 252 lb (114.3 kg)  06/07/23 255 lb 3.2 oz (115.8 kg)  12/15/22 255 lb (115.7 kg)    Physical Exam Vitals reviewed.  Constitutional:      Appearance: Normal appearance.  HENT:     Mouth/Throat:     Mouth: Mucous membranes are moist.  Eyes:     General: No scleral icterus.    Conjunctiva/sclera: Conjunctivae normal.  Cardiovascular:     Rate and Rhythm: Normal rate and regular rhythm.     Heart sounds: No murmur heard.    No friction rub. No gallop.  Pulmonary:     Effort: Pulmonary effort is normal.      Breath sounds: No stridor. No wheezing, rhonchi or rales.  Abdominal:     General: Abdomen is flat.     Palpations: There is no mass.     Tenderness: There is no abdominal tenderness. There is no guarding.     Hernia: No hernia is present.  Musculoskeletal:        General: Normal range of motion.     Cervical back: Neck supple.     Right lower leg: No edema.     Left lower leg: No edema.  Lymphadenopathy:     Cervical: No cervical adenopathy.  Skin:    General: Skin is warm and dry.  Neurological:     General: No focal deficit present.     Mental Status: He is alert. Mental status is at baseline.  Psychiatric:        Mood and Affect: Mood normal.        Behavior: Behavior normal.     Lab Results  Component Value Date   WBC 11.4 (H) 09/05/2023   HGB 13.9 09/05/2023   HCT 42.3 09/05/2023   PLT 187.0 09/05/2023   GLUCOSE 195 (H) 09/05/2023   CHOL 125 09/21/2022   TRIG 84 09/21/2022   HDL 37 (L) 09/21/2022   LDLCALC 71 09/21/2022   ALT 14 06/07/2023   AST 16 06/07/2023   NA 136 09/05/2023   K 4.2 09/05/2023   CL 101 09/05/2023   CREATININE 1.11 09/05/2023   BUN 19 09/05/2023   CO2 27 09/05/2023   TSH 1.43 09/05/2023   PSA 0.05 (L) 09/05/2023   INR 0.9 02/05/2021   HGBA1C 7.7 (H) 09/05/2023   MICROALBUR 1.0 06/07/2023    CT CHEST LUNG CA SCREEN LOW DOSE W/O CM Result Date: 06/28/2023 CLINICAL DATA:  Current 34 pack-year smoker. EXAM: CT CHEST WITHOUT CONTRAST LOW-DOSE FOR LUNG CANCER SCREENING TECHNIQUE: Multidetector CT imaging of the chest was performed following the standard protocol without IV contrast. RADIATION DOSE REDUCTION: This exam was performed according to the departmental dose-optimization program which includes automated exposure control, adjustment of the mA and/or kV according to patient size and/or use of iterative reconstruction technique. COMPARISON:  06/02/2022. FINDINGS: Cardiovascular: Atherosclerotic calcification of the aorta and all 3  coronary arteries. Heart is at the upper limits of normal in size. Small anterior pericardial fluid or thickening, unchanged. Mediastinum/Nodes: No pathologically enlarged mediastinal or axillary lymph nodes. Hilar regions are difficult to definitively evaluate without IV contrast. Esophagus is grossly unremarkable. Lungs/Pleura: Centrilobular and paraseptal emphysema. 3.1 mm left upper lobe nodule, as before. No new pulmonary nodules. No pleural fluid. Airway is unremarkable. Upper Abdomen: Punctate bilateral renal stones. Visualized portions of the liver, gallbladder, adrenal glands, kidneys, spleen, pancreas, stomach and bowel are otherwise grossly unremarkable. No upper abdominal adenopathy. Musculoskeletal: Minimal degenerative change in the spine. IMPRESSION: 1. Lung-RADS 2, benign appearance or behavior. Continue annual screening with low-dose chest CT without contrast in 12 months. 2. Punctate bilateral renal stones. 3. Aortic atherosclerosis (ICD10-I70.0). Three-vessel coronary artery calcification. 4.  Emphysema (ICD10-J43.9). Electronically Signed   By: Leanna Battles M.D.   On: 06/28/2023 11:28    Assessment & Plan:   Essential hypertension- BP is over-controlled. Will discontinue indapamide. -     Basic metabolic panel; Future  Hyperlipidemia with target LDL less than 100 -     TSH; Future  Prostate cancer (HCC)- PSA remain low. -     PSA; Future  Iron deficiency anemia secondary to inadequate dietary iron intake -     IBC + Ferritin; Future -     CBC with Differential/Platelet; Future  Chronic fatigue- Labs are negative for secondary causes. -     Basic metabolic panel; Future -     CBC with Differential/Platelet; Future -     TSH; Future -     Cortisol; Future  LADA (latent autoimmune diabetes in adults), managed as type 1 (HCC)- Blood sugar is adequately well controlled. -     Basic metabolic panel; Future -     Hemoglobin A1c; Future     Follow-up: Return in about 4  months (  around 01/03/2024).  Sanda Linger, MD

## 2023-09-05 NOTE — Patient Instructions (Signed)
 Hypertension, Adult High blood pressure (hypertension) is when the force of blood pumping through the arteries is too strong. The arteries are the blood vessels that carry blood from the heart throughout the body. Hypertension forces the heart to work harder to pump blood and may cause arteries to become narrow or stiff. Untreated or uncontrolled hypertension can lead to a heart attack, heart failure, a stroke, kidney disease, and other problems. A blood pressure reading consists of a higher number over a lower number. Ideally, your blood pressure should be below 120/80. The first ("top") number is called the systolic pressure. It is a measure of the pressure in your arteries as your heart beats. The second ("bottom") number is called the diastolic pressure. It is a measure of the pressure in your arteries as the heart relaxes. What are the causes? The exact cause of this condition is not known. There are some conditions that result in high blood pressure. What increases the risk? Certain factors may make you more likely to develop high blood pressure. Some of these risk factors are under your control, including: Smoking. Not getting enough exercise or physical activity. Being overweight. Having too much fat, sugar, calories, or salt (sodium) in your diet. Drinking too much alcohol. Other risk factors include: Having a personal history of heart disease, diabetes, high cholesterol, or kidney disease. Stress. Having a family history of high blood pressure and high cholesterol. Having obstructive sleep apnea. Age. The risk increases with age. What are the signs or symptoms? High blood pressure may not cause symptoms. Very high blood pressure (hypertensive crisis) may cause: Headache. Fast or irregular heartbeats (palpitations). Shortness of breath. Nosebleed. Nausea and vomiting. Vision changes. Severe chest pain, dizziness, and seizures. How is this diagnosed? This condition is diagnosed by  measuring your blood pressure while you are seated, with your arm resting on a flat surface, your legs uncrossed, and your feet flat on the floor. The cuff of the blood pressure monitor will be placed directly against the skin of your upper arm at the level of your heart. Blood pressure should be measured at least twice using the same arm. Certain conditions can cause a difference in blood pressure between your right and left arms. If you have a high blood pressure reading during one visit or you have normal blood pressure with other risk factors, you may be asked to: Return on a different day to have your blood pressure checked again. Monitor your blood pressure at home for 1 week or longer. If you are diagnosed with hypertension, you may have other blood or imaging tests to help your health care provider understand your overall risk for other conditions. How is this treated? This condition is treated by making healthy lifestyle changes, such as eating healthy foods, exercising more, and reducing your alcohol intake. You may be referred for counseling on a healthy diet and physical activity. Your health care provider may prescribe medicine if lifestyle changes are not enough to get your blood pressure under control and if: Your systolic blood pressure is above 130. Your diastolic blood pressure is above 80. Your personal target blood pressure may vary depending on your medical conditions, your age, and other factors. Follow these instructions at home: Eating and drinking  Eat a diet that is high in fiber and potassium, and low in sodium, added sugar, and fat. An example of this eating plan is called the DASH diet. DASH stands for Dietary Approaches to Stop Hypertension. To eat this way: Eat  plenty of fresh fruits and vegetables. Try to fill one half of your plate at each meal with fruits and vegetables. Eat whole grains, such as whole-wheat pasta, brown rice, or whole-grain bread. Fill about one  fourth of your plate with whole grains. Eat or drink low-fat dairy products, such as skim milk or low-fat yogurt. Avoid fatty cuts of meat, processed or cured meats, and poultry with skin. Fill about one fourth of your plate with lean proteins, such as fish, chicken without skin, beans, eggs, or tofu. Avoid pre-made and processed foods. These tend to be higher in sodium, added sugar, and fat. Reduce your daily sodium intake. Many people with hypertension should eat less than 1,500 mg of sodium a day. Do not drink alcohol if: Your health care provider tells you not to drink. You are pregnant, may be pregnant, or are planning to become pregnant. If you drink alcohol: Limit how much you have to: 0-1 drink a day for women. 0-2 drinks a day for men. Know how much alcohol is in your drink. In the [place] one drink equals one 12 oz bottle of beer (355 mL), one 5 oz glass of wine (148 mL), or one 1 oz glass of hard liquor (44 mL). Lifestyle  Work with your health care provider to maintain a healthy body weight or to lose weight. Ask what an ideal weight is for you. Get at least 30 minutes of exercise that causes your heart to beat faster (aerobic exercise) most days of the week. Activities may include walking, swimming, or biking. Include exercise to strengthen your muscles (resistance exercise), such as Pilates or lifting weights, as part of your weekly exercise routine. Try to do these types of exercises for 30 minutes at least 3 days a week. Do not use any products that contain nicotine or tobacco. These products include cigarettes, chewing tobacco, and vaping devices, such as e-cigarettes. If you need help quitting, ask your health care provider. Monitor your blood pressure at home as told by your health care provider. Keep all follow-up visits. This is important. Medicines Take over-the-counter and prescription medicines only as told by your health care provider. Follow directions carefully. Blood  pressure medicines must be taken as prescribed. Do not skip doses of blood pressure medicine. Doing this puts you at risk for problems and can make the medicine less effective. Ask your health care provider about side effects or reactions to medicines that you should watch for. Contact a health care provider if you: Think you are having a reaction to a medicine you are taking. Have headaches that keep coming back (recurring). Feel dizzy. Have swelling in your ankles. Have trouble with your vision. Get help right away if you: Develop a severe headache or confusion. Have unusual weakness or numbness. Feel faint. Have severe pain in your chest or abdomen. Vomit repeatedly. Have trouble breathing. These symptoms may be an emergency. Get help right away. Call 911. Do not wait to see if the symptoms will go away. Do not drive yourself to the hospital. Summary Hypertension is when the force of blood pumping through your arteries is too strong. If this condition is not controlled, it may put you at risk for serious complications. Your personal target blood pressure may vary depending on your medical conditions, your age, and other factors. For most people, a normal blood pressure is less than 120/80. Hypertension is treated with lifestyle changes, medicines, or a combination of both. Lifestyle changes include losing weight, eating a healthy,  low-sodium diet, exercising more, and limiting alcohol. This information is not intended to replace advice given to you by your health care provider. Make sure you discuss any questions you have with your health care provider. Document Revised: 05/11/2021 Document Reviewed: 05/11/2021 Elsevier Patient Education  2024 [employer].

## 2023-09-06 ENCOUNTER — Encounter: Payer: Self-pay | Admitting: Internal Medicine

## 2023-09-06 NOTE — Progress Notes (Signed)
Subjective:  Patient ID: Anthony Simmonds., male    DOB: 1950-12-06  Age: 73 y.o. MRN: [id number]  CC: No chief complaint on file.   HPI Anthony Daniel. presents for duplicate  Outpatient Medications Prior to Visit  Medication Sig Dispense Refill   allopurinol (ZYLOPRIM) 100 MG tablet TAKE 1 TABLET BY MOUTH EVERY DAY 90 tablet 1   aspirin EC 81 MG tablet Take 81 mg by mouth in the morning.     candesartan (ATACAND) 16 MG tablet Take 1 tablet (16 mg total) by mouth daily. 90 tablet 1   Ciclopirox 0.77 % gel APPLY TO AFFECTED AREA TWICE A DAY 30 g 2   Continuous Blood Gluc Receiver (DEXCOM G6 RECEIVER) DEVI See admin instructions.     Continuous Blood Gluc Sensor (DEXCOM G6 SENSOR) MISC change sensor     Continuous Blood Gluc Transmit (DEXCOM G6 TRANSMITTER) MISC See admin instructions.     ezetimibe (ZETIA) 10 MG tablet TAKE 1 TABLET BY MOUTH EVERY DAY 90 tablet 1   Ferric Maltol (ACCRUFER) 30 MG CAPS Take 1 capsule (30 mg total) by mouth in the morning and at bedtime. 180 capsule 0   glucose blood (ONETOUCH VERIO) test strip 5X A DAY     GVOKE HYPOPEN 2-PACK 1 MG/0.2ML SOAJ INJECT 1 ACT INTO THE SKIN DAILY AS NEEDED (HYPOGLYCEMIA). 0.2 mL 3   insulin aspart (NOVOLOG) 100 UNIT/ML injection Inject into the skin as directed. USE VIA INSULIN PUMP TO ADMINISTER UP TO 130 UNITS MAXIUM DAILY     Insulin Glargine (LANTUS SOLOSTAR [place]) Inject into the skin 2 (two) times daily. 25 UNITS IN AM AND  20--25 UNITS;  WHEN FASTING OR UNABLE TO EAT LOWER TO 18 UNITS TWICE DAILY     Insulin Human (INSULIN PUMP) SOLN Inject 1 each into the skin continuous. Novolog insulin     levocetirizine (XYZAL) 5 MG tablet TAKE 1 TABLET BY MOUTH DAILY AS NEEDED FOR ALLERGIES. 90 tablet 1   mesalamine (CANASA) 1000 MG suppository Place 1,000 mg rectally at bedtime.     neomycin-polymyxin-hydrocortisone (CORTISPORIN) 3.5-10000-1 OTIC suspension Place 3 drops into both ears 3 (three) times daily. 10 mL 2   nicotine  (NICODERM CQ - DOSED IN MG/24 HOURS) 14 mg/24hr patch PLACE 1 PATCH ONTO THE SKIN DAILY. 28 patch 0   NOVOLOG FLEXPEN 100 UNIT/ML FlexPen Inject 12-15 Units into the skin 3 (three) times daily before meals. WHEN NOT USING INSULIN PUMP ;  WHEN FASTING OR UNABLE TO EAT ONLY WHEN NEEDED TO TREAT BLOOD SUGAR >200     nystatin (MYCOSTATIN/NYSTOP) powder Apply 1 Application topically.     omeprazole (PRILOSEC) 20 MG capsule TAKE 1 CAPSULE BY MOUTH EVERY DAY 90 capsule 0   Polyethyl Glycol-Propyl Glycol (SYSTANE OP) Place 1 drop into both eyes 3 (three) times daily as needed (dry/irritated eyes).     potassium chloride SA (KLOR-CON M20) 20 MEQ tablet Take 20 mEq by mouth 3 (three) times daily.     Potassium Citrate 15 MEQ (1620 MG) TBCR Take 1 tablet by mouth in the morning and at bedtime.     Propylene Glycol (SYSTANE COMPLETE) 0.6 % SOLN Apply to eye.     SUTAB [phone] MG TABS See admin instructions.     tamsulosin (FLOMAX) 0.4 MG CAPS capsule Take 0.4 mg by mouth in the morning.     TRESIBA FLEXTOUCH 100 UNIT/ML FlexTouch Pen Inject into the skin.     No facility-administered medications prior  to visit.    ROS Review of Systems  Objective:  There were no vitals taken for this visit.  BP Readings from Last 3 Encounters:  09/05/23 120/72  06/07/23 132/78  12/15/22 126/68    Wt Readings from Last 3 Encounters:  09/05/23 252 lb (114.3 kg)  06/07/23 255 lb 3.2 oz (115.8 kg)  12/15/22 255 lb (115.7 kg)    Physical Exam  Lab Results  Component Value Date   WBC 11.4 (H) 09/05/2023   HGB 13.9 09/05/2023   HCT 42.3 09/05/2023   PLT 187.0 09/05/2023   GLUCOSE 195 (H) 09/05/2023   CHOL 125 09/21/2022   TRIG 84 09/21/2022   HDL 37 (L) 09/21/2022   LDLCALC 71 09/21/2022   ALT 14 06/07/2023   AST 16 06/07/2023   NA 136 09/05/2023   K 4.2 09/05/2023   CL 101 09/05/2023   CREATININE 1.11 09/05/2023   BUN 19 09/05/2023   CO2 27 09/05/2023   TSH 1.43 09/05/2023   PSA 0.05 (L)  09/05/2023   INR 0.9 02/05/2021   HGBA1C 7.7 (H) 09/05/2023   MICROALBUR 1.0 06/07/2023    CT CHEST LUNG CA SCREEN LOW DOSE W/O CM Result Date: 06/28/2023 CLINICAL DATA:  Current 34 pack-year smoker. EXAM: CT CHEST WITHOUT CONTRAST LOW-DOSE FOR LUNG CANCER SCREENING TECHNIQUE: Multidetector CT imaging of the chest was performed following the standard protocol without IV contrast. RADIATION DOSE REDUCTION: This exam was performed according to the departmental dose-optimization program which includes automated exposure control, adjustment of the mA and/or kV according to patient size and/or use of iterative reconstruction technique. COMPARISON:  06/02/2022. FINDINGS: Cardiovascular: Atherosclerotic calcification of the aorta and all 3 coronary arteries. Heart is at the upper limits of normal in size. Small anterior pericardial fluid or thickening, unchanged. Mediastinum/Nodes: No pathologically enlarged mediastinal or axillary lymph nodes. Hilar regions are difficult to definitively evaluate without IV contrast. Esophagus is grossly unremarkable. Lungs/Pleura: Centrilobular and paraseptal emphysema. 3.1 mm left upper lobe nodule, as before. No new pulmonary nodules. No pleural fluid. Airway is unremarkable. Upper Abdomen: Punctate bilateral renal stones. Visualized portions of the liver, gallbladder, adrenal glands, kidneys, spleen, pancreas, stomach and bowel are otherwise grossly unremarkable. No upper abdominal adenopathy. Musculoskeletal: Minimal degenerative change in the spine. IMPRESSION: 1. Lung-RADS 2, benign appearance or behavior. Continue annual screening with low-dose chest CT without contrast in 12 months. 2. Punctate bilateral renal stones. 3. Aortic atherosclerosis (ICD10-I70.0). Three-vessel coronary artery calcification. 4.  Emphysema (ICD10-J43.9). Electronically Signed   By: Leanna Battles M.D.   On: 06/28/2023 11:28    Assessment & Plan:  Chronic fatigue -     Cortisol -     TSH -      CBC with Differential/Platelet -     Basic metabolic panel  LADA (latent autoimmune diabetes in adults), managed as type 1 (HCC) -     Hemoglobin A1c -     Basic metabolic panel  Hyperlipidemia with target LDL less than 100 -     TSH  Prostate cancer (HCC) -     PSA  Iron deficiency anemia secondary to inadequate dietary iron intake -     CBC with Differential/Platelet -     IBC + Ferritin  Essential hypertension -     Basic metabolic panel     Follow-up: No follow-ups on file.  Sanda Linger, MD

## 2023-09-07 ENCOUNTER — Telehealth: Payer: Self-pay

## 2023-09-07 NOTE — Telephone Encounter (Signed)
Patient was identified as falling into the [place] Measure - Diabetes.   Patient was: Patient had a recent OV with provider, A1C was addressed. Is improving.

## 2023-09-10 ENCOUNTER — Other Ambulatory Visit: Payer: Self-pay | Admitting: Internal Medicine

## 2023-09-10 DIAGNOSIS — K219 Gastro-esophageal reflux disease without esophagitis: Secondary | ICD-10-CM

## 2023-09-26 DIAGNOSIS — H524 Presbyopia: Secondary | ICD-10-CM | POA: Diagnosis not present

## 2023-09-26 DIAGNOSIS — H26493 Other secondary cataract, bilateral: Secondary | ICD-10-CM | POA: Diagnosis not present

## 2023-09-26 DIAGNOSIS — E113311 Type 2 diabetes mellitus with moderate nonproliferative diabetic retinopathy with macular edema, right eye: Secondary | ICD-10-CM | POA: Diagnosis not present

## 2023-09-26 DIAGNOSIS — E113392 Type 2 diabetes mellitus with moderate nonproliferative diabetic retinopathy without macular edema, left eye: Secondary | ICD-10-CM | POA: Diagnosis not present

## 2023-09-26 DIAGNOSIS — Z961 Presence of intraocular lens: Secondary | ICD-10-CM | POA: Diagnosis not present

## 2023-09-26 LAB — HM DIABETES EYE EXAM

## 2023-10-03 DIAGNOSIS — Z720 Tobacco use: Secondary | ICD-10-CM | POA: Diagnosis not present

## 2023-10-03 DIAGNOSIS — E669 Obesity, unspecified: Secondary | ICD-10-CM | POA: Diagnosis not present

## 2023-10-03 DIAGNOSIS — E785 Hyperlipidemia, unspecified: Secondary | ICD-10-CM | POA: Diagnosis not present

## 2023-10-03 DIAGNOSIS — E1022 Type 1 diabetes mellitus with diabetic chronic kidney disease: Secondary | ICD-10-CM | POA: Diagnosis not present

## 2023-10-03 DIAGNOSIS — N182 Chronic kidney disease, stage 2 (mild): Secondary | ICD-10-CM | POA: Diagnosis not present

## 2023-10-03 DIAGNOSIS — Z794 Long term (current) use of insulin: Secondary | ICD-10-CM | POA: Diagnosis not present

## 2023-10-09 ENCOUNTER — Ambulatory Visit (INDEPENDENT_AMBULATORY_CARE_PROVIDER_SITE_OTHER): Payer: Medicare Other

## 2023-10-09 VITALS — BP 139/74 | Ht 73.0 in | Wt 245.2 lb

## 2023-10-09 DIAGNOSIS — Z0000 Encounter for general adult medical examination without abnormal findings: Secondary | ICD-10-CM

## 2023-10-09 NOTE — Progress Notes (Signed)
 Subjective:   Anthony Daniel. is a 73 y.o. who presents for a Medicare Wellness preventive visit.  Visit Complete: Virtual I connected with  Anthony Daniel. on 10/09/23 by a audio enabled telemedicine application and verified that I am speaking with the correct person using two identifiers.  Patient Location: Home  Provider Location: Home Office  I discussed the limitations of evaluation and management by telemedicine. The patient expressed understanding and agreed to proceed.  Vital Signs: Because this visit was a virtual/telehealth visit, some criteria may be missing or patient reported. Any vitals not documented were not able to be obtained and vitals that have been documented are patient reported.  VideoError- Librarian, academic were attempted between this provider and patient, however failed, due to patient having technical difficulties OR patient did not have access to video capability.  We continued and completed visit with audio only.   Persons Participating in Visit: Patient.  AWV Questionnaire: Yes: Patient Medicare AWV questionnaire was completed by the patient on 10/08/2023; I have confirmed that all information answered by patient is correct and no changes since this date.  Cardiac Risk Factors include: advanced age (>51men, >6 women);hypertension;male gender;Other (see comment);dyslipidemia, Risk factor comments: SA, Prostate cancer     Objective:    Today's Vitals   10/09/23 1343  BP: 139/74  Weight: 245 lb 3.2 oz (111.2 kg)  Height: 6\' 1"  (1.854 m)   Body mass index is 32.35 kg/m.     10/09/2023    2:04 PM 02/14/2022   10:56 AM 08/31/2021    8:11 AM 07/30/2021   10:18 AM 07/13/2021    4:02 PM 02/23/2021    1:22 PM 02/09/2021    6:04 AM  Advanced Directives  Does Patient Have a Medical Advance Directive? Yes Yes No No No No No  Type of Estate agent of [place];Living will Healthcare Power of  [place];Living will       Copy of Healthcare Power of Attorney in Chart? No - copy requested No - copy requested       Would patient like information on creating a medical advance directive?   Yes (MAU/Ambulatory/Procedural Areas - Information given) Yes (MAU/Ambulatory/Procedural Areas - Information given) No - Patient declined  Yes (MAU/Ambulatory/Procedural Areas - Information given)    Current Medications (verified) Outpatient Encounter Medications as of 10/09/2023  Medication Sig   allopurinol (ZYLOPRIM) 100 MG tablet TAKE 1 TABLET BY MOUTH EVERY DAY   Potassium Citrate 15 MEQ (1620 MG) TBCR Take 1 tablet by mouth in the morning and at bedtime.   aspirin EC 81 MG tablet Take 81 mg by mouth in the morning.   candesartan (ATACAND) 16 MG tablet Take 1 tablet (16 mg total) by mouth daily.   Ciclopirox 0.77 % gel APPLY TO AFFECTED AREA TWICE A DAY   Continuous Blood Gluc Receiver (DEXCOM G6 RECEIVER) DEVI See admin instructions.   Continuous Blood Gluc Sensor (DEXCOM G6 SENSOR) MISC change sensor   Continuous Blood Gluc Transmit (DEXCOM G6 TRANSMITTER) MISC See admin instructions.   ezetimibe (ZETIA) 10 MG tablet TAKE 1 TABLET BY MOUTH EVERY DAY   Ferric Maltol (ACCRUFER) 30 MG CAPS Take 1 capsule (30 mg total) by mouth in the morning and at bedtime.   glucose blood (ONETOUCH VERIO) test strip 5X A DAY   GVOKE HYPOPEN 2-PACK 1 MG/0.2ML SOAJ INJECT 1 ACT INTO THE SKIN DAILY AS NEEDED (HYPOGLYCEMIA).   insulin aspart (NOVOLOG) 100 UNIT/ML injection  Inject into the skin as directed. USE VIA INSULIN PUMP TO ADMINISTER UP TO 130 UNITS MAXIUM DAILY   Insulin Glargine (LANTUS SOLOSTAR [place]) Inject into the skin 2 (two) times daily. 25 UNITS IN AM AND  20--25 UNITS;  WHEN FASTING OR UNABLE TO EAT LOWER TO 18 UNITS TWICE DAILY   Insulin Human (INSULIN PUMP) SOLN Inject 1 each into the skin continuous. Novolog insulin   levocetirizine (XYZAL) 5 MG tablet TAKE 1 TABLET BY MOUTH DAILY AS NEEDED FOR  ALLERGIES.   mesalamine (CANASA) 1000 MG suppository Place 1,000 mg rectally at bedtime.   neomycin-polymyxin-hydrocortisone (CORTISPORIN) 3.5-10000-1 OTIC suspension Place 3 drops into both ears 3 (three) times daily.   nicotine (NICODERM CQ - DOSED IN MG/24 HOURS) 14 mg/24hr patch PLACE 1 PATCH ONTO THE SKIN DAILY.   NOVOLOG FLEXPEN 100 UNIT/ML FlexPen Inject 12-15 Units into the skin 3 (three) times daily before meals. WHEN NOT USING INSULIN PUMP ;  WHEN FASTING OR UNABLE TO EAT ONLY WHEN NEEDED TO TREAT BLOOD SUGAR >200   nystatin (MYCOSTATIN/NYSTOP) powder Apply 1 Application topically.   omeprazole (PRILOSEC) 20 MG capsule TAKE 1 CAPSULE BY MOUTH EVERY DAY   Polyethyl Glycol-Propyl Glycol (SYSTANE OP) Place 1 drop into both eyes 3 (three) times daily as needed (dry/irritated eyes).   potassium chloride SA (KLOR-CON M20) 20 MEQ tablet Take 20 mEq by mouth 3 (three) times daily.   Propylene Glycol (SYSTANE COMPLETE) 0.6 % SOLN Apply to eye.   SUTAB [phone] MG TABS See admin instructions.   tamsulosin (FLOMAX) 0.4 MG CAPS capsule Take 0.4 mg by mouth in the morning.   TRESIBA FLEXTOUCH 100 UNIT/ML FlexTouch Pen Inject into the skin.   No facility-administered encounter medications on file as of 10/09/2023.    Allergies (verified) Altace [ramipril], Crestor [rosuvastatin], Lipitor [atorvastatin], Dilaudid [hydromorphone], Iodinated contrast media, Iodine, Latex, Oxycodone, Adhesive [tape], and Peanut-containing drug products   History: Past Medical History:  Diagnosis Date   Arthritis    Back   Benign localized prostatic hyperplasia with lower urinary tract symptoms (LUTS)    CKD (chronic kidney disease), stage II    History of bladder cancer    1998--  TCC   History of kidney stones    managed by urologist--- dr Annabell Howells, dr prerminger @[facility] , and dr [email or web address]-aceves @[email or web address]    Hyperlipidemia    Hypertension    Insulin pump in place    02-02-2021 per pt currently not using pump,  doing lantus insulin bid as directed by dr Sharl Ma   LADA (latent autoimmune diabetes in adults), managed as type 1 [facility])    endocrinologist-- dr Sharl Ma   PONV (postoperative nausea and vomiting)    Prostate cancer [facility])    urologist--- dr Annabell Howells--  first dx 02/ 2020 gleason 3+3, active surveillance;  bx 09-23-2020  Gleason 4+4, PSA 7.64   Spinal stenosis, cervical region    Uses self-applied continuous glucose monitoring device    DEX-COM   Past Surgical History:  Procedure Laterality Date   ANTERIOR CERVICAL DECOMP/DISCECTOMY FUSION N/A 12/15/2020   Procedure: Cervical Three-Four Cervical Four-Five Anterior cervical decompression/discectomy/fusion;  Surgeon: Maeola Harman, MD;  Location: [facility];  Service: Neurosurgery;  Laterality: N/A;   COLONOSCOPY  2018   x2   CYSTOSCOPY  02/09/2021   Procedure: CYSTOSCOPY FLEXIBLE;  Surgeon: Bjorn Pippin, MD;  Location: [facility];  Service: Urology;;   CYSTOSCOPY W/ URETERAL STENT PLACEMENT Right 05/12/2015   Procedure: CYSTOSCOPY WITH STENT REPLACEMENT;  Surgeon: Malen Gauze,  MD;  Location: [facility];  Service: Urology;  Laterality: Right;   CYSTOSCOPY WITH RETROGRADE PYELOGRAM, URETEROSCOPY AND STENT PLACEMENT Right 01/23/2019   Procedure: CYSTOSCOPY WITH RIGHT RETROGRADE PYELOGRAM, URETEROSCOPY AND STENT PLACEMENT;  Surgeon: Bjorn Pippin, MD;  Location: [facility];  Service: Urology;  Laterality: Right;   CYSTOSCOPY WITH URETEROSCOPY AND STENT PLACEMENT Right 04/23/2015   Procedure: CYSTOSCOPY WITH URETEROSCOPY RETROGRADE PYELOGRAM AND STENT PLACEMENT;  Surgeon: Malen Gauze, MD;  Location: [facility];  Service: Urology;  Laterality: Right;   CYSTOSCOPY/RETROGRADE/URETEROSCOPY/STONE EXTRACTION WITH BASKET Right 05/12/2015   Procedure: CYSTOSCOPY/RETROGRADE/URETEROSCOPY/STONE EXTRACTION WITH BASKET;  Surgeon: Malen Gauze, MD;  Location: [facility];  Service: Urology;  Laterality: Right;    CYSTOSCOPY/URETEROSCOPY/HOLMIUM LASER/STENT PLACEMENT Left 09/18/2018   Procedure: CYSTOSCOPY/ LEFT RETROGRADE LEFT URETEROSCOPY /HOLMIUM LASER/STENT PLACEMENT;  Surgeon: Bjorn Pippin, MD;  Location: [facility];  Service: Urology;  Laterality: Left;   CYSTOSCOPY/URETEROSCOPY/HOLMIUM LASER/STENT PLACEMENT Right 10/24/2019   Procedure: CYSTOSCOPY/URETEROSCOPY/HOLMIUM LASER/STENT PLACEMENT;  Surgeon: Bjorn Pippin, MD;  [facility];  Service: Urology;  Laterality: Right;   CYSTOSCOPY/URETEROSCOPY/HOLMIUM LASER/STENT PLACEMENT Left 01/30/2020   @[facility]    CYSTOSCOPY/URETEROSCOPY/HOLMIUM LASER/STENT PLACEMENT Left 07/13/2021   Procedure: CYSTOSCOPY/URETEROSCOPY/HOLMIUM LASER/STENT PLACEMENT;  Surgeon: Crista Elliot, MD;  Location[facility];  Service: Urology;  Laterality: Left;   EXTRACORPOREAL SHOCK WAVE LITHOTRIPSY  x2 in  2006//   x2  in 2012   HOLMIUM LASER APPLICATION Right 05/12/2015   Procedure: HOLMIUM LASER APPLICATION;  Surgeon: Malen Gauze, MD;  Location[facility];  Service: Urology;  Laterality: Right;   HOLMIUM LASER APPLICATION Right 01/23/2019   Procedure: HOLMIUM LASER APPLICATION;  Surgeon: Bjorn Pippin, MD;  Location[facility];  Service: Urology;  Laterality: Right;   IR URETERAL STENT RIGHT NEW ACCESS W/O SEP NEPHROSTOMY CATH  11/25/2019   LAPAROSCOPIC APPENDECTOMY N/A 11/07/2012   Procedure: APPENDECTOMY LAPAROSCOPIC;  Surgeon: Shelly Rubenstein, MD;  Location: [facility];  Service: General;  Laterality: N/A;   NEPHROLITHOTOMY Left 08/06/2015   Procedure: 1ST STAGE  LEFT PERCUTANEOUS NEPHROLITHOTOMY ;  Surgeon: Bjorn Pippin, MD;  Location[facility];  Service: Urology;  Laterality: Left;   NEPHROLITHOTOMY Right 11/26/2019   Procedure: NEPHROLITHOTOMY PERCUTANEOUS;  Surgeon: Bjorn Pippin, MD;  Location[facility];  Service: Urology;  Laterality: Right;   NEPHROLITHOTOMY Right 11/28/2019   Procedure: NEPHROLITHOTOMY PERCUTANEOUS SECOND LOOK;  Surgeon:  Sebastian Ache, MD;  Location: [facility];  Service: Urology;  Laterality: Right;   RADIOACTIVE SEED IMPLANT N/A 02/09/2021   Procedure: RADIOACTIVE SEED IMPLANT/BRACHYTHERAPY IMPLANT;  Surgeon: Bjorn Pippin, MD;  Location: [facility];  Service: Urology;  Laterality: N/A;   SPACE OAR INSTILLATION N/A 02/09/2021   Procedure: SPACE OAR INSTILLATION;  Surgeon: Bjorn Pippin, MD;  Location: [facility];  Service: Urology;  Laterality: N/A;   TONSILLECTOMY  1975   TRANSURETHRAL RESECTION OF BLADDER TUMOR  1998   Family History  Problem Relation Age of Onset   Cancer Other        Colon and Prostate Cancer   Hypertension Other    Diabetes Other    Alcohol abuse Neg Hx    Drug abuse Neg Hx    Early death Neg Hx    Heart disease Neg Hx    Hyperlipidemia Neg Hx    Kidney disease Neg Hx    Stroke Neg Hx    Social History   Socioeconomic History   Marital status: Married    Spouse  name: Myrene Buddy   Number of children: 3   Years of education: Not on file   Highest education level: Master's degree (e.g., MA, MS, MEng, MEd, MSW, MBA)  Occupational History   Occupation: RETIRED  Tobacco Use   Smoking status: Every Day    Current packs/day: 0.50    Average packs/day: 0.5 packs/day for 40.0 years (20.0 ttl pk-yrs)    Types: Cigarettes    Passive exposure: Current   Smokeless tobacco: Never  Vaping Use   Vaping status: Never Used  Substance and Sexual Activity   Alcohol use: Not Currently    Alcohol/week: 5.0 standard drinks of alcohol    Types: 5 Shots of liquor per week    Comment: occasional   Drug use: No   Sexual activity: Yes    Partners: Female  Other Topics Concern   Not on file  Social History Narrative   Lives with his wife and a dog   Social Drivers of Health   Financial Resource Strain: Low Risk  (10/08/2023)   Overall Financial Resource Strain (CARDIA)    Difficulty of Paying Living Expenses: Not hard at all  Food Insecurity: No Food Insecurity  (10/08/2023)   Hunger Vital Sign    Worried About Running Out of Food in the Last Year: Never true    Ran Out of Food in the Last Year: Never true  Transportation Needs: No Transportation Needs (10/08/2023)   PRAPARE - Administrator, Civil Service (Medical): No    Lack of Transportation (Non-Medical): No  Physical Activity: Sufficiently Active (10/08/2023)   Exercise Vital Sign    Days of Exercise per Week: 7 days    Minutes of Exercise per Session: 30 min  Stress: Stress Concern Present (10/08/2023)   [employer] of Occupational Health - Occupational Stress Questionnaire    Feeling of Stress : To some extent  Social Connections: Moderately Integrated (10/08/2023)   Social Connection and Isolation Panel [NHANES]    Frequency of Communication with Friends and Family: Once a week    Frequency of Social Gatherings with Friends and Family: Once a week    Attends Religious Services: More than 4 times per year    Active Member of [employer] or Organizations: Yes    Attends Engineer, structural: More than 4 times per year    Marital Status: Married    Tobacco Counseling Ready to quit: Not Answered Counseling given: Not Answered    Clinical Intake:  Pre-visit preparation completed: Yes  Pain : No/denies pain     BMI - recorded: 32.35 Nutritional Status: BMI > 30  Obese Nutritional Risks: None Diabetes: Yes CBG done?: Yes (167) CBG resulted in Enter/ Edit results?: No Did pt. bring in CBG monitor from home?: No  Lab Results  Component Value Date   HGBA1C 7.7 (H) 09/05/2023   HGBA1C 7.8 04/05/2023   HGBA1C 8.2 09/20/2022     How often do you need to have someone help you when you read instructions, pamphlets, or other written materials from your doctor or pharmacy?: 1 - Never  Interpreter Needed?: No  Information entered by :: Marializ Ferrebee, RMA   Activities of Daily Living     10/09/2023    1:53 PM  In your present state of health, do you have  any difficulty performing the following activities:  Hearing? 0  Vision? 0  Difficulty concentrating or making decisions? 0  Walking or climbing stairs? 0  Dressing or bathing? 0  Doing  errands, shopping? 0  Preparing Food and eating ? N  Using the Toilet? N  In the past six months, have you accidently leaked urine? N  Do you have problems with loss of bowel control? N  Managing your Medications? N  Managing your Finances? N  Housekeeping or managing your Housekeeping? N    Patient Care Team: Etta Grandchild, MD as PCP - General (Internal Medicine) Bjorn Pippin, MD as Consulting Physician (Urology) Talmage Coin, MD as Consulting Physician (Endocrinology) Stephannie Li, MD as Consulting Physician (Ophthalmology) Felicita Gage, RN as Oncology Nurse Navigator  Indicate any recent Medical Services you may have received from other than [facility] providers in the past year (date may be approximate).     Assessment:   This is a routine wellness examination for [place].  Hearing/Vision screen Hearing Screening - Comments:: Denies hearing difficulties   Vision Screening - Comments:: Denies vision issues.    Goals Addressed             This Visit's Progress    Client understands the importance of follow-up with providers by attending scheduled visits   On track    I would like to maintain active by walking everyday, watch my diet and continue to drink water.  I would like to lose 15-20 pounds with diet and exercise.       Depression Screen     10/09/2023    2:06 PM 09/27/2022    9:15 AM 03/08/2022   10:09 AM 02/14/2022   11:00 AM 02/14/2022   10:57 AM 02/14/2022   10:55 AM 09/07/2021   10:05 AM  PHQ 2/9 Scores  PHQ - 2 Score 0 0 0 0 0 0 0  PHQ- 9 Score 0  3        Fall Risk     10/09/2023    2:04 PM 09/27/2022    9:15 AM 03/08/2022   10:08 AM 02/14/2022   11:00 AM 02/14/2022   10:57 AM  Fall Risk   Falls in the past year? 0 0 0 0 0  Number falls in past yr: 0 0 0 0 0   Injury with Fall? 0 0 0 0 0  Risk for fall due to : No Fall Risks No Fall Risks No Fall Risks    Follow up Falls prevention discussed;Falls evaluation completed Falls evaluation completed Falls evaluation completed Falls evaluation completed;Education provided Falls evaluation completed;Education provided    MEDICARE RISK AT HOME:  Medicare Risk at Home Any stairs in or around the home?: Yes If so, are there any without handrails?: Yes Home free of loose throw rugs in walkways, pet beds, electrical cords, etc?: Yes Adequate lighting in your home to reduce risk of falls?: Yes Life alert?: No Use of a cane, walker or w/c?: No Grab bars in the bathroom?: Yes Shower chair or bench in shower?: No Elevated toilet seat or a handicapped toilet?: No  TIMED UP AND GO:  Was the test performed?  No  Cognitive Function: Normal: Normal cognitive status assessed by direct observation by this Clinical Health Advisor. No abnormalities found. Patient is able to answer questions in an accurate and timely manner.        02/14/2022   11:01 AM  6CIT Screen  What Year? 0 points  What month? 0 points  What time? 0 points  Count back from 20 0 points  Months in reverse 0 points  Repeat phrase 0 points  Total Score 0 points    Immunizations  Immunization History  Administered Date(s) Administered   Fluad Quad(high Dose 65+) 07/28/2020, 05/11/2022   Fluad Trivalent(High Dose 65+) 06/07/2023   Hep A / Hep B 09/24/2012, 11/05/2012   Influenza Split 04/17/2021   Influenza Whole 05/02/2013   Influenza, High Dose Seasonal PF 04/04/2018, 04/02/2019, 04/29/2020   Influenza, Seasonal, Injecte, Preservative Fre 07/01/2013   Influenza,inj,Quad PF,6+ Mos 05/23/2011, 05/02/2012, 03/07/2014, 03/22/2016   Influenza,inj,quad, With Preservative 04/29/2015   Influenza-Unspecified 03/07/2014, 03/22/2016, 04/17/2017, 04/02/2019, 04/17/2022   PFIZER(Purple Top)SARS-COV-2 Vaccination 08/24/2019, 09/18/2019,  05/10/2020   Pfizer Covid-19 Vaccine Bivalent Booster 29yrs & up 04/25/2021   Pfizer(Comirnaty)Fall Seasonal Vaccine 12 years and older 05/15/2022, 05/27/2023   Pneumococcal Conjugate-13 04/08/2014   Pneumococcal Polysaccharide-23 11/03/2010, 03/04/2013, 07/28/2020   Tdap 06/26/2012, 01/13/2020   Zoster, Live 02/23/2011    Screening Tests Health Maintenance  Topic Date Due   Zoster Vaccines- Shingrix (1 of 2) 09[birth year]06/1971   Medicare Annual Wellness (AWV)  02/15/2023   COVID-19 Vaccine (7 - Pfizer risk 2024-25 season) 11/24/2023   HEMOGLOBIN A1C  03/04/2024   Lung Cancer Screening  06/04/2024   Diabetic kidney evaluation - Urine ACR  06/06/2024   FOOT EXAM  06/06/2024   Diabetic kidney evaluation - eGFR measurement  09/04/2024   OPHTHALMOLOGY EXAM  09/25/2024   DTaP/Tdap/Td (3 - Td or Tdap) 01/12/2030   Colonoscopy  11/29/2032   Pneumonia Vaccine 68+ Years old  Completed   INFLUENZA VACCINE  Completed   Hepatitis C Screening  Completed   HPV VACCINES  Aged Out    Health Maintenance  Health Maintenance Due  Topic Date Due   Zoster Vaccines- Shingrix (1 of 2) 09/06/1971   Medicare Annual Wellness (AWV)  02/15/2023   Health Maintenance Items Addressed: See Nurse Notes  Additional Screening:  Vision Screening: Recommended annual ophthalmology exams for early detection of glaucoma and other disorders of the eye.  Dental Screening: Recommended annual dental exams for proper oral hygiene  Community Resource Referral / Chronic Care Management: CRR required this visit?  No   CCM required this visit?  No     Plan:     I have personally reviewed and noted the following in the patient's chart:   Medical and social history Use of alcohol, tobacco or illicit drugs  Current medications and supplements including opioid prescriptions. Patient is not currently taking opioid prescriptions. Functional ability and status Nutritional status Physical activity Advanced  directives List of other physicians Hospitalizations, surgeries, and ER visits in previous 12 months Vitals Screenings to include cognitive, depression, and falls Referrals and appointments  In addition, I have reviewed and discussed with patient certain preventive protocols, quality metrics, and best practice recommendations. A written personalized care plan for preventive services as well as general preventive health recommendations were provided to patient.     Anael Rosch L Jeyren Danowski, CMA   10/09/2023   After Visit Summary: (MyChart) Due to this being a telephonic visit, the after visit summary with patients personalized plan was offered to patient via MyChart   Notes: Please refer to Routing Comments.

## 2023-10-09 NOTE — Patient Instructions (Signed)
 [name] , Thank you for taking time to come for your Medicare Wellness Visit. I appreciate your ongoing commitment to your health goals. Please review the following plan we discussed and let me know if I can assist you in the future.   Referrals/Orders/Follow-Ups/Clinician Recommendations: It was nice talking with you today.    This is a list of the screening recommended for you and due dates:  Health Maintenance  Topic Date Due   Zoster (Shingles) Vaccine (1 of 2) 09/06/1971   COVID-19 Vaccine (7 - Pfizer risk 2024-25 season) 11/24/2023   Hemoglobin A1C  03/04/2024   Screening for Lung Cancer  06/04/2024   Yearly kidney health urinalysis for diabetes  06/06/2024   Complete foot exam   06/06/2024   Yearly kidney function blood test for diabetes  09/04/2024   Eye exam for diabetics  09/25/2024   Medicare Annual Wellness Visit  10/08/2024   DTaP/Tdap/Td vaccine (3 - Td or Tdap) 01/12/2030   Colon Cancer Screening  11/29/2032   Pneumonia Vaccine  Completed   Flu Shot  Completed   Hepatitis C Screening  Completed   HPV Vaccine  Aged Out    Advanced directives: (Copy Requested) Please bring a copy of your health care power of attorney and living will to the office to be added to your chart at your convenience. You can mail to [employer] [address] [place], [place] [address] or email to [email or web address]  Next Medicare Annual Wellness Visit scheduled for next year: Yes

## 2023-11-08 DIAGNOSIS — N5201 Erectile dysfunction due to arterial insufficiency: Secondary | ICD-10-CM | POA: Diagnosis not present

## 2023-11-08 DIAGNOSIS — Z8546 Personal history of malignant neoplasm of prostate: Secondary | ICD-10-CM | POA: Diagnosis not present

## 2023-11-15 DIAGNOSIS — R351 Nocturia: Secondary | ICD-10-CM | POA: Diagnosis not present

## 2023-11-15 DIAGNOSIS — Z8546 Personal history of malignant neoplasm of prostate: Secondary | ICD-10-CM | POA: Diagnosis not present

## 2023-11-15 DIAGNOSIS — N401 Enlarged prostate with lower urinary tract symptoms: Secondary | ICD-10-CM | POA: Diagnosis not present

## 2023-11-15 DIAGNOSIS — N200 Calculus of kidney: Secondary | ICD-10-CM | POA: Diagnosis not present

## 2023-11-21 DIAGNOSIS — E66811 Obesity, class 1: Secondary | ICD-10-CM | POA: Diagnosis not present

## 2023-11-21 DIAGNOSIS — E785 Hyperlipidemia, unspecified: Secondary | ICD-10-CM | POA: Diagnosis not present

## 2023-11-21 DIAGNOSIS — Z6833 Body mass index (BMI) 33.0-33.9, adult: Secondary | ICD-10-CM | POA: Diagnosis not present

## 2023-11-21 DIAGNOSIS — N1831 Chronic kidney disease, stage 3a: Secondary | ICD-10-CM | POA: Diagnosis not present

## 2023-11-28 DIAGNOSIS — H31011 Macula scars of posterior pole (postinflammatory) (post-traumatic), right eye: Secondary | ICD-10-CM | POA: Diagnosis not present

## 2023-11-28 DIAGNOSIS — E113511 Type 2 diabetes mellitus with proliferative diabetic retinopathy with macular edema, right eye: Secondary | ICD-10-CM | POA: Diagnosis not present

## 2023-11-28 DIAGNOSIS — E113592 Type 2 diabetes mellitus with proliferative diabetic retinopathy without macular edema, left eye: Secondary | ICD-10-CM | POA: Diagnosis not present

## 2023-11-28 DIAGNOSIS — H43812 Vitreous degeneration, left eye: Secondary | ICD-10-CM | POA: Diagnosis not present

## 2023-12-07 ENCOUNTER — Other Ambulatory Visit: Payer: Self-pay | Admitting: Internal Medicine

## 2023-12-07 DIAGNOSIS — E785 Hyperlipidemia, unspecified: Secondary | ICD-10-CM

## 2023-12-07 DIAGNOSIS — I10 Essential (primary) hypertension: Secondary | ICD-10-CM

## 2023-12-07 DIAGNOSIS — E139 Other specified diabetes mellitus without complications: Secondary | ICD-10-CM

## 2023-12-09 ENCOUNTER — Other Ambulatory Visit: Payer: Self-pay | Admitting: Internal Medicine

## 2023-12-09 DIAGNOSIS — K219 Gastro-esophageal reflux disease without esophagitis: Secondary | ICD-10-CM

## 2024-01-04 DIAGNOSIS — Z720 Tobacco use: Secondary | ICD-10-CM | POA: Diagnosis not present

## 2024-01-04 DIAGNOSIS — E1022 Type 1 diabetes mellitus with diabetic chronic kidney disease: Secondary | ICD-10-CM | POA: Diagnosis not present

## 2024-01-04 DIAGNOSIS — E669 Obesity, unspecified: Secondary | ICD-10-CM | POA: Diagnosis not present

## 2024-01-04 DIAGNOSIS — N182 Chronic kidney disease, stage 2 (mild): Secondary | ICD-10-CM | POA: Diagnosis not present

## 2024-01-04 DIAGNOSIS — F17210 Nicotine dependence, cigarettes, uncomplicated: Secondary | ICD-10-CM | POA: Diagnosis not present

## 2024-01-04 DIAGNOSIS — Z794 Long term (current) use of insulin: Secondary | ICD-10-CM | POA: Diagnosis not present

## 2024-01-04 DIAGNOSIS — E785 Hyperlipidemia, unspecified: Secondary | ICD-10-CM | POA: Diagnosis not present

## 2024-01-07 ENCOUNTER — Other Ambulatory Visit: Payer: Self-pay | Admitting: Internal Medicine

## 2024-01-07 DIAGNOSIS — I10 Essential (primary) hypertension: Secondary | ICD-10-CM

## 2024-01-09 DIAGNOSIS — E103311 Type 1 diabetes mellitus with moderate nonproliferative diabetic retinopathy with macular edema, right eye: Secondary | ICD-10-CM | POA: Diagnosis not present

## 2024-01-09 DIAGNOSIS — E103392 Type 1 diabetes mellitus with moderate nonproliferative diabetic retinopathy without macular edema, left eye: Secondary | ICD-10-CM | POA: Diagnosis not present

## 2024-01-09 DIAGNOSIS — H26493 Other secondary cataract, bilateral: Secondary | ICD-10-CM | POA: Diagnosis not present

## 2024-01-22 DIAGNOSIS — E113392 Type 2 diabetes mellitus with moderate nonproliferative diabetic retinopathy without macular edema, left eye: Secondary | ICD-10-CM | POA: Diagnosis not present

## 2024-01-22 DIAGNOSIS — E113511 Type 2 diabetes mellitus with proliferative diabetic retinopathy with macular edema, right eye: Secondary | ICD-10-CM | POA: Diagnosis not present

## 2024-01-22 DIAGNOSIS — H15102 Unspecified episcleritis, left eye: Secondary | ICD-10-CM | POA: Diagnosis not present

## 2024-01-22 DIAGNOSIS — Z9889 Other specified postprocedural states: Secondary | ICD-10-CM | POA: Diagnosis not present

## 2024-01-29 DIAGNOSIS — E103511 Type 1 diabetes mellitus with proliferative diabetic retinopathy with macular edema, right eye: Secondary | ICD-10-CM | POA: Diagnosis not present

## 2024-01-29 DIAGNOSIS — Z9889 Other specified postprocedural states: Secondary | ICD-10-CM | POA: Diagnosis not present

## 2024-01-29 DIAGNOSIS — H15102 Unspecified episcleritis, left eye: Secondary | ICD-10-CM | POA: Diagnosis not present

## 2024-01-29 DIAGNOSIS — E103492 Type 1 diabetes mellitus with severe nonproliferative diabetic retinopathy without macular edema, left eye: Secondary | ICD-10-CM | POA: Diagnosis not present

## 2024-01-31 ENCOUNTER — Emergency Department (HOSPITAL_COMMUNITY)
Admission: EM | Admit: 2024-01-31 | Discharge: 2024-02-01 | Discharge status: Against Medical Advice | Attending: Emergency Medicine | Admitting: Emergency Medicine

## 2024-01-31 ENCOUNTER — Encounter (HOSPITAL_COMMUNITY): Payer: Self-pay

## 2024-01-31 ENCOUNTER — Ambulatory Visit: Payer: Self-pay | Admitting: *Deleted

## 2024-01-31 ENCOUNTER — Other Ambulatory Visit: Payer: Self-pay

## 2024-01-31 ENCOUNTER — Emergency Department (HOSPITAL_COMMUNITY)

## 2024-01-31 DIAGNOSIS — Z5321 Procedure and treatment not carried out due to patient leaving prior to being seen by health care provider: Secondary | ICD-10-CM | POA: Diagnosis not present

## 2024-01-31 DIAGNOSIS — M25512 Pain in left shoulder: Secondary | ICD-10-CM | POA: Diagnosis not present

## 2024-01-31 DIAGNOSIS — R079 Chest pain, unspecified: Secondary | ICD-10-CM | POA: Diagnosis not present

## 2024-01-31 DIAGNOSIS — R000 Tachycardia, unspecified: Secondary | ICD-10-CM | POA: Diagnosis not present

## 2024-01-31 DIAGNOSIS — M79601 Pain in right arm: Secondary | ICD-10-CM | POA: Diagnosis not present

## 2024-01-31 LAB — BASIC METABOLIC PANEL WITH GFR
Anion gap: 11 (ref 5–15)
BUN: 10 mg/dL (ref 8–23)
CO2: 23 mmol/L (ref 22–32)
Calcium: 8.9 mg/dL (ref 8.9–10.3)
Chloride: 101 mmol/L (ref 98–111)
Creatinine, Ser: 1.01 mg/dL (ref 0.61–1.24)
GFR, Estimated: >60 mL/min (ref >60)
Glucose, Bld: 223 mg/dL — ABNORMAL HIGH (ref 70–99)
Potassium: 4.2 mmol/L (ref 3.5–5.1)
Sodium: 135 mmol/L (ref 135–145)

## 2024-01-31 LAB — CBC
HCT: 45.2 % (ref 39.0–52.0)
Hemoglobin: 15.1 g/dL (ref 13.0–17.0)
MCH: 30 pg (ref 26.0–34.0)
MCHC: 33.4 g/dL (ref 30.0–36.0)
MCV: 89.7 fL (ref 80.0–100.0)
Platelets: 201 K/uL (ref 150–400)
RBC: 5.04 MIL/uL (ref 4.22–5.81)
RDW: 15.3 % (ref 11.5–15.5)
WBC: 11.4 K/uL — ABNORMAL HIGH (ref 4.0–10.5)
nRBC: 0 % (ref 0.0–0.2)

## 2024-01-31 LAB — TSH: TSH: 0.697 u[IU]/mL (ref 0.350–4.500)

## 2024-01-31 LAB — TROPONIN I (HIGH SENSITIVITY)
Troponin I (High Sensitivity): 14 ng/L (ref <18)
Troponin I (High Sensitivity): 13 ng/L (ref <18)

## 2024-01-31 NOTE — ED Provider Triage Note (Signed)
 Emergency Medicine Provider Triage Evaluation Note  Anthony Daniel. , a 73 y.o. male  was evaluated in triage.  Pt complains of tachycardia intermittently for the past 10 days.  Patient reports that he would be alerted by his Apple Watch that he was having rapid heart rate.  He reports that the upper limit be in the 140s.  This was not associated with movement and would be associated with rest.  He does report that upon movement he does feel his heart rate is more elevated than usual.  He reports that 5 days ago he had some pain to his right shoulder with some tingling down into the elbow but that lasted only 5 minutes and has not had any recurrence of symptoms.  Denies any feelings of palpitations, chest pain, or shortness of breath.  No recent illnesses.  Was given a new eyedrop however no other new medication changes.  Review of Systems  Positive:  Negative:   Physical Exam  BP (!) 146/77 (BP Location: Right Arm)   Pulse 77   Temp 98 F (36.7 C)   Resp (!) 21   SpO2 97%  Gen:   Awake, no distress   Resp:  Normal effort  MSK:   Moves extremities without difficulty  Other:  RRR.  Is not appearing in acute distress.  Radial pulses 2+ bilaterally.  Medical Decision Making  Medically screening exam initiated at 4:04 PM.  Appropriate orders placed.  Anthony Daniel. was informed that the remainder of the evaluation will be completed by another provider, this initial triage assessment does not replace that evaluation, and the importance of remaining in the ED until their evaluation is complete.  Labs and cardiac workup initiated   [name], DEVONNA 01/31/24 1605

## 2024-01-31 NOTE — ED Triage Notes (Signed)
 PT reports for the past few days, his heart rate has been up in the 130s per his apple watch . He reports he had pain across his chest earlier today, but states it has resolved at this time. Pt AxOx

## 2024-01-31 NOTE — ED Notes (Signed)
Pt name called for updated vitals, no response 

## 2024-01-31 NOTE — ED Triage Notes (Signed)
 Pt to ED via POV c/o tachycardia x 5 days and intermittent Right arm pain. Reports smart watch is telling him heart rate is elevated. Pt denies CP.

## 2024-01-31 NOTE — Telephone Encounter (Signed)
 Copied from CRM [id number]. Topic: Clinical - Red Word Triage >> Jan 31, 2024 11:46 AM Deleta RAMAN wrote: Red Word that prompted transfer to Nurse Triage: heart rate has been above normal range of 120. Aiming high as 136. Patient would like an EKG. Tightness in right arm about 1 week ago does not have tightness on today. Reason for Disposition  [1] Heart beating very rapidly (e.g., > 140 / minute) AND [2] present now  (Exception: During exercise.)    Right arm tightness last week  Answer Assessment - Initial Assessment Questions 1. DESCRIPTION: Please describe your heart rate or heartbeat that you are having (e.g., fast/slow, regular/irregular, skipped or extra beats, palpitations)     My tightness in my right arm was a week ago.  It lasted about 5-10 min.   I took some baby aspirin .   The arm tightness went away.  It has not come back to that degree.   I have type I diabetes.  I have a high heart rate on and off for last week and a half.  Mine went as high as 136.   I was not exercising.   I got an alert from my phone.   My heart rate is elevated.    I have an eye procedure for today.   2. ONSET: When did it start? (e.g., minutes, hours, days)      Last week. Today sitting it went up to 115.    No shortness of breath or sweating.   I referred him to the ED.   He is agreeable to going. 3. DURATION: How long does it last (e.g., seconds, minutes, hours)     The right arm tightness lasted 5-10 min. Last week.   It has not happened again to that degree.    4. PATTERN Does it come and go, or has it been constant since it started?  Does it get worse with exertion?   Are you feeling it now?     It's intermittent.   My heart rate is elevated 5. TAP: Using your hand, can you tap out what you are feeling on a chair or table in front of you, so that I can hear? Note: Not all patients can do this.       115 sitting per smart watch.   I can feel it racing. 6. HEART RATE: Can you tell me your  heart rate? How many beats in 15 seconds?  Note: Not all patients can do this.       115 7. RECURRENT SYMPTOM: Have you ever had this before? If Yes, ask: When was the last time? and What happened that time?      No 8. CAUSE: What do you think is causing the palpitations?     I don't know. 9. CARDIAC HISTORY: Do you have any history of heart disease? (e.g., heart attack, angina, bypass surgery, angioplasty, arrhythmia)      No 10. OTHER SYMPTOMS: Do you have any other symptoms? (e.g., dizziness, chest pain, sweating, difficulty breathing)       Denies shortness of breath or breaking out in a sweat. Right arm tightness last week.  Took a baby aspirin  and it went away.  Has had right arm tightness again but not to the degree that it happened last week.  11. PREGNANCY: Is there any chance you are pregnant? When was your last menstrual period?       N/A  Protocols used: Heart Rate and Heartbeat Questions-A-AH FYI  Only or Action Required?: FYI only for provider.  Patient was last seen in primary care on 09/05/2023 by Joshua Debby CROME, MD.  Called Nurse Triage reporting Palpitations (Right arm tightness).  Symptoms began a week ago.  Interventions attempted: OTC medications: Took 2 baby aspirin  last week when had right arm tightness.   Been having an elevated heart rate intermittently.  Getting notifications on smart watch and he can feel his heart racing at times.  Has had more than one episode of right arm tightness since last week but not to the degree he had it last week..  Symptoms are: gradually worsening. Fast heart rate happening more frequently.  Triage Disposition: Go to ED Now (Notify PCP)  Patient/caregiver understands and will follow disposition?: Yes Pt agreeable to going to [facility] ED.    Message sent to [facility].

## 2024-02-02 ENCOUNTER — Other Ambulatory Visit: Payer: Self-pay | Admitting: Internal Medicine

## 2024-02-02 DIAGNOSIS — R82998 Other abnormal findings in urine: Secondary | ICD-10-CM

## 2024-02-02 DIAGNOSIS — M1030 Gout due to renal impairment, unspecified site: Secondary | ICD-10-CM

## 2024-02-03 ENCOUNTER — Encounter: Payer: Self-pay | Admitting: Internal Medicine

## 2024-02-05 DIAGNOSIS — N200 Calculus of kidney: Secondary | ICD-10-CM | POA: Diagnosis not present

## 2024-02-12 ENCOUNTER — Encounter: Payer: Self-pay | Admitting: Internal Medicine

## 2024-02-12 ENCOUNTER — Ambulatory Visit: Attending: Internal Medicine

## 2024-02-12 ENCOUNTER — Ambulatory Visit (INDEPENDENT_AMBULATORY_CARE_PROVIDER_SITE_OTHER): Admitting: Internal Medicine

## 2024-02-12 VITALS — BP 134/78 | HR 83 | Temp 98.2°F | Ht 73.0 in | Wt 249.0 lb

## 2024-02-12 DIAGNOSIS — R000 Tachycardia, unspecified: Secondary | ICD-10-CM | POA: Insufficient documentation

## 2024-02-12 DIAGNOSIS — I10 Essential (primary) hypertension: Secondary | ICD-10-CM | POA: Diagnosis not present

## 2024-02-12 DIAGNOSIS — R002 Palpitations: Secondary | ICD-10-CM

## 2024-02-12 DIAGNOSIS — E139 Other specified diabetes mellitus without complications: Secondary | ICD-10-CM

## 2024-02-12 NOTE — Addendum Note (Signed)
 Addended by: JOSHUA DEBBY CROME on: 02/12/2024 06:34 PM   Modules accepted: Orders

## 2024-02-12 NOTE — Patient Instructions (Signed)
Palpitations Palpitations are feelings that your heartbeat is irregular or is faster than normal. It may feel like your heart is fluttering or skipping a beat. Palpitations may be caused by many things, including smoking, caffeine, alcohol, stress, and certain medicines or drugs. Most causes of palpitations are not serious.  However, some palpitations can be a sign of a serious problem. Further tests and a thorough medical history will be done to find the cause of your palpitations. Your provider may order tests such as an ECG, labs, an echocardiogram, or an ambulatory continuous ECG monitor. Follow these instructions at home: Pay attention to any changes in your symptoms. Let your health care provider know about them. Take these actions to help manage your symptoms: Eating and drinking Follow instructions from your health care provider about eating or drinking restrictions. You may need to avoid foods and drinks that may cause palpitations. These may include: Caffeinated coffee, tea, soft drinks, and energy drinks. Chocolate. Alcohol. Diet pills. Lifestyle     Take steps to reduce your stress and anxiety. Things that can help you relax include: Yoga. Mind-body activities, such as deep breathing, meditation, or using words and images to create positive thoughts (guided imagery). Physical activity, such as swimming, jogging, or walking. Tell your health care provider if your palpitations increase with activity. If you have chest pain or shortness of breath with activity, do not continue the activity until you are seen by your health care provider. Biofeedback. This is a method that helps you learn to use your mind to control things in your body, such as your heartbeat. Get plenty of rest and sleep. Keep a regular bed time. Do not use drugs, including cocaine or ecstasy. Do not use marijuana. Do not use any products that contain nicotine or tobacco. These products include cigarettes, chewing  tobacco, and vaping devices, such as e-cigarettes. If you need help quitting, ask your health care provider. General instructions Take over-the-counter and prescription medicines only as told by your health care provider. Keep all follow-up visits. This is important. These may include visits for further testing if palpitations do not go away or get worse. Contact a health care provider if: You continue to have a fast or irregular heartbeat for a long period of time. You notice that your palpitations occur more often. Get help right away if: You have chest pain or shortness of breath. You have a severe headache. You feel dizzy or you faint. These symptoms may represent a serious problem that is an emergency. Do not wait to see if the symptoms will go away. Get medical help right away. Call your local emergency services (911 in the [place] Do not drive yourself to the hospital. Summary Palpitations are feelings that your heartbeat is irregular or is faster than normal. It may feel like your heart is fluttering or skipping a beat. Palpitations may be caused by many things, including smoking, caffeine, alcohol, stress, certain medicines, and drugs. Further tests and a thorough medical history may be done to find the cause of your palpitations. Get help right away if you faint or have chest pain, shortness of breath, severe headache, or dizziness. This information is not intended to replace advice given to you by your health care provider. Make sure you discuss any questions you have with your health care provider. Document Revised: 11/25/2020 Document Reviewed: 11/25/2020 Elsevier Patient Education  2024 [employer].

## 2024-02-12 NOTE — Progress Notes (Unsigned)
 EP to read.

## 2024-02-12 NOTE — Progress Notes (Signed)
 Subjective:  Patient ID: Anthony ONEIDA Alto Mickey., male    DOB: Mar 14, 1951  Age: 73 y.o. MRN: [id number]  CC: Anxiety (Patient mentions his watch has been notifying him that his heart rate goes up when he's just sitting still, started in June. He also mentions having tightness on both shoulders pain sometimes moves down his right arm. Patient also wanting another referral cardiologist the MD he was seeing is no longer at the practice. He did go to the ED on 01/31/24) and Hypertension   HPI Anthony Daniel. presents for f/up -----  Discussed the use of AI scribe software for clinical note transcription with the patient, who gave verbal consent to proceed.  History of Present Illness Anthony Daniel. Anthony Daniel is a 73 year old male with type 1 diabetes who presents with palpitations and elevated heart rate.  He has been experiencing palpitations and elevated heart rate since June 30th, 2025. His device has alerted him to a high heart rate while at rest, with a notable instance reaching 130 bpm while sitting. These symptoms have persisted for about a month, although similar notifications have occurred intermittently over the past six to seven months. He denies feeling the palpitations physically but experiences mild dizziness when bending down and standing up. No chest pain or shortness of breath.  He experiences tightness in his upper shoulders and neck, which he usually associates with high blood sugar levels. However, this tightness now occurs even when his blood sugar is normal. He recalls one episode of significant tightness in his right shoulder and arm, which resolved after taking aspirin .  His past medical history includes type 1 diabetes, and he mentions being prone to various symptoms due to this condition. He was previously on indapamide  for blood pressure management.    Outpatient Medications Prior to Visit  Medication Sig Dispense Refill   allopurinol  (ZYLOPRIM ) 100 MG tablet TAKE 1  TABLET BY MOUTH EVERY DAY 90 tablet 1   aspirin  EC 81 MG tablet Take 81 mg by mouth in the morning.     candesartan  (ATACAND ) 16 MG tablet TAKE 1 TABLET BY MOUTH EVERY DAY 90 tablet 1   Ciclopirox  0.77 % gel APPLY TO AFFECTED AREA TWICE A DAY 30 g 2   Continuous Blood Gluc Receiver (DEXCOM G6 RECEIVER) DEVI See admin instructions.     Continuous Blood Gluc Sensor (DEXCOM G6 SENSOR) MISC change sensor     Continuous Blood Gluc Transmit (DEXCOM G6 TRANSMITTER) MISC See admin instructions.     ezetimibe  (ZETIA ) 10 MG tablet TAKE 1 TABLET BY MOUTH EVERY DAY 90 tablet 1   Ferric Maltol  (ACCRUFER ) 30 MG CAPS Take 1 capsule (30 mg total) by mouth in the morning and at bedtime. 180 capsule 0   glucose blood (ONETOUCH VERIO) test strip 5X A DAY     GVOKE HYPOPEN  2-PACK 1 MG/0.2ML SOAJ INJECT 1 ACT INTO THE SKIN DAILY AS NEEDED (HYPOGLYCEMIA). 0.2 mL 3   insulin  aspart (NOVOLOG ) 100 UNIT/ML injection Inject into the skin as directed. USE VIA INSULIN  PUMP TO ADMINISTER UP TO 130 UNITS MAXIUM DAILY     Insulin  Glargine (LANTUS  SOLOSTAR Tatums) Inject into the skin 2 (two) times daily. 25 UNITS IN AM AND  20--25 UNITS;  WHEN FASTING OR UNABLE TO EAT LOWER TO 18 UNITS TWICE DAILY     Insulin  Human (INSULIN  PUMP) SOLN Inject 1 each into the skin continuous. Novolog  insulin      levocetirizine (XYZAL ) 5 MG tablet TAKE  1 TABLET BY MOUTH DAILY AS NEEDED FOR ALLERGIES. 90 tablet 1   mesalamine (CANASA) 1000 MG suppository Place 1,000 mg rectally at bedtime.     neomycin -polymyxin-hydrocortisone (CORTISPORIN) 3.5-10000-1 OTIC suspension Place 3 drops into both ears 3 (three) times daily. 10 mL 2   nicotine  (NICODERM CQ  - DOSED IN MG/24 HOURS) 14 mg/24hr patch PLACE 1 PATCH ONTO THE SKIN DAILY. 28 patch 0   NOVOLOG  FLEXPEN 100 UNIT/ML FlexPen Inject 12-15 Units into the skin 3 (three) times daily before meals. WHEN NOT USING INSULIN  PUMP ;  WHEN FASTING OR UNABLE TO EAT ONLY WHEN NEEDED TO TREAT BLOOD SUGAR >200      nystatin (MYCOSTATIN/NYSTOP) powder Apply 1 Application topically.     omeprazole  (PRILOSEC) 20 MG capsule TAKE 1 CAPSULE BY MOUTH EVERY DAY 90 capsule 0   Polyethyl Glycol-Propyl Glycol (SYSTANE OP) Place 1 drop into both eyes 3 (three) times daily as needed (dry/irritated eyes).     potassium chloride  SA (KLOR-CON  M20) 20 MEQ tablet Take 20 mEq by mouth 3 (three) times daily.     Potassium Citrate  15 MEQ (1620 MG) TBCR Take 1 tablet by mouth in the morning and at bedtime.     Propylene Glycol (SYSTANE COMPLETE) 0.6 % SOLN Apply to eye.     SUTAB [id number] MG TABS See admin instructions.     tamsulosin  (FLOMAX ) 0.4 MG CAPS capsule Take 0.4 mg by mouth in the morning.     TRESIBA FLEXTOUCH 100 UNIT/ML FlexTouch Pen Inject into the skin.     No facility-administered medications prior to visit.    ROS Review of Systems  Constitutional:  Negative for appetite change, chills, diaphoresis, fatigue and fever.  HENT: Negative.  Negative for trouble swallowing.   Eyes: Negative.   Respiratory:  Negative for cough, chest tightness, shortness of breath and wheezing.   Cardiovascular:  Positive for palpitations. Negative for chest pain and leg swelling.  Gastrointestinal:  Negative for abdominal pain, constipation, diarrhea, nausea and vomiting.  Endocrine: Negative.   Genitourinary: Negative.  Negative for difficulty urinating.  Musculoskeletal: Negative.  Negative for back pain and myalgias.  Skin: Negative.   Neurological:  Negative for dizziness, syncope, weakness, light-headedness and numbness.  Hematological:  Negative for adenopathy. Does not bruise/bleed easily.  Psychiatric/Behavioral:  The patient is nervous/anxious.     Objective:  BP 134/78 (BP Location: Left Arm, Patient Position: Sitting, Cuff Size: Normal)   Pulse 83   Temp 98.2 F (36.8 C) (Oral)   Ht 6' 1 (1.854 m)   Wt 249 lb (112.9 kg)   SpO2 97%   BMI 32.85 kg/m   BP Readings from Last 3 Encounters:  02/12/24  134/78  01/31/24 (!) 140/76  10/09/23 139/74    Wt Readings from Last 3 Encounters:  02/12/24 249 lb (112.9 kg)  10/09/23 245 lb 3.2 oz (111.2 kg)  09/05/23 252 lb (114.3 kg)    Physical Exam Vitals reviewed.  Constitutional:      Appearance: Normal appearance.  HENT:     Nose: Nose normal.     Mouth/Throat:     Mouth: Mucous membranes are moist.  Eyes:     General: No scleral icterus.    Conjunctiva/sclera: Conjunctivae normal.  Cardiovascular:     Rate and Rhythm: Normal rate and regular rhythm.     Heart sounds: No murmur heard.    No friction rub. No gallop.  Pulmonary:     Effort: Pulmonary effort is normal.  Breath sounds: No stridor. No wheezing, rhonchi or rales.  Abdominal:     General: Abdomen is flat.     Palpations: There is no mass.     Tenderness: There is no abdominal tenderness. There is no guarding.     Hernia: No hernia is present.  Musculoskeletal:        General: Normal range of motion.     Cervical back: Neck supple.     Right lower leg: No edema.     Left lower leg: No edema.  Lymphadenopathy:     Cervical: No cervical adenopathy.  Skin:    General: Skin is warm and dry.  Neurological:     General: No focal deficit present.     Mental Status: He is alert.  Psychiatric:        Mood and Affect: Mood normal.     Lab Results  Component Value Date   WBC 11.4 (H) 01/31/2024   HGB 15.1 01/31/2024   HCT 45.2 01/31/2024   PLT 201 01/31/2024   GLUCOSE 223 (H) 01/31/2024   CHOL 125 09/21/2022   TRIG 84 09/21/2022   HDL 37 (L) 09/21/2022   LDLCALC 71 09/21/2022   ALT 14 06/07/2023   AST 16 06/07/2023   NA 135 01/31/2024   K 4.2 01/31/2024   CL 101 01/31/2024   CREATININE 1.01 01/31/2024   BUN 10 01/31/2024   CO2 23 01/31/2024   TSH 0.697 01/31/2024   PSA 0.05 (L) 09/05/2023   INR 0.9 02/05/2021   HGBA1C 7.7 (H) 09/05/2023   MICROALBUR 4.46 01/30/2017    DG Chest 2 View Result Date: 01/31/2024 CLINICAL DATA:  Chest pain. EXAM:  CHEST - 2 VIEW COMPARISON:  07/12/2021. FINDINGS: Bilateral lung fields are clear. Bilateral costophrenic angles are clear. Normal cardio-mediastinal silhouette. No acute osseous abnormalities. The soft tissues are within normal limits. IMPRESSION: No active cardiopulmonary disease. Electronically Signed   By: Ree Molt M.D.   On: 01/31/2024 16:34    Assessment & Plan:  Tachycardia -     LONG TERM MONITOR (3-14 DAYS); Future -     Ambulatory referral to Cardiology  Palpitations -     LONG TERM MONITOR (3-14 DAYS); Future -     Ambulatory referral to Cardiology  Essential hypertension- BP is adequately well controlled.     Follow-up: Return in about 3 months (around 05/14/2024).  Debby Molt, MD

## 2024-02-13 DIAGNOSIS — E103492 Type 1 diabetes mellitus with severe nonproliferative diabetic retinopathy without macular edema, left eye: Secondary | ICD-10-CM | POA: Diagnosis not present

## 2024-02-13 DIAGNOSIS — E103511 Type 1 diabetes mellitus with proliferative diabetic retinopathy with macular edema, right eye: Secondary | ICD-10-CM | POA: Diagnosis not present

## 2024-02-19 DIAGNOSIS — N200 Calculus of kidney: Secondary | ICD-10-CM | POA: Diagnosis not present

## 2024-02-19 DIAGNOSIS — R82992 Hyperoxaluria: Secondary | ICD-10-CM | POA: Diagnosis not present

## 2024-02-19 DIAGNOSIS — R82991 Hypocitraturia: Secondary | ICD-10-CM | POA: Diagnosis not present

## 2024-02-19 DIAGNOSIS — R82998 Other abnormal findings in urine: Secondary | ICD-10-CM | POA: Diagnosis not present

## 2024-02-21 DIAGNOSIS — E119 Type 2 diabetes mellitus without complications: Secondary | ICD-10-CM | POA: Diagnosis not present

## 2024-02-21 DIAGNOSIS — Z794 Long term (current) use of insulin: Secondary | ICD-10-CM | POA: Diagnosis not present

## 2024-02-26 DIAGNOSIS — E103492 Type 1 diabetes mellitus with severe nonproliferative diabetic retinopathy without macular edema, left eye: Secondary | ICD-10-CM | POA: Diagnosis not present

## 2024-02-26 DIAGNOSIS — E103511 Type 1 diabetes mellitus with proliferative diabetic retinopathy with macular edema, right eye: Secondary | ICD-10-CM | POA: Diagnosis not present

## 2024-03-08 DIAGNOSIS — R002 Palpitations: Secondary | ICD-10-CM | POA: Diagnosis not present

## 2024-03-08 DIAGNOSIS — R000 Tachycardia, unspecified: Secondary | ICD-10-CM | POA: Diagnosis not present

## 2024-03-15 ENCOUNTER — Telehealth: Payer: Self-pay

## 2024-03-15 NOTE — Telephone Encounter (Signed)
 Patient was identified as falling into the [place] Measure - Diabetes.   Patient was: Referred to Diabetes Management. Spoke with patient, he follows with Dr.Kerr at Endocrinology. He states he will send us  his recent labs via MyChart

## 2024-03-16 DIAGNOSIS — R000 Tachycardia, unspecified: Secondary | ICD-10-CM

## 2024-03-16 DIAGNOSIS — R002 Palpitations: Secondary | ICD-10-CM

## 2024-03-18 ENCOUNTER — Other Ambulatory Visit: Payer: Self-pay | Admitting: Internal Medicine

## 2024-03-18 DIAGNOSIS — I471 Supraventricular tachycardia, unspecified: Secondary | ICD-10-CM | POA: Insufficient documentation

## 2024-03-26 DIAGNOSIS — E103492 Type 1 diabetes mellitus with severe nonproliferative diabetic retinopathy without macular edema, left eye: Secondary | ICD-10-CM | POA: Diagnosis not present

## 2024-03-26 DIAGNOSIS — E103511 Type 1 diabetes mellitus with proliferative diabetic retinopathy with macular edema, right eye: Secondary | ICD-10-CM | POA: Diagnosis not present

## 2024-04-05 DIAGNOSIS — E1022 Type 1 diabetes mellitus with diabetic chronic kidney disease: Secondary | ICD-10-CM | POA: Diagnosis not present

## 2024-04-05 DIAGNOSIS — E669 Obesity, unspecified: Secondary | ICD-10-CM | POA: Diagnosis not present

## 2024-04-05 DIAGNOSIS — N182 Chronic kidney disease, stage 2 (mild): Secondary | ICD-10-CM | POA: Diagnosis not present

## 2024-04-05 DIAGNOSIS — Z794 Long term (current) use of insulin: Secondary | ICD-10-CM | POA: Diagnosis not present

## 2024-04-05 DIAGNOSIS — E785 Hyperlipidemia, unspecified: Secondary | ICD-10-CM | POA: Diagnosis not present

## 2024-04-08 NOTE — Progress Notes (Unsigned)
 Cardiology Office Note   Date:  04/10/2024  ID:  Anthony Rohleder., DOB 1950/09/12, MRN [id number] PCP: Joshua Debby CROME, MD  [facility] Providers Cardiologist:  None Electrophysiologist:  Donnice DELENA Primus, MD   History of Present Illness Anthony Sjogren. is a 73 y.o. male with HTN, DM 1, and palpitations who was referred for evaluation of abnormal ZIO monitor.  Predominant rhythm on Zio was sinus rhythm with multiple short runs of SVT with the longest episode for 1 minute 31 seconds.  Isolated PACs of 3.8%.  Short episodes of NSVT with the longest episode of 13 beats.  Short runs of SVT with stuttering appearance but P waves throughout consistent most with atrial tachycardia.  He is retired and lives with his wife in [place].  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   Today Anthony Daniel presents to clinic with his wife.  He has been asymptomatic with regards to PACs or SVT but has intermittent Apple Watch alerts that he is tachycardic at rest.  ROS: none relevant   Studies Reviewed  ECG review 02/02/24: NSR 75, PR 174, QRS 75, QT/c 398/444 01/31/24: NSR 75, PR 174, QRS 76, QT/c 398/444  Monitor Result date: 02/15/24-02/29/24 HR 46 to 194, average 76. 4 nonsustained VT, longest 13 beats. 254 SVT episodes, longest 91 seconds. Occasional supraventricular ectopy, 3.8% Rare ventricular ectopy. No atrial fibrillation.  Myocardial perfusion  Result date: 05/12/22   LV perfusion is abnormal. There is evidence of ischemia. There is no evidence of infarction. Defect 1: There is a small defect with moderate reduction in uptake present in the apical anteroseptal and apex location(s) that is reversible. There is normal wall motion in the defect area. Consistent with ischemia.   Left ventricular function is normal. Nuclear stress EF: 50 %. The left ventricular ejection fraction is mildly decreased (45-54%). End diastolic cavity size is normal.    Findings are consistent with ischemia. The study is low risk.  TTE Result date: 05/12/22  1. Left ventricular ejection fraction, by estimation, is 60 to 65%. The left ventricle has normal function. The left ventricle has no regional wall motion abnormalities. Left ventricular diastolic parameters are consistent with Grade I diastolic dysfunction (impaired relaxation). 2. Right ventricular systolic function is normal. The right ventricular size is normal. Tricuspid regurgitation signal is inadequate for assessing PA pressure. 3. The mitral valve is normal in structure. No evidence of mitral valve regurgitation. No evidence of mitral stenosis. 4. The aortic valve is tricuspid. Aortic valve regurgitation is not visualized. No aortic stenosis is present. 5. Aortic dilatation noted. There is mild dilatation of the aortic root, measuring 39 mm. 6. The inferior vena cava is normal in size with greater than 50% respiratory variability, suggesting right atrial pressure of 3 mmHg.  Physical Exam VS:  BP 113/66   Pulse 72   Ht 6' 1 (1.854 m)   Wt 250 lb (113.4 kg)   SpO2 98%   BMI 32.98 kg/m       Wt Readings from Last 3 Encounters:  04/10/24 250 lb (113.4 kg)  02/12/24 249 lb (112.9 kg)  10/09/23 245 lb 3.2 oz (111.2 kg)    GEN: Well nourished, well developed in no acute distress CARDIAC: RRR, no murmurs, rubs, gallops RESPIRATORY:  Clear to auscultation without rales, wheezing or rhonchi  ABDOMEN: Soft, non-tender, non-distended EXTREMITIES:  No edema; No deformity   ASSESSMENT AND PLAN Anthony Daniel.  is a 73 y.o. male with HTN, DM 1, and palpitations who was referred for evaluation of abnormal ZIO monitor.    PACs Atrial tachycardia  I reviewed Anthony Daniel monitor with him which showed brief episodes of atrial tachycardia and frequent PACs of 3.8% burden.  He is otherwise asymptomatic with regards to his arrhythmias and had no documented atrial fibrillation or atrial flutter.  At  this time since he is asymptomatic there is no further workup indicated.  I did ask him to think about getting a Kardia mobile intermittently checking his rhythm to make sure that he does not develop atrial fibrillation.  I explained that right now he does not have an indication for anticoagulation since there is been no documented atrial fibrillation or atrial flutter however if he had symptoms and a moderate picked up atrial fibrillation then he would have an indication.  He is agreeable to this.  He is not interested in the medications to reduce his PAC burden as he is otherwise asymptomatic.  Dispo: f/u prn   A total of 30 minutes was spent preparing for the patient, reviewing history, performing exam, document encounter, coordinating care and counseling the patient. 20 minutes was spent with direct patient care.   Signed, Donnice DELENA Primus, MD

## 2024-04-10 ENCOUNTER — Ambulatory Visit: Attending: Cardiology | Admitting: Student in an Organized Health Care Education/Training Program

## 2024-04-10 ENCOUNTER — Encounter: Payer: Self-pay | Admitting: Student in an Organized Health Care Education/Training Program

## 2024-04-10 VITALS — BP 113/66 | HR 72 | Ht 73.0 in | Wt 250.0 lb

## 2024-04-10 DIAGNOSIS — I4719 Other supraventricular tachycardia: Secondary | ICD-10-CM | POA: Insufficient documentation

## 2024-04-10 DIAGNOSIS — I491 Atrial premature depolarization: Secondary | ICD-10-CM | POA: Diagnosis not present

## 2024-04-10 NOTE — Patient Instructions (Signed)
 Medication Instructions:  Your physician recommends that you continue on your current medications as directed. Please refer to the Current Medication list given to you today.  *If you need a refill on your cardiac medications before your next appointment, please call your pharmacy*  Lab Work: None ordered.  If you have labs (blood work) drawn today and your tests are completely normal, you will receive your results only by: MyChart Message (if you have MyChart) OR A paper copy in the mail If you have any lab test that is abnormal or we need to change your treatment, we will call you to review the results.  Testing/Procedures: None ordered.   Follow-Up: At [facility], you and your health needs are our priority.  As part of our continuing mission to provide you with exceptional heart care, our providers are all part of one team.  This team includes your primary Cardiologist (physician) and Advanced Practice Providers or APPs (Physician Assistants and Nurse Practitioners) who all work together to provide you with the care you need, when you need it.  Your next appointment:   Follow up with Dr Almetta as needed

## 2024-05-08 DIAGNOSIS — E103511 Type 1 diabetes mellitus with proliferative diabetic retinopathy with macular edema, right eye: Secondary | ICD-10-CM | POA: Diagnosis not present

## 2024-05-08 DIAGNOSIS — H15102 Unspecified episcleritis, left eye: Secondary | ICD-10-CM | POA: Diagnosis not present

## 2024-05-08 DIAGNOSIS — Z9889 Other specified postprocedural states: Secondary | ICD-10-CM | POA: Diagnosis not present

## 2024-05-08 DIAGNOSIS — E103492 Type 1 diabetes mellitus with severe nonproliferative diabetic retinopathy without macular edema, left eye: Secondary | ICD-10-CM | POA: Diagnosis not present

## 2024-05-10 DIAGNOSIS — Z8546 Personal history of malignant neoplasm of prostate: Secondary | ICD-10-CM | POA: Diagnosis not present

## 2024-05-17 DIAGNOSIS — R350 Frequency of micturition: Secondary | ICD-10-CM | POA: Diagnosis not present

## 2024-05-17 DIAGNOSIS — N200 Calculus of kidney: Secondary | ICD-10-CM | POA: Diagnosis not present

## 2024-05-17 DIAGNOSIS — R3915 Urgency of urination: Secondary | ICD-10-CM | POA: Diagnosis not present

## 2024-05-17 DIAGNOSIS — Z8546 Personal history of malignant neoplasm of prostate: Secondary | ICD-10-CM | POA: Diagnosis not present

## 2024-05-17 DIAGNOSIS — N401 Enlarged prostate with lower urinary tract symptoms: Secondary | ICD-10-CM | POA: Diagnosis not present

## 2024-05-20 DIAGNOSIS — E103492 Type 1 diabetes mellitus with severe nonproliferative diabetic retinopathy without macular edema, left eye: Secondary | ICD-10-CM | POA: Diagnosis not present

## 2024-05-20 DIAGNOSIS — Z9889 Other specified postprocedural states: Secondary | ICD-10-CM | POA: Diagnosis not present

## 2024-05-20 DIAGNOSIS — H15102 Unspecified episcleritis, left eye: Secondary | ICD-10-CM | POA: Diagnosis not present

## 2024-05-20 DIAGNOSIS — H0013 Chalazion right eye, unspecified eyelid: Secondary | ICD-10-CM | POA: Diagnosis not present

## 2024-05-20 DIAGNOSIS — E103511 Type 1 diabetes mellitus with proliferative diabetic retinopathy with macular edema, right eye: Secondary | ICD-10-CM | POA: Diagnosis not present

## 2024-05-21 DIAGNOSIS — H0011 Chalazion right upper eyelid: Secondary | ICD-10-CM | POA: Diagnosis not present

## 2024-05-22 DIAGNOSIS — Z794 Long term (current) use of insulin: Secondary | ICD-10-CM | POA: Diagnosis not present

## 2024-05-22 DIAGNOSIS — E119 Type 2 diabetes mellitus without complications: Secondary | ICD-10-CM | POA: Diagnosis not present

## 2024-06-01 ENCOUNTER — Other Ambulatory Visit: Payer: Self-pay | Admitting: Internal Medicine

## 2024-06-01 DIAGNOSIS — E139 Other specified diabetes mellitus without complications: Secondary | ICD-10-CM

## 2024-06-01 DIAGNOSIS — I10 Essential (primary) hypertension: Secondary | ICD-10-CM

## 2024-06-01 DIAGNOSIS — E785 Hyperlipidemia, unspecified: Secondary | ICD-10-CM

## 2024-06-05 ENCOUNTER — Ambulatory Visit (HOSPITAL_BASED_OUTPATIENT_CLINIC_OR_DEPARTMENT_OTHER)
Admission: RE | Admit: 2024-06-05 | Discharge: 2024-06-05 | Disposition: A | Discharge status: Home / Self Care | Source: Ambulatory Visit | Attending: Acute Care | Admitting: Acute Care

## 2024-06-05 DIAGNOSIS — Z87891 Personal history of nicotine dependence: Secondary | ICD-10-CM | POA: Insufficient documentation

## 2024-06-05 DIAGNOSIS — Z122 Encounter for screening for malignant neoplasm of respiratory organs: Secondary | ICD-10-CM | POA: Insufficient documentation

## 2024-06-05 DIAGNOSIS — F17210 Nicotine dependence, cigarettes, uncomplicated: Secondary | ICD-10-CM | POA: Insufficient documentation

## 2024-06-12 ENCOUNTER — Other Ambulatory Visit: Payer: Self-pay

## 2024-06-12 DIAGNOSIS — Z122 Encounter for screening for malignant neoplasm of respiratory organs: Secondary | ICD-10-CM

## 2024-06-12 DIAGNOSIS — Z87891 Personal history of nicotine dependence: Secondary | ICD-10-CM

## 2024-06-12 DIAGNOSIS — F17210 Nicotine dependence, cigarettes, uncomplicated: Secondary | ICD-10-CM

## 2024-07-02 DIAGNOSIS — H26492 Other secondary cataract, left eye: Secondary | ICD-10-CM | POA: Diagnosis not present

## 2024-07-02 DIAGNOSIS — E113393 Type 2 diabetes mellitus with moderate nonproliferative diabetic retinopathy without macular edema, bilateral: Secondary | ICD-10-CM | POA: Diagnosis not present

## 2024-07-03 DIAGNOSIS — H26492 Other secondary cataract, left eye: Secondary | ICD-10-CM | POA: Diagnosis not present

## 2024-08-09 ENCOUNTER — Other Ambulatory Visit: Payer: Self-pay | Admitting: Internal Medicine

## 2024-08-09 DIAGNOSIS — R82998 Other abnormal findings in urine: Secondary | ICD-10-CM

## 2024-08-09 DIAGNOSIS — N289 Disorder of kidney and ureter, unspecified: Secondary | ICD-10-CM

## 2024-10-09 ENCOUNTER — Ambulatory Visit

## 2024-10-23 ENCOUNTER — Ambulatory Visit
